# Patient Record
Sex: Male | Born: 1955 | Race: Black or African American | Hispanic: No | Marital: Single | Smoking: Never smoker
Health system: Southern US, Community
[De-identification: ages and names within clinical notes are randomized; demographics above are authoritative.]

## PROBLEM LIST (undated history)

## (undated) DIAGNOSIS — K5909 Other constipation: Secondary | ICD-10-CM

## (undated) DIAGNOSIS — F209 Schizophrenia, unspecified: Secondary | ICD-10-CM

## (undated) DIAGNOSIS — N189 Chronic kidney disease, unspecified: Secondary | ICD-10-CM

## (undated) DIAGNOSIS — I639 Cerebral infarction, unspecified: Secondary | ICD-10-CM

## (undated) DIAGNOSIS — D649 Anemia, unspecified: Secondary | ICD-10-CM

## (undated) DIAGNOSIS — E785 Hyperlipidemia, unspecified: Secondary | ICD-10-CM

## (undated) DIAGNOSIS — I10 Essential (primary) hypertension: Secondary | ICD-10-CM

## (undated) DIAGNOSIS — E119 Type 2 diabetes mellitus without complications: Secondary | ICD-10-CM

## (undated) DIAGNOSIS — Z992 Dependence on renal dialysis: Secondary | ICD-10-CM

## (undated) DIAGNOSIS — I619 Nontraumatic intracerebral hemorrhage, unspecified: Secondary | ICD-10-CM

## (undated) HISTORY — PX: VASCULAR SURGERY: SHX849

## (undated) SURGERY — Surgical Case
Anesthesia: *Unknown

---

## 2010-12-08 DIAGNOSIS — I619 Nontraumatic intracerebral hemorrhage, unspecified: Secondary | ICD-10-CM

## 2010-12-08 HISTORY — DX: Nontraumatic intracerebral hemorrhage, unspecified: I61.9

## 2013-04-19 DIAGNOSIS — E1169 Type 2 diabetes mellitus with other specified complication: Secondary | ICD-10-CM | POA: Insufficient documentation

## 2013-04-19 DIAGNOSIS — I10 Essential (primary) hypertension: Secondary | ICD-10-CM | POA: Insufficient documentation

## 2013-04-19 DIAGNOSIS — E785 Hyperlipidemia, unspecified: Secondary | ICD-10-CM | POA: Insufficient documentation

## 2013-04-19 DIAGNOSIS — E119 Type 2 diabetes mellitus without complications: Secondary | ICD-10-CM | POA: Insufficient documentation

## 2013-04-19 DIAGNOSIS — I639 Cerebral infarction, unspecified: Secondary | ICD-10-CM | POA: Insufficient documentation

## 2013-05-12 DIAGNOSIS — D649 Anemia, unspecified: Secondary | ICD-10-CM | POA: Insufficient documentation

## 2013-05-12 DIAGNOSIS — E559 Vitamin D deficiency, unspecified: Secondary | ICD-10-CM | POA: Insufficient documentation

## 2014-08-02 DIAGNOSIS — Z1211 Encounter for screening for malignant neoplasm of colon: Secondary | ICD-10-CM | POA: Insufficient documentation

## 2014-08-25 ENCOUNTER — Ambulatory Visit: Payer: Self-pay | Admitting: Gastroenterology

## 2014-12-13 ENCOUNTER — Ambulatory Visit: Payer: Self-pay | Admitting: Nephrology

## 2014-12-13 LAB — URINALYSIS, COMPLETE
Bacteria: NONE SEEN
Bilirubin,UR: NEGATIVE
Blood: NEGATIVE
Glucose,UR: 50 mg/dL (ref 0–75)
Ketone: NEGATIVE
Leukocyte Esterase: NEGATIVE
Nitrite: NEGATIVE
PH: 6 (ref 4.5–8.0)
Protein: 100
RBC,UR: 1 /HPF (ref 0–5)
Specific Gravity: 1.006 (ref 1.003–1.030)
WBC UR: 1 /HPF (ref 0–5)

## 2014-12-13 LAB — COMPREHENSIVE METABOLIC PANEL
ALT: 21 U/L
ANION GAP: 7 (ref 7–16)
AST: 17 U/L (ref 15–37)
Albumin: 2.5 g/dL — ABNORMAL LOW (ref 3.4–5.0)
Alkaline Phosphatase: 50 U/L
BILIRUBIN TOTAL: 0.2 mg/dL (ref 0.2–1.0)
BUN: 51 mg/dL — ABNORMAL HIGH (ref 7–18)
CHLORIDE: 105 mmol/L (ref 98–107)
CO2: 25 mmol/L (ref 21–32)
CREATININE: 4.61 mg/dL — AB (ref 0.60–1.30)
Calcium, Total: 8.4 mg/dL — ABNORMAL LOW (ref 8.5–10.1)
EGFR (Non-African Amer.): 14 — ABNORMAL LOW
GFR CALC AF AMER: 17 — AB
Glucose: 114 mg/dL — ABNORMAL HIGH (ref 65–99)
Osmolality: 288 (ref 275–301)
POTASSIUM: 4 mmol/L (ref 3.5–5.1)
Sodium: 137 mmol/L (ref 136–145)
Total Protein: 5.8 g/dL — ABNORMAL LOW (ref 6.4–8.2)

## 2014-12-13 LAB — CBC WITH DIFFERENTIAL/PLATELET
BASOS ABS: 0 10*3/uL (ref 0.0–0.1)
BASOS PCT: 0.9 %
Eosinophil #: 0.2 10*3/uL (ref 0.0–0.7)
Eosinophil %: 3.8 %
HCT: 29.1 % — ABNORMAL LOW (ref 40.0–52.0)
HGB: 9.4 g/dL — AB (ref 13.0–18.0)
LYMPHS PCT: 29.4 %
Lymphocyte #: 1.4 10*3/uL (ref 1.0–3.6)
MCH: 28.5 pg (ref 26.0–34.0)
MCHC: 32.2 g/dL (ref 32.0–36.0)
MCV: 88 fL (ref 80–100)
Monocyte #: 0.4 x10 3/mm (ref 0.2–1.0)
Monocyte %: 9.2 %
Neutrophil #: 2.8 10*3/uL (ref 1.4–6.5)
Neutrophil %: 56.7 %
Platelet: 105 10*3/uL — ABNORMAL LOW (ref 150–440)
RBC: 3.29 10*6/uL — ABNORMAL LOW (ref 4.40–5.90)
RDW: 14.8 % — ABNORMAL HIGH (ref 11.5–14.5)
WBC: 4.9 10*3/uL (ref 3.8–10.6)

## 2014-12-13 LAB — PROTIME-INR
INR: 1.1
Prothrombin Time: 14.5 secs (ref 11.5–14.7)

## 2014-12-13 LAB — PROTEIN / CREATININE RATIO, URINE
CREATININE, URINE: 61.2 mg/dL (ref 30.0–125.0)
PROTEIN/CREAT. RATIO: 3660 mg/g{creat} — AB (ref 0–200)
Protein, Random Urine: 224 mg/dL — ABNORMAL HIGH (ref 0–12)

## 2014-12-13 LAB — APTT: ACTIVATED PTT: 27.3 s (ref 23.6–35.9)

## 2014-12-15 ENCOUNTER — Observation Stay: Payer: Self-pay | Admitting: Nephrology

## 2014-12-15 ENCOUNTER — Ambulatory Visit: Payer: Self-pay | Admitting: Nephrology

## 2014-12-15 LAB — CBC WITH DIFFERENTIAL/PLATELET
Basophil #: 0 10*3/uL (ref 0.0–0.1)
Basophil %: 0.9 %
EOS PCT: 4.2 %
Eosinophil #: 0.2 10*3/uL (ref 0.0–0.7)
HCT: 27.3 % — ABNORMAL LOW (ref 40.0–52.0)
HGB: 8.9 g/dL — AB (ref 13.0–18.0)
Lymphocyte #: 1.6 10*3/uL (ref 1.0–3.6)
Lymphocyte %: 36.5 %
MCH: 28.3 pg (ref 26.0–34.0)
MCHC: 32.5 g/dL (ref 32.0–36.0)
MCV: 87 fL (ref 80–100)
MONOS PCT: 6.4 %
Monocyte #: 0.3 x10 3/mm (ref 0.2–1.0)
Neutrophil #: 2.3 10*3/uL (ref 1.4–6.5)
Neutrophil %: 52 %
Platelet: 93 10*3/uL — ABNORMAL LOW (ref 150–440)
RBC: 3.13 10*6/uL — AB (ref 4.40–5.90)
RDW: 14.5 % (ref 11.5–14.5)
WBC: 4.5 10*3/uL (ref 3.8–10.6)

## 2015-01-21 DIAGNOSIS — I10 Essential (primary) hypertension: Secondary | ICD-10-CM

## 2015-01-21 DIAGNOSIS — E119 Type 2 diabetes mellitus without complications: Secondary | ICD-10-CM | POA: Insufficient documentation

## 2015-01-21 DIAGNOSIS — I129 Hypertensive chronic kidney disease with stage 1 through stage 4 chronic kidney disease, or unspecified chronic kidney disease: Secondary | ICD-10-CM | POA: Insufficient documentation

## 2015-01-21 DIAGNOSIS — N184 Chronic kidney disease, stage 4 (severe): Secondary | ICD-10-CM | POA: Insufficient documentation

## 2015-02-09 ENCOUNTER — Ambulatory Visit
Admit: 2015-02-09 | Disposition: A | Discharge status: Home / Self Care | Payer: Self-pay | Attending: Internal Medicine | Admitting: Internal Medicine

## 2015-02-17 DIAGNOSIS — N051 Unspecified nephritic syndrome with focal and segmental glomerular lesions: Secondary | ICD-10-CM | POA: Insufficient documentation

## 2015-02-26 DIAGNOSIS — N051 Unspecified nephritic syndrome with focal and segmental glomerular lesions: Secondary | ICD-10-CM

## 2015-02-27 DIAGNOSIS — E010 Iodine-deficiency related diffuse (endemic) goiter: Secondary | ICD-10-CM

## 2015-02-27 DIAGNOSIS — E049 Nontoxic goiter, unspecified: Secondary | ICD-10-CM | POA: Insufficient documentation

## 2015-02-27 DIAGNOSIS — R682 Dry mouth, unspecified: Secondary | ICD-10-CM | POA: Insufficient documentation

## 2015-03-06 ENCOUNTER — Ambulatory Visit: Admit: 2015-03-06 | Disposition: A | Discharge status: Home / Self Care | Payer: Self-pay | Admitting: Family Medicine

## 2015-03-06 ENCOUNTER — Ambulatory Visit: Payer: Self-pay | Admitting: Neurology

## 2015-03-09 ENCOUNTER — Ambulatory Visit
Admit: 2015-03-09 | Disposition: A | Discharge status: Home / Self Care | Payer: Self-pay | Attending: Internal Medicine | Admitting: Internal Medicine

## 2015-03-21 ENCOUNTER — Ambulatory Visit
Admit: 2015-03-21 | Disposition: A | Discharge status: Home / Self Care | Payer: Self-pay | Attending: Vascular Surgery | Admitting: Vascular Surgery

## 2015-03-21 DIAGNOSIS — I10 Essential (primary) hypertension: Secondary | ICD-10-CM | POA: Diagnosis not present

## 2015-03-21 LAB — CBC
HCT: 24.8 % — ABNORMAL LOW (ref 40.0–52.0)
HGB: 8 g/dL — ABNORMAL LOW (ref 13.0–18.0)
MCH: 29.1 pg (ref 26.0–34.0)
MCHC: 32.4 g/dL (ref 32.0–36.0)
MCV: 90 fL (ref 80–100)
Platelet: 93 10*3/uL — ABNORMAL LOW (ref 150–440)
RBC: 2.76 10*6/uL — ABNORMAL LOW (ref 4.40–5.90)
RDW: 14.3 % (ref 11.5–14.5)
WBC: 5.3 10*3/uL (ref 3.8–10.6)

## 2015-03-21 LAB — BASIC METABOLIC PANEL
ANION GAP: 9 (ref 7–16)
BUN: 64 mg/dL — ABNORMAL HIGH
CO2: 36 mmol/L — AB
Calcium, Total: 8.5 mg/dL — ABNORMAL LOW
Chloride: 95 mmol/L — ABNORMAL LOW
Creatinine: 7.71 mg/dL — ABNORMAL HIGH
EGFR (African American): 8 — ABNORMAL LOW
EGFR (Non-African Amer.): 7 — ABNORMAL LOW
Glucose: 117 mg/dL — ABNORMAL HIGH
Potassium: 4 mmol/L
Sodium: 140 mmol/L

## 2015-03-29 ENCOUNTER — Ambulatory Visit
Admit: 2015-03-29 | Disposition: A | Discharge status: Home / Self Care | Payer: Self-pay | Attending: Vascular Surgery | Admitting: Vascular Surgery

## 2015-04-02 LAB — SURGICAL PATHOLOGY

## 2015-04-08 NOTE — Op Note (Signed)
PATIENT NAME:  Lawrence [name], GROSSE MR#:  [id number] DATE OF BIRTH:  [birth year]  DATE OF PROCEDURE:  03/29/2015  PREOPERATIVE DIAGNOSES:  1. 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  2. Diabetes.  3. Hypertension.  4. History of stroke.   POSTOPERATIVE DIAGNOSES: 1. 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  2. Diabetes.  3. Hypertension.  4. History of stroke.   PROCEDURE:  Left brachiocephalic arteriovenous fistula creation.   SURGEON:  Algernon Huxley, MD.   ANESTHESIA:  General.   ESTIMATED BLOOD LOSS:  25 mL.   INDICATION FOR PROCEDURE:  This is a gentleman with stage V chronic kidney disease nearing dialysis dependence.  He was referred to our office.  He was found to have adequate vein at the antecubital fossa on his nondominant arm to create an AV fistula.  Risks and benefits were discussed.  Informed consent was obtained.   DESCRIPTION OF PROCEDURE:  The patient was brought to the operative suite, and after an adequate level of general anesthesia was obtained, the left upper extremity was sterilely prepped and draped and a sterile surgical field was created.  A curvilinear incision was created at the antecubital fossa.  He had a known high brachial bifurcation from his preoperative noninvasive studies.  Both arteries were similar size.  The more superficial artery was slightly larger and would be easier to create a fistula with, so we used this and it was impossible to tell from our dissection if this was the radial or ulnar artery but this was at the antecubital location.  The cephalic vein was dissected out.  This was actually quite lateral and had to be dissected several centimeters proximal with several vein branches ligated and divided between silk ties to help facilitate its exposure.  It also had to be dissected several centimeters more distal.  This was done with the help of the Senn retractor both proximally and distally to dissect out several  more centimeters of vein.  It was marked for orientation.  The patient was systemically heparinized.  It was ligated distally and a bulldog clamp was used for control.  It was flushed with heparinized saline.  Control was then pulled up on the vessel loops.  An antral arteriotomy was created with an 11 blade and extended with Potts scissors.  The anastomosis was then created with a running 6-0 Prolene suture in the usual fashion after cutting and beveling the vein to an appropriate size to match the arteriotomy.  Anastomosis required two 6-0 Prolene patch sutures for hemostasis and hemostasis was achieved.  There was some vasospasms, and so we used topical papaverine to help break the spasm resulting in a good thrill within the AV fistula. The wound was irrigated.  Surgicel and Evicel topical hemostatic agents were placed.  Hemostasis was complete.  The wound was then closed with 3-0 Vicryl and 4-0 Monocryl and Dermabond was placed as a dressing.  The patient was awakened from anesthesia and taken to the recovery room in stable condition, having tolerated the procedure well.    ____________________________ Algernon Huxley, MD jsd:kc D: 03/29/2015 15:58:19 ET T: 03/29/2015 16:33:47 ET JOB#: [id number]  cc: Algernon Huxley, MD, <Dictator> Algernon Huxley MD ELECTRONICALLY SIGNED 04/04/2015 13:52

## 2015-04-16 ENCOUNTER — Encounter: Payer: Self-pay | Admitting: *Deleted

## 2015-04-16 ENCOUNTER — Inpatient Hospital Stay
Admission: AD | Admit: 2015-04-16 | Discharge: 2015-04-23 | DRG: 682 | Disposition: A | Discharge status: Home / Self Care | Payer: Medicare Other | Source: Ambulatory Visit | Attending: Internal Medicine | Admitting: Internal Medicine

## 2015-04-16 DIAGNOSIS — E1121 Type 2 diabetes mellitus with diabetic nephropathy: Secondary | ICD-10-CM | POA: Diagnosis present

## 2015-04-16 DIAGNOSIS — J449 Chronic obstructive pulmonary disease, unspecified: Secondary | ICD-10-CM | POA: Diagnosis present

## 2015-04-16 DIAGNOSIS — I10 Essential (primary) hypertension: Secondary | ICD-10-CM | POA: Diagnosis present

## 2015-04-16 DIAGNOSIS — D631 Anemia in chronic kidney disease: Secondary | ICD-10-CM | POA: Diagnosis present

## 2015-04-16 DIAGNOSIS — N2581 Secondary hyperparathyroidism of renal origin: Secondary | ICD-10-CM | POA: Diagnosis present

## 2015-04-16 DIAGNOSIS — E559 Vitamin D deficiency, unspecified: Secondary | ICD-10-CM | POA: Diagnosis present

## 2015-04-16 DIAGNOSIS — I120 Hypertensive chronic kidney disease with stage 5 chronic kidney disease or end stage renal disease: Principal | ICD-10-CM | POA: Diagnosis present

## 2015-04-16 DIAGNOSIS — Z992 Dependence on renal dialysis: Secondary | ICD-10-CM

## 2015-04-16 DIAGNOSIS — Y92230 Patient room in hospital as the place of occurrence of the external cause: Secondary | ICD-10-CM

## 2015-04-16 DIAGNOSIS — Z833 Family history of diabetes mellitus: Secondary | ICD-10-CM

## 2015-04-16 DIAGNOSIS — N186 End stage renal disease: Secondary | ICD-10-CM | POA: Diagnosis present

## 2015-04-16 DIAGNOSIS — Z823 Family history of stroke: Secondary | ICD-10-CM

## 2015-04-16 DIAGNOSIS — E785 Hyperlipidemia, unspecified: Secondary | ICD-10-CM | POA: Diagnosis present

## 2015-04-16 DIAGNOSIS — Y838 Other surgical procedures as the cause of abnormal reaction of the patient, or of later complication, without mention of misadventure at the time of the procedure: Secondary | ICD-10-CM | POA: Diagnosis not present

## 2015-04-16 DIAGNOSIS — E1122 Type 2 diabetes mellitus with diabetic chronic kidney disease: Secondary | ICD-10-CM | POA: Diagnosis present

## 2015-04-16 DIAGNOSIS — Z8673 Personal history of transient ischemic attack (TIA), and cerebral infarction without residual deficits: Secondary | ICD-10-CM

## 2015-04-16 DIAGNOSIS — F209 Schizophrenia, unspecified: Secondary | ICD-10-CM | POA: Diagnosis present

## 2015-04-16 DIAGNOSIS — Z8249 Family history of ischemic heart disease and other diseases of the circulatory system: Secondary | ICD-10-CM

## 2015-04-16 DIAGNOSIS — I97618 Postprocedural hemorrhage and hematoma of a circulatory system organ or structure following other circulatory system procedure: Secondary | ICD-10-CM | POA: Diagnosis not present

## 2015-04-16 HISTORY — DX: Nontraumatic intracerebral hemorrhage, unspecified: I61.9

## 2015-04-16 HISTORY — DX: Hyperlipidemia, unspecified: E78.5

## 2015-04-16 HISTORY — DX: Chronic kidney disease, unspecified: N18.9

## 2015-04-16 HISTORY — DX: Anemia, unspecified: D64.9

## 2015-04-16 HISTORY — DX: Cerebral infarction, unspecified: I63.9

## 2015-04-16 HISTORY — DX: Type 2 diabetes mellitus without complications: E11.9

## 2015-04-16 HISTORY — DX: Essential (primary) hypertension: I10

## 2015-04-16 HISTORY — DX: Schizophrenia, unspecified: F20.9

## 2015-04-16 LAB — BASIC METABOLIC PANEL
ANION GAP: 9 (ref 5–15)
BUN: 87 mg/dL — ABNORMAL HIGH (ref 6–20)
CALCIUM: 8.7 mg/dL — AB (ref 8.9–10.3)
CO2: 24 mmol/L (ref 22–32)
Chloride: 105 mmol/L (ref 101–111)
Creatinine, Ser: 9.66 mg/dL — ABNORMAL HIGH (ref 0.61–1.24)
GFR calc non Af Amer: 5 mL/min — ABNORMAL LOW (ref >60)
GFR, EST AFRICAN AMERICAN: 6 mL/min — AB (ref >60)
Glucose, Bld: 154 mg/dL — ABNORMAL HIGH (ref 65–99)
Potassium: 4.1 mmol/L (ref 3.5–5.1)
Sodium: 138 mmol/L (ref 135–145)

## 2015-04-16 LAB — CBC
HCT: 23.2 % — ABNORMAL LOW (ref 40.0–52.0)
Hemoglobin: 7.8 g/dL — ABNORMAL LOW (ref 13.0–18.0)
MCH: 30.5 pg (ref 26.0–34.0)
MCHC: 33.6 g/dL (ref 32.0–36.0)
MCV: 90.6 fL (ref 80.0–100.0)
PLATELETS: 110 10*3/uL — AB (ref 150–440)
RBC: 2.56 MIL/uL — ABNORMAL LOW (ref 4.40–5.90)
RDW: 15.2 % — AB (ref 11.5–14.5)
WBC: 4.1 10*3/uL (ref 3.8–10.6)

## 2015-04-16 MED ORDER — NIACIN ER (ANTIHYPERLIPIDEMIC) 1000 MG PO TBCR
1000.0000 mg | EXTENDED_RELEASE_TABLET | Freq: Every day | ORAL | Status: DC
Start: 2015-04-16 — End: 2015-04-16
  Filled 2015-04-16 (×3): qty 1

## 2015-04-16 MED ORDER — LORATADINE 10 MG PO TABS
10.0000 mg | ORAL_TABLET | Freq: Every day | ORAL | Status: DC
  Administered 2015-04-16 – 2015-04-22 (×7): 10 mg via ORAL
  Filled 2015-04-16 (×7): qty 1

## 2015-04-16 MED ORDER — CYCLOBENZAPRINE HCL 10 MG PO TABS: 10.0000 mg | ORAL_TABLET | Freq: Three times a day (TID) | ORAL | Status: DC | PRN

## 2015-04-16 MED ORDER — ALBUTEROL SULFATE (2.5 MG/3ML) 0.083% IN NEBU: 2.5000 mg | INHALATION_SOLUTION | RESPIRATORY_TRACT | Status: DC | PRN

## 2015-04-16 MED ORDER — ONDANSETRON HCL 4 MG/2ML IJ SOLN
4.0000 mg | Freq: Four times a day (QID) | INTRAMUSCULAR | Status: DC | PRN
  Administered 2015-04-17: 4 mg via INTRAVENOUS
  Filled 2015-04-16: qty 2

## 2015-04-16 MED ORDER — HEPARIN SODIUM (PORCINE) 5000 UNIT/ML IJ SOLN
5000.0000 [IU] | Freq: Three times a day (TID) | INTRAMUSCULAR | Status: DC
  Administered 2015-04-16 – 2015-04-17 (×2): 5000 [IU] via SUBCUTANEOUS
  Filled 2015-04-16 (×3): qty 1

## 2015-04-16 MED ORDER — ISOSORBIDE MONONITRATE ER 60 MG PO TB24
90.0000 mg | ORAL_TABLET | Freq: Every day | ORAL | Status: DC
  Administered 2015-04-16 – 2015-04-23 (×8): 90 mg via ORAL
  Filled 2015-04-16 (×8): qty 1

## 2015-04-16 MED ORDER — ONDANSETRON HCL 4 MG PO TABS
4.0000 mg | ORAL_TABLET | Freq: Four times a day (QID) | ORAL | Status: DC | PRN
  Filled 2015-04-16: qty 1

## 2015-04-16 MED ORDER — DOCUSATE SODIUM 100 MG PO CAPS
100.0000 mg | ORAL_CAPSULE | Freq: Two times a day (BID) | ORAL | Status: DC
  Administered 2015-04-16 – 2015-04-23 (×14): 100 mg via ORAL
  Filled 2015-04-16 (×14): qty 1

## 2015-04-16 MED ORDER — BENZTROPINE MESYLATE 1 MG PO TABS
0.5000 mg | ORAL_TABLET | Freq: Two times a day (BID) | ORAL | Status: DC
  Administered 2015-04-16 – 2015-04-23 (×14): 0.5 mg via ORAL
  Filled 2015-04-16 (×13): qty 1

## 2015-04-16 MED ORDER — NIACIN ER (ANTIHYPERLIPIDEMIC) 500 MG PO TBCR
1000.0000 mg | EXTENDED_RELEASE_TABLET | Freq: Every day | ORAL | Status: DC
  Administered 2015-04-16 – 2015-04-22 (×5): 1000 mg via ORAL
  Filled 2015-04-16 (×14): qty 2

## 2015-04-16 MED ORDER — CLONIDINE HCL 0.1 MG PO TABS
0.3000 mg | ORAL_TABLET | Freq: Two times a day (BID) | ORAL | Status: DC
  Administered 2015-04-16: 0.3 mg via ORAL
  Filled 2015-04-16: qty 3

## 2015-04-16 MED ORDER — ASPIRIN EC 81 MG PO TBEC
81.0000 mg | DELAYED_RELEASE_TABLET | Freq: Every day | ORAL | Status: DC
  Administered 2015-04-17 – 2015-04-23 (×7): 81 mg via ORAL
  Filled 2015-04-16 (×7): qty 1

## 2015-04-16 MED ORDER — ACETAMINOPHEN 325 MG PO TABS: 650.0000 mg | ORAL_TABLET | Freq: Four times a day (QID) | ORAL | Status: DC | PRN

## 2015-04-16 MED ORDER — CARVEDILOL 25 MG PO TABS
25.0000 mg | ORAL_TABLET | Freq: Two times a day (BID) | ORAL | Status: DC
  Administered 2015-04-17 – 2015-04-23 (×10): 25 mg via ORAL
  Filled 2015-04-16 (×12): qty 1

## 2015-04-16 MED ORDER — BENZONATATE 100 MG PO CAPS
100.0000 mg | ORAL_CAPSULE | Freq: Three times a day (TID) | ORAL | Status: DC | PRN
  Administered 2015-04-21: 100 mg via ORAL
  Filled 2015-04-16 (×2): qty 1

## 2015-04-16 MED ORDER — ACETAMINOPHEN 650 MG RE SUPP: 650.0000 mg | Freq: Four times a day (QID) | RECTAL | Status: DC | PRN

## 2015-04-16 MED ORDER — HYDROCODONE-ACETAMINOPHEN 5-325 MG PO TABS
1.0000 | ORAL_TABLET | ORAL | Status: DC | PRN
  Administered 2015-04-17: 1 via ORAL
  Filled 2015-04-16: qty 1

## 2015-04-16 MED ORDER — HYDRALAZINE HCL 100 MG PO TABS
150.0000 mg | ORAL_TABLET | Freq: Two times a day (BID) | ORAL | Status: DC
  Administered 2015-04-16: 150 mg via ORAL
  Filled 2015-04-16 (×3): qty 1.5

## 2015-04-16 NOTE — Progress Notes (Signed)
  [facility]  ROUNDING NOTE   Subjective:   Patient known to our practice from outpatient Admitted for permcath placement and to initiate dialysis Eating dinner when seen   Objective:  Vital signs in last 24 hours:  Temp:  [98.1 F (36.7 C)] 98.1 F (36.7 C) (05/09 1806) Pulse Rate:  [88] 88 (05/09 1806) Resp:  [18] 18 (05/09 1721) BP: (154)/(79) 154/79 mmHg (05/09 1721) SpO2:  [100 %] 100 % (05/09 1806) Weight:  [82.146 kg (181 lb 1.6 oz)] 82.146 kg (181 lb 1.6 oz) (05/09 1805)  Weight change:  Filed Weights   04/16/15 1805  Weight: 82.146 kg (181 lb 1.6 oz)    Intake/Output:     Intake/Output this shift:     Physical Exam: General: NAD,   Head: Normocephalic, atraumatic. Moist oral mucosal membranes  Eyes: Anicteric,  Neck: Supple, trachea midline  Lungs:  Clear to auscultation  Heart: Regular rate and rhythm  Abdomen:  Soft, nontender,   Extremities:  no peripheral edema.  Neurologic: Nonfocal, moving all four extremities  Skin: No lesions  Access: None, developing AVF    Basic Metabolic Panel:  Recent Labs Lab 04/16/15 1755  NA 138  K 4.1  CL 105  CO2 24  GLUCOSE 154*  BUN 87*  CREATININE 9.66*  CALCIUM 8.7*    Liver Function Tests: No results for input(s): AST, ALT, ALKPHOS, BILITOT, PROT, ALBUMIN in the last 168 hours. No results for input(s): LIPASE, AMYLASE in the last 168 hours. No results for input(s): AMMONIA in the last 168 hours.  CBC:  Recent Labs Lab 04/16/15 1755  WBC 4.1  HGB 7.8*  HCT 23.2*  MCV 90.6  PLT 110*    Cardiac Enzymes: No results for input(s): CKTOTAL, CKMB, CKMBINDEX, TROPONINI in the last 168 hours.  BNP: Invalid input(s): POCBNP  CBG: No results for input(s): GLUCAP in the last 168 hours.  Microbiology: No results found for this or any previous visit.  Coagulation Studies: No results for input(s): LABPROT, INR in the last 72 hours.  Urinalysis: No results for input(s):  COLORURINE, LABSPEC, PHURINE, GLUCOSEU, HGBUR, BILIRUBINUR, KETONESUR, PROTEINUR, UROBILINOGEN, NITRITE, LEUKOCYTESUR in the last 72 hours.  Invalid input(s): APPERANCEUR    Imaging: No results found.   Medications:     . [START ON 04/17/2015] aspirin EC  81 mg Oral Daily  . benztropine  0.5 mg Oral BID  . [START ON 04/17/2015] carvedilol  25 mg Oral BID WC  . cloNIDine  0.3 mg Oral BID  . docusate sodium  100 mg Oral BID  . heparin  5,000 Units Subcutaneous 3 times per day  . hydrALAZINE  150 mg Oral BID  . isosorbide mononitrate  90 mg Oral Daily  . loratadine  10 mg Oral QHS  . niacin  1,000 mg Oral QHS   acetaminophen **OR** acetaminophen, albuterol, benzonatate, cyclobenzaprine, HYDROcodone-acetaminophen, ondansetron **OR** ondansetron (ZOFRAN) IV  Assessment/ Plan:  59 year old African American male with past medical history of diabetes mellitus, hypertension, history of CVA, hyperlipidemia, chronic kidney disease stage IV, anemia chronic kidney disease, vitamin D deficiency, and chronic schizoaffective disorder   1. ESRD 2. Anemia of CKD.  3. Proteinuria. 4, Hypertension. 5. 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 Secondary hyperparathyroidism.  NPO in AM in case there is opening for Permcath placement This was discussed with patient and the caretaker in the room Initiate dialysis once access is available   LOS: 0 Lawrence [name] 5/9/201610:00 PM

## 2015-04-16 NOTE — Care Management Note (Signed)
Based on Dr. Doy Hutching recommendation, Dr. Bridgett Larsson to change order from Inpatient to observation.  Medicare observation letter Code 44 given and reviewed with patient

## 2015-04-16 NOTE — H&P (Signed)
[facility] [place] NAME: Lawrence [name]    MR#:  [id number]  DATE OF BIRTH:  17-Jan-1956  DATE OF ADMISSION:  04/16/2015  PRIMARY CARE PHYSICIAN: Sharyne Peach, MD   REQUESTING/REFERRING PHYSICIAN: Dr. Holley Raring.  CHIEF COMPLAINT:  No chief complaint on file.  ESRD need new hemodialysis  HISTORY OF PRESENT ILLNESS:  Lawrence [name]  is a 59 y.o. male with a known history of hypertension, diabetes, CK D and stroke. The patient was from assisted-living sent to the hospital for direct admission by Dr. Holley Raring due to COPD progressing to ESRD and the need to [facility] dialysis. Patient only complains of a decreased appetite and mild weakness. He denies any other symptoms.  PAST MEDICAL HISTORY:   Past Medical History  Diagnosis Date  . Schizophrenia   . Cerebral hemorrhage 2012  . Stroke   . Diabetes mellitus without complication   . Hypertension   . Chronic kidney disease   . Anemia   . Hyperlipidemia     PAST SURGICAL HISTORY:   Past Surgical History  Procedure Laterality Date  . Vascular surgery      Fistula placement    SOCIAL HISTORY:   History  Substance Use Topics  . Smoking status: Never Smoker   . Smokeless tobacco: Never Used  . Alcohol Use: No    FAMILY HISTORY:   Family History  Problem Relation Age of Onset  . Diabetes Mother   . Kidney cancer Mother   . Diabetes Sister   . Stroke Brother     DRUG ALLERGIES:  No Known Allergies  REVIEW OF SYSTEMS:  CONSTITUTIONAL: No fever, decreased appetite and a mild weakness.  EYES: No blurred or double vision.  EARS, NOSE, AND THROAT: No tinnitus or ear pain.  RESPIRATORY: No cough, shortness of breath, wheezing or hemoptysis.  CARDIOVASCULAR: No chest pain, orthopnea, edema.  GASTROINTESTINAL: No nausea, vomiting, diarrhea or abdominal pain.  GENITOURINARY: No dysuria, hematuria.  ENDOCRINE: No polyuria, nocturia,  HEMATOLOGY: No anemia, easy bruising or  bleeding SKIN: No rash or lesion. MUSCULOSKELETAL: No joint pain or arthritis.   NEUROLOGIC: No tingling, numbness, weakness.  PSYCHIATRY: No anxiety or depression.   MEDICATIONS AT HOME:   Prior to Admission medications   Not on File      VITAL SIGNS:  Blood pressure 154/79, pulse 88, temperature 98.1 F (36.7 C), temperature source Oral, resp. rate 18, height 5\' 9"  (1.753 m), weight 82.146 kg (181 lb 1.6 oz), SpO2 100 %.  PHYSICAL EXAMINATION:  GENERAL:  59 y.o.-year-old patient lying in the bed with no acute distress.  EYES: Pupils equal, round, reactive to light and accommodation. No scleral icterus. Extraocular muscles intact.  HEENT: Head atraumatic, normocephalic. Oropharynx and nasopharynx clear.  NECK:  Supple, no jugular venous distention. No thyroid enlargement, no tenderness.  LUNGS: Normal breath sounds bilaterally, no wheezing, rales,rhonchi or crepitation. No use of accessory muscles of respiration.  CARDIOVASCULAR: S1, S2 normal. No murmurs, rubs, or gallops.  ABDOMEN: Soft, nontender, nondistended. Bowel sounds present. No organomegaly or mass.  EXTREMITIES: No pedal edema, cyanosis, or clubbing.  NEUROLOGIC: Cranial nerves II through XII are intact. Muscle strength 5/5 in all extremities. Sensation intact. Gait not checked.  PSYCHIATRIC: The patient is alert and oriented x 3.  SKIN: No obvious rash, lesion, or ulcer.   LABORATORY PANEL:   CBC  Recent Labs Lab 04/16/15 1755  WBC 4.1  HGB 7.8*  HCT 23.2*  PLT 110*   ------------------------------------------------------------------------------------------------------------------  Chemistries  No results for input(s): NA, K, CL, CO2, GLUCOSE, BUN, CREATININE, CALCIUM, MG, AST, ALT, ALKPHOS, BILITOT in the last 168 hours.  Invalid input(s): GFRCGP ------------------------------------------------------------------------------------------------------------------  Cardiac Enzymes No results for input(s):  TROPONINI in the last 168 hours. ------------------------------------------------------------------------------------------------------------------  RADIOLOGY:  No results found.  EKG:  No orders found for this or any previous visit.  IMPRESSION AND PLAN:  I will place patient for observation. ESRD. Follow up with nephrologist and vascular surgeon for Perma-Caths and hemodialysis. Hypertension. Continue patient to home hypertension medications. Diabetes. I will start a sliding scale, but hold patient to by mouth diabetes medications. Anemia of chronic disease. Follow-up labs hemoglobin. May need to have routine during hemodialysis. Thrombocytopenia. Unclear ideology, follow-up CBC.     All the records are reviewed and case discussed with Dr. Holley Raring and Dr. Candiss Norse.  Management plans discussed with the patient, assist in relieving director and they are in agreement.  CODE STATUS: Full code.  TOTAL TIME TAKING CARE OF THIS PATIENT: 53 minutes.    Demetrios Loll M.D on 04/16/2015 at 6:11 PM  Between 7am to 6pm - Pager - [phone]  After 6pm go to [email or web address] - password [email or web address]  Office  [phone]  CC: Primary care physician; Sharyne Peach, MD

## 2015-04-17 ENCOUNTER — Encounter: Payer: Self-pay | Admitting: *Deleted

## 2015-04-17 ENCOUNTER — Encounter
Admission: AD | Disposition: A | Discharge status: Home / Self Care | Payer: Medicare Other | Source: Ambulatory Visit | Attending: Internal Medicine

## 2015-04-17 HISTORY — PX: PERIPHERAL VASCULAR CATHETERIZATION: SHX172C

## 2015-04-17 LAB — CBC
HCT: 22.2 % — ABNORMAL LOW (ref 40.0–52.0)
HCT: 21.4 % — ABNORMAL LOW (ref 40.0–52.0)
Hemoglobin: 7.3 g/dL — ABNORMAL LOW (ref 13.0–18.0)
Hemoglobin: 7.2 g/dL — ABNORMAL LOW (ref 13.0–18.0)
MCH: 29.9 pg (ref 26.0–34.0)
MCH: 30.5 pg (ref 26.0–34.0)
MCHC: 33 g/dL (ref 32.0–36.0)
MCHC: 33.5 g/dL (ref 32.0–36.0)
MCV: 90.7 fL (ref 80.0–100.0)
MCV: 91.1 fL (ref 80.0–100.0)
PLATELETS: 101 10*3/uL — AB (ref 150–440)
Platelets: 101 10*3/uL — ABNORMAL LOW (ref 150–440)
RBC: 2.45 MIL/uL — AB (ref 4.40–5.90)
RBC: 2.35 MIL/uL — AB (ref 4.40–5.90)
RDW: 15.5 % — AB (ref 11.5–14.5)
RDW: 15.3 % — ABNORMAL HIGH (ref 11.5–14.5)
WBC: 4.3 10*3/uL (ref 3.8–10.6)
WBC: 4 10*3/uL (ref 3.8–10.6)

## 2015-04-17 LAB — BASIC METABOLIC PANEL
ANION GAP: 11 (ref 5–15)
BUN: 86 mg/dL — ABNORMAL HIGH (ref 6–20)
CALCIUM: 8.7 mg/dL — AB (ref 8.9–10.3)
CO2: 23 mmol/L (ref 22–32)
Chloride: 106 mmol/L (ref 101–111)
Creatinine, Ser: 9.5 mg/dL — ABNORMAL HIGH (ref 0.61–1.24)
GFR calc non Af Amer: 5 mL/min — ABNORMAL LOW (ref >60)
GFR, EST AFRICAN AMERICAN: 6 mL/min — AB (ref >60)
Glucose, Bld: 92 mg/dL (ref 65–99)
Potassium: 4.7 mmol/L (ref 3.5–5.1)
Sodium: 140 mmol/L (ref 135–145)

## 2015-04-17 LAB — MRSA PCR SCREENING: MRSA by PCR: NEGATIVE

## 2015-04-17 LAB — GLUCOSE, CAPILLARY
GLUCOSE-CAPILLARY: 98 mg/dL (ref 70–99)
Glucose-Capillary: 160 mg/dL — ABNORMAL HIGH (ref 70–99)
Glucose-Capillary: 166 mg/dL — ABNORMAL HIGH (ref 70–99)
Glucose-Capillary: 99 mg/dL (ref 70–99)

## 2015-04-17 SURGERY — DIALYSIS/PERMA CATHETER INSERTION
Anesthesia: Moderate Sedation

## 2015-04-17 MED ORDER — HEPARIN (PORCINE) IN NACL 2-0.9 UNIT/ML-% IJ SOLN
INTRAMUSCULAR | Status: AC
  Filled 2015-04-17: qty 500

## 2015-04-17 MED ORDER — MORPHINE SULFATE 2 MG/ML IJ SOLN
2.0000 mg | Freq: Once | INTRAMUSCULAR | Status: AC
  Administered 2015-04-17: 2 mg via INTRAVENOUS
  Filled 2015-04-17: qty 1

## 2015-04-17 MED ORDER — CLONIDINE HCL 0.1 MG PO TABS
0.1000 mg | ORAL_TABLET | Freq: Two times a day (BID) | ORAL | Status: DC
  Administered 2015-04-17 – 2015-04-22 (×11): 0.1 mg via ORAL
  Filled 2015-04-17 (×11): qty 1

## 2015-04-17 MED ORDER — LIDOCAINE HCL (PF) 1 % IJ SOLN
INTRAMUSCULAR | Status: AC
  Filled 2015-04-17: qty 10

## 2015-04-17 MED ORDER — CEFAZOLIN SODIUM 1-5 GM-% IV SOLN
INTRAVENOUS | Status: DC | PRN
  Administered 2015-04-17: 1 g via INTRAVENOUS

## 2015-04-17 MED ORDER — LIDOCAINE-EPINEPHRINE (PF) 1 %-1:200000 IJ SOLN
INTRAMUSCULAR | Status: AC
  Filled 2015-04-17: qty 30

## 2015-04-17 MED ORDER — INSULIN ASPART 100 UNIT/ML ~~LOC~~ SOLN
0.0000 [IU] | Freq: Three times a day (TID) | SUBCUTANEOUS | Status: DC
  Administered 2015-04-18: 3 [IU] via SUBCUTANEOUS
  Administered 2015-04-18: 1 [IU] via SUBCUTANEOUS
  Administered 2015-04-19: 2 [IU] via SUBCUTANEOUS
  Administered 2015-04-21 (×2): 1 [IU] via SUBCUTANEOUS
  Administered 2015-04-22: 2 [IU] via SUBCUTANEOUS
  Administered 2015-04-23: 1 [IU] via SUBCUTANEOUS
  Filled 2015-04-17: qty 1
  Filled 2015-04-17: qty 2
  Filled 2015-04-17: qty 1
  Filled 2015-04-17: qty 2
  Filled 2015-04-17: qty 3
  Filled 2015-04-17: qty 2

## 2015-04-17 MED ORDER — MIDAZOLAM HCL 2 MG/2ML IJ SOLN
INTRAMUSCULAR | Status: AC
  Filled 2015-04-17: qty 2

## 2015-04-17 MED ORDER — MIDAZOLAM HCL 5 MG/5ML IJ SOLN
INTRAMUSCULAR | Status: AC
  Filled 2015-04-17: qty 5

## 2015-04-17 MED ORDER — HYDRALAZINE HCL 50 MG PO TABS
50.0000 mg | ORAL_TABLET | Freq: Two times a day (BID) | ORAL | Status: DC
  Administered 2015-04-17 – 2015-04-20 (×7): 50 mg via ORAL
  Filled 2015-04-17 (×6): qty 1

## 2015-04-17 MED ORDER — HEPARIN SODIUM (PORCINE) 10000 UNIT/ML IJ SOLN
INTRAMUSCULAR | Status: AC
  Filled 2015-04-17: qty 1

## 2015-04-17 MED ORDER — SODIUM CHLORIDE 0.9 % IV SOLN
INTRAVENOUS | Status: DC
  Administered 2015-04-17 – 2015-04-21 (×2): via INTRAVENOUS

## 2015-04-17 MED ORDER — SODIUM CHLORIDE 0.9 % IV SOLN
20.0000 ug | Freq: Once | INTRAVENOUS | Status: AC
  Administered 2015-04-17: 20 ug via INTRAVENOUS
  Filled 2015-04-17: qty 5

## 2015-04-17 MED ORDER — CEFAZOLIN SODIUM 1-5 GM-% IV SOLN
1.0000 g | Freq: Once | INTRAVENOUS | Status: AC
  Administered 2015-04-17: 1 g via INTRAVENOUS
  Filled 2015-04-17: qty 50

## 2015-04-17 MED ORDER — MIDAZOLAM HCL 2 MG/2ML IJ SOLN
INTRAMUSCULAR | Status: DC | PRN
  Administered 2015-04-17: 2 mg via INTRAVENOUS
  Administered 2015-04-17: 3 mg via INTRAVENOUS
  Administered 2015-04-17: 1 mg via INTRAVENOUS

## 2015-04-17 MED ORDER — CEFAZOLIN SODIUM 1-5 GM-% IV SOLN
INTRAVENOUS | Status: AC
  Filled 2015-04-17: qty 50

## 2015-04-17 MED ORDER — INSULIN ASPART 100 UNIT/ML ~~LOC~~ SOLN: 0.0000 [IU] | Freq: Every day | SUBCUTANEOUS | Status: DC

## 2015-04-17 SURGICAL SUPPLY — 7 items
CATH PALINDROME REV 23CM (CATHETERS) ×3 IMPLANT
DERMABOND ADVANCED (GAUZE/BANDAGES/DRESSINGS) ×2
DERMABOND ADVANCED .7 DNX12 (GAUZE/BANDAGES/DRESSINGS) ×1 IMPLANT
PACK ANGIOGRAPHY (CUSTOM PROCEDURE TRAY) ×3 IMPLANT
SET INTRO CAPELLA COAXIAL (SET/KITS/TRAYS/PACK) ×3 IMPLANT
TOWEL OR 17X26 4PK STRL BLUE (TOWEL DISPOSABLE) ×3 IMPLANT
WIRE AMPLATZ SSTIFF .035X260CM (WIRE) ×3 IMPLANT

## 2015-04-17 NOTE — Op Note (Signed)
OPERATIVE NOTE   PROCEDURE: 1. Insertion of tunneled dialysis catheter right internal jugular approach.  PRE-OPERATIVE DIAGNOSIS: Acute on chronic renal insufficiency  POST-OPERATIVE DIAGNOSIS: Same  SURGEON: Katha Cabal M.D.  ANESTHESIA: Conscious sedation with 1% lidocaine local infiltration  ESTIMATED BLOOD LOSS: Minimal cc  CONTRAST USED:  None  FLUOROSCOPY TIME:    INDICATIONS:   Lawrence [name] is a 59 y.o. male who presents with worsening renal function. Nephrology is following the patient and hemodialysis will now be initiated. He therefore requires appropriate access and given the need for dialysis urgently tunneled catheter will be placed. Patient acknowledges and understands the need for upper extremity access will be worked out at a later date..  DESCRIPTION: After obtaining full informed written consent, the patient was positioned supine. The right neck and chest wall was prepped and draped in a sterile fashion. Ultrasound was placed in a sterile sleeve. Ultrasound was utilized to identify the internal jugular vein which is noted to be echolucent and compressible indicating patency. Images recorded for the permanent record. Under real-time visualization a Seldinger needle is inserted into the vein and the guidewires advanced without difficulty. Small counterincision was made at the wire insertion site. Dilators are passed over the wire and the tunneled dialysis catheter is fed into the central venous system without difficulty.  Under fluoroscopy the catheter tip positioned at the atrial caval junction. The catheter is then approximated to the chest wall and an exit site selected. 1% lidocaine is infiltrated in soft tissues at this level small incision is made and the tunneling device is then passed from the exit site to the neck counterincision. Catheter is then connected to the tunneling device and the catheter was pulled subcutaneously. It is then transected and the hub  assembly connected without difficulty. Both lumens aspirate and flush easily. After verification of smooth contour with proper tip position under fluoroscopy the catheter is packed with 5000 units of heparin per lumen.  Catheter secured to the skin of the chest with 0 silk. A sterile dressing is applied with a Biopatch.  COMPLICATIONS: None  CONDITION: Good  Katha Cabal, M.D. [facility]. Office:  [phone]   04/17/2015, 7:03 PM

## 2015-04-17 NOTE — Progress Notes (Signed)
[facility] [place] NAME: Wadsworth Urbanek    MR#:  [id number]  DATE OF BIRTH:  06-03-1956  SUBJECTIVE:  CHIEF COMPLAINT:  No chief complaint on file.  no complaint. Comfortable  REVIEW OF SYSTEMS:  CONSTITUTIONAL: No fever, fatigue or weakness.  EYES: No blurred or double vision.  EARS, NOSE, AND THROAT: No tinnitus or ear pain.  RESPIRATORY: No cough, shortness of breath, wheezing or hemoptysis.  CARDIOVASCULAR: No chest pain, orthopnea, edema.  GASTROINTESTINAL: No nausea, vomiting, diarrhea or abdominal pain.  GENITOURINARY: No dysuria, hematuria.  ENDOCRINE: No polyuria, nocturia,  HEMATOLOGY: No anemia, easy bruising or bleeding SKIN: No rash or lesion. MUSCULOSKELETAL: No joint pain or arthritis.   NEUROLOGIC: No tingling, numbness, weakness.  PSYCHIATRY: No anxiety or depression.   DRUG ALLERGIES:  No Known Allergies  VITALS:  Blood pressure 141/85, pulse 86, temperature 98.4 F (36.9 C), temperature source Oral, resp. rate 11, height 5\' 9"  (1.753 m), weight 82.146 kg (181 lb 1.6 oz), SpO2 99 %.  PHYSICAL EXAMINATION:  GENERAL:  59 y.o.-year-old patient lying in the bed with no acute distress.  EYES: Pupils equal, round, reactive to light and accommodation. No scleral icterus. Extraocular muscles intact.  HEENT: Head atraumatic, normocephalic. Oropharynx and nasopharynx clear.  NECK:  Supple, no jugular venous distention. No thyroid enlargement, no tenderness.  LUNGS: Normal breath sounds bilaterally, no wheezing, rales,rhonchi or crepitation. No use of accessory muscles of respiration.  CARDIOVASCULAR: S1, S2 normal. No murmurs, rubs, or gallops.  ABDOMEN: Soft, nontender, nondistended. Bowel sounds present. No organomegaly or mass.  EXTREMITIES: No pedal edema, cyanosis, or clubbing.  NEUROLOGIC: Cranial nerves II through XII are intact. Muscle strength 5/5 in all extremities. Sensation intact. Gait not checked.   PSYCHIATRIC: The patient is alert and oriented x 3.  SKIN: No obvious rash, lesion, or ulcer.    LABORATORY PANEL:   CBC  Recent Labs Lab 04/17/15 0527  WBC 4.3  HGB 7.3*  HCT 22.2*  PLT 101*   ------------------------------------------------------------------------------------------------------------------  Chemistries   Recent Labs Lab 04/17/15 0527  NA 140  K 4.7  CL 106  CO2 23  GLUCOSE 92  BUN 86*  CREATININE 9.50*  CALCIUM 8.7*   ------------------------------------------------------------------------------------------------------------------  Cardiac Enzymes No results for input(s): TROPONINI in the last 168 hours. ------------------------------------------------------------------------------------------------------------------  RADIOLOGY:  No results found.  EKG:  No orders found for this or any previous visit.  ASSESSMENT AND PLAN:   ESRD. Follow up with nephrologist and vascular surgeon for Perma-Caths and hemodialysis. Patient is prepared for dialysis Hypertension. Controlled. Continue patient to home hypertension medications. Diabetes. Continue sliding scale, but hold patient to by mouth diabetes medications. Anemia of chronic disease. Follow-up labs hemoglobin. May need to have routine during hemodialysis. Thrombocytopenia. Unclear ideology, follow-up CBC.       All the records are reviewed and case discussed with Care Management/Social Workerr. Management plans discussed with the patient, family and they are in agreement.  CODE STATUS: Full  TOTAL TIME TAKING CARE OF THIS PATIENT: 35 minutes.     Myrtis Ser M.D on 04/17/2015 at 5:12 PM  Between 7am to 6pm - Pager - [phone]  After 6pm go to [email or web address] - password [email or web address] [facility]  Office  [phone]  CC: Primary care physician; Sharyne Peach, MD

## 2015-04-17 NOTE — Progress Notes (Signed)
Spoke to Dr.Hower and requested pain meds for pt with pain at a 6. One time order for morphine

## 2015-04-17 NOTE — Progress Notes (Signed)
Patient check post catheter placement.  Patient is in bed at a 40 angle he has a moderate-sized hematoma of the catheter track. He has had some persistent oozing from the exit site. Dressing has been reinforced once.  The entire dressing is taken down hemostatic agent was placed around the catheter with drain sponges and then Elastoplast tape. Lawrence [name] communicated with Dr. Candiss Norse and we will plan to give DDAVP as well

## 2015-04-17 NOTE — Progress Notes (Signed)
BRIEF CONSULT  Patient is a 59 year old gentleman with acute on chronic renal disease will now require hemodialysis. There are no infectious sources at this time. His chronic renal failure has been going on for many months and has been followed by Dr. Candiss Norse. The timing will be for initiation of dialysis tomorrow. He is demonstrating moderate uremic symptoms of nausea poor appetite and lethargy.  Plan is for placement of a permacath today to the dialysis can be a mid-initiated. The risks and benefits been reviewed and the patient wishes to proceed.

## 2015-04-17 NOTE — Progress Notes (Signed)
Subjective:   Patient known to our practice from outpatient Admitted for permcath placement and to initiate dialysis Feels fair today. No acute c/o N/V or SOB  Objective:  Vital signs in last 24 hours:  Temp:  [98 F (36.7 C)-98.4 F (36.9 C)] 98.4 F (36.9 C) (05/10 1537) Pulse Rate:  [82-88] 86 (05/10 1537) Resp:  [11-19] 11 (05/10 1537) BP: (121-154)/(61-85) 141/85 mmHg (05/10 1537) SpO2:  [99 %-100 %] 99 % (05/10 0818) Weight:  [82.146 kg (181 lb 1.6 oz)] 82.146 kg (181 lb 1.6 oz) (05/09 1805)  Weight change:  Filed Weights   04/16/15 1805  Weight: 82.146 kg (181 lb 1.6 oz)    Intake/Output: I/O last 3 completed shifts: In: -  Out: 200 [Urine:200]   Intake/Output this shift:  Total I/O In: 0  Out: 250 [Urine:250]  Physical Exam: General: NAD,   Head: Normocephalic, atraumatic. Moist oral mucosal membranes  Eyes: Anicteric,  Neck: Supple, trachea midline  Lungs:  Clear to auscultation  Heart: Regular rate and rhythm  Abdomen:  Soft, nontender,   Extremities:  no peripheral edema.  Neurologic: Nonfocal, moving all four extremities  Skin: No lesions  Access: None, developing AVF    Basic Metabolic Panel:  Recent Labs Lab 04/16/15 1755 04/17/15 0527  NA 138 140  K 4.1 4.7  CL 105 106  CO2 24 23  GLUCOSE 154* 92  BUN 87* 86*  CREATININE 9.66* 9.50*  CALCIUM 8.7* 8.7*    Liver Function Tests: No results for input(s): AST, ALT, ALKPHOS, BILITOT, PROT, ALBUMIN in the last 168 hours. No results for input(s): LIPASE, AMYLASE in the last 168 hours. No results for input(s): AMMONIA in the last 168 hours.  CBC:  Recent Labs Lab 04/16/15 1755 04/17/15 0527  WBC 4.1 4.3  HGB 7.8* 7.3*  HCT 23.2* 22.2*  MCV 90.6 90.7  PLT 110* 101*    Cardiac Enzymes: No results for input(s): CKTOTAL, CKMB, CKMBINDEX, TROPONINI in the last 168 hours.  BNP: Invalid input(s): POCBNP  CBG:  Recent Labs Lab 04/17/15 0846 04/17/15 1138 04/17/15 1543   GLUCAP 99 98 166*    Microbiology: Results for orders placed or performed during the hospital encounter of 04/16/15  MRSA PCR Screening     Status: None   Collection Time: 04/16/15  7:09 PM  Result Value Ref Range Status   MRSA by PCR NEGATIVE NEGATIVE Final    Comment:        The GeneXpert MRSA Assay (FDA approved for NASAL specimens only), is one component of a comprehensive MRSA colonization surveillance program. It is not intended to diagnose MRSA infection nor to guide or monitor treatment for MRSA infections.     Coagulation Studies: No results for input(s): LABPROT, INR in the last 72 hours.  Urinalysis: No results for input(s): COLORURINE, LABSPEC, PHURINE, GLUCOSEU, HGBUR, BILIRUBINUR, KETONESUR, PROTEINUR, UROBILINOGEN, NITRITE, LEUKOCYTESUR in the last 72 hours.  Invalid input(s): APPERANCEUR    Imaging: No results found.   Medications:   . sodium chloride 20 mL/hr at 04/17/15 1553   . [MAR Hold] aspirin EC  81 mg Oral Daily  . [MAR Hold] benztropine  0.5 mg Oral BID  . [MAR Hold] carvedilol  25 mg Oral BID WC  . [MAR Hold] cloNIDine  0.1 mg Oral BID  . [MAR Hold] docusate sodium  100 mg Oral BID  . [MAR Hold] heparin  5,000 Units Subcutaneous 3 times per day  . [MAR Hold] hydrALAZINE  50 mg Oral BID  . [  MAR Hold] insulin aspart  0-5 Units Subcutaneous QHS  . [MAR Hold] insulin aspart  0-9 Units Subcutaneous TID WC  . [MAR Hold] isosorbide mononitrate  90 mg Oral Daily  . [MAR Hold] loratadine  10 mg Oral QHS  . [MAR Hold] niacin  1,000 mg Oral QHS   [MAR Hold] acetaminophen **OR** [MAR Hold] acetaminophen, [MAR Hold] albuterol, [MAR Hold] benzonatate, [MAR Hold] cyclobenzaprine, [MAR Hold] HYDROcodone-acetaminophen, [MAR Hold] ondansetron **OR** [MAR Hold] ondansetron (ZOFRAN) IV  Assessment/ Plan:  59 year old African American male with past medical history of diabetes mellitus, hypertension, history of CVA, hyperlipidemia, chronic kidney disease  stage IV, anemia chronic kidney disease, vitamin D deficiency, and chronic schizoaffective disorder   1. ESRD 2. Anemia of CKD.  3. Proteinuria. 4, Hypertension. 5. 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 Secondary hyperparathyroidism.  NPO in AM for Permcath placement Initiate dialysis once access is available - starting Tuesday   LOS: 1 Michael Walrath 5/10/20164:08 PM

## 2015-04-17 NOTE — Progress Notes (Signed)
Initial Nutrition Assessment  INTERVENTION:  Education: Briefly educated pt on renal nutrition therapy for starting HD. As pt is from facility, education was catered to pt needs. Pt reports knowing not to use salt shaker and to avoid certain fruits such as oranges and orange juice. RD left written materials in pt room that can be provided to facility on discharge for further information. Will review on follow if needed.   NUTRITION DIAGNOSIS:  Inadequate oral intake related to inability to eat as evidenced by NPO status.  GOAL:  Patient will meet greater than or equal to 90% of their needs  MONITOR:  Energy Intake: goal for diet progression s/p cath placement Electrolyte and renal Profile Anthropometrics Digestive System  REASON FOR ASSESSMENT:  Other (Comment), Malnutrition Screening Tool (RD Screen, diagnosis, Renal Diet)    ASSESSMENT:  Pt admitted with ESRD, to start HD per MD note. Pt currently NPO for possible premcath placement today.  PMHx: HTN, DM, CKD, stroke, schizophrenia, HLD  PO Intake: pt currently NPO. Per MD note, pt with decreased appetite PTA. Medications: Colace, Novolog  Electrolyte and Renal Profile:    Recent Labs Lab 04/16/15 1755 04/17/15 0527  BUN 87* 86*  CREATININE 9.66* 9.50*  NA 138 140  K 4.1 4.7   Glucose Profile:  Recent Labs  04/17/15 0846 04/17/15 1138  GLUCAP 99 98  Protein Profile: No results for input(s): ALBUMIN in the last 168 hours. Height:  Ht Readings from Last 1 Encounters:  04/16/15 5\' 9"  (1.753 m)    Weight:  Wt Readings from Last 1 Encounters:  04/16/15 181 lb 1.6 oz (82.146 kg)    Wt Readings from Last 10 Encounters:  04/16/15 181 lb 1.6 oz (82.146 kg)    BMI:  Body mass index is 26.73 kg/(m^2).    Unable to complete Nutrition-Focused physical exam at this time.   Estimated Nutritional Needs:  Kcal:  2146-2536kcals, BEE: 1625kcals, (IF 1.1-1.3)(AF 1.2)   Protein:  98-123g protein  (1.2-1.5g/kg)  Fluid:  UOP+1047mL  Diet Order:  Diet NPO time specified Except for: Ice Chips Diet renal/carb modified with fluid restriction Diet-HS Snack?: Nothing; Room service appropriate?: Yes; Fluid consistency:: Thin  EDUCATION NEEDS:  Education needs addressed   Intake/Output Summary (Last 24 hours) at 04/17/15 1233 Last data filed at 04/17/15 1226  Gross per 24 hour  Intake      0 ml  Output    450 ml  Net   -450 ml    Last BM:  5/7  [place], RD, LDN Pager [phone]

## 2015-04-17 NOTE — Progress Notes (Addendum)
Called for pt active bleeding from permacath site eval at bedside Multiple dressings soaked.  Placed surgicell,  desmopressin - ordered by nephro  Ordered cbc  Pressure dressing - place sandbag  Follow cbc - will transfuse if plt<50,  Hold heparin subcutaneous for now until controlled

## 2015-04-17 NOTE — Progress Notes (Signed)
Dr Candiss Norse said make pt NPO until lunch time and to call him back at lunch for further instruction.

## 2015-04-18 ENCOUNTER — Encounter: Payer: Self-pay | Admitting: Vascular Surgery

## 2015-04-18 LAB — GLUCOSE, CAPILLARY
Glucose-Capillary: 126 mg/dL — ABNORMAL HIGH (ref 70–99)
Glucose-Capillary: 208 mg/dL — ABNORMAL HIGH (ref 70–99)
Glucose-Capillary: 191 mg/dL — ABNORMAL HIGH (ref 70–99)
Glucose-Capillary: 180 mg/dL — ABNORMAL HIGH (ref 70–99)

## 2015-04-18 MED ORDER — SODIUM CHLORIDE 0.9 % IV SOLN: 100.0000 mL | INTRAVENOUS | Status: DC | PRN

## 2015-04-18 MED ORDER — LIDOCAINE HCL (PF) 1 % IJ SOLN
5.0000 mL | INTRAMUSCULAR | Status: DC | PRN
  Filled 2015-04-18: qty 5

## 2015-04-18 MED ORDER — ALTEPLASE 2 MG IJ SOLR
2.0000 mg | Freq: Once | INTRAMUSCULAR | Status: AC | PRN
  Filled 2015-04-18: qty 2

## 2015-04-18 MED ORDER — HEPARIN SODIUM (PORCINE) 1000 UNIT/ML DIALYSIS
1000.0000 [IU] | INTRAMUSCULAR | Status: DC | PRN
  Filled 2015-04-18: qty 1

## 2015-04-18 MED ORDER — LIDOCAINE-PRILOCAINE 2.5-2.5 % EX CREA
1.0000 | TOPICAL_CREAM | CUTANEOUS | Status: DC | PRN
Start: 2015-04-18 — End: 2015-04-23
  Filled 2015-04-18: qty 5

## 2015-04-18 NOTE — Progress Notes (Signed)
Presents from [facility]  with acute on chronic renal failure now requiring dialysis.  CSW aware. Following for discharge needs.

## 2015-04-18 NOTE — Progress Notes (Signed)
[place] [facility] [place] NAME: Lawrence [name]    MR#:  [id number]  DATE OF BIRTH:  [birth year]  SUBJECTIVE:  CHIEF COMPLAINT:   no complaint. Comfortable. Had significant bleeding from the newly inserted hemodialysis catheter in the right chest overnight. Bleeding has now stopped.  REVIEW OF SYSTEMS:  CONSTITUTIONAL: No fever, fatigue or weakness.  EYES: No blurred or double vision.  EARS, NOSE, AND THROAT: No tinnitus or ear pain.  RESPIRATORY: No cough, shortness of breath, wheezing or hemoptysis.  CARDIOVASCULAR: No chest pain, orthopnea, edema.  GASTROINTESTINAL: No nausea, vomiting, diarrhea or abdominal pain.  GENITOURINARY: No dysuria, hematuria.  ENDOCRINE: No polyuria, nocturia,  HEMATOLOGY: Significant bleeding overnight SKIN: No rash or lesion. MUSCULOSKELETAL: No joint pain or arthritis.   NEUROLOGIC: No tingling, numbness, weakness.  PSYCHIATRY: No anxiety or depression.   DRUG ALLERGIES:  No Known Allergies  VITALS:  Blood pressure 169/88, pulse 102, temperature 97.9 F (36.6 C), temperature source Oral, resp. rate 17, height 5\' 9"  (1.753 m), weight 82.146 kg (181 lb 1.6 oz), SpO2 98 %.  PHYSICAL EXAMINATION:  GENERAL:  59 y.o.-year-old patient lying in the bed with no acute distress.  EYES: Pupils equal, round, reactive to light and accommodation. No scleral icterus. Extraocular muscles intact.  HEENT: Head atraumatic, normocephalic. Oropharynx and nasopharynx clear.  NECK:  Supple, no jugular venous distention. No thyroid enlargement, no tenderness.  LUNGS: Normal breath sounds bilaterally, no wheezing, rales,rhonchi or crepitation. No use of accessory muscles of respiration.  CARDIOVASCULAR: S1, S2 normal. No murmurs, rubs, or gallops.  ABDOMEN: Soft, nontender, nondistended. Bowel sounds present. No organomegaly or mass.  EXTREMITIES: No pedal edema, cyanosis, or clubbing.  NEUROLOGIC: Cranial nerves II through  XII are intact. Muscle strength 5/5 in all extremities. Sensation intact. Gait not checked.  PSYCHIATRIC: The patient is alert and oriented x 3.  SKIN: Large compression bandage over the right chest   LABORATORY PANEL:   CBC  Recent Labs Lab 04/17/15 2212  WBC 4.0  HGB 7.2*  HCT 21.4*  PLT 101*   ------------------------------------------------------------------------------------------------------------------  Chemistries   Recent Labs Lab 04/17/15 0527  NA 140  K 4.7  CL 106  CO2 23  GLUCOSE 92  BUN 86*  CREATININE 9.50*  CALCIUM 8.7*   ------------------------------------------------------------------------------------------------------------------  Cardiac Enzymes No results for input(s): TROPONINI in the last 168 hours. ------------------------------------------------------------------------------------------------------------------  RADIOLOGY:  No results found.  EKG:  No orders found for this or any previous visit.  ASSESSMENT AND PLAN:   ESRD.  PermCath inserted yesterday Will initiate hemodialysis per nephrology recommendations Will need to establish outpatient hemodialysis placement  Bleeding from PermCath site: Bleeding now stopped. Likely due to thrombocytopenia and platelet dysfunction due to uremia. We'll transfuse 1 unit of platelets if there is any further bleeding.  Hypertension. Slightly elevated. Continue patient to home hypertension medications.  Diabetes. Continue sliding scale, but hold patient to by mouth diabetes medications.  Anemia of chronic disease. Hemoglobin fairly stable even after bleeding. May need to have routine during hemodialysis.  Thrombocytopenia. Stable   All the records are reviewed and case discussed with Care Management/Social Workerr. Management plans discussed with the patient, family and they are in agreement.  CODE STATUS: Full  TOTAL TIME TAKING CARE OF THIS PATIENT: 35 minutes.     Myrtis Ser M.D on 04/18/2015 at 12:05 PM  Between 7am to 6pm - Pager - [phone]  After 6pm go to [email or web address] - [place]  [facility]  Office  [phone]  CC: Primary care physician; Sharyne Peach, MD

## 2015-04-18 NOTE — Clinical Social Work Note (Signed)
Patient is off the floor receiving his first HD treatment today. CSW will follow up with patient to complete full assessment. Patient is from [facility] in [place]. CSW contacted the [facility] owner: Dorna Bloom: [phone] and Ms. Graves is looking forward to having patient return to her facility when time. She is aware of patient's need for outpatient dialysis going forward and is able to work with patient regarding this. She did have a request and asked if his new dialysis could be arranged in the [place] dialysis center since she is located in [place]. CSW has relayed this request to Iran Sizer the [facility] Dialysis Coordinator.  Shela Leff MSW,LCSWA [phone]

## 2015-04-18 NOTE — Consult Note (Signed)
[facility] Vascular Consult Note  MRN : [id number]  Lawrence [name] is a 59 y.o. (03-May-1956) male who presents with chief complaint of No chief complaint on file. [name]  History of Present Illness: The patient is a 59 year old gentleman with a long-standing history several years which includes hypertension diabetes and chronic renal insufficiency. He currently resides in a group home secondary to his schizophrenia. He has been followed by Dr. Holley Raring. He was recently seen by Dr. Holley Raring and noted to have increasing fatigue as well as loss of appetite and generalized malaise consistent with increasing uremia. The symptoms have been ongoing over the past several weeks. The symptoms are continuous day-to-day. The location of his uremic symptoms is generalized.  The cause of this change further evaluation demonstrated his renal insufficiency it progressed and he will now be initiating hemodialysis. I am asked to evaluate for access.  Current Facility-Administered Medications  Medication Dose Route Frequency Provider Last Rate Last Dose  . 0.9 %  sodium chloride infusion   Intravenous Continuous Katha Cabal, MD 20 mL/hr at 04/17/15 1553    . 0.9 %  sodium chloride infusion  100 mL Intravenous PRN Harmeet Singh, MD      . 0.9 %  sodium chloride infusion  100 mL Intravenous PRN Murlean Iba, MD      . acetaminophen (TYLENOL) tablet 650 mg  650 mg Oral Q6H PRN Demetrios Loll, MD       Or  . acetaminophen (TYLENOL) suppository 650 mg  650 mg Rectal Q6H PRN Demetrios Loll, MD      . albuterol (PROVENTIL) (2.5 MG/3ML) 0.083% nebulizer solution 2.5 mg  2.5 mg Nebulization Q2H PRN Demetrios Loll, MD      . alteplase (CATHFLO ACTIVASE) injection 2 mg  2 mg Intracatheter Once PRN Murlean Iba, MD      . aspirin EC tablet 81 mg  81 mg Oral Daily Demetrios Loll, MD   81 mg at 04/18/15 0945  . benzonatate (TESSALON) capsule 100 mg  100 mg Oral TID PRN Demetrios Loll, MD      . benztropine (COGENTIN) tablet  0.5 mg  0.5 mg Oral BID Demetrios Loll, MD   0.5 mg at 04/18/15 0947  . carvedilol (COREG) tablet 25 mg  25 mg Oral BID WC Demetrios Loll, MD   Stopped at 04/18/15 0945  . cloNIDine (CATAPRES) tablet 0.1 mg  0.1 mg Oral BID Murlean Iba, MD   0.1 mg at 04/18/15 0945  . cyclobenzaprine (FLEXERIL) tablet 10 mg  10 mg Oral TID PRN Demetrios Loll, MD      . docusate sodium (COLACE) capsule 100 mg  100 mg Oral BID Demetrios Loll, MD   100 mg at 04/18/15 0945  . heparin injection 1,000 Units  1,000 Units Dialysis PRN Murlean Iba, MD      . hydrALAZINE (APRESOLINE) tablet 50 mg  50 mg Oral BID Murlean Iba, MD   50 mg at 04/18/15 0700  . HYDROcodone-acetaminophen (NORCO/VICODIN) 5-325 MG per tablet 1 tablet  1 tablet Oral Q4H PRN Demetrios Loll, MD   1 tablet at 04/17/15 2129  . insulin aspart (novoLOG) injection 0-5 Units  0-5 Units Subcutaneous QHS Aldean Jewett, MD      . insulin aspart (novoLOG) injection 0-9 Units  0-9 Units Subcutaneous TID WC Aldean Jewett, MD   Stopped at 04/18/15 1700  . isosorbide mononitrate (IMDUR) 24 hr tablet 90 mg  90 mg Oral Daily Demetrios Loll, MD  90 mg at 04/18/15 0945  . lidocaine (PF) (XYLOCAINE) 1 % injection 5 mL  5 mL Intradermal PRN Harmeet Singh, MD      . lidocaine-prilocaine (EMLA) cream 1 application  1 application Topical PRN Harmeet Singh, MD      . loratadine (CLARITIN) tablet 10 mg  10 mg Oral QHS Demetrios Loll, MD   10 mg at 04/17/15 2130  . niacin (NIASPAN) CR tablet 1,000 mg  1,000 mg Oral QHS Demetrios Loll, MD   1,000 mg at 04/17/15 2131  . ondansetron (ZOFRAN) tablet 4 mg  4 mg Oral Q6H PRN Demetrios Loll, MD       Or  . ondansetron [facility]) injection 4 mg  4 mg Intravenous Q6H PRN Demetrios Loll, MD   4 mg at 04/17/15 2258    Past Medical History  Diagnosis Date  . Schizophrenia   . Cerebral hemorrhage 2012  . Stroke   . Diabetes mellitus without complication   . Hypertension   . Chronic kidney disease   . Anemia   . Hyperlipidemia     Past Surgical History  Procedure  Laterality Date  . Vascular surgery      Fistula placement  . Peripheral vascular catheterization N/A 04/17/2015    Procedure: Dialysis/Perma Catheter Insertion;  Surgeon: Katha Cabal, MD;  Location: [place] [place] [facility];  Service: Cardiovascular;  Laterality: N/A;    Social History History  Substance Use Topics  . Smoking status: Never Smoker   . Smokeless tobacco: Never Used  . Alcohol Use: No    Family History Family History  Problem Relation Age of Onset  . Diabetes Mother   . Kidney cancer Mother   . Diabetes Sister   . Stroke Brother    no family history of autoimmune disease or porphyria  No Known Allergies   REVIEW OF SYSTEMS (Negative unless checked)  Constitutional: [] Weight loss  [] Fever  [] Chills Cardiac: [] Chest pain   [] Chest pressure   [] Palpitations   [] Shortness of breath when laying flat   [] Shortness of breath at rest   [x] Shortness of breath with exertion. Vascular:  [] Pain in legs with walking   [] Pain in legs at rest   [] Pain in legs when laying flat   [] Claudication   [] Pain in feet when walking  [] Pain in feet at rest  [] Pain in feet when laying flat   [] History of DVT   [] Phlebitis   [] Swelling in legs   [] Varicose veins   [] Non-healing ulcers Pulmonary:   [] Uses home oxygen   [] Productive cough   [] Hemoptysis   [] Wheeze  [] COPD   [] Asthma Neurologic:  [] Dizziness  [] Blackouts   [] Seizures   [] History of stroke   [] History of TIA  [] Aphasia   [] Temporary blindness   [] Dysphagia   [] Weakness or numbness in arms   [] Weakness or numbness in legs Musculoskeletal:  [] Arthritis   [] Joint swelling   [] Joint pain   [] Low back pain Hematologic:  [x] Easy bruising  [] Easy bleeding   [] Hypercoagulable state   [] Anemic  [] Hepatitis Gastrointestinal:  [] Blood in stool   [] Vomiting blood  [] Gastroesophageal reflux/heartburn   [] Difficulty swallowing. Genitourinary:  [x] Chronic kidney disease   [] Difficult urination  [] Frequent urination  [] Burning with urination    [] Blood in urine Skin:  [] Rashes   [] Ulcers   [] Wounds Psychological:  [] History of anxiety   []  History of major depression.    Physical Examination  Filed Vitals:   04/18/15 1530 04/18/15 1600 04/18/15 1630 04/18/15 1700  BP:    166/80  Pulse: 102 105 104 101  Temp:      TempSrc:      Resp: 16 16 18 18   Height:      Weight:      SpO2:       Body mass index is 26.72 kg/(m^2).  Head: [place]/AT, No temporalis wasting. Prominent temp pulse not noted. Ear/Nose/Throat: Hearing grossly intact, nares w/o erythema or drainage, oropharynx w/o Erythema/Exudate, Mallampati score: Class III.  Dentition poor.  Eyes: PERRLA, EOMI.  Neck: Supple, no nuchal rigidity.  No bruit or JVD.  Pulmonary:  Good air movement, clear to auscultation bilaterally.  Cardiac: RRR, normal S1, S2, no Murmurs, rubs or gallops. Vascular:  Vessel Right Left  Radial Palpable Palpable  Ulnar Palpable Palpable  Brachial Palpable Palpable  Carotid Palpable, without bruit Palpable, without bruit  Aorta Not palpable N/A  Femoral Palpable Palpable  Popliteal Palpable Palpable  PT Palpable Palpable  DP Palpable Palpable   Gastrointestinal: soft, non-tender/non-distended. No guarding/reflex. No masses, surgical incisions, or scars. Musculoskeletal: M/S 5/5 throughout.  Extremities without ischemic changes.  No deformity or atrophy. No edema. Neurologic: CN 2-12 intact. Pain and light touch intact in extremities.  Symmetrical.  Speech is fluent. Motor exam as listed above. Psychiatric: Judgment poor, Mood & affect appropriate for pt's clinical situation but somewhat blunted consistent with his schizophrenia. Dermatologic: No rashes or ulcers noted.  No cellulitis or open wounds. Lymph : No Cervical, Axillary, or Inguinal lymphadenopathy.   CBC Lab Results  Component Value Date   WBC 4.0 04/17/2015   HGB 7.2* 04/17/2015   HCT 21.4* 04/17/2015   MCV 91.1 04/17/2015   PLT 101* 04/17/2015    BMET    Component  Value Date/Time   NA 140 04/17/2015 0527   NA 140 03/21/2015 1512   K 4.7 04/17/2015 0527   K 4.0 03/21/2015 1512   CL 106 04/17/2015 0527   CL 95* 03/21/2015 1512   CO2 23 04/17/2015 0527   CO2 36* 03/21/2015 1512   GLUCOSE 92 04/17/2015 0527   GLUCOSE 117* 03/21/2015 1512   BUN 86* 04/17/2015 0527   BUN 64* 03/21/2015 1512   CREATININE 9.50* 04/17/2015 0527   CREATININE 7.71* 03/21/2015 1512   CALCIUM 8.7* 04/17/2015 0527   CALCIUM 8.5* 03/21/2015 1512   GFRNONAA 5* 04/17/2015 0527   GFRNONAA 7* 03/21/2015 1512   GFRAA 6* 04/17/2015 0527   GFRAA 8* 03/21/2015 1512   Estimated Creatinine Clearance: 8.5 mL/min (by C-G formula based on Cr of 9.5).  COAG Lab Results  Component Value Date   INR 1.1 12/13/2014     Assessment/Plan ESRD. Nephrology is on consult and the intention is to start dialysis as soon as possible. Therefore upper extremity access is not a realistic option and I will plan for permacath. Patient has never had a permacath in the past his right neck is clean dry and intact and therefore I will plan for a right IJ approach. The risks and benefits are reviewed with the patient he agrees to proceed Hypertension. Continue patient to home hypertension medications. Diabetes. I will start a sliding scale, but hold patient to by mouth diabetes medications. Anemia of chronic disease. Follow-up labs hemoglobin. This will be monitored he will be initiated on Neupogen per the nephrology service transfusions per medical service Thrombocytopenia. Unclear ideology, follow-up platelet count. However his platelets are greater than 100 and therefore I do not feel this represents an inordinate risk considering his need for  dialysis.   Lawrence [name], Dolores Lory, MD  04/18/2015 5:27 PM

## 2015-04-18 NOTE — Progress Notes (Signed)
[name] went down to have a Right chest double lumen perm cath. placed on 5/10. At shift change I was informed that there was some bleeding from the site, and that a compression dressing was placed. About an 30 mins later Dr, Delana Meyer  informed me that he had changed the dressing and that the Pt. Needed to be cleaned up. Soon after  Dr. Candiss Norse called and informed me that he had ordered Desmpressing for the Pt as he was told by Dr. Delana Meyer that the Pt had a hematoma. On assessment I noticed that the Pt. Has a large swollen area in the neck and chest that had developed. When I went in to hang the med I noticed  that there was still a considerable amount of  blood coming from the site. Two other nurses and myself changed the Pt. and tried to put pressure on the site, however the bleeding continued. Dr.Singh, Gerald Stabs the vascular on call person  and Dr. Lavetta Nielsen were called. I discussed with Dr. Lavetta Nielsen that I was going to call [facility] as they could access the sand bad and surgicel that can be used to help stop the bleeding. Dr. Lavetta Nielsen agreed and called the supervisor and asked her to bring those item with her. Along with the supervisor we removed  the dressing, applied the surgicel and placed weight on the site. The Pt's Hem was 7.3 on 5/10 and on check  at 22:12 after this episode it was 7.2. Pt was given Morphine for pain which made him nauseaous and vomit. The swelling in the neck and chest has gone down considerably.

## 2015-04-18 NOTE — Progress Notes (Signed)
Subjective:  1st HD today Patient seen during dialysis Tolerating well  permcath placed 5/10 Had some bleeding from exit site. DDAVP given  Objective:  Vital signs in last 24 hours:  Temp:  [97.4 F (36.3 C)-98 F (36.7 C)] 97.8 F (36.6 C) (05/11 1453) Pulse Rate:  [85-105] 104 (05/11 1630) Resp:  [16-18] 18 (05/11 1630) BP: (146-173)/(44-92) 169/44 mmHg (05/11 1443) SpO2:  [98 %-100 %] 98 % (05/11 0823) Weight:  [82.1 kg (181 lb)] 82.1 kg (181 lb) (05/11 1453)  Weight change:  Filed Weights   04/16/15 1805 04/18/15 1443 04/18/15 1453  Weight: 82.146 kg (181 lb 1.6 oz) 82.1 kg (181 lb) 82.1 kg (181 lb)    Intake/Output: I/O last 3 completed shifts: In: 0  Out: 450 [Urine:450]   Intake/Output this shift:  Total I/O In: 600 [P.O.:600] Out: 400 [Urine:400]  Physical Exam: General: NAD,   Head: Normocephalic, atraumatic. Moist oral mucosal membranes  Eyes: Anicteric,  Neck: Supple, trachea midline  Lungs:  Clear to auscultation  Heart: Regular rate and rhythm  Abdomen:  Soft, nontender,   Extremities:  no peripheral edema.  Neurologic: Nonfocal, moving all four extremities  Skin: No lesions  Access: Rt IJ  PC, developing AVF    Basic Metabolic Panel:  Recent Labs Lab 04/16/15 1755 04/17/15 0527  NA 138 140  K 4.1 4.7  CL 105 106  CO2 24 23  GLUCOSE 154* 92  BUN 87* 86*  CREATININE 9.66* 9.50*  CALCIUM 8.7* 8.7*    Liver Function Tests: No results for input(s): AST, ALT, ALKPHOS, BILITOT, PROT, ALBUMIN in the last 168 hours. No results for input(s): LIPASE, AMYLASE in the last 168 hours. No results for input(s): AMMONIA in the last 168 hours.  CBC:  Recent Labs Lab 04/16/15 1755 04/17/15 0527 04/17/15 2212  WBC 4.1 4.3 4.0  HGB 7.8* 7.3* 7.2*  HCT 23.2* 22.2* 21.4*  MCV 90.6 90.7 91.1  PLT 110* 101* 101*    Cardiac Enzymes: No results for input(s): CKTOTAL, CKMB, CKMBINDEX, TROPONINI in the last 168 hours.  BNP: Invalid input(s):  POCBNP  CBG:  Recent Labs Lab 04/17/15 1138 04/17/15 1543 04/17/15 2350 04/18/15 0743 04/18/15 1131  GLUCAP 98 166* 160* 126* 208*    Microbiology: Results for orders placed or performed during the hospital encounter of 04/16/15  MRSA PCR Screening     Status: None   Collection Time: 04/16/15  7:09 PM  Result Value Ref Range Status   MRSA by PCR NEGATIVE NEGATIVE Final    Comment:        The GeneXpert MRSA Assay (FDA approved for NASAL specimens only), is one component of a comprehensive MRSA colonization surveillance program. It is not intended to diagnose MRSA infection nor to guide or monitor treatment for MRSA infections.     Coagulation Studies: No results for input(s): LABPROT, INR in the last 72 hours.  Urinalysis: No results for input(s): COLORURINE, LABSPEC, PHURINE, GLUCOSEU, HGBUR, BILIRUBINUR, KETONESUR, PROTEINUR, UROBILINOGEN, NITRITE, LEUKOCYTESUR in the last 72 hours.  Invalid input(s): APPERANCEUR    Imaging: No results found.   Medications:   . sodium chloride 20 mL/hr at 04/17/15 1553   . aspirin EC  81 mg Oral Daily  . benztropine  0.5 mg Oral BID  . carvedilol  25 mg Oral BID WC  . cloNIDine  0.1 mg Oral BID  . docusate sodium  100 mg Oral BID  . hydrALAZINE  50 mg Oral BID  . insulin aspart  0-5 Units Subcutaneous QHS  . insulin aspart  0-9 Units Subcutaneous TID WC  . isosorbide mononitrate  90 mg Oral Daily  . loratadine  10 mg Oral QHS  . niacin  1,000 mg Oral QHS   sodium chloride, sodium chloride, acetaminophen **OR** acetaminophen, albuterol, alteplase, benzonatate, cyclobenzaprine, heparin, HYDROcodone-acetaminophen, lidocaine (PF), lidocaine-prilocaine, ondansetron **OR** ondansetron (ZOFRAN) IV  Assessment/ Plan:  59 year old African American male with past medical history of diabetes mellitus, hypertension, history of CVA, hyperlipidemia, chronic kidney disease stage IV, anemia chronic kidney disease, vitamin D  deficiency, and chronic schizoaffective disorder   1. ESRD 2. Anemia of CKD.  3. Proteinuria. 4, Hypertension. 5. 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 Secondary hyperparathyroidism.  D/c planning for [facility] as requested by family #2 HD tomorrow   LOS: 2 Lawrence [name] 5/11/20164:58 PM

## 2015-04-19 LAB — RENAL FUNCTION PANEL
Albumin: 2.6 g/dL — ABNORMAL LOW (ref 3.5–5.0)
Anion gap: 9 (ref 5–15)
BUN: 62 mg/dL — ABNORMAL HIGH (ref 6–20)
CO2: 27 mmol/L (ref 22–32)
CREATININE: 7.53 mg/dL — AB (ref 0.61–1.24)
Calcium: 8.4 mg/dL — ABNORMAL LOW (ref 8.9–10.3)
Chloride: 102 mmol/L (ref 101–111)
GFR calc Af Amer: 8 mL/min — ABNORMAL LOW (ref >60)
GFR calc non Af Amer: 7 mL/min — ABNORMAL LOW (ref >60)
Glucose, Bld: 188 mg/dL — ABNORMAL HIGH (ref 65–99)
Phosphorus: 4.1 mg/dL (ref 2.5–4.6)
Potassium: 4.1 mmol/L (ref 3.5–5.1)
Sodium: 138 mmol/L (ref 135–145)

## 2015-04-19 LAB — HEPATITIS B SURFACE ANTIBODY,QUALITATIVE: Hep B S Ab: NONREACTIVE

## 2015-04-19 LAB — GLUCOSE, CAPILLARY
GLUCOSE-CAPILLARY: 134 mg/dL — AB (ref 65–99)
GLUCOSE-CAPILLARY: 119 mg/dL — AB (ref 65–99)
Glucose-Capillary: 120 mg/dL — ABNORMAL HIGH (ref 65–99)
Glucose-Capillary: 186 mg/dL — ABNORMAL HIGH (ref 65–99)

## 2015-04-19 LAB — HEPATITIS C ANTIBODY: HCV AB: 0.1 {s_co_ratio} — AB (ref 0.0–0.9)

## 2015-04-19 LAB — HEPATITIS B CORE ANTIBODY, TOTAL: Hep B Core Total Ab: NEGATIVE

## 2015-04-19 MED ORDER — NEPRO/CARBSTEADY PO LIQD: 237.0000 mL | ORAL | Status: DC | PRN

## 2015-04-19 MED ORDER — NEPRO/CARBSTEADY PO LIQD
237.0000 mL | Freq: Every day | ORAL | Status: DC
  Administered 2015-04-21 – 2015-04-23 (×3): 237 mL via ORAL

## 2015-04-19 MED ORDER — TUBERCULIN PPD 5 UNIT/0.1ML ID SOLN
5.0000 [IU] | Freq: Once | INTRADERMAL | Status: AC
  Administered 2015-04-21: 5 [IU] via INTRADERMAL
  Filled 2015-04-19 (×3): qty 0.1

## 2015-04-19 NOTE — Progress Notes (Signed)
[place] [facility] [place] NAME: Lawrence [name]    MR#:  [id number]  DATE OF BIRTH:  Apr 21, 1956  SUBJECTIVE:  CHIEF COMPLAINT:   Finished with HD. Tolerated well. Feeling slightly better.  REVIEW OF SYSTEMS:  CONSTITUTIONAL: No fever, fatigue or weakness.  EYES: No blurred or double vision.  EARS, NOSE, AND THROAT: No tinnitus or ear pain.  RESPIRATORY: No cough, shortness of breath, wheezing or hemoptysis.  CARDIOVASCULAR: No chest pain, orthopnea, edema.  GASTROINTESTINAL: No nausea, vomiting, diarrhea or abdominal pain.  GENITOURINARY: No dysuria, hematuria.  ENDOCRINE: No polyuria, nocturia,  HEMATOLOGY: Significant bleeding overnight SKIN: No rash or lesion. MUSCULOSKELETAL: No joint pain or arthritis.   NEUROLOGIC: No tingling, numbness, weakness.  PSYCHIATRY: No anxiety or depression.   DRUG ALLERGIES:  No Known Allergies  VITALS:  Blood pressure 161/83, pulse 98, temperature 98.2 F (36.8 C), temperature source Oral, resp. rate 18, height 5\' 9"  (1.753 m), weight 82.3 kg (181 lb 7 oz), SpO2 98 %.  PHYSICAL EXAMINATION:  GENERAL:  59 y.o.-year-old patient lying in the bed with no acute distress.  EYES: Pupils equal, round, reactive to light and accommodation. No scleral icterus. Extraocular muscles intact.  HEENT: Head atraumatic, normocephalic. Oropharynx and nasopharynx clear.  NECK:  Supple, no jugular venous distention. No thyroid enlargement, no tenderness.  LUNGS: Normal breath sounds bilaterally, no wheezing, rales,rhonchi or crepitation. No use of accessory muscles of respiration.  CARDIOVASCULAR: S1, S2 normal. No murmurs, rubs, or gallops.  ABDOMEN: Soft, nontender, nondistended. Bowel sounds present. No organomegaly or mass.  EXTREMITIES: No pedal edema, cyanosis, or clubbing.  NEUROLOGIC: Cranial nerves II through XII are intact. Muscle strength 5/5 in all extremities. Sensation intact. Gait not checked.   PSYCHIATRIC: The patient is alert and oriented x 3.  SKIN: Large compression bandage over the right chest HD portacath in right upper chest   LABORATORY PANEL:   CBC  Recent Labs Lab 04/17/15 2212  WBC 4.0  HGB 7.2*  HCT 21.4*  PLT 101*   ------------------------------------------------------------------------------------------------------------------  Chemistries   Recent Labs Lab 04/19/15 1023  NA 138  K 4.1  CL 102  CO2 27  GLUCOSE 188*  BUN 62*  CREATININE 7.53*  CALCIUM 8.4*   ------------------------------------------------------------------------------------------------------------------  Cardiac Enzymes No results for input(s): TROPONINI in the last 168 hours. ------------------------------------------------------------------------------------------------------------------  RADIOLOGY:  No results found.  EKG:  No orders found for this or any previous visit.  ASSESSMENT AND PLAN:   ESRD.  Percath in place in right chest. Had first HD treatment today and tolerated well.  Bleeding from PermCath site: Resolved.  Hypertension. Slightly elevated. Continue patient to home hypertension medications. Continue HD.  Diabetes. CBG's controlled. Continue sliding scale, but hold patient to by mouth diabetes medications.  Anemia of chronic disease. Hemoglobin fairly stable even after bleeding. May need to have routine during hemodialysis.  Thrombocytopenia. Stable   All the records are reviewed and case discussed with Care Management/Social Workerr. Management plans discussed with the patient, family and they are in agreement.  CODE STATUS: Full  TOTAL TIME TAKING CARE OF THIS PATIENT: 35 minutes.     Myrtis Ser M.D on 04/19/2015 at 2:04 PM  Between 7am to 6pm - Pager - [phone]  After 6pm go to [email or web address] - password [email or web address] [facility]  Office  [phone]  CC: Primary care physician; Sharyne Peach, MD

## 2015-04-19 NOTE — Progress Notes (Signed)
Subjective:  2nd HD today Patient seen during dialysis Tolerating well  permcath placed 5/10 Next HD planned for tomorrow  Objective:  Vital signs in last 24 hours:  Temp:  [97.5 F (36.4 C)-98.5 F (36.9 C)] 98.2 F (36.8 C) (05/12 1500) Pulse Rate:  [93-113] 100 (05/12 1500) Resp:  [18-21] 18 (05/12 1500) BP: (140-195)/(78-94) 140/78 mmHg (05/12 1500) SpO2:  [98 %-100 %] 100 % (05/12 1500) Weight:  [82.3 kg (181 lb 7 oz)] 82.3 kg (181 lb 7 oz) (05/12 1256)  Weight change:  Filed Weights   04/18/15 1453 04/19/15 1015 04/19/15 1256  Weight: 82.1 kg (181 lb) 82.3 kg (181 lb 7 oz) 82.3 kg (181 lb 7 oz)    Intake/Output: I/O last 3 completed shifts: In: 1322.3 [P.O.:600; I.V.:722.3] Out: [id number] [Urine:1150; Other:500]   Intake/Output this shift:  Total I/O In: 0  Out: 350 [Urine:350]  Physical Exam: General: NAD,   Head: Normocephalic, atraumatic. Moist oral mucosal membranes  Eyes: Anicteric,  Neck: Supple, trachea midline  Lungs:  Clear to auscultation  Heart: Regular rate and rhythm  Abdomen:  Soft, nontender,   Extremities:  no peripheral edema.  Neurologic: Nonfocal, moving all four extremities  Skin: No lesions  Access: Rt IJ  PC, developing AVF    Basic Metabolic Panel:  Recent Labs Lab 04/16/15 1755 04/17/15 0527 04/19/15 1023  NA 138 140 138  K 4.1 4.7 4.1  CL 105 106 102  CO2 24 23 27   GLUCOSE 154* 92 188*  BUN 87* 86* 62*  CREATININE 9.66* 9.50* 7.53*  CALCIUM 8.7* 8.7* 8.4*  PHOS  --   --  4.1    Liver Function Tests:  Recent Labs Lab 04/19/15 1023  ALBUMIN 2.6*   No results for input(s): LIPASE, AMYLASE in the last 168 hours. No results for input(s): AMMONIA in the last 168 hours.  CBC:  Recent Labs Lab 04/16/15 1755 04/17/15 0527 04/17/15 2212  WBC 4.1 4.3 4.0  HGB 7.8* 7.3* 7.2*  HCT 23.2* 22.2* 21.4*  MCV 90.6 90.7 91.1  PLT 110* 101* 101*    Cardiac Enzymes: No results for input(s): CKTOTAL, CKMB, CKMBINDEX,  TROPONINI in the last 168 hours.  BNP: Invalid input(s): POCBNP  CBG:  Recent Labs Lab 04/18/15 1131 04/18/15 1749 04/18/15 2127 04/19/15 0736 04/19/15 1343  GLUCAP 208* 191* 180* 120* 134*    Microbiology: Results for orders placed or performed during the hospital encounter of 04/16/15  MRSA PCR Screening     Status: None   Collection Time: 04/16/15  7:09 PM  Result Value Ref Range Status   MRSA by PCR NEGATIVE NEGATIVE Final    Comment:        The GeneXpert MRSA Assay (FDA approved for NASAL specimens only), is one component of a comprehensive MRSA colonization surveillance program. It is not intended to diagnose MRSA infection nor to guide or monitor treatment for MRSA infections.     Coagulation Studies: No results for input(s): LABPROT, INR in the last 72 hours.  Urinalysis: No results for input(s): COLORURINE, LABSPEC, PHURINE, GLUCOSEU, HGBUR, BILIRUBINUR, KETONESUR, PROTEINUR, UROBILINOGEN, NITRITE, LEUKOCYTESUR in the last 72 hours.  Invalid input(s): APPERANCEUR    Imaging: No results found.   Medications:   . sodium chloride 20 mL/hr at 04/17/15 1553   . aspirin EC  81 mg Oral Daily  . benztropine  0.5 mg Oral BID  . carvedilol  25 mg Oral BID WC  . cloNIDine  0.1 mg Oral BID  .  docusate sodium  100 mg Oral BID  . feeding supplement (NEPRO CARB STEADY)  237 mL Oral Daily  . hydrALAZINE  50 mg Oral BID  . insulin aspart  0-5 Units Subcutaneous QHS  . insulin aspart  0-9 Units Subcutaneous TID WC  . isosorbide mononitrate  90 mg Oral Daily  . loratadine  10 mg Oral QHS  . niacin  1,000 mg Oral QHS  . tuberculin  5 Units Intradermal Once   sodium chloride, sodium chloride, acetaminophen **OR** acetaminophen, albuterol, benzonatate, cyclobenzaprine, heparin, HYDROcodone-acetaminophen, lidocaine (PF), lidocaine-prilocaine, ondansetron **OR** ondansetron (ZOFRAN) IV  Assessment/ Plan:  59 year old African American male with past medical  history of diabetes mellitus, hypertension, history of CVA, hyperlipidemia, chronic kidney disease stage IV, anemia chronic kidney disease, vitamin D deficiency, and chronic schizoaffective disorder   1. ESRD 2. Anemia of CKD.  3. Proteinuria. 4, Hypertension. 5. 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 Secondary hyperparathyroidism.  D/c planning for [facility] as requested by family #3HD tomorrow   LOS: 3 Alfard Cochrane 5/12/20164:11 PM

## 2015-04-19 NOTE — Progress Notes (Signed)
Pre-hd note

## 2015-04-19 NOTE — Progress Notes (Signed)
Patient A/O, no noted distress. Denies pain. No noted changes. LUE some noted edema. Tolerated meds well,. Staff will continue to monitor and meet needs.

## 2015-04-19 NOTE — Care Management Note (Signed)
I have sent referral to [facility] for out patient dialysis. They are in the process of reviewing patients medical records. Also, I have spoken with [name] about transportation to and from treatment.  Ms. Lawrence [name] will be taking the patient on his 1st visit and I will contact SW at the clinic to find out what [facility] transportation policy is and what companies services the dialysis centers and pass the information onto Ms. [name].

## 2015-04-19 NOTE — Progress Notes (Signed)
This note also relates to the following rows which could not be included: Pulse Rate - Cannot attach notes to unvalidated device data Resp - Cannot attach notes to unvalidated device data BP - Cannot attach notes to unvalidated device data SpO2 - Cannot attach notes to unvalidated device data   HD tx start

## 2015-04-19 NOTE — Clinical Social Work Note (Signed)
Clinical Social Work Assessment  Patient Details  Name: Lawrence [name] MRN: [id number] Date of Birth: [birth year]  Date of referral:  04/19/15               Reason for consult:  Facility Placement                Permission sought to share information with:  Facility Art therapist granted to share information::  Yes, Verbal Permission Granted  Name::        Agency::     Relationship::     Contact Information:     Housing/Transportation Living arrangements for the past 2 months:  Group Home Source of Information:  Patient Patient Interpreter Needed:  None Criminal Activity/Legal Involvement Pertinent to Current Situation/Hospitalization:  No - Comment as needed Significant Relationships:  None Lives with:  Facility Resident Do you feel safe going back to the place where you live?  Yes Need for family participation in patient care:  No (Coment)  Care giving concerns:  None    Facilities manager / plan:  CSW spoke with patient this afternoon after he returned from his second dialysis treatment. Patient confirmed that he lives at [facility] and he stated that he has lived there about a year and that he really enjoys it there. Patient states that he understands he needs to go to the [place] dialysis center because his [facility] is in [employer].   Employment status:  Disabled (Comment on whether or not currently receiving Disability) Insurance information:  Medicare PT Recommendations:  Not assessed at this time Information / Referral to community resources:     Patient/Family's Response to care:  positive Patient/Family's Understanding of and Emotional Response to Diagnosis, Current Treatment, and Prognosis:  Patient verbalized understanding.  Emotional Assessment Appearance:  Appears stated age Attitude/Demeanor/Rapport:   (pleasant and cooperative) Affect (typically observed):  Accepting, Appropriate Orientation:  Oriented to Self, Oriented to  Place, Oriented to  Time, Oriented to Situation Alcohol / Substance use:  Not Applicable Psych involvement (Current and /or in the community):  Outpatient Provider  Discharge Needs  Concerns to be addressed:  No discharge needs identified Readmission within the last 30 days:  No Current discharge risk:  None Barriers to Discharge:  Waiting for outpatient dialysis   Shela Leff, LCSW 04/19/2015, 3:11 PM

## 2015-04-20 LAB — CBC
HEMATOCRIT: 17.8 % — AB (ref 40.0–52.0)
Hemoglobin: 6.1 g/dL — ABNORMAL LOW (ref 13.0–18.0)
MCH: 31.4 pg (ref 26.0–34.0)
MCHC: 34.5 g/dL (ref 32.0–36.0)
MCV: 90.8 fL (ref 80.0–100.0)
PLATELETS: 77 10*3/uL — AB (ref 150–440)
RBC: 1.96 MIL/uL — ABNORMAL LOW (ref 4.40–5.90)
RDW: 14.9 % — ABNORMAL HIGH (ref 11.5–14.5)
WBC: 6.7 10*3/uL (ref 3.8–10.6)

## 2015-04-20 LAB — GLUCOSE, CAPILLARY
GLUCOSE-CAPILLARY: 112 mg/dL — AB (ref 65–99)
GLUCOSE-CAPILLARY: 141 mg/dL — AB (ref 65–99)
GLUCOSE-CAPILLARY: 134 mg/dL — AB (ref 65–99)

## 2015-04-20 LAB — BASIC METABOLIC PANEL
ANION GAP: 9 (ref 5–15)
BUN: 41 mg/dL — ABNORMAL HIGH (ref 6–20)
CALCIUM: 8.2 mg/dL — AB (ref 8.9–10.3)
CHLORIDE: 99 mmol/L — AB (ref 101–111)
CO2: 29 mmol/L (ref 22–32)
CREATININE: 5.85 mg/dL — AB (ref 0.61–1.24)
GFR calc Af Amer: 11 mL/min — ABNORMAL LOW (ref >60)
GFR calc non Af Amer: 10 mL/min — ABNORMAL LOW (ref >60)
GLUCOSE: 103 mg/dL — AB (ref 65–99)
POTASSIUM: 3.6 mmol/L (ref 3.5–5.1)
Sodium: 137 mmol/L (ref 135–145)

## 2015-04-20 LAB — ABO/RH: ABO/RH(D): A POS

## 2015-04-20 LAB — PREPARE RBC (CROSSMATCH)

## 2015-04-20 MED ORDER — SODIUM CHLORIDE 0.9 % IV SOLN
Freq: Once | INTRAVENOUS | Status: DC
Start: 2015-04-20 — End: 2015-04-20

## 2015-04-20 MED ORDER — EPOETIN ALFA 20000 UNIT/ML IJ SOLN
20000.0000 [IU] | INTRAMUSCULAR | Status: DC
  Filled 2015-04-20: qty 1

## 2015-04-20 MED ORDER — HYDRALAZINE HCL 50 MG PO TABS
100.0000 mg | ORAL_TABLET | Freq: Three times a day (TID) | ORAL | Status: DC
  Administered 2015-04-20 – 2015-04-23 (×7): 100 mg via ORAL
  Filled 2015-04-20 (×7): qty 2

## 2015-04-20 NOTE — Clinical Social Work Note (Signed)
Patient initially was thought to be  ready for discharge today but physician saw patient's hemoglobin and patient was to be transfused today. CSW was unclear at where patient's outpatient dialysis stood and contacted Iran Sizer the Dialysis Coordinator. Ms. Dawna Part stated that she has been waiting for the medical director of the center in [place] to get back into town so that a decision can be made regarding patient's dialysis. CSW informed her that patient more than likely would be ready for discharge this weekend but CSW was informed by Ms. Riddle that patient would not be able to discharge over the weekend because she had not received an outpatient dialysis schedule as of yet. CSW informed physician.  Shela Leff MSW,LCSWA [phone]

## 2015-04-20 NOTE — Progress Notes (Signed)
Post hd note

## 2015-04-20 NOTE — Progress Notes (Signed)
Nutrition Follow-up  DOCUMENTATION CODES:     INTERVENTION: Meals and Snacks: Cater to patient preferences Medical Food Supplement Therapy: agree with Nepro daily  For added nutrition  NUTRITION DIAGNOSIS:  Inadequate oral intake related to inability to eat as evidenced by NPO status; improved as pt on diet eating >75% of recorded po intake  GOAL:  Patient will meet greater than or equal to 90% of their needs  MONITOR:  Energy Intake Electrolyte and renal Profile Anthropometrics Digestive System  REASON FOR ASSESSMENT:  Other (Comment), Malnutrition Screening Tool (RD Screen, diagnosis, Renal Diet)    ASSESSMENT:  Pt out of room on visit at HD; also had HD yesterday. Permcath placed 5/10.  PO Intake: recorded po intake 100% of all meals since admission. Per hostess this am on pick up pt ate 100% of breakfast before HD.  Medications: NS at 57mL/hr added  Labs: Electrolyte and Renal Profile:    Recent Labs Lab 04/17/15 0527 04/19/15 1023 04/20/15 0632  BUN 86* 62* 41*  CREATININE 9.50* 7.53* 5.85*  NA 140 138 137  K 4.7 4.1 3.6  PHOS  --  4.1  --    Glucose Profile:  Recent Labs  04/19/15 1645 04/19/15 2110 04/20/15 0805  GLUCAP 186* 119* 112*   Protein Profile:  Recent Labs Lab 04/19/15 1023  ALBUMIN 2.6*   Anemia Profile: Hgb 6.1,l platelets 77   Height:  Ht Readings from Last 1 Encounters:  04/16/15 5\' 9"  (1.753 m)    Weight:  Wt Readings from Last 1 Encounters:  04/20/15 181 lb 7 oz (82.3 kg)    Post HD weight yesterday 181lbs   Wt Readings from Last 10 Encounters:  04/20/15 181 lb 7 oz (82.3 kg)    BMI:  Body mass index is 26.78 kg/(m^2).  Estimated Nutritional Needs:  Kcal:  2146-2536kcals, BEE: 1625kcals, (IF 1.1-1.3)(AF 1.2)   Protein:  98-123g protein (1.2-1.5g/kg)  Fluid:  UOP+1014mL  Skin:  reviewed  Diet Order:  Diet renal/carb modified with fluid restriction Diet-HS Snack?: Nothing; Room service  appropriate?: Yes; Fluid consistency:: Thin  EDUCATION NEEDS:  Education needs addressed   Intake/Output Summary (Last 24 hours) at 04/20/15 1113 Last data filed at 04/20/15 0841  Gross per 24 hour  Intake    720 ml  Output    850 ml  Net   -130 ml    Last BM:  5/12  LOW Care Level  Dwyane Luo, RD, LDN Pager [phone]

## 2015-04-20 NOTE — Progress Notes (Signed)
Patient A/O, no noted distress. At the beginning of shift, complain of right side hurting. After patient return to bed, he no longer felt any discomfort. Patient continues to verbal needs. No change with permcath site. IV patent and infusing. Patient tolerated meds well. Staff will continue to monitor and meet needs.

## 2015-04-20 NOTE — Progress Notes (Signed)
Subjective:  3rd HD today Patient seen prior to starting dialysisl  permcath placed 5/10 hgb noted to have decreased to 6.1 Blood transfusion planned Denies any acute c/o  Objective:  Vital signs in last 24 hours:  Temp:  [98.2 F (36.8 C)-98.5 F (36.9 C)] 98.4 F (36.9 C) (05/13 1002) Pulse Rate:  [93-103] 94 (05/13 1002) Resp:  [18-21] 18 (05/13 1002) BP: (140-173)/(75-92) 162/81 mmHg (05/12 2333) SpO2:  [98 %-100 %] 100 % (05/13 1002) Weight:  [82.3 kg (181 lb 7 oz)] 82.3 kg (181 lb 7 oz) (05/12 1256)  Weight change: 0.2 kg (7.1 oz) Filed Weights   04/18/15 1453 04/19/15 1015 04/19/15 1256  Weight: 82.1 kg (181 lb) 82.3 kg (181 lb 7 oz) 82.3 kg (181 lb 7 oz)    Intake/Output: I/O last 3 completed shifts: In: 1442.3 [P.O.:240; I.V.:1202.3] Out: 1675 [Urine:1675]   Intake/Output this shift:  Total I/O In: 0  Out: 275 [Urine:275]  Physical Exam: General: NAD,   Head: Normocephalic, atraumatic. Moist oral mucosal membranes  Eyes: Anicteric,  Neck: Supple, trachea midline  Lungs:  Clear to auscultation  Heart: Regular rate and rhythm  Abdomen:  Soft, nontender,   Extremities:  no peripheral edema.  Neurologic: Nonfocal, moving all four extremities  Skin: No lesions  Access: Rt IJ  PC, developing AVF    Basic Metabolic Panel:  Recent Labs Lab 04/16/15 1755 04/17/15 0527 04/19/15 1023 04/20/15 0632  NA 138 140 138 137  K 4.1 4.7 4.1 3.6  CL 105 106 102 99*  CO2 24 23 27 29   GLUCOSE 154* 92 188* 103*  BUN 87* 86* 62* 41*  CREATININE 9.66* 9.50* 7.53* 5.85*  CALCIUM 8.7* 8.7* 8.4* 8.2*  PHOS  --   --  4.1  --     Liver Function Tests:  Recent Labs Lab 04/19/15 1023  ALBUMIN 2.6*   No results for input(s): LIPASE, AMYLASE in the last 168 hours. No results for input(s): AMMONIA in the last 168 hours.  CBC:  Recent Labs Lab 04/16/15 1755 04/17/15 0527 04/17/15 2212 04/20/15 0632  WBC 4.1 4.3 4.0 6.7  HGB 7.8* 7.3* 7.2* 6.1*  HCT 23.2*  22.2* 21.4* 17.8*  MCV 90.6 90.7 91.1 90.8  PLT 110* 101* 101* 77*    Cardiac Enzymes: No results for input(s): CKTOTAL, CKMB, CKMBINDEX, TROPONINI in the last 168 hours.  BNP: Invalid input(s): POCBNP  CBG:  Recent Labs Lab 04/19/15 0736 04/19/15 1343 04/19/15 1645 04/19/15 2110 04/20/15 0805  GLUCAP 120* 134* 186* 119* 112*    Microbiology: Results for orders placed or performed during the hospital encounter of 04/16/15  MRSA PCR Screening     Status: None   Collection Time: 04/16/15  7:09 PM  Result Value Ref Range Status   MRSA by PCR NEGATIVE NEGATIVE Final    Comment:        The GeneXpert MRSA Assay (FDA approved for NASAL specimens only), is one component of a comprehensive MRSA colonization surveillance program. It is not intended to diagnose MRSA infection nor to guide or monitor treatment for MRSA infections.     Coagulation Studies: No results for input(s): LABPROT, INR in the last 72 hours.  Urinalysis: No results for input(s): COLORURINE, LABSPEC, PHURINE, GLUCOSEU, HGBUR, BILIRUBINUR, KETONESUR, PROTEINUR, UROBILINOGEN, NITRITE, LEUKOCYTESUR in the last 72 hours.  Invalid input(s): APPERANCEUR    Imaging: No results found.   Medications:   . sodium chloride 20 mL/hr at 04/17/15 1553   . sodium chloride  Intravenous Once  . aspirin EC  81 mg Oral Daily  . benztropine  0.5 mg Oral BID  . carvedilol  25 mg Oral BID WC  . cloNIDine  0.1 mg Oral BID  . docusate sodium  100 mg Oral BID  . epoetin (EPOGEN/PROCRIT) injection  20,000 Units Subcutaneous Weekly  . feeding supplement (NEPRO CARB STEADY)  237 mL Oral Daily  . hydrALAZINE  50 mg Oral BID  . insulin aspart  0-5 Units Subcutaneous QHS  . insulin aspart  0-9 Units Subcutaneous TID WC  . isosorbide mononitrate  90 mg Oral Daily  . loratadine  10 mg Oral QHS  . niacin  1,000 mg Oral QHS  . tuberculin  5 Units Intradermal Once   sodium chloride, sodium chloride, acetaminophen  **OR** acetaminophen, albuterol, benzonatate, cyclobenzaprine, heparin, HYDROcodone-acetaminophen, lidocaine (PF), lidocaine-prilocaine, ondansetron **OR** ondansetron (ZOFRAN) IV  Assessment/ Plan:  59 year old African American male with past medical history of diabetes mellitus, hypertension, history of CVA, hyperlipidemia, chronic kidney disease stage IV, anemia chronic kidney disease, vitamin D deficiency, and chronic schizoaffective disorder   1. ESRD 2. Anemia of CKD.  3. Proteinuria. 4, Hypertension. 5. 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 Secondary hyperparathyroidism.  D/c planning for [facility] as requested by family #3HD today blood transfusion with HD Procrit started   LOS: 4 Magdelene Ruark 5/13/201610:18 AM

## 2015-04-20 NOTE — Care Management Note (Signed)
I have spoken with SW Debbie at [facility] this morning in regards to patients acceptance.  Clinic had not received records sent to [facility] Intake on 04/18/15 as per requested by Director of his group home.  It appears that they [facility] Intake) sent records to the wrong clinic because of the distance from the patients home to the chosen clinic.  I have left a message for [facility] Intake Coordinator to call me back to confirm that they are sending the records as requested to the clinic I requested in my [employer] refferal. Also, of note during my conversation with SW at the clinic she told me that the [facility] [place] is off today and they will not give me a chair time until she returns and MD reviews patients records.   I spoke with the Director of his group home this afternoon and she is going to call [facility] about Medicare/Medicaid transportation to and from dialysis.  She informed me that her request for out patient dialysis is to keep him in the county he resides in currently so that he can get transportation assistance.  Patients Medicaid actually is from [facility] where his family lives and not [place] were he has lived for the past 2 years. We are also PENDING RESULTS OF PPD.  There is no RN on site at the group home where patient resides.

## 2015-04-20 NOTE — Progress Notes (Signed)
HD tx completed.

## 2015-04-20 NOTE — Progress Notes (Signed)
[facility] NAME: Lawrence [name]    MR#:  [id number]  DATE OF BIRTH:  [birth year]  SUBJECTIVE:  CHIEF COMPLAINT:   No complaints. Would like to get up and walk around. Has been tolerating hemodialysis very well. No further bleeding  REVIEW OF SYSTEMS:  CONSTITUTIONAL: No fever, fatigue or weakness.  EYES: No blurred or double vision.  EARS, NOSE, AND THROAT: No tinnitus or ear pain.  RESPIRATORY: No cough, shortness of breath, wheezing or hemoptysis.  CARDIOVASCULAR: No chest pain, orthopnea, edema.  GASTROINTESTINAL: No nausea, vomiting, diarrhea or abdominal pain.  GENITOURINARY: No dysuria, hematuria.  ENDOCRINE: No polyuria, nocturia,  HEMATOLOGY: Significant bleeding overnight SKIN: No rash or lesion. MUSCULOSKELETAL: No joint pain or arthritis.   NEUROLOGIC: No tingling, numbness, weakness.  PSYCHIATRY: No anxiety or depression.   DRUG ALLERGIES:  No Known Allergies  VITALS:  Blood pressure 170/95, pulse 96, temperature 98.4 F (36.9 C), temperature source Oral, resp. rate 17, height 5\' 9"  (1.753 m), weight 82.3 kg (181 lb 7 oz), SpO2 100 %.  PHYSICAL EXAMINATION:  GENERAL:  59 y.o.-year-old patient lying in the bed with no acute distress.  EYES: Pupils equal, round, reactive to light and accommodation. No scleral icterus. Extraocular muscles intact.  HEENT: Head atraumatic, normocephalic. Oropharynx and nasopharynx clear.  NECK:  Supple, no jugular venous distention. No thyroid enlargement, no tenderness.  LUNGS: Normal breath sounds bilaterally, no wheezing, rales,rhonchi or crepitation. No use of accessory muscles of respiration.  CARDIOVASCULAR: S1, S2 normal. No murmurs, rubs, or gallops.  ABDOMEN: Soft, nontender, nondistended. Bowel sounds present. No organomegaly or mass.  EXTREMITIES: No pedal edema, cyanosis, or clubbing.  NEUROLOGIC: Cranial nerves II through XII are intact. Muscle strength 5/5 in all  extremities. Sensation intact. Gait not checked.  PSYCHIATRIC: The patient is alert and oriented x 3.  SKIN: No hematoma tenderness or further bleeding  Access: HD portacath in right upper chest   LABORATORY PANEL:   CBC  Recent Labs Lab 04/20/15 0632  WBC 6.7  HGB 6.1*  HCT 17.8*  PLT 77*   ------------------------------------------------------------------------------------------------------------------  Chemistries   Recent Labs Lab 04/20/15 0632  NA 137  K 3.6  CL 99*  CO2 29  GLUCOSE 103*  BUN 41*  CREATININE 5.85*  CALCIUM 8.2*   ------------------------------------------------------------------------------------------------------------------  Cardiac Enzymes No results for input(s): TROPONINI in the last 168 hours. ------------------------------------------------------------------------------------------------------------------  RADIOLOGY:  No results found.  EKG:  No orders found for this or any previous visit.  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 Vision does have a fistula in the left arm which is not ready to be used. Presented with urgent need for dialysis and has had a PermCath placed in the right chest. He has now had 3 sessions of hemodialysis and is tolerating very well. He did have some bleeding from the PermCath insertion site with hematoma which is now resolved. He is ready for discharge, unfortunately there has been a complication with finding him in outpatient hemodialysis site. This is being followed by care management.  2: Hypertension. Uncontrolled today - Continue carvedilol, clonidine, isosorbide - Increase hydralazine, consider amlodipine   3 Diabetes. CBG's controlled.  - Continue sliding scale, hold oral diabetes medications.  4 Anemia of chronic disease.  - Hemoglobin dropped after  PermCath insertion  bleeding. Bleeding is now stopped. Hemoglobin is 6.1. Transfuse 1 unit with hemodialysis for goal of hemoglobin greater than 7 continue Procrit  5 Thrombocytopenia. Platelets are up today. Also with some degree of platelet dysfunction in the setting of uremia. No further bleeding, no need for platelet transfusion at this time  6: dispo: Patient is ready for discharge back to group home. Unfortunately there has been complication with hemodialysis placement. Care management is following. Also needs PPD to be read tomorrow and there is no medical personnel at his group home.   All the records are reviewed and case discussed with Care Management/Social Workerr. Management plans discussed with the patient, family and they are in agreement.  CODE STATUS: Full  TOTAL TIME TAKING CARE OF THIS PATIENT: 35 minutes.     Myrtis Ser M.D on 04/20/2015 at 3:01 PM  Between 7am to 6pm - Pager - [phone]  After 6pm go to [email or web address] - password [email or web address] [facility]  Office  [phone]  CC: Primary care physician; Sharyne Peach, MD

## 2015-04-21 DIAGNOSIS — F209 Schizophrenia, unspecified: Secondary | ICD-10-CM | POA: Diagnosis present

## 2015-04-21 DIAGNOSIS — Y92230 Patient room in hospital as the place of occurrence of the external cause: Secondary | ICD-10-CM | POA: Diagnosis not present

## 2015-04-21 DIAGNOSIS — I97618 Postprocedural hemorrhage and hematoma of a circulatory system organ or structure following other circulatory system procedure: Secondary | ICD-10-CM | POA: Diagnosis not present

## 2015-04-21 DIAGNOSIS — N186 End stage renal disease: Secondary | ICD-10-CM | POA: Diagnosis present

## 2015-04-21 DIAGNOSIS — Z992 Dependence on renal dialysis: Secondary | ICD-10-CM | POA: Diagnosis not present

## 2015-04-21 DIAGNOSIS — E785 Hyperlipidemia, unspecified: Secondary | ICD-10-CM | POA: Diagnosis present

## 2015-04-21 DIAGNOSIS — E559 Vitamin D deficiency, unspecified: Secondary | ICD-10-CM | POA: Diagnosis present

## 2015-04-21 DIAGNOSIS — D631 Anemia in chronic kidney disease: Secondary | ICD-10-CM | POA: Diagnosis present

## 2015-04-21 DIAGNOSIS — E1121 Type 2 diabetes mellitus with diabetic nephropathy: Secondary | ICD-10-CM | POA: Diagnosis present

## 2015-04-21 DIAGNOSIS — N2581 Secondary hyperparathyroidism of renal origin: Secondary | ICD-10-CM | POA: Diagnosis present

## 2015-04-21 DIAGNOSIS — Z8249 Family history of ischemic heart disease and other diseases of the circulatory system: Secondary | ICD-10-CM | POA: Diagnosis not present

## 2015-04-21 DIAGNOSIS — Y838 Other surgical procedures as the cause of abnormal reaction of the patient, or of later complication, without mention of misadventure at the time of the procedure: Secondary | ICD-10-CM | POA: Diagnosis not present

## 2015-04-21 DIAGNOSIS — Z8673 Personal history of transient ischemic attack (TIA), and cerebral infarction without residual deficits: Secondary | ICD-10-CM | POA: Diagnosis not present

## 2015-04-21 DIAGNOSIS — Z823 Family history of stroke: Secondary | ICD-10-CM | POA: Diagnosis not present

## 2015-04-21 DIAGNOSIS — J449 Chronic obstructive pulmonary disease, unspecified: Secondary | ICD-10-CM | POA: Diagnosis present

## 2015-04-21 DIAGNOSIS — E1122 Type 2 diabetes mellitus with diabetic chronic kidney disease: Secondary | ICD-10-CM | POA: Diagnosis present

## 2015-04-21 DIAGNOSIS — I120 Hypertensive chronic kidney disease with stage 5 chronic kidney disease or end stage renal disease: Secondary | ICD-10-CM | POA: Diagnosis present

## 2015-04-21 DIAGNOSIS — Z833 Family history of diabetes mellitus: Secondary | ICD-10-CM | POA: Diagnosis not present

## 2015-04-21 LAB — GLUCOSE, CAPILLARY
GLUCOSE-CAPILLARY: 105 mg/dL — AB (ref 65–99)
Glucose-Capillary: 126 mg/dL — ABNORMAL HIGH (ref 65–99)
Glucose-Capillary: 128 mg/dL — ABNORMAL HIGH (ref 65–99)
Glucose-Capillary: 145 mg/dL — ABNORMAL HIGH (ref 65–99)

## 2015-04-21 LAB — BASIC METABOLIC PANEL
ANION GAP: 6 (ref 5–15)
BUN: 28 mg/dL — ABNORMAL HIGH (ref 6–20)
CO2: 32 mmol/L (ref 22–32)
Calcium: 8.4 mg/dL — ABNORMAL LOW (ref 8.9–10.3)
Chloride: 101 mmol/L (ref 101–111)
Creatinine, Ser: 4.61 mg/dL — ABNORMAL HIGH (ref 0.61–1.24)
GFR calc Af Amer: 15 mL/min — ABNORMAL LOW (ref >60)
GFR calc non Af Amer: 13 mL/min — ABNORMAL LOW (ref >60)
Glucose, Bld: 107 mg/dL — ABNORMAL HIGH (ref 65–99)
POTASSIUM: 4 mmol/L (ref 3.5–5.1)
SODIUM: 139 mmol/L (ref 135–145)

## 2015-04-21 LAB — CBC
HCT: 20.6 % — ABNORMAL LOW (ref 40.0–52.0)
Hemoglobin: 6.9 g/dL — ABNORMAL LOW (ref 13.0–18.0)
MCH: 30.3 pg (ref 26.0–34.0)
MCHC: 33.5 g/dL (ref 32.0–36.0)
MCV: 90.3 fL (ref 80.0–100.0)
PLATELETS: 70 10*3/uL — AB (ref 150–440)
RBC: 2.28 MIL/uL — ABNORMAL LOW (ref 4.40–5.90)
RDW: 14.8 % — ABNORMAL HIGH (ref 11.5–14.5)
WBC: 6.7 10*3/uL (ref 3.8–10.6)

## 2015-04-21 LAB — PREPARE RBC (CROSSMATCH)

## 2015-04-21 MED ORDER — SODIUM CHLORIDE 0.9 % IV SOLN
Freq: Once | INTRAVENOUS | Status: AC
  Administered 2015-04-21: 15:00:00 via INTRAVENOUS

## 2015-04-21 NOTE — Progress Notes (Signed)
Subjective:  Given blood transfusion yesterday Hbg up to 6.9 Denies acute c/o   Objective:  Vital signs in last 24 hours:  Temp:  [98.2 F (36.8 C)-98.5 F (36.9 C)] 98.3 F (36.8 C) (05/14 0813) Pulse Rate:  [91-107] 91 (05/14 0813) Resp:  [17-20] 20 (05/13 1646) BP: (141-192)/(80-96) 157/83 mmHg (05/14 0813) SpO2:  [100 %] 100 % (05/14 0813) Weight:  [83.371 kg (183 lb 12.8 oz)] 83.371 kg (183 lb 12.8 oz) (05/14 0641)  Weight change: 0 kg (0 lb) Filed Weights   04/19/15 1256 04/20/15 0945 04/21/15 0641  Weight: 82.3 kg (181 lb 7 oz) 82.3 kg (181 lb 7 oz) 83.371 kg (183 lb 12.8 oz)    Intake/Output: I/O last 3 completed shifts: In: 1252 [P.O.:360; I.V.:540; Blood:352] Out: 1400 [Urine:1400]   Intake/Output this shift:  Total I/O In: 60 [P.O.:60] Out: 250 [Urine:250]  Physical Exam: General: NAD,   Head: Normocephalic, atraumatic. Moist oral mucosal membranes  Eyes: Anicteric,  Neck: Supple, trachea midline  Lungs:  Clear to auscultation  Heart: Regular rate and rhythm  Abdomen:  Soft, nontender,   Extremities:  no peripheral edema.  Neurologic: Nonfocal, moving all four extremities  Skin: No lesions  Access: Rt IJ  PC, developing AVF    Basic Metabolic Panel:  Recent Labs Lab 04/16/15 1755 04/17/15 0527 04/19/15 1023 04/20/15 0632 04/21/15 0651  NA 138 140 138 137 139  K 4.1 4.7 4.1 3.6 4.0  CL 105 106 102 99* 101  CO2 24 23 27 29  32  GLUCOSE 154* 92 188* 103* 107*  BUN 87* 86* 62* 41* 28*  CREATININE 9.66* 9.50* 7.53* 5.85* 4.61*  CALCIUM 8.7* 8.7* 8.4* 8.2* 8.4*  PHOS  --   --  4.1  --   --     Liver Function Tests:  Recent Labs Lab 04/19/15 1023  ALBUMIN 2.6*   No results for input(s): LIPASE, AMYLASE in the last 168 hours. No results for input(s): AMMONIA in the last 168 hours.  CBC:  Recent Labs Lab 04/16/15 1755 04/17/15 0527 04/17/15 2212 04/20/15 0632 04/21/15 0651  WBC 4.1 4.3 4.0 6.7 6.7  HGB 7.8* 7.3* 7.2* 6.1* 6.9*   HCT 23.2* 22.2* 21.4* 17.8* 20.6*  MCV 90.6 90.7 91.1 90.8 90.3  PLT 110* 101* 101* 77* 70*    Cardiac Enzymes: No results for input(s): CKTOTAL, CKMB, CKMBINDEX, TROPONINI in the last 168 hours.  BNP: Invalid input(s): POCBNP  CBG:  Recent Labs Lab 04/19/15 1645 04/19/15 2110 04/20/15 0805 04/20/15 1623 04/20/15 2119  GLUCAP 186* 119* 112* 141* 134*    Microbiology: Results for orders placed or performed during the hospital encounter of 04/16/15  MRSA PCR Screening     Status: None   Collection Time: 04/16/15  7:09 PM  Result Value Ref Range Status   MRSA by PCR NEGATIVE NEGATIVE Final    Comment:        The GeneXpert MRSA Assay (FDA approved for NASAL specimens only), is one component of a comprehensive MRSA colonization surveillance program. It is not intended to diagnose MRSA infection nor to guide or monitor treatment for MRSA infections.     Coagulation Studies: No results for input(s): LABPROT, INR in the last 72 hours.  Urinalysis: No results for input(s): COLORURINE, LABSPEC, PHURINE, GLUCOSEU, HGBUR, BILIRUBINUR, KETONESUR, PROTEINUR, UROBILINOGEN, NITRITE, LEUKOCYTESUR in the last 72 hours.  Invalid input(s): APPERANCEUR    Imaging: No results found.   Medications:   . sodium chloride 20 mL/hr at 04/21/15  [id number]   . sodium chloride   Intravenous Once  . aspirin EC  81 mg Oral Daily  . benztropine  0.5 mg Oral BID  . carvedilol  25 mg Oral BID WC  . cloNIDine  0.1 mg Oral BID  . docusate sodium  100 mg Oral BID  . epoetin (EPOGEN/PROCRIT) injection  20,000 Units Subcutaneous Weekly  . feeding supplement (NEPRO CARB STEADY)  237 mL Oral Daily  . hydrALAZINE  100 mg Oral TID  . insulin aspart  0-5 Units Subcutaneous QHS  . insulin aspart  0-9 Units Subcutaneous TID WC  . isosorbide mononitrate  90 mg Oral Daily  . loratadine  10 mg Oral QHS  . niacin  1,000 mg Oral QHS  . tuberculin  5 Units Intradermal Once   acetaminophen **OR**  acetaminophen, albuterol, benzonatate, cyclobenzaprine, heparin, HYDROcodone-acetaminophen, lidocaine (PF), lidocaine-prilocaine, ondansetron **OR** ondansetron (ZOFRAN) IV  Assessment/ Plan:  59 year old African American male with past medical history of diabetes mellitus, hypertension, history of CVA, hyperlipidemia, chronic kidney disease stage IV, anemia chronic kidney disease, vitamin D deficiency, and chronic schizoaffective disorder   1. ESRD 2. Anemia of CKD & blood loss.  3. Proteinuria. 4, Hypertension. 5. 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 Secondary hyperparathyroidism.  D/c planning for [facility] as requested by family Blood transfusion with HD given 5/13 Procrit started Monitor Hgb closely   LOS: 5 Lawrence [name] 5/14/201610:48 AM

## 2015-04-21 NOTE — Progress Notes (Signed)
Patient ID: Lawrence [name], male   DOB: 08/21/1956, 59 y.o.   MRN: [id number] [facility] PROGRESS NOTE  Lawrence [name] [id number] DOB: 1956/04/18 DOA: 04/16/2015 PCP: Lawrence Peach, MD  HPI/Subjective: Patient feels well offers no complaints. Tolerated dialysis without a problem. Patient is still anemic after transfusion yesterday.  Objective: Filed Vitals:   04/21/15 0813  BP: 157/83  Pulse: 91  Temp: 98.3 F (36.8 C)  Resp:     Intake/Output Summary (Last 24 hours) at 04/21/15 1048 Last data filed at 04/21/15 0853  Gross per 24 hour  Intake    832 ml  Output    800 ml  Net     32 ml   Filed Weights   04/19/15 1256 04/20/15 0945 04/21/15 0641  Weight: 82.3 kg (181 lb 7 oz) 82.3 kg (181 lb 7 oz) 83.371 kg (183 lb 12.8 oz)    ROS: Review of Systems  Constitutional: Negative for fever and chills.  Eyes: Negative for blurred vision.  Respiratory: Negative for cough and shortness of breath.   Cardiovascular: Negative for chest pain.  Gastrointestinal: Negative for nausea, vomiting, diarrhea and constipation.  Genitourinary: Negative for dysuria.  Musculoskeletal: Negative for joint pain.  Neurological: Negative for dizziness and headaches.   Exam: Physical Exam  HENT:  Nose: No mucosal edema.  Mouth/Throat: No oropharyngeal exudate or posterior oropharyngeal edema.  Eyes: Conjunctivae, EOM and lids are normal. Pupils are equal, round, and reactive to light.  Neck: No JVD present. Carotid bruit is not present. No edema present. No thyroid mass and no thyromegaly present.  Cardiovascular: S1 normal and S2 normal.  Exam reveals no gallop.   No murmur heard. Pulses:      Dorsalis pedis pulses are 2+ on the right side, and 2+ on the left side.  Respiratory: No respiratory distress. He has no wheezes. He has no rhonchi. He has no rales.  GI: Soft. Bowel sounds are normal. There is no tenderness.  Lymphadenopathy:    He has no cervical adenopathy.   Neurological: He is alert. No cranial nerve deficit.  Skin: Skin is warm. No rash noted. Nails show no clubbing.  Psychiatric: He has a normal mood and affect.   Data Reviewed: Basic Metabolic Panel:  Recent Labs Lab 04/16/15 1755 04/17/15 0527 04/19/15 1023 04/20/15 0632 04/21/15 0651  NA 138 140 138 137 139  K 4.1 4.7 4.1 3.6 4.0  CL 105 106 102 99* 101  CO2 24 23 27 29  32  GLUCOSE 154* 92 188* 103* 107*  BUN 87* 86* 62* 41* 28*  CREATININE 9.66* 9.50* 7.53* 5.85* 4.61*  CALCIUM 8.7* 8.7* 8.4* 8.2* 8.4*  PHOS  --   --  4.1  --   --    Liver Function Tests:  Recent Labs Lab 04/19/15 1023  ALBUMIN 2.6*   CBC:  Recent Labs Lab 04/16/15 1755 04/17/15 0527 04/17/15 2212 04/20/15 0632 04/21/15 0651  WBC 4.1 4.3 4.0 6.7 6.7  HGB 7.8* 7.3* 7.2* 6.1* 6.9*  HCT 23.2* 22.2* 21.4* 17.8* 20.6*  MCV 90.6 90.7 91.1 90.8 90.3  PLT 110* 101* 101* 77* 70*   CBG:  Recent Labs Lab 04/19/15 1645 04/19/15 2110 04/20/15 0805 04/20/15 1623 04/20/15 2119  GLUCAP 186* 119* 112* 141* 134*    Recent Results (from the past 240 hour(s))  MRSA PCR Screening     Status: None   Collection Time: 04/16/15  7:09 PM  Result Value Ref Range Status   MRSA by PCR  NEGATIVE NEGATIVE Final    Comment:        The GeneXpert MRSA Assay (FDA approved for NASAL specimens only), is one component of a comprehensive MRSA colonization surveillance program. It is not intended to diagnose MRSA infection nor to guide or monitor treatment for MRSA infections.      Studies: No results found.  Scheduled Meds: . sodium chloride   Intravenous Once  . aspirin EC  81 mg Oral Daily  . benztropine  0.5 mg Oral BID  . carvedilol  25 mg Oral BID WC  . cloNIDine  0.1 mg Oral BID  . docusate sodium  100 mg Oral BID  . epoetin (EPOGEN/PROCRIT) injection  20,000 Units Subcutaneous Weekly  . feeding supplement (NEPRO CARB STEADY)  237 mL Oral Daily  . hydrALAZINE  100 mg Oral TID  . insulin  aspart  0-5 Units Subcutaneous QHS  . insulin aspart  0-9 Units Subcutaneous TID WC  . isosorbide mononitrate  90 mg Oral Daily  . loratadine  10 mg Oral QHS  . niacin  1,000 mg Oral QHS  . tuberculin  5 Units Intradermal Once   Continuous Infusions: . sodium chloride 20 mL/hr at 04/21/15 0608    Assessment/Plan: Principal Problem:   ESRD on dialysis Active Problems:   ESRD (end stage renal disease)   HTN (hypertension)   1. Anemia of chronic disease. Hemoglobin 6.9. We'll transfuse another unit of packed red blood cells today. Procrit with dialysis. 2. End-stage renal disease on dialysis. FSGS on biopsy. Awaiting dialysis slot at outpatient in [place]. 3. Hypertension essential on Coreg and clonidine and hydralazine and Imdur.   Code Status:     Code Status Orders        Start     Ordered   04/16/15 [id number]  Full code   Continuous     04/16/15 1751      Disposition Plan: Home  Time spent: 25 minutes  Lawrence [name]  [facility]

## 2015-04-21 NOTE — Progress Notes (Signed)
4 Days Post-Op  Subjective: Doing OK. Denies pain. HD catheter working well  Objective: Vital signs in last 24 hours: Temp:  [98.2 F (36.8 C)-98.5 F (36.9 C)] 98.3 F (36.8 C) (05/14 0813) Pulse Rate:  [91-107] 91 (05/14 0813) Resp:  [17-20] 20 (05/13 1646) BP: (141-192)/(80-96) 157/83 mmHg (05/14 0813) SpO2:  [100 %] 100 % (05/14 0813) Weight:  [83.371 kg (183 lb 12.8 oz)] 83.371 kg (183 lb 12.8 oz) (05/14 0641) Last BM Date: 04/20/15  Intake/Output from previous day: 05/13 0701 - 05/14 0700 In: 772 [P.O.:360; I.V.:60; Blood:352] Out: 825 [Urine:825] Intake/Output this shift: Total I/O In: 60 [P.O.:60] Out: 250 [Urine:250]  Gen: Alert, NAD Right IJ tunneled catheter- site C/D/I  Lab Results:   Recent Labs  04/20/15 0632 04/21/15 0651  WBC 6.7 6.7  HGB 6.1* 6.9*  HCT 17.8* 20.6*  PLT 77* 70*   BMET  Recent Labs  04/20/15 0632 04/21/15 0651  NA 137 139  K 3.6 4.0  CL 99* 101  CO2 29 32  GLUCOSE 103* 107*  BUN 41* 28*  CREATININE 5.85* 4.61*  CALCIUM 8.2* 8.4*   PT/INR No results for input(s): LABPROT, INR in the last 72 hours. ABG No results for input(s): PHART, HCO3 in the last 72 hours.  Invalid input(s): PCO2, PO2  Studies/Results: No results found.  Anti-infectives: Anti-infectives    Start     Dose/Rate Route Frequency Ordered Stop   04/17/15 2130  ceFAZolin (ANCEF) IVPB 1 g/50 mL premix     1 g 100 mL/hr over 30 Minutes Intravenous  Once 04/17/15 2122 04/17/15 2328   04/17/15 1725  ceFAZolin (ANCEF) IVPB 1 g/50 mL premix  Status:  Discontinued     over 30 Minutes  As needed 04/17/15 1725 04/17/15 1813      Assessment/Plan: s/p Procedure(s): Dialysis/Perma Catheter Insertion (N/A)  Continue use of tunneled HD catheter.  LOS: 5 days    Evaristo Bury 04/21/2015

## 2015-04-21 NOTE — Progress Notes (Deleted)
Pt alert and oriented x2. Disoriented to situation. Sitter at bedside. VSS.  No complaints voiced.  No hallucinations noted. Answers questions appropriately. Occasional incontinence.  Sitter order changed to PRN per Dr. Jannifer Franklin d/t patient resting quietly throughout night and no agitation being noted.Not scoring on CIWA this shift.  Will cont. To monitor.

## 2015-04-22 LAB — HEMOGLOBIN: Hemoglobin: 8.3 g/dL — ABNORMAL LOW (ref 13.0–18.0)

## 2015-04-22 LAB — TYPE AND SCREEN
ABO/RH(D): A POS
ANTIBODY SCREEN: NEGATIVE
UNIT DIVISION: 0
UNIT DIVISION: 0

## 2015-04-22 LAB — GLUCOSE, CAPILLARY
GLUCOSE-CAPILLARY: 103 mg/dL — AB (ref 65–99)
GLUCOSE-CAPILLARY: 109 mg/dL — AB (ref 65–99)
Glucose-Capillary: 156 mg/dL — ABNORMAL HIGH (ref 65–99)
Glucose-Capillary: 107 mg/dL — ABNORMAL HIGH (ref 65–99)

## 2015-04-22 MED ORDER — CLONIDINE HCL 0.1 MG PO TABS
0.2000 mg | ORAL_TABLET | Freq: Two times a day (BID) | ORAL | Status: DC
  Administered 2015-04-22 – 2015-04-23 (×2): 0.2 mg via ORAL
  Filled 2015-04-22 (×2): qty 2

## 2015-04-22 NOTE — Progress Notes (Signed)
Patient ID: Lawrence [name], male   DOB: [birth year], 59 y.o.   MRN: [id number] [facility] PROGRESS NOTE  Lawrence [name] [id number] DOB: 1956/11/16 DOA: 04/16/2015 PCP: Sharyne Peach, MD  HPI/Subjective: Patient feels well and offers no complaints. Does have a slight cough. He has been walking around to the bathroom and back.  Objective: Filed Vitals:   04/22/15 0952  BP: 175/89  Pulse: 90  Temp:   Resp:     Intake/Output Summary (Last 24 hours) at 04/22/15 1028 Last data filed at 04/22/15 0900  Gross per 24 hour  Intake 1704.33 ml  Output   1725 ml  Net -20.67 ml   Filed Weights   04/20/15 0945 04/21/15 0641 04/22/15 0259  Weight: 82.3 kg (181 lb 7 oz) 83.371 kg (183 lb 12.8 oz) 82.963 kg (182 lb 14.4 oz)    ROS: Review of Systems  Constitutional: Negative for fever and chills.  Eyes: Negative for blurred vision.  Respiratory: Positive for cough. Negative for shortness of breath.   Cardiovascular: Negative for chest pain.  Gastrointestinal: Negative for nausea, vomiting, diarrhea and constipation.  Genitourinary: Negative for dysuria.  Musculoskeletal: Negative for joint pain.  Neurological: Negative for dizziness and headaches.   Exam: Physical Exam  HENT:  Nose: No mucosal edema.  Mouth/Throat: No oropharyngeal exudate or posterior oropharyngeal edema.  Eyes: Conjunctivae, EOM and lids are normal. Pupils are equal, round, and reactive to light.  Neck: No JVD present. Carotid bruit is not present. No edema present. No thyroid mass and no thyromegaly present.  Cardiovascular: S1 normal and S2 normal.  Exam reveals no gallop.   No murmur heard. Pulses:      Dorsalis pedis pulses are 2+ on the right side, and 2+ on the left side.  Respiratory: No respiratory distress. He has no wheezes. He has no rhonchi. He has no rales.  GI: Soft. Bowel sounds are normal. There is no tenderness.  Lymphadenopathy:    He has no cervical adenopathy.  Neurological:  He is alert. No cranial nerve deficit.  Skin: Skin is warm. No rash noted. Nails show no clubbing.  Psychiatric: He has a normal mood and affect.   Data Reviewed: Basic Metabolic Panel:  Recent Labs Lab 04/16/15 1755 04/17/15 0527 04/19/15 1023 04/20/15 0632 04/21/15 0651  NA 138 140 138 137 139  K 4.1 4.7 4.1 3.6 4.0  CL 105 106 102 99* 101  CO2 24 23 27 29  32  GLUCOSE 154* 92 188* 103* 107*  BUN 87* 86* 62* 41* 28*  CREATININE 9.66* 9.50* 7.53* 5.85* 4.61*  CALCIUM 8.7* 8.7* 8.4* 8.2* 8.4*  PHOS  --   --  4.1  --   --    CBC:  Recent Labs Lab 04/16/15 1755 04/17/15 0527 04/17/15 2212 04/20/15 0632 04/21/15 0651 04/22/15 0510  WBC 4.1 4.3 4.0 6.7 6.7  --   HGB 7.8* 7.3* 7.2* 6.1* 6.9* 8.3*  HCT 23.2* 22.2* 21.4* 17.8* 20.6*  --   MCV 90.6 90.7 91.1 90.8 90.3  --   PLT 110* 101* 101* 77* 70*  --    CBG:  Recent Labs Lab 04/21/15 0811 04/21/15 1155 04/21/15 1705 04/21/15 2106 04/22/15 0757  GLUCAP 105* 126* 128* 145* 103*   Scheduled Meds: . aspirin EC  81 mg Oral Daily  . benztropine  0.5 mg Oral BID  . carvedilol  25 mg Oral BID WC  . cloNIDine  0.1 mg Oral BID  . docusate sodium  100 mg  Oral BID  . epoetin (EPOGEN/PROCRIT) injection  20,000 Units Subcutaneous Weekly  . feeding supplement (NEPRO CARB STEADY)  237 mL Oral Daily  . hydrALAZINE  100 mg Oral TID  . insulin aspart  0-5 Units Subcutaneous QHS  . insulin aspart  0-9 Units Subcutaneous TID WC  . isosorbide mononitrate  90 mg Oral Daily  . loratadine  10 mg Oral QHS  . niacin  1,000 mg Oral QHS  . tuberculin  5 Units Intradermal Once   Continuous Infusions: . sodium chloride 20 mL/hr at 04/21/15 0608    Assessment/Plan: Principal Problem:   ESRD on dialysis Active Problems:   ESRD (end stage renal disease)   HTN (hypertension)   1. Anemia of chronic disease. Hemoglobin 8.3 after transfusion yesterday. Procrit with dialysis. 2. End-stage renal disease on dialysis. FSGS on biopsy.  Awaiting dialysis slot at outpatient in [place]. Hopefully we will know more information on Monday about this progress. Potential discharge once we know a dialysis slot as outpatient. 3. Hypertension essential on Coreg and clonidine and hydralazine and Imdur. Blood pressure slightly high this morning.   Code Status:     Code Status Orders        Start     Ordered   04/16/15 [id number]  Full code   Continuous     04/16/15 1751      Disposition Plan: Home  Time spent: 20 minutes  Loletha Grayer  [facility]

## 2015-04-22 NOTE — Progress Notes (Signed)
Subjective:  Patient feels well today No acute c/o Hgb improved   Objective:  Vital signs in last 24 hours:  Temp:  [97.9 F (36.6 C)-98.7 F (37.1 C)] 97.9 F (36.6 C) (05/15 0800) Pulse Rate:  [86-96] 90 (05/15 0952) Resp:  [16-20] 20 (05/15 0800) BP: (117-183)/(54-95) 175/89 mmHg (05/15 0952) SpO2:  [99 %-100 %] 99 % (05/15 0952) Weight:  [82.963 kg (182 lb 14.4 oz)] 82.963 kg (182 lb 14.4 oz) (05/15 0259)  Weight change: 0.663 kg (1 lb 7.4 oz) Filed Weights   04/20/15 0945 04/21/15 0641 04/22/15 0259  Weight: 82.3 kg (181 lb 7 oz) 83.371 kg (183 lb 12.8 oz) 82.963 kg (182 lb 14.4 oz)    Intake/Output: I/O last 3 completed shifts: In: 1824.3 [P.O.:780; I.V.:284.3; Blood:760] Out: 1425 [Urine:1425]   Intake/Output this shift:  Total I/O In: 240 [P.O.:240] Out: 550 [Urine:550]  Physical Exam: General: NAD,   Head: Normocephalic, atraumatic. Moist oral mucosal membranes  Eyes: Anicteric,  Neck: Supple, trachea midline  Lungs:  Clear to auscultation  Heart: Regular rate and rhythm  Abdomen:  Soft, nontender,   Extremities:  no peripheral edema.  Neurologic: Nonfocal, moving all four extremities  Skin: No lesions  Access: Rt IJ  PC, developing AVF    Basic Metabolic Panel:  Recent Labs Lab 04/16/15 1755 04/17/15 0527 04/19/15 1023 04/20/15 0632 04/21/15 0651  NA 138 140 138 137 139  K 4.1 4.7 4.1 3.6 4.0  CL 105 106 102 99* 101  CO2 24 23 27 29  32  GLUCOSE 154* 92 188* 103* 107*  BUN 87* 86* 62* 41* 28*  CREATININE 9.66* 9.50* 7.53* 5.85* 4.61*  CALCIUM 8.7* 8.7* 8.4* 8.2* 8.4*  PHOS  --   --  4.1  --   --     Liver Function Tests:  Recent Labs Lab 04/19/15 1023  ALBUMIN 2.6*   No results for input(s): LIPASE, AMYLASE in the last 168 hours. No results for input(s): AMMONIA in the last 168 hours.  CBC:  Recent Labs Lab 04/16/15 1755 04/17/15 0527 04/17/15 2212 04/20/15 [id number] 04/21/15 0651 04/22/15 0510  WBC 4.1 4.3 4.0 6.7 6.7  --    HGB 7.8* 7.3* 7.2* 6.1* 6.9* 8.3*  HCT 23.2* 22.2* 21.4* 17.8* 20.6*  --   MCV 90.6 90.7 91.1 90.8 90.3  --   PLT 110* 101* 101* 77* 70*  --     Cardiac Enzymes: No results for input(s): CKTOTAL, CKMB, CKMBINDEX, TROPONINI in the last 168 hours.  BNP: Invalid input(s): POCBNP  CBG:  Recent Labs Lab 04/21/15 0811 04/21/15 1155 04/21/15 1705 04/21/15 2106 04/22/15 0757  GLUCAP 105* 126* 128* 145* 103*    Microbiology: Results for orders placed or performed during the hospital encounter of 04/16/15  MRSA PCR Screening     Status: None   Collection Time: 04/16/15  7:09 PM  Result Value Ref Range Status   MRSA by PCR NEGATIVE NEGATIVE Final    Comment:        The GeneXpert MRSA Assay (FDA approved for NASAL specimens only), is one component of a comprehensive MRSA colonization surveillance program. It is not intended to diagnose MRSA infection nor to guide or monitor treatment for MRSA infections.     Coagulation Studies: No results for input(s): LABPROT, INR in the last 72 hours.  Urinalysis: No results for input(s): COLORURINE, LABSPEC, PHURINE, GLUCOSEU, HGBUR, BILIRUBINUR, KETONESUR, PROTEINUR, UROBILINOGEN, NITRITE, LEUKOCYTESUR in the last 72 hours.  Invalid input(s): APPERANCEUR  Imaging: No results found.   Medications:     . aspirin EC  81 mg Oral Daily  . benztropine  0.5 mg Oral BID  . carvedilol  25 mg Oral BID WC  . cloNIDine  0.2 mg Oral BID  . docusate sodium  100 mg Oral BID  . epoetin (EPOGEN/PROCRIT) injection  20,000 Units Subcutaneous Weekly  . feeding supplement (NEPRO CARB STEADY)  237 mL Oral Daily  . hydrALAZINE  100 mg Oral TID  . insulin aspart  0-5 Units Subcutaneous QHS  . insulin aspart  0-9 Units Subcutaneous TID WC  . isosorbide mononitrate  90 mg Oral Daily  . loratadine  10 mg Oral QHS  . niacin  1,000 mg Oral QHS  . tuberculin  5 Units Intradermal Once   acetaminophen **OR** acetaminophen, albuterol,  benzonatate, cyclobenzaprine, heparin, HYDROcodone-acetaminophen, lidocaine (PF), lidocaine-prilocaine, ondansetron **OR** ondansetron (ZOFRAN) IV  Assessment/ Plan:  59 year old African American male with past medical history of diabetes mellitus, hypertension, history of CVA, hyperlipidemia, chronic kidney disease stage IV, anemia chronic kidney disease, vitamin D deficiency, and chronic schizoaffective disorder   1. ESRD 2. Anemia of CKD & blood loss.  3. Proteinuria. 4, Hypertension. 5. 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 Secondary hyperparathyroidism.  D/c planning for [facility] as requested by family Blood transfusion with HD given 5/13 Procrit started Monitor Hgb closely   LOS: 6 Lawrence [name] 5/15/201611:51 AM

## 2015-04-23 LAB — GLUCOSE, CAPILLARY
Glucose-Capillary: 135 mg/dL — ABNORMAL HIGH (ref 65–99)
Glucose-Capillary: 114 mg/dL — ABNORMAL HIGH (ref 65–99)
Glucose-Capillary: 124 mg/dL — ABNORMAL HIGH (ref 65–99)

## 2015-04-23 MED ORDER — NEPRO/CARBSTEADY PO LIQD: 237.0000 mL | Freq: Every day | ORAL | Status: DC

## 2015-04-23 MED ORDER — LOSARTAN POTASSIUM 100 MG PO TABS: 100.0000 mg | ORAL_TABLET | Freq: Every day | ORAL | Status: DC

## 2015-04-23 MED ORDER — LOSARTAN POTASSIUM 50 MG PO TABS
100.0000 mg | ORAL_TABLET | Freq: Every day | ORAL | Status: DC
  Administered 2015-04-23: 100 mg via ORAL
  Filled 2015-04-23: qty 2

## 2015-04-23 MED ORDER — HYDRALAZINE HCL 100 MG PO TABS: 100.0000 mg | ORAL_TABLET | Freq: Three times a day (TID) | ORAL | Status: DC

## 2015-04-23 NOTE — Progress Notes (Signed)
   04/23/15 0546  PPD Results  Does patient have an induration at the injection site? No

## 2015-04-23 NOTE — Care Management Note (Signed)
Case Management Note  Patient Details  Name: Casin Scholtes MRN: [id number] Date of Birth: March 19, 1956  Subjective/Objective:   Medically stable for discharge per hospitalist. Awaiting on new dialysis to be arranged by [facility] Sizer at [facility]                 Action/Plan:   Discharge back to group home once dialysis is arranged.    Expected Discharge Date:                  Expected Discharge Plan:  Group Home  In-House Referral:     Discharge planning Services     Post Acute Care Choice:    Choice offered to:     DME Arranged:    DME Agency:     HH Arranged:    HH Agency:     Status of Service:  In process, will continue to follow  Medicare Important Message Given:  Yes Date Medicare IM Given:  04/23/15 Medicare IM give by:  Orvan July, RN BSN Date Additional Medicare IM Given:    Additional Medicare Important Message give by:     If discussed at [place] of Stay Meetings, dates discussed:    Additional Comments:  Jolly Mango, RN 04/23/2015, 10:29 AM

## 2015-04-23 NOTE — Discharge Instructions (Signed)
End-Stage Kidney Disease °The kidneys are two organs that lie on either side of the spine between the middle of the back and the front of the abdomen. The kidneys:  °· Remove wastes and extra water from the blood.   °· Produce important hormones. These help keep bones strong, regulate blood pressure, and help create red blood cells.   °· Balance the fluids and chemicals in the blood and tissues. °End-stage kidney disease occurs when the kidneys are so damaged that they cannot do their job. When the kidneys cannot do their job, life-threatening problems occur. The body cannot stay clean and strong without the help of the kidneys. In end-stage kidney disease, the kidneys cannot get better. You need a new kidney or treatments to do some of the work healthy kidneys do in order to stay alive. °CAUSES  °End-stage kidney disease usually occurs when a long-lasting (chronic) kidney disease gets worse. It may also occur after the kidneys are suddenly damaged (acute kidney injury).  °SYMPTOMS  °· Swelling (edema) of the legs, ankles, or feet.   °· Tiredness (lethargy).   °· Nausea or vomiting.   °· Confusion.   °· Problems with urination, such as:   °¨ Decreased urine production.   °¨ Frequent urination, especially at night.   °¨ Frequent accidents in children who are potty trained.   °· Muscle twitches and cramps.   °· Persistent itchiness.   °· Loss of appetite.   °· Headaches.   °· Abnormally dark or light skin.   °· Numbness in the hands or feet.   °· Easy bruising.   °· Frequent hiccups.   °· Menstruation stops. °DIAGNOSIS  °Your health care provider will measure your blood pressure and take some tests. These may include:  °· Urine tests.   °· Blood tests.   °· Imaging tests, such as:   °¨ An ultrasound exam.   °¨ Computed tomography (CT). °· A kidney biopsy. °TREATMENT  °There are two treatments for end-stage kidney disease:  °· A procedure that removes toxic wastes from the body (dialysis).   °· Receiving a new kidney  (kidney transplant). °Both of these treatments have serious risks and consequences. Your health care provider will help you determine which treatment is best for you based on your health, age, and other factors. In addition to having dialysis or a kidney transplant, you may need to take medicines to control high blood pressure (hypertension) and cholesterol and to decrease phosphorus levels in your blood.  °HOME CARE INSTRUCTIONS °· Follow your prescribed diet.   °· Take medicines only as directed by your health care provider.   °· Do not take any new medicines (prescription, over-the-counter, or nutritional supplements) unless approved by your health care provider. Many medicines can worsen your kidney damage or need to have the dose adjusted.   °· Keep all follow-up visits as directed by your health care provider. °MAKE SURE YOU: °· Understand these instructions. °· Will watch your condition. °· Will get help right away if you are not doing well or get worse. °Document Released: 02/14/2004 Document Revised: 04/10/2014 Document Reviewed: 07/23/2012 °ExitCare® Patient Information ©2015 [employer]. This information is not intended to replace advice given to you by your health care provider. Make sure you discuss any questions you have with your health care provider. ° °

## 2015-04-23 NOTE — Discharge Summary (Signed)
[name] [place] NAME: Lawrence [name]    MR#:  [id number]  DATE OF BIRTH:  12-Aug-1956  DATE OF ADMISSION:  04/16/2015 ADMITTING PHYSICIAN: Demetrios Loll, MD  DATE OF DISCHARGE:  PRIMARY CARE PHYSICIAN: Sharyne Peach, MD    ADMISSION DIAGNOSIS:  End Stage Renal Disease New Dialysis   DISCHARGE DIAGNOSIS:  Principal Problem:   ESRD on dialysis Active Problems:   ESRD (end stage renal disease)   HTN (hypertension)   SECONDARY DIAGNOSIS:   Past Medical History  Diagnosis Date  . Schizophrenia   . Cerebral hemorrhage 2012  . Stroke   . Diabetes mellitus without complication   . Hypertension   . Chronic kidney disease   . Anemia   . Hyperlipidemia     HOSPITAL COURSE:   Patient was admitted to start dialysis. 3 dialysis were done during the hospitalization. The hold up on discharge was a outpatient slot in [place]. Now the patient is accepted on Tuesday Thursday and Saturday. Patient will be discharged back to his facility today. Anemia of chronic disease: the patient was given 2 units of packed red blood cells while here in the hospital. Procrit with dialysis. Accelerated hypertension: Patient's blood pressure is been variable during the hospital course. Some adjustments in medications were done.  DISCHARGE CONDITIONS:   Satisfactory  CONSULTS OBTAINED:  Treatment Team:  Katha Cabal, MD Murlean Iba, MD  DRUG ALLERGIES:  No Known Allergies  DISCHARGE MEDICATIONS:   Current Discharge Medication List    START taking these medications   Details  losartan (COZAAR) 100 MG tablet Take 1 tablet (100 mg total) by mouth daily. Qty: 30 tablet, Refills: 0    Nutritional Supplements (FEEDING SUPPLEMENT, NEPRO CARB STEADY,) LIQD Take 237 mLs by mouth daily. Qty: 30 Can, Refills: 5      CONTINUE these medications which have CHANGED   Details  hydrALAZINE (APRESOLINE) 100 MG tablet Take 1 tablet (100 mg  total) by mouth 3 (three) times daily. Qty: 90 tablet, Refills: 0      CONTINUE these medications which have NOT CHANGED   Details  aspirin EC 81 MG tablet Take 81 mg by mouth daily.    benzonatate (TESSALON) 100 MG capsule Take 100 mg by mouth 3 (three) times daily as needed for cough.    benztropine (COGENTIN) 0.5 MG tablet Take 0.5 mg by mouth 2 (two) times daily.    carvedilol (COREG) 25 MG tablet Take 25 mg by mouth 2 (two) times daily with a meal.    cetirizine (ZYRTEC) 10 MG tablet Take 10 mg by mouth daily. At Bedtime    cloNIDine (CATAPRES) 0.3 MG tablet Take 0.3 mg by mouth 2 (two) times daily.    cyclobenzaprine (FLEXERIL) 10 MG tablet Take 10 mg by mouth 3 (three) times daily as needed for muscle spasms. PRN once daily    docusate sodium (COLACE) 100 MG capsule Take 100 mg by mouth 2 (two) times daily.    glipiZIDE (GLUCOTROL) 5 MG tablet Take 5 mg by mouth daily before breakfast.    haloperidol (HALDOL) 5 MG tablet Take 7.5 mg by mouth. At bedtime    HYDROcodone-acetaminophen (NORCO/VICODIN) 5-325 MG per tablet Take 1 tablet by mouth every 4 (four) hours as needed for moderate pain.    isosorbide mononitrate (IMDUR) 60 MG 24 hr tablet Take 90 mg by mouth daily.    Multiple Vitamins-Minerals (MULTIVITAMIN WITH MINERALS) tablet Take 1 tablet by mouth  daily.    niacin (NIASPAN) 1000 MG CR tablet Take 1,000 mg by mouth at bedtime.    Vitamin D, Ergocalciferol, (DRISDOL) 50000 UNITS CAPS capsule Take 50,000 Units by mouth every 7 (seven) days.      STOP taking these medications     amLODipine-benazepril (LOTREL) 10-20 MG per capsule      antiseptic oral rinse (BIOTENE) LIQD          DISCHARGE INSTRUCTIONS:   Follow-up at [facility] dialysis Tuesday Thursday and Saturday.  If you experience worsening of your admission symptoms, develop shortness of breath, life threatening emergency, suicidal or homicidal thoughts you must seek medical attention immediately  by calling 911 or calling your MD immediately  if symptoms less severe.  You Must read complete instructions/literature along with all the possible adverse reactions/side effects for all the Medicines you take and that have been prescribed to you. Take any new Medicines after you have completely understood and accept all the possible adverse reactions/side effects.   Please note  You were cared for by a hospitalist during your hospital stay. If you have any questions about your discharge medications or the care you received while you were in the hospital after you are discharged, you can call the unit and asked to speak with the hospitalist on call if the hospitalist that took care of you is not available. Once you are discharged, your primary care physician will handle any further medical issues. Please note that NO REFILLS for any discharge medications will be authorized once you are discharged, as it is imperative that you return to your primary care physician (or establish a relationship with a primary care physician if you do not have one) for your aftercare needs so that they can reassess your need for medications and monitor your lab values.    Today   CHIEF COMPLAINT:  Brought in for starting dialysis.  HISTORY OF PRESENT ILLNESS:  Lawrence [name]  is a 59 y.o. male with a known history of chronic kidney disease brought in for starting dialysis.   VITAL SIGNS:  Blood pressure 170/95, pulse 86, temperature 98.5 F (36.9 C), temperature source Oral, resp. rate 17, height 5\' 9"  (1.753 m), weight 84.505 kg (186 lb 4.8 oz), SpO2 100 %.  I/O:   Intake/Output Summary (Last 24 hours) at 04/23/15 1420 Last data filed at 04/23/15 0847  Gross per 24 hour  Intake 662.33 ml  Output    550 ml  Net 112.33 ml    PHYSICAL EXAMINATION:  GENERAL:  59 y.o.-year-old patient lying in the bed with no acute distress.  EYES: Pupils equal, round, reactive to light and accommodation. No scleral  icterus. Extraocular muscles intact.  HEENT: Head atraumatic, normocephalic. Oropharynx and nasopharynx clear.  NECK:  Supple, no jugular venous distention. No thyroid enlargement, no tenderness.  LUNGS: Normal breath sounds bilaterally, no wheezing, rales,rhonchi or crepitation. No use of accessory muscles of respiration.  CARDIOVASCULAR: S1, S2 normal. No murmurs, rubs, or gallops.  ABDOMEN: Soft, non-tender, non-distended. Bowel sounds present. No organomegaly or mass.  EXTREMITIES: No pedal edema, cyanosis, or clubbing.  NEUROLOGIC: Cranial nerves II through XII are intact. Muscle strength 5/5 in all extremities. Sensation intact. Gait not checked.  PSYCHIATRIC: The patient is alert and oriented x 3.  SKIN: No obvious rash, lesion, or ulcer.   DATA REVIEW:   CBC  Recent Labs Lab 04/21/15 0651 04/22/15 0510  WBC 6.7  --   HGB 6.9* 8.3*  HCT 20.6*  --  PLT 70*  --     Chemistries   Recent Labs Lab 04/21/15 0651  NA 139  K 4.0  CL 101  CO2 32  GLUCOSE 107*  BUN 28*  CREATININE 4.61*  CALCIUM 8.4*   Management plans discussed with the patient, and he is in agreement.  CODE STATUS:     Code Status Orders        Start     Ordered   04/16/15 1749  Full code   Continuous     04/16/15 1751      TOTAL TIME TAKING CARE OF THIS PATIENT: 40 minutes.    Loletha Grayer M.D on 04/23/2015 at 2:20 PM  Between 7am to 6pm - Pager - [phone]  After 6pm go to [email or web address] - password [email or web address] [facility]  Office  [phone]  CC: Primary care physician; Sharyne Peach, MD

## 2015-04-23 NOTE — Progress Notes (Signed)
Order received to discharge patient back to [facility] care  Facility. Discharge instructions given to Miss Berenice Primas, the owner. Informed her about the dialysis issue. Instructed her to call dialysis center tomorrow before noon to see if the patient will definitely have dialysis on schedule and to call Dr. Assunta Gambles office for any possible issue.

## 2015-04-23 NOTE — Progress Notes (Signed)
Subjective: Feeling well this AM. No acute complaints.   Objective: Vital signs in last 24 hours: Temp:  [97.8 F (36.6 C)-98.5 F (36.9 C)] 98.5 F (36.9 C) (05/16 0751) Pulse Rate:  [86-94] 86 (05/16 0751) Resp:  [17-19] 17 (05/16 0751) BP: (130-170)/(81-96) 170/95 mmHg (05/16 0752) SpO2:  [98 %-100 %] 100 % (05/16 0751) Weight:  [84.505 kg (186 lb 4.8 oz)] 84.505 kg (186 lb 4.8 oz) (05/16 0527) Weight change: 1.542 kg (3 lb 6.4 oz)  Intake/Output from previous day:  Intake/Output Summary (Last 24 hours) at 04/23/15 1059 Last data filed at 04/23/15 0847  Gross per 24 hour  Intake 902.33 ml  Output    800 ml  Net 102.33 ml    Intake/Output this shift: Total I/O In: 240 [P.O.:240] Out: 150 [Urine:150]  General appearance: NAD HEENT: Atraumatic; EOMI, hearing intact, OM moist Neck: Trachea midline; supple Lungs: CTAB, with normal respiratory effort  CV: S1S2, no rub Abdomen: Soft, NTND, BS present Extremities: trace b/l LE edema Skin: Warm and dry, no rashes noted. Neuro/Psych: Awake, alert, following commands, normal mood Access: LUE AVF  Lab Results: Results for orders placed or performed during the hospital encounter of 04/16/15 (from the past 24 hour(s))  Glucose, capillary     Status: Abnormal   Collection Time: 04/22/15 12:24 PM  Result Value Ref Range   Glucose-Capillary 156 (H) 65 - 99 mg/dL  Glucose, capillary     Status: Abnormal   Collection Time: 04/22/15  4:18 PM  Result Value Ref Range   Glucose-Capillary 107 (H) 65 - 99 mg/dL  Glucose, capillary     Status: Abnormal   Collection Time: 04/22/15  9:57 PM  Result Value Ref Range   Glucose-Capillary 109 (H) 65 - 99 mg/dL   Comment 1 Notify RN   Glucose, capillary     Status: Abnormal   Collection Time: 04/23/15  9:18 AM  Result Value Ref Range   Glucose-Capillary 135 (H) 65 - 99 mg/dL   Comment 1 Notify RN     Studies/Results: No results found.  [name] aspirin EC  81 mg Oral Daily  .  benztropine  0.5 mg Oral BID  . carvedilol  25 mg Oral BID WC  . cloNIDine  0.2 mg Oral BID  . docusate sodium  100 mg Oral BID  . epoetin (EPOGEN/PROCRIT) injection  20,000 Units Subcutaneous Weekly  . feeding supplement (NEPRO CARB STEADY)  237 mL Oral Daily  . hydrALAZINE  100 mg Oral TID  . insulin aspart  0-5 Units Subcutaneous QHS  . insulin aspart  0-9 Units Subcutaneous TID WC  . isosorbide mononitrate  90 mg Oral Daily  . loratadine  10 mg Oral QHS  . niacin  1,000 mg Oral QHS    Assessment/Plan: 59 year old African American male with past medical history of diabetes mellitus, hypertension, history of CVA, hyperlipidemia, chronic kidney disease stage V, anemia chronic kidney disease, vitamin D deficiency, chronic schizoaffective disorder, collapsing FSGS on reanl biopsy.   1. ESRD due to collapsing FSGS: due for HD again tomorrow, no acute indication today. 2. Anemia of CKD & blood loss: continue epogen 20000 units [place] weekly. 3. Proteinuria: due to FSGS, should decline overtime. 4, Hypertension.  BP fluctuating, currently on coreg, clonidine, hydralazine.  Add losartan now that he's on dialysis. 5. 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 6. Secondary hyperparathyroidism: last phos 4.1 and accpetable.   LOS: 7 days  Lawrence [name] 04/23/2015,10:59 AM

## 2015-04-23 NOTE — Clinical Social Work Note (Signed)
Dr. Juleen China called CSW to say that patient did not have a Hep B Antigen faxed and that they would require the Hep B Antigen prior to being able to accept patient in the dialysis center. Nursing checked and patient's lab was drawn for the Hep B Antigen but [facility] did not process it. The nurse was informed by [facility] that they still had patient's blood and would run it this evening or in the morning with a result for the Hep B Antigen.   CSW spoke with Dr. Juleen China and he stated that patient could go ahead and be discharged this evening and worse case scenario if the hep b antigen was not available tomorrow then he would bring patient to his office to draw the antigen and patient could begin outpatient dialysis on Thursday at 12. The schedule assigned for him is T,Th,Sat at 12.  Shela Leff MSW,LCSWA [phone]

## 2015-04-24 ENCOUNTER — Other Ambulatory Visit: Payer: Self-pay

## 2015-04-24 ENCOUNTER — Ambulatory Visit: Payer: Self-pay | Admitting: Internal Medicine

## 2015-04-24 ENCOUNTER — Ambulatory Visit: Payer: Self-pay

## 2015-04-26 DIAGNOSIS — R079 Chest pain, unspecified: Secondary | ICD-10-CM | POA: Insufficient documentation

## 2015-04-26 DIAGNOSIS — I10 Essential (primary) hypertension: Secondary | ICD-10-CM | POA: Insufficient documentation

## 2015-04-26 DIAGNOSIS — R0602 Shortness of breath: Secondary | ICD-10-CM | POA: Insufficient documentation

## 2015-04-26 DIAGNOSIS — F209 Schizophrenia, unspecified: Secondary | ICD-10-CM | POA: Insufficient documentation

## 2015-04-26 DIAGNOSIS — D631 Anemia in chronic kidney disease: Secondary | ICD-10-CM | POA: Insufficient documentation

## 2015-04-26 DIAGNOSIS — D509 Iron deficiency anemia, unspecified: Secondary | ICD-10-CM | POA: Insufficient documentation

## 2015-04-26 DIAGNOSIS — I618 Other nontraumatic intracerebral hemorrhage: Secondary | ICD-10-CM | POA: Insufficient documentation

## 2015-04-26 DIAGNOSIS — D689 Coagulation defect, unspecified: Secondary | ICD-10-CM | POA: Insufficient documentation

## 2015-04-26 DIAGNOSIS — R52 Pain, unspecified: Secondary | ICD-10-CM | POA: Insufficient documentation

## 2015-04-26 DIAGNOSIS — D696 Thrombocytopenia, unspecified: Secondary | ICD-10-CM | POA: Insufficient documentation

## 2015-04-26 DIAGNOSIS — E119 Type 2 diabetes mellitus without complications: Secondary | ICD-10-CM | POA: Insufficient documentation

## 2015-04-26 DIAGNOSIS — L299 Pruritus, unspecified: Secondary | ICD-10-CM | POA: Insufficient documentation

## 2015-04-26 DIAGNOSIS — N186 End stage renal disease: Secondary | ICD-10-CM | POA: Insufficient documentation

## 2015-04-26 DIAGNOSIS — E162 Hypoglycemia, unspecified: Secondary | ICD-10-CM | POA: Insufficient documentation

## 2015-04-26 DIAGNOSIS — E785 Hyperlipidemia, unspecified: Secondary | ICD-10-CM | POA: Insufficient documentation

## 2015-04-30 DIAGNOSIS — T829XXA Unspecified complication of cardiac and vascular prosthetic device, implant and graft, initial encounter: Secondary | ICD-10-CM | POA: Insufficient documentation

## 2015-05-02 DIAGNOSIS — Z992 Dependence on renal dialysis: Secondary | ICD-10-CM

## 2015-05-02 DIAGNOSIS — N186 End stage renal disease: Secondary | ICD-10-CM | POA: Insufficient documentation

## 2015-05-04 LAB — HEPATITIS B SURFACE ANTIGEN: HEP B S AG: NEGATIVE — AB

## 2015-05-17 DIAGNOSIS — E440 Moderate protein-calorie malnutrition: Secondary | ICD-10-CM | POA: Insufficient documentation

## 2015-05-18 LAB — MISC LABCORP TEST (SEND OUT): Labcorp test code: 6510

## 2015-06-22 ENCOUNTER — Other Ambulatory Visit: Payer: Self-pay | Admitting: Radiology

## 2015-06-22 ENCOUNTER — Other Ambulatory Visit: Payer: Self-pay | Admitting: Dermatology

## 2015-06-22 DIAGNOSIS — E041 Nontoxic single thyroid nodule: Secondary | ICD-10-CM

## 2015-06-25 ENCOUNTER — Ambulatory Visit: Admission: RE | Admit: 2015-06-25 | Payer: Medicare Other | Source: Ambulatory Visit

## 2015-06-26 ENCOUNTER — Other Ambulatory Visit: Payer: Self-pay | Admitting: Radiology

## 2015-06-27 ENCOUNTER — Ambulatory Visit
Admission: RE | Admit: 2015-06-27 | Discharge: 2015-06-27 | Disposition: A | Discharge status: Home / Self Care | Payer: Medicare Other | Source: Ambulatory Visit | Attending: Dermatology | Admitting: Dermatology

## 2015-06-27 DIAGNOSIS — E041 Nontoxic single thyroid nodule: Secondary | ICD-10-CM | POA: Diagnosis present

## 2015-06-27 NOTE — Discharge Instructions (Signed)
Thyroid Biopsy °The thyroid gland is a butterfly-shaped gland situated in the front of the neck. It produces hormones which affect metabolism, growth and development, and body temperature. A thyroid biopsy is a procedure in which small samples of tissue or fluid are removed from the thyroid gland or mass and examined under a microscope. This test is done to determine the cause of thyroid problems, such as infection, cancer, or other thyroid problems. °There are 2 ways to obtain samples: °1. Fine needle biopsy. Samples are removed using a thin needle inserted through the skin and into the thyroid gland or mass. °2. Open biopsy. Samples are removed after a cut (incision) is made through the skin. °LET YOUR CAREGIVER KNOW ABOUT:  °· Allergies. °· Medications taken including herbs, eye drops, over-the-counter medications, and creams. °· Use of steroids (by mouth or creams). °· Previous problems with anesthetics or numbing medicine. °· Possibility of pregnancy, if this applies. °· History of blood clots (thrombophlebitis). °· History of bleeding or blood problems. °· Previous surgery. °· Other health problems. °RISKS AND COMPLICATIONS °· Bleeding from the site. The risk of bleeding is higher if you have a bleeding disorder or are taking any blood thinning medications (anticoagulants). °· Infection. °· Injury to structures near the thyroid gland. °BEFORE THE PROCEDURE  °This is a procedure that can be done as an outpatient. Confirm the time that you need to arrive for your procedure. Confirm whether there is a need to fast or withhold any medications. A blood sample may be done to determine your blood clotting time. Medicine may be given to help you relax (sedative). °PROCEDURE °Fine needle biopsy. °You will be awake during the procedure. You may be asked to lie on your back with your head tipped backward to extend your neck. Let your caregiver know if you cannot tolerate the positioning. An area on your neck will be  cleansed. A needle is inserted through the skin of your neck. You may feel a mild discomfort during this procedure. You may be asked to avoid coughing, talking, swallowing, or making sounds during some portions of the procedure. The needle is withdrawn once tissue or fluid samples have been removed. Pressure may be applied to the neck to reduce swelling and ensure that bleeding has stopped. The samples will be sent for examination.  °Open biopsy. °You will be given general anesthesia. You will be asleep during the procedure. An incision is made in your neck. A sample of thyroid tissue or the mass is removed. The tissue sample or mass will be sent for examination. The sample or mass may be examined during the biopsy. If the sample or mass contains cancer cells, some or all of the thyroid gland may be removed. The incision is closed with stitches. °AFTER THE PROCEDURE  °Your recovery will be assessed and monitored. If there are no problems, as an outpatient, you should be able to go home shortly after the procedure. °If you had a fine needle biopsy: °· You may have soreness at the biopsy site for 1 to 2 days. °If you had an open biopsy:  °· You may have soreness at the biopsy site for 3 to 4 days. °· You may have a hoarse voice or sore throat for 1 to 2 days. °Obtaining the Test Results °It is your responsibility to obtain your test results. Do not assume everything is normal if you have not heard from your caregiver or the medical facility. It is important for you to follow up   on all of your test results. °HOME CARE INSTRUCTIONS  °· Keeping your head raised on a pillow when you are lying down may ease biopsy site discomfort. °· Supporting the back of your head and neck with both hands as you sit up from a lying position may ease biopsy site discomfort. °· Only take over-the-counter or prescription medicines for pain, discomfort, or fever as directed by your caregiver. °· Throat lozenges or gargling with warm salt  water may help to soothe a sore throat. °SEEK IMMEDIATE MEDICAL CARE IF:  °· You have severe bleeding from the biopsy site. °· You have difficulty swallowing. °· You have a fever. °· You have increased pain, swelling, redness, or warmth at the biopsy site. °· You notice pus coming from the biopsy site. °· You have swollen glands (lymph nodes) in your neck. °Document Released: 09/21/2007 Document Revised: 03/21/2013 Document Reviewed: 02/16/2014 °[employer]® Patient Information ©2015 [employer]. This information is not intended to replace advice given to you by your health care provider. Make sure you discuss any questions you have with your health care provider. ° °

## 2015-06-27 NOTE — Procedures (Signed)
Informed consent obtained. Under US guidance, fine needle aspiration of right thyroid nodule performed. No immediate complications.

## 2015-06-29 LAB — CYTOLOGY - NON PAP

## 2015-07-03 DIAGNOSIS — Z23 Encounter for immunization: Secondary | ICD-10-CM | POA: Insufficient documentation

## 2015-07-09 ENCOUNTER — Ambulatory Visit
Admission: RE | Admit: 2015-07-09 | Discharge: 2015-07-09 | Disposition: A | Discharge status: Home / Self Care | Payer: Medicare Other | Source: Ambulatory Visit | Attending: Vascular Surgery | Admitting: Vascular Surgery

## 2015-07-09 ENCOUNTER — Encounter
Admission: RE | Disposition: A | Discharge status: Home / Self Care | Payer: Self-pay | Source: Ambulatory Visit | Attending: Vascular Surgery

## 2015-07-09 SURGERY — DIALYSIS/PERMA CATHETER REMOVAL
Anesthesia: Moderate Sedation

## 2015-07-09 NOTE — Progress Notes (Signed)
Spoke to diane, Mudlogger at hemodialysis center who explains issues with sticking/cannulating pt graft left arm, this rn informed dr dew who states will reschedule pt for perm cath removal in 2weeks. Pt/caregiver informed of plan to cont using perm cath for as hemodialysis staff cont to attempt cannulation over next 2 weeks

## 2015-07-18 ENCOUNTER — Other Ambulatory Visit: Payer: Self-pay | Admitting: Otolaryngology

## 2015-07-18 DIAGNOSIS — E041 Nontoxic single thyroid nodule: Secondary | ICD-10-CM

## 2015-07-30 ENCOUNTER — Encounter
Admission: RE | Disposition: A | Discharge status: Nursing Home (Includes ICF) | Payer: Self-pay | Source: Ambulatory Visit | Attending: Vascular Surgery

## 2015-07-30 ENCOUNTER — Encounter: Payer: Self-pay | Admitting: *Deleted

## 2015-07-30 ENCOUNTER — Ambulatory Visit
Admission: RE | Admit: 2015-07-30 | Discharge: 2015-07-30 | Disposition: A | Discharge status: Nursing Home (Includes ICF) | Payer: Medicare Other | Source: Ambulatory Visit | Attending: Vascular Surgery | Admitting: Vascular Surgery

## 2015-07-30 DIAGNOSIS — I120 Hypertensive chronic kidney disease with stage 5 chronic kidney disease or end stage renal disease: Secondary | ICD-10-CM | POA: Diagnosis not present

## 2015-07-30 DIAGNOSIS — E1122 Type 2 diabetes mellitus with diabetic chronic kidney disease: Secondary | ICD-10-CM | POA: Diagnosis not present

## 2015-07-30 DIAGNOSIS — Z992 Dependence on renal dialysis: Secondary | ICD-10-CM | POA: Insufficient documentation

## 2015-07-30 DIAGNOSIS — Y832 Surgical operation with anastomosis, bypass or graft as the cause of abnormal reaction of the patient, or of later complication, without mention of misadventure at the time of the procedure: Secondary | ICD-10-CM | POA: Diagnosis not present

## 2015-07-30 DIAGNOSIS — Z79899 Other long term (current) drug therapy: Secondary | ICD-10-CM | POA: Insufficient documentation

## 2015-07-30 DIAGNOSIS — Z7982 Long term (current) use of aspirin: Secondary | ICD-10-CM | POA: Diagnosis not present

## 2015-07-30 DIAGNOSIS — E780 Pure hypercholesterolemia: Secondary | ICD-10-CM | POA: Diagnosis not present

## 2015-07-30 DIAGNOSIS — N186 End stage renal disease: Secondary | ICD-10-CM | POA: Insufficient documentation

## 2015-07-30 DIAGNOSIS — T82858A Stenosis of vascular prosthetic devices, implants and grafts, initial encounter: Secondary | ICD-10-CM | POA: Insufficient documentation

## 2015-07-30 DIAGNOSIS — Z8673 Personal history of transient ischemic attack (TIA), and cerebral infarction without residual deficits: Secondary | ICD-10-CM | POA: Insufficient documentation

## 2015-07-30 HISTORY — PX: PERIPHERAL VASCULAR CATHETERIZATION: SHX172C

## 2015-07-30 LAB — POTASSIUM (ARMC VASCULAR LAB ONLY): Potassium (ARMC vascular lab): 3.8

## 2015-07-30 SURGERY — A/V SHUNTOGRAM/FISTULAGRAM
Anesthesia: Moderate Sedation | Laterality: Left

## 2015-07-30 MED ORDER — FENTANYL CITRATE (PF) 100 MCG/2ML IJ SOLN
INTRAMUSCULAR | Status: AC
  Filled 2015-07-30: qty 2

## 2015-07-30 MED ORDER — LIDOCAINE-EPINEPHRINE (PF) 1 %-1:200000 IJ SOLN
INTRAMUSCULAR | Status: DC | PRN
  Administered 2015-07-30: 10 mL via INTRADERMAL

## 2015-07-30 MED ORDER — HEPARIN SODIUM (PORCINE) 1000 UNIT/ML IJ SOLN
INTRAMUSCULAR | Status: AC
  Filled 2015-07-30: qty 1

## 2015-07-30 MED ORDER — HEPARIN (PORCINE) IN NACL 2-0.9 UNIT/ML-% IJ SOLN
INTRAMUSCULAR | Status: AC
  Filled 2015-07-30: qty 1000

## 2015-07-30 MED ORDER — HEPARIN SODIUM (PORCINE) 1000 UNIT/ML IJ SOLN
INTRAMUSCULAR | Status: DC | PRN
  Administered 2015-07-30: 3000 [IU] via INTRAVENOUS

## 2015-07-30 MED ORDER — MIDAZOLAM HCL 5 MG/5ML IJ SOLN
INTRAMUSCULAR | Status: AC
  Filled 2015-07-30: qty 5

## 2015-07-30 MED ORDER — CEFAZOLIN SODIUM 1-5 GM-% IV SOLN
1.0000 g | Freq: Once | INTRAVENOUS | Status: AC
  Administered 2015-07-30: 1 g via INTRAVENOUS

## 2015-07-30 MED ORDER — LIDOCAINE-EPINEPHRINE (PF) 1 %-1:200000 IJ SOLN
INTRAMUSCULAR | Status: AC
  Filled 2015-07-30: qty 30

## 2015-07-30 MED ORDER — SODIUM CHLORIDE 0.9 % IV SOLN
INTRAVENOUS | Status: DC
Start: 2015-07-30 — End: 2015-07-30

## 2015-07-30 MED ORDER — FENTANYL CITRATE (PF) 100 MCG/2ML IJ SOLN
INTRAMUSCULAR | Status: DC | PRN
  Administered 2015-07-30 (×2): 50 ug via INTRAVENOUS

## 2015-07-30 MED ORDER — CEFAZOLIN SODIUM 1-5 GM-% IV SOLN
INTRAVENOUS | Status: AC
  Filled 2015-07-30: qty 50

## 2015-07-30 MED ORDER — MIDAZOLAM HCL 2 MG/2ML IJ SOLN
INTRAMUSCULAR | Status: DC | PRN
  Administered 2015-07-30: 1 mg via INTRAVENOUS
  Administered 2015-07-30: 2 mg via INTRAVENOUS

## 2015-07-30 SURGICAL SUPPLY — 14 items
BALLN DORADO 8X60X80 (BALLOONS) ×3
BALLN DORADO 9X80X80 (BALLOONS) ×3
BALLN LUTONIX DCB 7X60X130 (BALLOONS) ×3
BALLOON DORADO 8X60X80 (BALLOONS) ×1 IMPLANT
BALLOON DORADO 9X80X80 (BALLOONS) ×1 IMPLANT
BALLOON LUTONIX DCB 7X60X130 (BALLOONS) ×1 IMPLANT
DEVICE PRESTO INFLATION (MISCELLANEOUS) ×3 IMPLANT
DRAPE BRACHIAL (DRAPES) ×3 IMPLANT
KIT 5FR STIFF NT/TG (MISCELLANEOUS) ×3 IMPLANT
PACK ANGIOGRAPHY (CUSTOM PROCEDURE TRAY) ×3 IMPLANT
SHEATH BRITE TIP 6FRX5.5 (SHEATH) ×3 IMPLANT
SUT MNCRL AB 4-0 PS2 18 (SUTURE) ×3 IMPLANT
TOWEL OR 17X26 4PK STRL BLUE (TOWEL DISPOSABLE) ×3 IMPLANT
WIRE MAGIC TORQUE 260C (WIRE) ×3 IMPLANT

## 2015-07-30 NOTE — Op Note (Signed)
[facility]    OPERATIVE NOTE   PROCEDURE: 1.   Left brachiocephalic arteriovenous fistula cannulation under ultrasound guidance 2.   Left arm fistulagram including central venogram 3.   Percutaneous transluminal angioplasty of cephalic vein subclavian vein confluence with 8 mm diameter high pressure angioplasty balloon 4.   Percutaneous transluminal angioplasty of mid upper arm cephalic vein with 7 mm diameter drug-coated as well as 8 and 9 mm diameter high pressure angioplasty balloons  PRE-OPERATIVE DIAGNOSIS: 1. ESRD 2. Poorly functional left brachiocephalic AVF  POST-OPERATIVE DIAGNOSIS: same as above   SURGEON: [name], MD  ANESTHESIA: local with MCS  ESTIMATED BLOOD LOSS: Minimal  FINDING(S): 1. 2 areas of separate and distinct stenosis, both improved with angioplasty  SPECIMEN(S):  None  CONTRAST: 35 cc  INDICATIONS: Lawrence [name] is a 59 y.o. male who presents with malfunctioning  left brachiocephalic arteriovenous fistula. He has had prolonged bleeding and diminished flow. Noninvasive study showed stenosis somewhat centrally as well as in the mid upper arm cephalic vein. The patient is scheduled for  left arm fistulagram.  The patient is aware the risks include but are not limited to: bleeding, infection, thrombosis of the cannulated access, and possible anaphylactic reaction to the contrast.  The patient is aware of the risks of the procedure and elects to proceed forward.  DESCRIPTION: After full informed written consent was obtained, the patient was brought back to the angiography suite and placed supine upon the angiography table.  The patient was connected to monitoring equipment.  The  left arm was prepped and draped in the standard fashion for a percutaneous access intervention.  Under ultrasound guidance, the  left brachiocephalic arteriovenous fistula was cannulated with a micropuncture needle under direct ultrasound guidance and a  permanent image was performed.  The microwire was advanced into the fistula and the needle was exchanged for the a microsheath.  I then upsized to a 6 Fr Sheath and imaging was performed.  Hand injections were completed to image the access including the central venous system. This demonstrated 2 areas of stenosis that were separate and distinct. The stenosis at the cephalic vein subclavian vein confluence appeared to be moderate in the 60% range. The stenosis in the mid upper arm cephalic vein was the more severe stenosis and associated large collateral branch present. This appeared to be a frozen valve in the stenosis was in the 80% range.  Based on the images, this patient will need intervention to these areas. I then gave the patient 3000 units of intravenous heparin.  I then crossed the stenoses with a Magic Tourqe wire.  Based on the imaging, a 8 mm x 6 cm  high pressure angioplasty balloon was selected for the cephalic vein subclavian vein confluence stenosis.  The balloon was centered around the cephalic vein subclavian vein confluence stenosis and inflated to 12 ATM for 1 minute(s).  On completion imaging, a 30 % residual stenosis was present that was not flow limiting.   I then turned my attention to the separate and distinct mid upper arm cephalic vein stenosis. I initially used a 7 mm diameter by 6 cm length Lutonix drug-coated angioplasty balloon inflated to 12 atm for 1 minute. Greater than 50% residual stenosis was identified so I upsized to the 75m diameter by 6 cm length high pressure angioplasty balloon. Again, suboptimal angiographic result was seen and so I upsized to a 9 mm diameter by 8 cm length high pressure angioplasty balloon inflated to  12 atm for 1 minute. The balloon was deflated and about a 20% residual stenosis was seen on completion angiogram which was not flow limiting. While he balloon was inflated imaging was performed that opacified the arterial anastomosis which was found to be  widely patent.  Based on the completion imaging, no further intervention is necessary.  The wire and balloon were removed from the sheath.  A 4-0 Monocryl purse-string suture was sewn around the sheath.  The sheath was removed while tying down the suture.  A sterile bandage was applied to the puncture site.  COMPLICATIONS: None  CONDITION: Stable   Lawrence [name]  07/30/2015 1:26 PM

## 2015-07-30 NOTE — H&P (Signed)
  [facility] History & Physical Update  The patient was interviewed and re-examined.  The patient's previous History and Physical has been reviewed and is unchanged.  There is no change in the plan of care. We plan to proceed with the scheduled procedure.  Colonel Krauser, MD  07/30/2015, 12:42 PM

## 2015-07-30 NOTE — Discharge Instructions (Signed)

## 2015-07-31 ENCOUNTER — Encounter: Payer: Self-pay | Admitting: Vascular Surgery

## 2015-08-15 ENCOUNTER — Other Ambulatory Visit: Payer: Self-pay | Admitting: Vascular Surgery

## 2015-08-20 ENCOUNTER — Encounter
Admission: RE | Disposition: A | Discharge status: Home / Self Care | Payer: Self-pay | Source: Ambulatory Visit | Attending: Vascular Surgery

## 2015-08-20 ENCOUNTER — Ambulatory Visit
Admission: RE | Admit: 2015-08-20 | Discharge: 2015-08-20 | Disposition: A | Discharge status: Home / Self Care | Payer: Medicare Other | Source: Ambulatory Visit | Attending: Vascular Surgery | Admitting: Vascular Surgery

## 2015-08-20 DIAGNOSIS — Z4901 Encounter for fitting and adjustment of extracorporeal dialysis catheter: Secondary | ICD-10-CM | POA: Insufficient documentation

## 2015-08-20 DIAGNOSIS — E785 Hyperlipidemia, unspecified: Secondary | ICD-10-CM | POA: Diagnosis not present

## 2015-08-20 DIAGNOSIS — Z8673 Personal history of transient ischemic attack (TIA), and cerebral infarction without residual deficits: Secondary | ICD-10-CM | POA: Insufficient documentation

## 2015-08-20 DIAGNOSIS — F209 Schizophrenia, unspecified: Secondary | ICD-10-CM | POA: Insufficient documentation

## 2015-08-20 DIAGNOSIS — I120 Hypertensive chronic kidney disease with stage 5 chronic kidney disease or end stage renal disease: Secondary | ICD-10-CM | POA: Diagnosis not present

## 2015-08-20 DIAGNOSIS — N186 End stage renal disease: Secondary | ICD-10-CM | POA: Insufficient documentation

## 2015-08-20 DIAGNOSIS — E1122 Type 2 diabetes mellitus with diabetic chronic kidney disease: Secondary | ICD-10-CM | POA: Diagnosis not present

## 2015-08-20 DIAGNOSIS — Z992 Dependence on renal dialysis: Secondary | ICD-10-CM | POA: Insufficient documentation

## 2015-08-20 DIAGNOSIS — D649 Anemia, unspecified: Secondary | ICD-10-CM | POA: Diagnosis not present

## 2015-08-20 HISTORY — PX: PERIPHERAL VASCULAR CATHETERIZATION: SHX172C

## 2015-08-20 SURGERY — DIALYSIS/PERMA CATHETER REMOVAL
Anesthesia: Moderate Sedation

## 2015-08-20 MED ORDER — DEXTROSE 5 % IV SOLN
1.5000 g | INTRAVENOUS | Status: DC
  Filled 2015-08-20: qty 1.5

## 2015-08-20 MED ORDER — SODIUM CHLORIDE 0.9 % IV SOLN
INTRAVENOUS | Status: DC
Start: 2015-08-20 — End: 2015-08-20

## 2015-08-20 SURGICAL SUPPLY — 5 items
FCP FG STRG 5.5XNS LF DISP (INSTRUMENTS) ×1
FORCEPS FG STRG 5.5XNS LF DISP (INSTRUMENTS) ×1 IMPLANT
FORCEPS KELLY 5.5 STR (INSTRUMENTS) ×2
TOWEL OR 17X26 4PK STRL BLUE (TOWEL DISPOSABLE) ×3 IMPLANT
TRAY LACERAT/PLASTIC (MISCELLANEOUS) ×3 IMPLANT

## 2015-08-20 NOTE — H&P (Signed)
  [facility] History & Physical Update  The patient was interviewed and re-examined.  The patient's previous History and Physical has been reviewed and is unchanged.  There is no change in the plan of care. We plan to proceed with the scheduled procedure.  DEW,JASON, MD  08/20/2015, 9:03 AM

## 2015-08-20 NOTE — Op Note (Signed)
Operative Note     Preoperative diagnosis:   1. ESRD with functional permanent access  Postoperative diagnosis:  1. ESRD with functional permanent access  Procedure:  Removal of right jugular Permcath  Surgeon:  [name], MD  Anesthesia:  Local  EBL:  Minimal  Indication for the Procedure:  The patient has a functional permanent dialysis access and no longer needs their permcath.  This can be removed.  Risks and benefits are discussed and informed consent is obtained.  Description of the Procedure:  The patient's right neck, chest and existing catheter were sterilely prepped and draped. The area around the catheter was anesthetized copiously with 1% lidocaine. The catheter was dissected out with curved hemostats until the cuff was freed from the surrounding fibrous sheath. The fiber sheath was transected, and the catheter was then removed in its entirety using gentle traction. Pressure was held and sterile dressings were placed. The patient tolerated the procedure well and was taken to the recovery room in stable condition.     Tenya Araque  08/20/2015, 9:20 AM

## 2015-08-20 NOTE — H&P (Signed)
[facility] Admission History & Physical  MRN : [id number]  Lawrence [name] is a 59 y.o. (1956-11-27) male who presents with chief complaint of No chief complaint on file. [name]  History of Present Illness: Patient presents for removal of his PermCath. His left arm AV access is now working well, and he no longer needs his catheter. Removal is planned for today. He has no other specific complaints.  Current Facility-Administered Medications  Medication Dose Route Frequency Provider Last Rate Last Dose  . 0.9 %  sodium chloride infusion   Intravenous Continuous Kimberly A Stegmayer, PA-C      . cefUROXime (ZINACEF) 1.5 g in dextrose 5 % 50 mL IVPB  1.5 g Intravenous 30 min Pre-Op Sela Hua, PA-C        Past Medical History  Diagnosis Date  . Schizophrenia   . Cerebral hemorrhage 2012  . Stroke   . Diabetes mellitus without complication   . Hypertension   . Chronic kidney disease   . Anemia   . Hyperlipidemia     Past Surgical History  Procedure Laterality Date  . Vascular surgery      Fistula placement  . Peripheral vascular catheterization N/A 04/17/2015    Procedure: Dialysis/Perma Catheter Insertion;  Surgeon: Katha Cabal, MD;  Location: [facility];  Service: Cardiovascular;  Laterality: N/A;  . Peripheral vascular catheterization Left 07/30/2015    Procedure: A/V Shuntogram/Fistulagram;  Surgeon: Algernon Huxley, MD;  Location: [facility];  Service: Cardiovascular;  Laterality: Left;  . Peripheral vascular catheterization Left 07/30/2015    Procedure: A/V Shunt Intervention;  Surgeon: Algernon Huxley, MD;  Location: [facility];  Service: Cardiovascular;  Laterality: Left;    Social History Social History  Substance Use Topics  . Smoking status: Never Smoker   . Smokeless tobacco: Never Used  . Alcohol Use: No   no IV drug use  Family History Family History  Problem Relation Age of Onset  . Diabetes Mother    . Kidney cancer Mother   . Diabetes Sister   . Stroke Brother    no bleeding disorders, clotting disorders, or autoimmune diseases  No Known Allergies   REVIEW OF SYSTEMS (Negative unless checked)  Constitutional: [] Weight loss  [] Fever  [] Chills Cardiac: [] Chest pain   [] Chest pressure   [] Palpitations   [] Shortness of breath when laying flat   [] Shortness of breath at rest   [] Shortness of breath with exertion. Vascular:  [] Pain in legs with walking   [] Pain in legs at rest   [] Pain in legs when laying flat   [] Claudication   [] Pain in feet when walking  [] Pain in feet at rest  [] Pain in feet when laying flat   [] History of DVT   [] Phlebitis   [] Swelling in legs   [] Varicose veins   [] Non-healing ulcers Pulmonary:   [] Uses home oxygen   [] Productive cough   [] Hemoptysis   [] Wheeze  [] COPD   [] Asthma Neurologic:  [] Dizziness  [] Blackouts   [] Seizures   [] History of stroke   [] History of TIA  [] Aphasia   [] Temporary blindness   [] Dysphagia   [] Weakness or numbness in arms   [] Weakness or numbness in legs Musculoskeletal:  [] Arthritis   [] Joint swelling   [] Joint pain   [] Low back pain Hematologic:  [] Easy bruising  [] Easy bleeding   [] Hypercoagulable state   [] Anemic  [] Hepatitis Gastrointestinal:  [] Blood in stool   [] Vomiting blood  [] Gastroesophageal  reflux/heartburn   [] Difficulty swallowing. Genitourinary:  [x] Chronic kidney disease   [] Difficult urination  [] Frequent urination  [] Burning with urination   [] Blood in urine Skin:  [] Rashes   [] Ulcers   [] Wounds Psychological:  [] History of anxiety   []  History of major depression.  Physical Examination  Filed Vitals:   08/20/15 0817  BP: 106/70  Pulse: 69  Temp: 97.5 F (36.4 C)  TempSrc: Oral  Height: 5\' 7"  (1.702 m)  Weight: 78.926 kg (174 lb)  SpO2: 96%   Body mass index is 27.25 kg/(m^2). Gen: WD/WN, NAD Head: [place]/AT, No temporalis wasting. Prominent temp pulse not noted. Ear/Nose/Throat: Hearing grossly intact, nares  w/o erythema or drainage, oropharynx w/o Erythema/Exudate,  Eyes: PERRLA, EOMI.  Neck: Supple, no nuchal rigidity.  No bruit or JVD.  Pulmonary:  Good air movement, clear to auscultation bilaterally, no use of accessory muscles.  Cardiac: RRR, normal S1, S2, no Murmurs, rubs or gallops. Vascular: PermCath without erythema or drainage. Thrill and bruit present in A/V access Vessel Right Left  Radial Palpable Palpable                                   Gastrointestinal: soft, non-tender/non-distended. No guarding/reflex.  Musculoskeletal: M/S 5/5 throughout.  Extremities without ischemic changes.  No deformity or atrophy.  Neurologic: CN 2-12 intact. Pain and light touch intact in extremities.  Symmetrical.  Speech is fluent. Motor exam as listed above. Psychiatric: Judgment intact, Mood & affect appropriate for pt's clinical situation. Dermatologic: No rashes or ulcers noted.  No cellulitis or open wounds. Lymph : No Cervical, Axillary, or Inguinal lymphadenopathy.     CBC Lab Results  Component Value Date   WBC 6.7 04/21/2015   HGB 8.3* 04/22/2015   HCT 20.6* 04/21/2015   MCV 90.3 04/21/2015   PLT 70* 04/21/2015    BMET    Component Value Date/Time   NA 139 04/21/2015 0651   NA 140 03/21/2015 1512   K 4.0 04/21/2015 0651   K 4.0 03/21/2015 1512   CL 101 04/21/2015 0651   CL 95* 03/21/2015 1512   CO2 32 04/21/2015 0651   CO2 36* 03/21/2015 1512   GLUCOSE 107* 04/21/2015 0651   GLUCOSE 117* 03/21/2015 1512   BUN 28* 04/21/2015 0651   BUN 64* 03/21/2015 1512   CREATININE 4.61* 04/21/2015 0651   CREATININE 7.71* 03/21/2015 1512   CALCIUM 8.4* 04/21/2015 0651   CALCIUM 8.5* 03/21/2015 1512   GFRNONAA 13* 04/21/2015 0651   GFRNONAA 7* 03/21/2015 1512   GFRNONAA 14* 12/13/2014 1354   GFRAA 15* 04/21/2015 0651   GFRAA 8* 03/21/2015 1512   GFRAA 17* 12/13/2014 1354   CrCl cannot be calculated (Patient has no serum creatinine result on file.).  COAG Lab Results   Component Value Date   INR 1.1 12/13/2014    Radiology No results found.   Assessment/Plan 1. End-stage renal disease with functional dialysis access. Can remove his PermCath at this time. Risks and benefits of removal were discussed and he is agreeable to proceed. 2. Hypertension. Stable. Continue outpatient minutes 3. Diabetes. Stable. Continue outpatient meds   DEW,JASON, MD  08/20/2015 9:04 AM

## 2015-08-20 NOTE — Discharge Instructions (Signed)
Remove dressing in two days, if bleeding noted on dressing apply pressure, if unable to stop bleeding in 10 minutes call ems and bring pt to ER. Bring pt to ER if develops life threatening symptoms. Like symptoms of infection fever, drainage from procedure site.

## 2015-08-23 ENCOUNTER — Encounter: Payer: Self-pay | Admitting: Vascular Surgery

## 2015-09-25 DIAGNOSIS — Z4931 Encounter for adequacy testing for hemodialysis: Secondary | ICD-10-CM | POA: Insufficient documentation

## 2015-12-17 ENCOUNTER — Other Ambulatory Visit: Payer: Medicare Other

## 2015-12-18 ENCOUNTER — Ambulatory Visit: Payer: Medicare Other

## 2016-01-16 ENCOUNTER — Ambulatory Visit
Admission: RE | Admit: 2016-01-16 | Discharge: 2016-01-16 | Disposition: A | Discharge status: Home / Self Care | Payer: Medicare Other | Source: Ambulatory Visit | Attending: Otolaryngology | Admitting: Otolaryngology

## 2016-01-16 DIAGNOSIS — E042 Nontoxic multinodular goiter: Secondary | ICD-10-CM | POA: Diagnosis present

## 2016-01-16 DIAGNOSIS — E041 Nontoxic single thyroid nodule: Secondary | ICD-10-CM

## 2016-02-06 ENCOUNTER — Other Ambulatory Visit: Payer: Self-pay | Admitting: Otolaryngology

## 2016-02-06 DIAGNOSIS — E041 Nontoxic single thyroid nodule: Secondary | ICD-10-CM

## 2016-02-18 ENCOUNTER — Ambulatory Visit: Payer: Medicare Other

## 2016-03-09 DIAGNOSIS — N185 Chronic kidney disease, stage 5: Secondary | ICD-10-CM | POA: Insufficient documentation

## 2016-03-09 DIAGNOSIS — E041 Nontoxic single thyroid nodule: Secondary | ICD-10-CM | POA: Insufficient documentation

## 2016-03-09 DIAGNOSIS — E118 Type 2 diabetes mellitus with unspecified complications: Secondary | ICD-10-CM | POA: Insufficient documentation

## 2016-04-09 ENCOUNTER — Encounter: Payer: Self-pay | Admitting: Urology

## 2016-04-09 ENCOUNTER — Ambulatory Visit (INDEPENDENT_AMBULATORY_CARE_PROVIDER_SITE_OTHER): Payer: Medicare Other | Admitting: Urology

## 2016-04-09 VITALS — BP 112/69 | HR 79 | Ht 67.0 in | Wt 174.0 lb

## 2016-04-09 DIAGNOSIS — R972 Elevated prostate specific antigen [PSA]: Secondary | ICD-10-CM

## 2016-04-09 DIAGNOSIS — F258 Other schizoaffective disorders: Secondary | ICD-10-CM | POA: Insufficient documentation

## 2016-04-09 DIAGNOSIS — F259 Schizoaffective disorder, unspecified: Secondary | ICD-10-CM | POA: Insufficient documentation

## 2016-04-09 NOTE — Progress Notes (Signed)
04/09/2016 9:51 AM   Lawrence [name] [birth year] [id number]  Referring provider: Sharyne Peach, MD [address] [place], [place] [address]  Chief Complaint  Patient presents with  . Elevated PSA    New Patient    HPI: 60 year old male referred by his primary care, Dr. Iona Beard, for further evaluation of elevated PSA. Review of her records indicate that his most recent PSA drawn on 03/11/2016 was 3.2 ng/dL. This is up from 2 years ago at which time his PSA measured 2.3.  He has multiple medical issues including history of schizophrenia, diabetes, hypertension, and end-stage renal disease on dialysis x 1 year ([facility]).  He denies a personal family history of prostate cancer.  No urinary complaints today, voids very little on HD.    He is accompanied today by caregiver from his group home.   PMH: Past Medical History  Diagnosis Date  . Schizophrenia ([place])   . Cerebral hemorrhage ([place]) 2012  . Stroke ([place])   . Diabetes mellitus without complication ([place])   . Hypertension   . Chronic kidney disease   . Anemia   . Hyperlipidemia     Surgical History: Past Surgical History  Procedure Laterality Date  . Vascular surgery      Fistula placement  . Peripheral vascular catheterization N/A 04/17/2015    Procedure: Dialysis/Perma Catheter Insertion;  Surgeon: Katha Cabal, MD;  Location: [facility];  Service: Cardiovascular;  Laterality: N/A;  . Peripheral vascular catheterization Left 07/30/2015    Procedure: A/V Shuntogram/Fistulagram;  Surgeon: Algernon Huxley, MD;  Location: [facility];  Service: Cardiovascular;  Laterality: Left;  . Peripheral vascular catheterization Left 07/30/2015    Procedure: A/V Shunt Intervention;  Surgeon: Algernon Huxley, MD;  Location: [facility];  Service: Cardiovascular;  Laterality: Left;  . Peripheral vascular catheterization N/A 08/20/2015    Procedure: DIALYSIS/PERMA CATHETER REMOVAL;  Surgeon: Algernon Huxley, MD;   Location: [facility];  Service: Cardiovascular;  Laterality: N/A;    Home Medications:    Medication List       This list is accurate as of: 04/09/16  9:51 AM.  Always use your most recent med list.               aspirin EC 81 MG tablet  Take 81 mg by mouth daily.     benzonatate 100 MG capsule  Commonly known as:  TESSALON  Take 100 mg by mouth 3 (three) times daily as needed for cough.     benztropine 0.5 MG tablet  Commonly known as:  COGENTIN  Take 0.5 mg by mouth 2 (two) times daily.     carvedilol 25 MG tablet  Commonly known as:  COREG  Take 25 mg by mouth 2 (two) times daily with a meal.     cetirizine 10 MG tablet  Commonly known as:  ZYRTEC  Take 10 mg by mouth daily. At Bedtime     cloNIDine 0.3 MG tablet  Commonly known as:  CATAPRES  Take 0.3 mg by mouth 2 (two) times daily.     co-enzyme Q-10 50 MG capsule  Take 100 mg by mouth daily.     cyclobenzaprine 10 MG tablet  Commonly known as:  FLEXERIL  Take 10 mg by mouth 3 (three) times daily as needed for muscle spasms. PRN once daily     docusate sodium 100 MG capsule  Commonly known as:  COLACE  Take 100 mg by  mouth 2 (two) times daily.     feeding supplement (NEPRO CARB STEADY) Liqd  Take 237 mLs by mouth daily.     glipiZIDE 5 MG tablet  Commonly known as:  GLUCOTROL  Take 5 mg by mouth daily before breakfast.     haloperidol 5 MG tablet  Commonly known as:  HALDOL  Take 7.5 mg by mouth. At bedtime     hydrALAZINE 100 MG tablet  Commonly known as:  APRESOLINE  Take 1 tablet (100 mg total) by mouth 3 (three) times daily.     HYDROcodone-acetaminophen 5-325 MG tablet  Commonly known as:  NORCO/VICODIN  Take 1 tablet by mouth every 4 (four) hours as needed for moderate pain.     isosorbide mononitrate 60 MG 24 hr tablet  Commonly known as:  IMDUR  Take 90 mg by mouth daily.     lactulose 10 GM/15ML solution  Commonly known as:  CHRONULAC  Take 10 g by mouth daily.      losartan 100 MG tablet  Commonly known as:  COZAAR  Take 1 tablet (100 mg total) by mouth daily.     multivitamin with minerals tablet  Take 1 tablet by mouth daily.     niacin 1000 MG CR tablet  Commonly known as:  NIASPAN  Take 1,000 mg by mouth at bedtime.     pravastatin 80 MG tablet  Commonly known as:  PRAVACHOL  Take 80 mg by mouth daily. At bedtime     Vitamin D (Ergocalciferol) 50000 units Caps capsule  Commonly known as:  DRISDOL  Take 50,000 Units by mouth every 7 (seven) days.        Allergies:  Allergies  Allergen Reactions  . Nsaids Other (See Comments)    Cannot take due to renal disease    Family History: Family History  Problem Relation Age of Onset  . Diabetes Mother   . Kidney cancer Mother   . Diabetes Sister   . Stroke Brother   . Prostate cancer Neg Hx   . Bladder Cancer Neg Hx     Social History:  reports that he has never smoked. He has never used smokeless tobacco. He reports that he does not drink alcohol or use illicit drugs.  ROS: UROLOGY Frequent Urination?: No Hard to postpone urination?: No Burning/pain with urination?: No Get up at night to urinate?: No Leakage of urine?: No Urine stream starts and stops?: No Trouble starting stream?: No Do you have to strain to urinate?: No Blood in urine?: No Urinary tract infection?: No Sexually transmitted disease?: No Injury to kidneys or bladder?: No Painful intercourse?: No Weak stream?: No Erection problems?: No Penile pain?: No  Gastrointestinal Nausea?: No Vomiting?: No Indigestion/heartburn?: No Diarrhea?: No Constipation?: No  Constitutional Fever: No Night sweats?: No Weight loss?: No Fatigue?: No  Skin Skin rash/lesions?: No Itching?: No  Eyes Blurred vision?: No Double vision?: No  Ears/Nose/Throat Sore throat?: No Sinus problems?: No  Hematologic/Lymphatic Swollen glands?: No Easy bruising?: No  Cardiovascular Leg swelling?: No Chest pain?:  No  Respiratory Cough?: No Shortness of breath?: No  Endocrine Excessive thirst?: No  Musculoskeletal Back pain?: No Joint pain?: No  Neurological Headaches?: No Dizziness?: No  Psychologic Depression?: No Anxiety?: No  Physical Exam: BP 112/69 mmHg  Pulse 79  Ht 5\' 7"  (1.702 m)  Wt 174 lb (78.926 kg)  BMI 27.25 kg/m2  Constitutional:  Alert and oriented, No acute distress. HEENT: [place] AT, moist mucus membranes.  Trachea  midline, no masses. Cardiovascular: No clubbing, cyanosis, or edema. Respiratory: Normal respiratory effort, no increased work of breathing. GI: Abdomen is soft, nontender, nondistended, no abdominal masses GU: No CVA tenderness.  Rectal: Normal sphincter tone. 30 cc rubbery prostate, nontender, no nodules. Skin: No rashes, bruises or suspicious lesions. Neurologic: Grossly intact, no focal deficits, moving all 4 extremities. Psychiatric: Normal mood and affect.  Laboratory Data: Lab Results  Component Value Date   WBC 6.7 04/21/2015   HGB 8.3* 04/22/2015   HCT 20.6* 04/21/2015   MCV 90.3 04/21/2015   PLT 70* 04/21/2015    Lab Results  Component Value Date   CREATININE 4.61* 04/21/2015    Assessment & Plan:    1. Elevated PSA 61 year old male with a PSA of 3.2. This is not up significantly from 2 years ago. Given his overall medical comorbidities including end-stage renal disease on dialysis, history of stroke, etc,  I will not likely recommend any further workup or treatment for this at this time. Rectal exam today was benign. PSA repeated to ensure its validity.   We reviewed the implications of an elevated PSA and the uncertainty surrounding it. In general, a man's PSA increases with age and is produced by both normal and cancerous prostate tissue. Differential for elevated PSA is BPH, prostate cancer, infection, recent intercourse/ejaculation, prostate infarction, recent urethroscopic manipulation (foley placement/cystoscopy) and  prostatitis. Management of an elevated PSA can include observation or prostate biopsy and were discussed this in detail. We discussed that indications for prostate biopsy are defined by age and race specific PSA cutoffs as well as a PSA velocity of 0.75/year.  Plan to call patient/group home with repeat PSA value. Unless significantly higher, no need for biopsy or further intervention. Given patient's medical comorbidities including history of end-stage renal disease on dialysis, his life expectancy is somewhat limited.  The goal in pursuing biopsy would be to prevent clinically significant/metastatic disease.   As such, I have a very high threshold for proceeding biopsy.  Patient understands and aggreeable with this plan.    - PSA   Follow up as needed.  Will call with PSA results.   Hollice Espy, MD  [facility] [address], [place], [place] [address] [phone]

## 2016-04-10 ENCOUNTER — Telehealth: Payer: Self-pay

## 2016-04-10 LAB — PSA: Prostate Specific Ag, Serum: 3.9 ng/mL (ref 0.0–4.0)

## 2016-04-10 NOTE — Telephone Encounter (Signed)
Spoke with director of [name] and made aware of PSA results and need of f/u in 1 year. Director requested to make appt now. Director was transferred to the front to make f/u appt.

## 2016-04-10 NOTE — Telephone Encounter (Signed)
-----   Message from Hollice Espy, MD sent at 04/10/2016 12:32 PM EDT ----- Please call this patient's group home and let them know his PSA is slightly more elevated.  I would like to see him back in 1 year for PSA/ DRE.  No need for biopsy at this time given his medial history.    Hollice Espy, MD

## 2016-04-28 IMAGING — XA NM PERIPHERAL VASCULAR CATHETERIZATION - [facility] NRPT
1 series · 1 of 1 positions shown · non-contrast
Comparison: none

[Series 1: single · 1 of 1 slices shown]
[im 1/1]
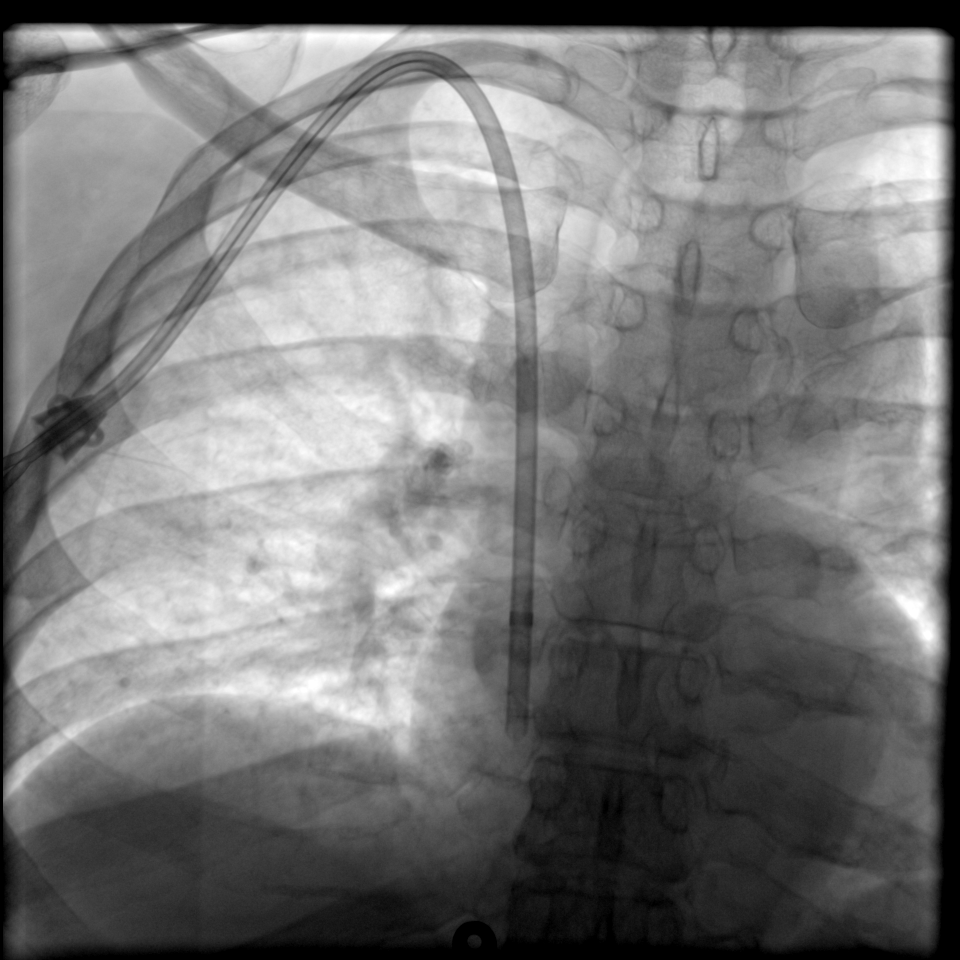

[1 of 1 positions shown; findings below may reference images not displayed]

Canned report from images found in remote index.

Refer to host system for actual result text.

## 2017-02-04 ENCOUNTER — Ambulatory Visit (INDEPENDENT_AMBULATORY_CARE_PROVIDER_SITE_OTHER): Payer: Self-pay | Admitting: Vascular Surgery

## 2017-02-04 ENCOUNTER — Other Ambulatory Visit (INDEPENDENT_AMBULATORY_CARE_PROVIDER_SITE_OTHER): Payer: Self-pay | Admitting: Vascular Surgery

## 2017-02-04 ENCOUNTER — Ambulatory Visit (INDEPENDENT_AMBULATORY_CARE_PROVIDER_SITE_OTHER): Payer: Medicare Other

## 2017-02-04 DIAGNOSIS — N185 Chronic kidney disease, stage 5: Secondary | ICD-10-CM

## 2017-02-04 DIAGNOSIS — T82590A Other mechanical complication of surgically created arteriovenous fistula, initial encounter: Secondary | ICD-10-CM

## 2017-02-12 ENCOUNTER — Telehealth (INDEPENDENT_AMBULATORY_CARE_PROVIDER_SITE_OTHER): Payer: Self-pay | Admitting: Vascular Surgery

## 2017-02-12 NOTE — Telephone Encounter (Signed)
Spoke with patients family member also group home representative. Discussed results. Explained increasing size of aneurysms and possible area of narrowing in fistula are concerning. Explained need for fistulogram to assess fistula to correct area of narrowing and possible need for revisions. Family member is in agreement. Group home aware.

## 2017-02-13 ENCOUNTER — Encounter (INDEPENDENT_AMBULATORY_CARE_PROVIDER_SITE_OTHER): Payer: Self-pay

## 2017-02-19 ENCOUNTER — Other Ambulatory Visit (INDEPENDENT_AMBULATORY_CARE_PROVIDER_SITE_OTHER): Payer: Self-pay | Admitting: Vascular Surgery

## 2017-02-23 MED ORDER — CEFAZOLIN IN D5W 1 GM/50ML IV SOLN
1.0000 g | Freq: Once | INTRAVENOUS | Status: AC
  Administered 2017-02-24: 1 g via INTRAVENOUS

## 2017-02-24 ENCOUNTER — Ambulatory Visit
Admission: RE | Admit: 2017-02-24 | Discharge: 2017-02-24 | Disposition: A | Discharge status: Home / Self Care | Payer: Medicare Other | Source: Ambulatory Visit | Attending: Vascular Surgery | Admitting: Vascular Surgery

## 2017-02-24 ENCOUNTER — Encounter
Admission: RE | Disposition: A | Discharge status: Home / Self Care | Payer: Self-pay | Source: Ambulatory Visit | Attending: Vascular Surgery

## 2017-02-24 DIAGNOSIS — Z833 Family history of diabetes mellitus: Secondary | ICD-10-CM | POA: Diagnosis not present

## 2017-02-24 DIAGNOSIS — Z823 Family history of stroke: Secondary | ICD-10-CM | POA: Insufficient documentation

## 2017-02-24 DIAGNOSIS — N186 End stage renal disease: Secondary | ICD-10-CM | POA: Diagnosis not present

## 2017-02-24 DIAGNOSIS — I120 Hypertensive chronic kidney disease with stage 5 chronic kidney disease or end stage renal disease: Secondary | ICD-10-CM | POA: Insufficient documentation

## 2017-02-24 DIAGNOSIS — E1122 Type 2 diabetes mellitus with diabetic chronic kidney disease: Secondary | ICD-10-CM | POA: Diagnosis not present

## 2017-02-24 DIAGNOSIS — I2510 Atherosclerotic heart disease of native coronary artery without angina pectoris: Secondary | ICD-10-CM | POA: Diagnosis not present

## 2017-02-24 DIAGNOSIS — Z8673 Personal history of transient ischemic attack (TIA), and cerebral infarction without residual deficits: Secondary | ICD-10-CM | POA: Diagnosis not present

## 2017-02-24 DIAGNOSIS — Z992 Dependence on renal dialysis: Secondary | ICD-10-CM | POA: Diagnosis not present

## 2017-02-24 DIAGNOSIS — F209 Schizophrenia, unspecified: Secondary | ICD-10-CM | POA: Diagnosis not present

## 2017-02-24 DIAGNOSIS — Z8679 Personal history of other diseases of the circulatory system: Secondary | ICD-10-CM | POA: Diagnosis not present

## 2017-02-24 DIAGNOSIS — Y832 Surgical operation with anastomosis, bypass or graft as the cause of abnormal reaction of the patient, or of later complication, without mention of misadventure at the time of the procedure: Secondary | ICD-10-CM | POA: Diagnosis not present

## 2017-02-24 DIAGNOSIS — T82858A Stenosis of vascular prosthetic devices, implants and grafts, initial encounter: Secondary | ICD-10-CM | POA: Insufficient documentation

## 2017-02-24 DIAGNOSIS — E785 Hyperlipidemia, unspecified: Secondary | ICD-10-CM | POA: Insufficient documentation

## 2017-02-24 DIAGNOSIS — D631 Anemia in chronic kidney disease: Secondary | ICD-10-CM | POA: Insufficient documentation

## 2017-02-24 DIAGNOSIS — Z9889 Other specified postprocedural states: Secondary | ICD-10-CM | POA: Insufficient documentation

## 2017-02-24 DIAGNOSIS — Z886 Allergy status to analgesic agent status: Secondary | ICD-10-CM | POA: Insufficient documentation

## 2017-02-24 DIAGNOSIS — Z8051 Family history of malignant neoplasm of kidney: Secondary | ICD-10-CM | POA: Insufficient documentation

## 2017-02-24 HISTORY — PX: A/V FISTULAGRAM: CATH118298

## 2017-02-24 LAB — POTASSIUM (ARMC VASCULAR LAB ONLY): Potassium (ARMC vascular lab): 3.5 (ref 3.5–5.1)

## 2017-02-24 SURGERY — A/V FISTULAGRAM
Anesthesia: Moderate Sedation | Laterality: Left

## 2017-02-24 MED ORDER — FAMOTIDINE 20 MG PO TABS: 40.0000 mg | ORAL_TABLET | ORAL | Status: DC | PRN

## 2017-02-24 MED ORDER — LABETALOL HCL 5 MG/ML IV SOLN: 10.0000 mg | Freq: Once | INTRAVENOUS | Status: DC

## 2017-02-24 MED ORDER — MIDAZOLAM HCL 2 MG/2ML IJ SOLN
INTRAMUSCULAR | Status: DC | PRN
  Administered 2017-02-24 (×2): 1 mg via INTRAVENOUS

## 2017-02-24 MED ORDER — METHYLPREDNISOLONE SODIUM SUCC 125 MG IJ SOLR: 125.0000 mg | INTRAMUSCULAR | Status: DC | PRN

## 2017-02-24 MED ORDER — SODIUM CHLORIDE 0.9 % IV SOLN
INTRAVENOUS | Status: DC
  Administered 2017-02-24: 15:00:00 via INTRAVENOUS

## 2017-02-24 MED ORDER — ONDANSETRON HCL 4 MG/2ML IJ SOLN: 4.0000 mg | Freq: Four times a day (QID) | INTRAMUSCULAR | Status: DC | PRN

## 2017-02-24 MED ORDER — MIDAZOLAM HCL 2 MG/2ML IJ SOLN
INTRAMUSCULAR | Status: AC
  Filled 2017-02-24: qty 2

## 2017-02-24 MED ORDER — FENTANYL CITRATE (PF) 100 MCG/2ML IJ SOLN
INTRAMUSCULAR | Status: AC
  Filled 2017-02-24: qty 2

## 2017-02-24 MED ORDER — HEPARIN SODIUM (PORCINE) 1000 UNIT/ML IJ SOLN
INTRAMUSCULAR | Status: AC
  Filled 2017-02-24: qty 1

## 2017-02-24 MED ORDER — FENTANYL CITRATE (PF) 100 MCG/2ML IJ SOLN
INTRAMUSCULAR | Status: DC | PRN
  Administered 2017-02-24 (×2): 50 ug via INTRAVENOUS

## 2017-02-24 MED ORDER — HYDRALAZINE HCL 20 MG/ML IJ SOLN
INTRAMUSCULAR | Status: AC
  Filled 2017-02-24: qty 1

## 2017-02-24 MED ORDER — LIDOCAINE HCL (PF) 1 % IJ SOLN
INTRAMUSCULAR | Status: AC
  Filled 2017-02-24: qty 30

## 2017-02-24 MED ORDER — HYDROMORPHONE HCL 1 MG/ML IJ SOLN
1.0000 mg | Freq: Once | INTRAMUSCULAR | Status: DC
Start: 2017-02-24 — End: 2017-02-24

## 2017-02-24 MED ORDER — HYDRALAZINE HCL 20 MG/ML IJ SOLN
INTRAMUSCULAR | Status: DC | PRN
  Administered 2017-02-24: 20 mg via INTRAVENOUS

## 2017-02-24 MED ORDER — LABETALOL HCL 5 MG/ML IV SOLN
INTRAVENOUS | Status: AC
  Filled 2017-02-24: qty 4

## 2017-02-24 MED ORDER — HEPARIN SODIUM (PORCINE) 1000 UNIT/ML IJ SOLN
INTRAMUSCULAR | Status: DC | PRN
Start: 2017-02-24 — End: 2017-02-24
  Administered 2017-02-24: 3000 [IU] via INTRAVENOUS

## 2017-02-24 MED ORDER — IOPAMIDOL (ISOVUE-300) INJECTION 61%
INTRAVENOUS | Status: DC | PRN
  Administered 2017-02-24: 30 mL via INTRA_ARTERIAL

## 2017-02-24 SURGICAL SUPPLY — 12 items
BALLN DORADO 7X40X80 (BALLOONS) ×3
BALLN LUTONIX AV 10X60X75 (BALLOONS) ×3
BALLOON DORADO 7X40X80 (BALLOONS) ×1 IMPLANT
BALLOON LUTONIX AV 10X60X75 (BALLOONS) ×1 IMPLANT
DEVICE PRESTO INFLATION (MISCELLANEOUS) ×3 IMPLANT
NEEDLE ENTRY 21GA 7CM ECHOTIP (NEEDLE) ×3 IMPLANT
PACK ANGIOGRAPHY (CUSTOM PROCEDURE TRAY) ×3 IMPLANT
SET INTRO CAPELLA COAXIAL (SET/KITS/TRAYS/PACK) ×3 IMPLANT
SHEATH BRITE TIP 6FRX5.5 (SHEATH) ×3 IMPLANT
SHEATH BRITE TIP 7FRX5.5 (SHEATH) ×3 IMPLANT
SUT MNCRL AB 4-0 PS2 18 (SUTURE) ×3 IMPLANT
WIRE MAGIC TORQUE 260C (WIRE) ×3 IMPLANT

## 2017-02-24 NOTE — Op Note (Signed)
OPERATIVE NOTE   PROCEDURE: 1. Contrast injection left brachiocephalic  AV access 2. Percutaneous transluminal angioplasty cephalic vein to 10 mm with a Lutonix drug-eluting balloon  PRE-OPERATIVE DIAGNOSIS: Complication of dialysis access                                                       End Stage Renal Disease  POST-OPERATIVE DIAGNOSIS: same as above   SURGEON: Katha Cabal, M.D.  ANESTHESIA: Conscious sedation was administered under my direct supervision. IV Versed plus fentanyl were utilized. Continuous ECG, pulse oximetry and blood pressure was monitored throughout the entire procedure.  Conscious sedation was for a total of 25.  ESTIMATED BLOOD LOSS: minimal  FINDING(S): Stricture of the AV graft  SPECIMEN(S):  None  CONTRAST: 30 cc  FLUOROSCOPY TIME: 1.4 minutes  INDICATIONS: Lawrence [name] is a 61 y.o. male who  presents with malfunctioning left brachiocephalic AV access.  The patient is scheduled for angiography with possible intervention of the AV access.  The patient is aware the risks include but are not limited to: bleeding, infection, thrombosis of the cannulated access, and possible anaphylactic reaction to the contrast.  The patient acknowledges if the access can not be salvaged a tunneled catheter will be needed and will be placed during this procedure.  The patient is aware of the risks of the procedure and elects to proceed with the angiogram and intervention.  DESCRIPTION: After full informed written consent was obtained, the patient was brought back to the Special Procedure suite and placed supine position.  Appropriate cardiopulmonary monitors were placed.  The left arm was prepped and draped in the standard fashion.  Appropriate timeout is called. The left brachycephalic fistula  was cannulated with a micropuncture needle.  Cannulation was performed with ultrasound guidance. Ultrasound was placed in a sterile sleeve, the AV access was interrogated and  noted to be echolucent and compressible indicating patency. Image was recorded for the permanent record. The puncture is performed under continuous ultrasound visualization.   The microwire was advanced and the needle was exchanged for  a microsheath.  The J-wire was then advanced and a 6 Fr sheath inserted.  Hand injections were completed to image the access from the arterial anastomosis through the entire access.  The central venous structures were also imaged by hand injections.  Based on the images,  3000 units of heparin was given and a wire was negotiated through the strictures within the venous portion of the graft.  An 8 x 60 [place] balloon was used.  Inflation was to 20 atm for 1 minute.  Follow-up angiogram demonstrated persistent residual stenosis and therefore a 10 mm x 60 mm Lutonix drug-eluting balloon was advanced across the lesion and inflated to 14 atm for 2 minutes. Follow-up imaging demonstrated an excellent result with less than 10% residual stenosis.  Follow-up imaging demonstrates complete resolution of the stricture with rapid flow of contrast through the graft, the central venous anatomy is preserved.  A 4-0 Monocryl purse-string suture was sewn around the sheath.  The sheath was removed and light pressure was applied.  A sterile bandage was applied to the puncture site.    COMPLICATIONS: None  CONDITION: [name], M.D [facility] Office: [phone]  02/24/2017 4:50 PM

## 2017-02-24 NOTE — H&P (Signed)
[facility] Admission History & Physical  MRN : [id number]  Lawrence [name] is a 61 y.o. (1956-11-29) male who presents with chief complaint of No chief complaint on file. [name]  History of Present Illness: I am asked to evaluate the patient by the dialysis center. The patient was sent here because they were unable to achieve adequate dialysis this morning. Furthermore the Center states there is very poor thrill and bruit. The patient states there there have been increasing problems with the access, such as "pulling clots" during dialysis and prolonged bleeding after decannulation. The patient estimates these problems have been going on for several weeks. The patient is unaware of any other change.  Patient denies pain or tenderness overlying the access.  There is no pain with dialysis.  The patient denies hand pain or finger pain consistent with steal syndrome.   There have been past interventions of this access.  The patient is not chronically hypotensive on dialysis.  Current Facility-Administered Medications  Medication Dose Route Frequency Provider Last Rate Last Dose  . 0.9 %  sodium chloride infusion   Intravenous Continuous Kimberly A Stegmayer, PA-C 10 mL/hr at 02/24/17 1458    . ceFAZolin (ANCEF) IVPB 1 g/50 mL premix  1 g Intravenous Once [employer], PA-C   1 g at 02/24/17 1612  . famotidine (PEPCID) tablet 40 mg  40 mg Oral PRN Janalyn Harder Stegmayer, PA-C      . HYDROmorphone (DILAUDID) injection 1 mg  1 mg Intravenous Once [employer], PA-C      . methylPREDNISolone sodium succinate (SOLU-MEDROL) 125 mg/2 mL injection 125 mg  125 mg Intravenous PRN Kimberly A Stegmayer, PA-C      . ondansetron (ZOFRAN) injection 4 mg  4 mg Intravenous Q6H PRN Sela Hua, PA-C        Past Medical History:  Diagnosis Date  . Anemia   . Cerebral hemorrhage ([place]) 2012  . Chronic kidney disease   . Diabetes mellitus without complication ([place])   .  Hyperlipidemia   . Hypertension   . Schizophrenia ([place])   . Stroke [facility])     Past Surgical History:  Procedure Laterality Date  . PERIPHERAL VASCULAR CATHETERIZATION N/A 04/17/2015   Procedure: Dialysis/Perma Catheter Insertion;  Surgeon: Katha Cabal, MD;  Location: [facility];  Service: Cardiovascular;  Laterality: N/A;  . PERIPHERAL VASCULAR CATHETERIZATION Left 07/30/2015   Procedure: A/V Shuntogram/Fistulagram;  Surgeon: Algernon Huxley, MD;  Location: [facility];  Service: Cardiovascular;  Laterality: Left;  . PERIPHERAL VASCULAR CATHETERIZATION Left 07/30/2015   Procedure: A/V Shunt Intervention;  Surgeon: Algernon Huxley, MD;  Location: [facility];  Service: Cardiovascular;  Laterality: Left;  . PERIPHERAL VASCULAR CATHETERIZATION N/A 08/20/2015   Procedure: DIALYSIS/PERMA CATHETER REMOVAL;  Surgeon: Algernon Huxley, MD;  Location: [facility];  Service: Cardiovascular;  Laterality: N/A;  . VASCULAR SURGERY     Fistula placement    Social History Social History  Substance Use Topics  . Smoking status: Never Smoker  . Smokeless tobacco: Never Used  . Alcohol use No    Family History Family History  Problem Relation Age of Onset  . Diabetes Mother   . Kidney cancer Mother   . Diabetes Sister   . Stroke Brother   . Prostate cancer Neg Hx   . Bladder Cancer Neg Hx     No family history of bleeding or clotting disorders, autoimmune disease or  porphyria  Allergies  Allergen Reactions  . Nsaids Other (See Comments)    Cannot take due to renal disease     REVIEW OF SYSTEMS (Negative unless checked)  Constitutional: [] Weight loss  [] Fever  [] Chills Cardiac: [] Chest pain   [] Chest pressure   [] Palpitations   [] Shortness of breath when laying flat   [] Shortness of breath at rest   [x] Shortness of breath with exertion. Vascular:  [] Pain in legs with walking   [] Pain in legs at rest   [] Pain in legs when laying flat   [] Claudication   [] Pain in  feet when walking  [] Pain in feet at rest  [] Pain in feet when laying flat   [] History of DVT   [] Phlebitis   [] Swelling in legs   [] Varicose veins   [] Non-healing ulcers Pulmonary:   [] Uses home oxygen   [] Productive cough   [] Hemoptysis   [] Wheeze  [] COPD   [] Asthma Neurologic:  [] Dizziness  [] Blackouts   [] Seizures   [] History of stroke   [] History of TIA  [] Aphasia   [] Temporary blindness   [] Dysphagia   [] Weakness or numbness in arms   [] Weakness or numbness in legs Musculoskeletal:  [] Arthritis   [] Joint swelling   [] Joint pain   [] Low back pain Hematologic:  [] Easy bruising  [] Easy bleeding   [] Hypercoagulable state   [] Anemic  [] Hepatitis Gastrointestinal:  [] Blood in stool   [] Vomiting blood  [] Gastroesophageal reflux/heartburn   [] Difficulty swallowing. Genitourinary:  [x] Chronic kidney disease   [] Difficult urination  [] Frequent urination  [] Burning with urination   [] Blood in urine Skin:  [] Rashes   [] Ulcers   [] Wounds Psychological:  [] History of anxiety   []  History of major depression.  Physical Examination  Vitals:   02/24/17 1433  BP: (!) 180/100  Pulse: 63  Resp: 16  Temp: 98 F (36.7 C)  TempSrc: Oral  SpO2: 97%  Weight: 78.9 kg (174 lb)  Height: 5\' 8"  (1.727 m)   Body mass index is 26.46 kg/m. Gen: WD/WN, NAD Head: [place]/AT, No temporalis wasting. Prominent temp pulse not noted. Ear/Nose/Throat: Hearing grossly intact, nares w/o erythema or drainage, oropharynx w/o Erythema/Exudate,  Eyes: Conjunctiva clear, sclera non-icteric Neck: Trachea midline.  No JVD.  Pulmonary:  Good air movement, respirations not labored, no use of accessory muscles.  Cardiac: RRR, normal S1, S2. Vascular: Left arm brachial cephalic fistula with a weak thrill and poor bruit Vessel Right Left  Radial Palpable Palpable  Ulnar Not Palpable Not Palpable  Brachial Palpable Palpable  Carotid Palpable, without bruit Palpable, without bruit  Gastrointestinal: soft, non-tender/non-distended.  No guarding/reflex.  Musculoskeletal: M/S 5/5 throughout.  Extremities without ischemic changes.  No deformity or atrophy.  Neurologic: Sensation grossly intact in extremities.  Symmetrical.  Speech is fluent. Motor exam as listed above. Psychiatric: Judgment intact, Mood & affect appropriate for pt's clinical situation. Dermatologic: No rashes or ulcers noted.  No cellulitis or open wounds. Lymph : No Cervical, Axillary, or Inguinal lymphadenopathy.   CBC Lab Results  Component Value Date   WBC 6.7 04/21/2015   HGB 8.3 (L) 04/22/2015   HCT 20.6 (L) 04/21/2015   MCV 90.3 04/21/2015   PLT 70 (L) 04/21/2015    BMET    Component Value Date/Time   NA 139 04/21/2015 0651   NA 140 03/21/2015 1512   K 4.0 04/21/2015 0651   K 4.0 03/21/2015 1512   CL 101 04/21/2015 0651   CL 95 (L) 03/21/2015 1512   CO2 32 04/21/2015 0651   CO2  36 (H) 03/21/2015 1512   GLUCOSE 107 (H) 04/21/2015 0651   GLUCOSE 117 (H) 03/21/2015 1512   BUN 28 (H) 04/21/2015 0651   BUN 64 (H) 03/21/2015 1512   CREATININE 4.61 (H) 04/21/2015 0651   CREATININE 7.71 (H) 03/21/2015 1512   CALCIUM 8.4 (L) 04/21/2015 0651   CALCIUM 8.5 (L) 03/21/2015 1512   GFRNONAA 13 (L) 04/21/2015 0651   GFRNONAA 7 (L) 03/21/2015 1512   GFRAA 15 (L) 04/21/2015 0651   GFRAA 8 (L) 03/21/2015 1512   CrCl cannot be calculated (Patient's most recent lab result is older than the maximum 21 days allowed.).  COAG Lab Results  Component Value Date   INR 1.1 12/13/2014    Radiology No results found.  Assessment/Plan 1.  Complication dialysis device with thrombosis AV access:  Patient's Left arm dialysis access is malfunctioning. The patient will undergo angiography and correction of any problems using interventional techniques with the hope of restoring function to the access.  The risks and benefits were described to the patient.  All questions were answered.  The patient agrees to proceed with angiography and intervention. Potassium  will be drawn to ensure that it is an appropriate level prior to performing intervention. 2.  End-stage renal disease requiring hemodialysis:  Patient will continue dialysis therapy without further interruption if a successful intervention is not achieved then a tunneled catheter will be placed. Dialysis has already been arranged. 3.  Hypertension:  Patient will continue medical management; nephrology is following no changes in oral medications. 4. Diabetes mellitus:  Glucose will be monitored and oral medications been held this morning once the patient has undergone the patient's procedure po intake will be reinitiated and again Accu-Cheks will be used to assess the blood glucose level and treat as needed. The patient will be restarted on the patient's usual hypoglycemic regime 5.  Coronary artery disease:  EKG will be monitored. Nitrates will be used if needed. The patient's oral cardiac medications will be continued.    Hortencia Pilar, MD  02/24/2017 4:12 PM

## 2017-02-25 ENCOUNTER — Encounter: Payer: Self-pay | Admitting: Vascular Surgery

## 2017-04-10 ENCOUNTER — Ambulatory Visit: Payer: Medicare Other | Admitting: Urology

## 2017-05-01 ENCOUNTER — Ambulatory Visit (INDEPENDENT_AMBULATORY_CARE_PROVIDER_SITE_OTHER): Payer: Medicare Other | Admitting: Urology

## 2017-05-01 ENCOUNTER — Encounter: Payer: Self-pay | Admitting: Urology

## 2017-05-01 VITALS — BP 134/78 | HR 80 | Ht 69.0 in | Wt 160.0 lb

## 2017-05-01 DIAGNOSIS — R972 Elevated prostate specific antigen [PSA]: Secondary | ICD-10-CM | POA: Diagnosis not present

## 2017-05-02 LAB — PSA: Prostate Specific Ag, Serum: 4.1 ng/mL — ABNORMAL HIGH (ref 0.0–4.0)

## 2017-05-05 ENCOUNTER — Telehealth: Payer: Self-pay | Admitting: Urology

## 2017-05-05 NOTE — Telephone Encounter (Signed)
PSA was repeated and is stable from last year, currently now 4.1 from 3.9. Suspect previously elevated PSA related to inflammation. As such, would not recommend pursuing prostate biopsy at this time given his multiple medical comorbidities and PSA stability.  Lab results were discussed with Mrs. Berenice Primas who runs his group home. She will let the patient know of our plan. She is requested his PSA results to be faxed her office at [phone].  Plan for repeat PSA in 1 year.  Hollice Espy, MD

## 2017-05-05 NOTE — Progress Notes (Signed)
05/01/2017 2:48 PM   Lawrence [name] 02-Apr-1956 [id number]  Referring provider: Sharyne Peach, MD [place], [place] [address]  Chief Complaint  Patient presents with  . Elevated PSA    1 year    HPI: 61 year old male followed for elevated PSA who returns today with rising PSA.  His most recent PSA is up to 7.91 and 03/27/2017. PSA trend as below.  Repeat PSA was obtained today.  Prostate exam previously unremarkable.  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  He denies a personal family history of prostate cancer.  No urinary complaints today, voids very little on HD.  He has had recent unintentional weight loss.  He is accompanied today by caregiver from his group home.  PSA trend: 2.83 on 04/19/2013 3.2 on 03/05/2016 3.9 on 04/09/16 7.91 on 03/27/2017   PMH: Past Medical History:  Diagnosis Date  . Anemia   . Cerebral hemorrhage ([place]) 2012  . Chronic kidney disease   . Diabetes mellitus without complication ([place])   . Hyperlipidemia   . Hypertension   . Schizophrenia ([place])   . Stroke [facility])     Surgical History: Past Surgical History:  Procedure Laterality Date  . A/V SHUNTOGRAM Left 02/24/2017   Procedure: A/V Fistulagram;  Surgeon: Katha Cabal, MD;  Location: [place] [facility];  Service: Cardiovascular;  Laterality: Left;  . PERIPHERAL VASCULAR CATHETERIZATION N/A 04/17/2015   Procedure: Dialysis/Perma Catheter Insertion;  Surgeon: Katha Cabal, MD;  Location: [place] [facility];  Service: Cardiovascular;  Laterality: N/A;  . PERIPHERAL VASCULAR CATHETERIZATION Left 07/30/2015   Procedure: A/V Shuntogram/Fistulagram;  Surgeon: Algernon Huxley, MD;  Location: [place] [facility];  Service: Cardiovascular;  Laterality: Left;  . PERIPHERAL VASCULAR CATHETERIZATION Left 07/30/2015   Procedure: A/V Shunt Intervention;  Surgeon: Algernon Huxley,  MD;  Location: [place] [facility];  Service: Cardiovascular;  Laterality: Left;  . PERIPHERAL VASCULAR CATHETERIZATION N/A 08/20/2015   Procedure: DIALYSIS/PERMA CATHETER REMOVAL;  Surgeon: Algernon Huxley, MD;  Location: [place] [facility];  Service: Cardiovascular;  Laterality: N/A;  . VASCULAR SURGERY     Fistula placement    Home Medications:  Allergies as of 05/01/2017      Reactions   Nsaids Other (See Comments)   Cannot take due to renal disease      Medication List       Accurate as of 05/01/17 11:59 PM. Always use your most recent med list.          acetaminophen-codeine 300-30 MG tablet Commonly known as:  TYLENOL #3 Take 1-2 tablets by mouth every 4 (four) hours as needed for moderate pain.   amLODipine 10 MG tablet Commonly known as:  NORVASC Take by mouth.   aspirin EC 81 MG tablet Take 81 mg by mouth daily.   benztropine 0.5 MG tablet Commonly known as:  COGENTIN Take 0.5 mg by mouth 2 (two) times daily.   carvedilol 25 MG tablet Commonly known as:  COREG Take 25 mg by mouth at bedtime.   cetirizine 10 MG tablet Commonly known as:  ZYRTEC Take 10 mg by mouth at bedtime.   cloNIDine 0.3 MG tablet Commonly known as:  CATAPRES Take 0.3 mg by mouth 2 (two) times daily.   Coenzyme Q10 100 MG Tabs Take 100 mg by mouth at bedtime.   docusate sodium 100 MG capsule Commonly known as:  COLACE Take  100 mg by mouth 2 (two) times daily.   glipiZIDE 5 MG 24 hr tablet Commonly known as:  GLUCOTROL XL Take 5 mg by mouth daily with breakfast.   haloperidol 5 MG tablet Commonly known as:  HALDOL Take 7.5 mg by mouth at bedtime. At bedtime   isosorbide mononitrate 60 MG 24 hr tablet Commonly known as:  IMDUR Take 90 mg by mouth daily.   lactulose 10 GM/15ML solution Commonly known as:  CHRONULAC Take 10 g by mouth daily.   lidocaine-prilocaine cream Commonly known as:  EMLA Apply 1 application topically 3 (three) times a week. Apply over dialysis  shunt 30 minutes prior to dialysis.   losartan 100 MG tablet Commonly known as:  COZAAR Take 1 tablet (100 mg total) by mouth daily.   multivitamin with minerals tablet Take 1 tablet by mouth daily.   niacin 1000 MG CR tablet Commonly known as:  NIASPAN Take 1,000 mg by mouth at bedtime.   pravastatin 40 MG tablet Commonly known as:  PRAVACHOL Take 40 mg by mouth at bedtime.   Vitamin D (Ergocalciferol) 50000 units Caps capsule Commonly known as:  DRISDOL Take 50,000 Units by mouth every 7 (seven) days.       Allergies:  Allergies  Allergen Reactions  . Nsaids Other (See Comments)    Cannot take due to renal disease    Family History: Family History  Problem Relation Age of Onset  . Diabetes Mother   . Kidney cancer Mother   . Diabetes Sister   . Stroke Brother   . Prostate cancer Neg Hx   . Bladder Cancer Neg Hx     Social History:  reports that he has never smoked. He has never used smokeless tobacco. He reports that he does not drink alcohol or use drugs.  ROS: UROLOGY Frequent Urination?: No Hard to postpone urination?: No Burning/pain with urination?: No Get up at night to urinate?: Yes Leakage of urine?: No Urine stream starts and stops?: No Trouble starting stream?: No Do you have to strain to urinate?: No Blood in urine?: No Urinary tract infection?: No Sexually transmitted disease?: No Injury to kidneys or bladder?: Yes Painful intercourse?: No Weak stream?: No Erection problems?: No Penile pain?: No  Gastrointestinal Nausea?: No Vomiting?: No Indigestion/heartburn?: No Diarrhea?: No Constipation?: No  Constitutional Fever: No Night sweats?: No Weight loss?: No Fatigue?: No  Skin Skin rash/lesions?: No Itching?: No  Eyes Blurred vision?: No Double vision?: No  Ears/Nose/Throat Sore throat?: No Sinus problems?: No  Hematologic/Lymphatic Swollen glands?: No Easy bruising?: No  Cardiovascular Leg swelling?: No Chest  pain?: No  Respiratory Cough?: No Shortness of breath?: No  Endocrine Excessive thirst?: No  Musculoskeletal Back pain?: No Joint pain?: No  Neurological Headaches?: No Dizziness?: No  Psychologic Depression?: No Anxiety?: No  Physical Exam: BP 134/78   Pulse 80   Ht 5\' 9"  (1.753 m)   Wt 160 lb (72.6 kg)   BMI 23.63 kg/m   Constitutional:  Alert and oriented, No acute distress.   HEENT: [place] AT, moist mucus membranes.  Trachea midline, no masses. Cardiovascular: No clubbing, cyanosis, or edema. Respiratory: Normal respiratory effort, no increased work of breathing. GI: Abdomen is soft, nontender, nondistended, no abdominal masses GU: No CVA tenderness.  Rectal: Normal sphincter tone. 30 cc rubbery prostate, nontender, no nodules. Skin: No rashes, bruises or suspicious lesions. Neurologic: Grossly intact, no focal deficits, moving all 4 extremities. Psychiatric: Normal mood and affect.  Laboratory Data: Lab Results  Component Value Date   WBC 6.7 04/21/2015   HGB 8.3 (L) 04/22/2015   HCT 20.6 (L) 04/21/2015   MCV 90.3 04/21/2015   PLT 70 (L) 04/21/2015    Lab Results  Component Value Date   CREATININE 4.61 (H) 04/21/2015    Assessment & Plan:    1. Elevated PSA Possible rising PSA complex patient with multiple medical comorbidities including history of stroke, end-stage renal disease on dialysis, etc.   Rectal exam today was benign. PSA repeated to ensure its validity.  Plan to call patient/group home with repeat PSA value. Given patient's medical comorbidities including history of end-stage renal disease on dialysis, his life expectancy is somewhat limited.  The goal in pursuing biopsy would be to prevent clinically significant/metastatic disease.   As such, I have a very high threshold for proceeding biopsy.  Patient understands and aggreeable with this plan.    If PSA is back down, will continue to trend his PSA. If it remains elevated or higher, we'll  proceed with prostate biopsy.  If he does need a biopsy, we will need to obtain clearance to hold his antiplatelet therapy prior to the procedure.  We discussed prostate biopsy in detail including the procedure itself, the risks of blood in the urine, stool, and ejaculate, serious infection, and discomfort. He is willing to proceed with this as discussed.   - PSA   Will call with PSA results.   Hollice Espy, MD  [facility] [address] [place],  [id number] [phone]

## 2017-05-22 ENCOUNTER — Other Ambulatory Visit: Payer: Medicare Other | Admitting: Urology

## 2017-06-02 ENCOUNTER — Other Ambulatory Visit: Payer: Medicare Other | Admitting: Urology

## 2017-06-03 ENCOUNTER — Ambulatory Visit: Payer: Medicare Other | Admitting: Urology

## 2017-10-13 ENCOUNTER — Inpatient Hospital Stay (HOSPITAL_COMMUNITY)
Admission: EM | Admit: 2017-10-13 | Discharge: 2017-10-16 | DRG: 689 | Disposition: A | Discharge status: Home / Self Care | Payer: Medicare Other | Attending: Family Medicine | Admitting: Family Medicine

## 2017-10-13 ENCOUNTER — Encounter (HOSPITAL_COMMUNITY): Payer: Self-pay | Admitting: *Deleted

## 2017-10-13 ENCOUNTER — Emergency Department (HOSPITAL_COMMUNITY): Payer: Medicare Other

## 2017-10-13 DIAGNOSIS — N390 Urinary tract infection, site not specified: Secondary | ICD-10-CM | POA: Diagnosis present

## 2017-10-13 DIAGNOSIS — Z79899 Other long term (current) drug therapy: Secondary | ICD-10-CM | POA: Diagnosis not present

## 2017-10-13 DIAGNOSIS — E876 Hypokalemia: Secondary | ICD-10-CM | POA: Diagnosis present

## 2017-10-13 DIAGNOSIS — E785 Hyperlipidemia, unspecified: Secondary | ICD-10-CM | POA: Diagnosis present

## 2017-10-13 DIAGNOSIS — I10 Essential (primary) hypertension: Secondary | ICD-10-CM | POA: Diagnosis not present

## 2017-10-13 DIAGNOSIS — Z8673 Personal history of transient ischemic attack (TIA), and cerebral infarction without residual deficits: Secondary | ICD-10-CM | POA: Diagnosis not present

## 2017-10-13 DIAGNOSIS — D696 Thrombocytopenia, unspecified: Secondary | ICD-10-CM | POA: Diagnosis present

## 2017-10-13 DIAGNOSIS — F259 Schizoaffective disorder, unspecified: Secondary | ICD-10-CM | POA: Diagnosis present

## 2017-10-13 DIAGNOSIS — K5909 Other constipation: Secondary | ICD-10-CM | POA: Diagnosis present

## 2017-10-13 DIAGNOSIS — Z992 Dependence on renal dialysis: Secondary | ICD-10-CM | POA: Diagnosis not present

## 2017-10-13 DIAGNOSIS — N400 Enlarged prostate without lower urinary tract symptoms: Secondary | ICD-10-CM | POA: Diagnosis present

## 2017-10-13 DIAGNOSIS — E1122 Type 2 diabetes mellitus with diabetic chronic kidney disease: Secondary | ICD-10-CM | POA: Diagnosis present

## 2017-10-13 DIAGNOSIS — I120 Hypertensive chronic kidney disease with stage 5 chronic kidney disease or end stage renal disease: Secondary | ICD-10-CM | POA: Diagnosis present

## 2017-10-13 DIAGNOSIS — R509 Fever, unspecified: Secondary | ICD-10-CM

## 2017-10-13 DIAGNOSIS — D631 Anemia in chronic kidney disease: Secondary | ICD-10-CM | POA: Diagnosis present

## 2017-10-13 DIAGNOSIS — M899 Disorder of bone, unspecified: Secondary | ICD-10-CM | POA: Diagnosis present

## 2017-10-13 DIAGNOSIS — R319 Hematuria, unspecified: Secondary | ICD-10-CM

## 2017-10-13 DIAGNOSIS — E871 Hypo-osmolality and hyponatremia: Secondary | ICD-10-CM | POA: Diagnosis not present

## 2017-10-13 DIAGNOSIS — N12 Tubulo-interstitial nephritis, not specified as acute or chronic: Secondary | ICD-10-CM

## 2017-10-13 DIAGNOSIS — F258 Other schizoaffective disorders: Secondary | ICD-10-CM | POA: Diagnosis not present

## 2017-10-13 DIAGNOSIS — Z7982 Long term (current) use of aspirin: Secondary | ICD-10-CM | POA: Diagnosis not present

## 2017-10-13 DIAGNOSIS — E1169 Type 2 diabetes mellitus with other specified complication: Secondary | ICD-10-CM

## 2017-10-13 DIAGNOSIS — E119 Type 2 diabetes mellitus without complications: Secondary | ICD-10-CM

## 2017-10-13 DIAGNOSIS — R109 Unspecified abdominal pain: Secondary | ICD-10-CM

## 2017-10-13 DIAGNOSIS — N186 End stage renal disease: Secondary | ICD-10-CM | POA: Diagnosis present

## 2017-10-13 DIAGNOSIS — Z886 Allergy status to analgesic agent status: Secondary | ICD-10-CM | POA: Diagnosis not present

## 2017-10-13 DIAGNOSIS — R809 Proteinuria, unspecified: Secondary | ICD-10-CM | POA: Diagnosis present

## 2017-10-13 DIAGNOSIS — Z7984 Long term (current) use of oral hypoglycemic drugs: Secondary | ICD-10-CM | POA: Diagnosis not present

## 2017-10-13 HISTORY — DX: Other constipation: K59.09

## 2017-10-13 HISTORY — DX: Dependence on renal dialysis: Z99.2

## 2017-10-13 LAB — BASIC METABOLIC PANEL
Anion gap: 12 (ref 5–15)
BUN: 22 mg/dL — ABNORMAL HIGH (ref 6–20)
CO2: 33 mmol/L — ABNORMAL HIGH (ref 22–32)
Calcium: 9.1 mg/dL (ref 8.9–10.3)
Chloride: 89 mmol/L — ABNORMAL LOW (ref 101–111)
Creatinine, Ser: 5.39 mg/dL — ABNORMAL HIGH (ref 0.61–1.24)
GFR, EST AFRICAN AMERICAN: 12 mL/min — AB (ref >60)
GFR, EST NON AFRICAN AMERICAN: 10 mL/min — AB (ref >60)
GLUCOSE: 97 mg/dL (ref 65–99)
POTASSIUM: 2.9 mmol/L — AB (ref 3.5–5.1)
Sodium: 134 mmol/L — ABNORMAL LOW (ref 135–145)

## 2017-10-13 LAB — URINALYSIS, ROUTINE W REFLEX MICROSCOPIC
GLUCOSE, UA: 250 mg/dL — AB
NITRITE: POSITIVE — AB
PH: 8.5 — AB (ref 5.0–8.0)
Protein, ur: >300 mg/dL — AB
SPECIFIC GRAVITY, URINE: 1.02 (ref 1.005–1.030)

## 2017-10-13 LAB — URINALYSIS, MICROSCOPIC (REFLEX)

## 2017-10-13 LAB — CBC WITH DIFFERENTIAL/PLATELET
BASOS ABS: 0 10*3/uL (ref 0.0–0.1)
BASOS PCT: 0 %
Eosinophils Absolute: 0.1 10*3/uL (ref 0.0–0.7)
Eosinophils Relative: 1 %
HEMATOCRIT: 33.5 % — AB (ref 39.0–52.0)
HEMOGLOBIN: 11.2 g/dL — AB (ref 13.0–17.0)
LYMPHS PCT: 16 %
Lymphs Abs: 1.5 10*3/uL (ref 0.7–4.0)
MCH: 31.3 pg (ref 26.0–34.0)
MCHC: 33.4 g/dL (ref 30.0–36.0)
MCV: 93.6 fL (ref 78.0–100.0)
MONO ABS: 0.8 10*3/uL (ref 0.1–1.0)
Monocytes Relative: 8 %
NEUTROS ABS: 7.4 10*3/uL (ref 1.7–7.7)
NEUTROS PCT: 76 %
Platelets: 106 10*3/uL — ABNORMAL LOW (ref 150–400)
RBC: 3.58 MIL/uL — AB (ref 4.22–5.81)
RDW: 15.4 % (ref 11.5–15.5)
WBC: 9.8 10*3/uL (ref 4.0–10.5)

## 2017-10-13 MED ORDER — OXYCODONE-ACETAMINOPHEN 5-325 MG PO TABS
2.0000 | ORAL_TABLET | Freq: Once | ORAL | Status: DC
  Filled 2017-10-13: qty 2

## 2017-10-13 MED ORDER — LORATADINE 10 MG PO TABS
10.0000 mg | ORAL_TABLET | Freq: Every day | ORAL | Status: DC
  Administered 2017-10-14 – 2017-10-16 (×3): 10 mg via ORAL
  Filled 2017-10-13 (×3): qty 1

## 2017-10-13 MED ORDER — CARVEDILOL 12.5 MG PO TABS: 25.0000 mg | ORAL_TABLET | Freq: Every day | ORAL | Status: DC

## 2017-10-13 MED ORDER — ACETAMINOPHEN 325 MG PO TABS
650.0000 mg | ORAL_TABLET | Freq: Once | ORAL | Status: AC
  Administered 2017-10-13: 650 mg via ORAL
  Filled 2017-10-13: qty 2

## 2017-10-13 MED ORDER — ADULT MULTIVITAMIN W/MINERALS CH
1.0000 | ORAL_TABLET | Freq: Every day | ORAL | Status: DC
  Filled 2017-10-13 (×3): qty 1

## 2017-10-13 MED ORDER — HALOPERIDOL 0.5 MG PO TABS
7.5000 mg | ORAL_TABLET | Freq: Every day | ORAL | Status: DC
  Administered 2017-10-13 – 2017-10-15 (×3): 7.5 mg via ORAL
  Filled 2017-10-13 (×3): qty 1

## 2017-10-13 MED ORDER — CLONIDINE HCL 0.2 MG PO TABS
0.3000 mg | ORAL_TABLET | Freq: Once | ORAL | Status: AC
  Administered 2017-10-13: 22:00:00 0.3 mg via ORAL
  Filled 2017-10-13: qty 1

## 2017-10-13 MED ORDER — LOSARTAN POTASSIUM 50 MG PO TABS
100.0000 mg | ORAL_TABLET | Freq: Every day | ORAL | Status: DC
  Administered 2017-10-14 – 2017-10-16 (×3): 100 mg via ORAL
  Filled 2017-10-13 (×3): qty 2

## 2017-10-13 MED ORDER — NIACIN ER (ANTIHYPERLIPIDEMIC) 500 MG PO TBCR
1000.0000 mg | EXTENDED_RELEASE_TABLET | Freq: Every day | ORAL | Status: DC
  Administered 2017-10-14 – 2017-10-15 (×3): 1000 mg via ORAL
  Filled 2017-10-13 (×2): qty 2
  Filled 2017-10-13: qty 4
  Filled 2017-10-13 (×4): qty 2

## 2017-10-13 MED ORDER — VITAMIN D (ERGOCALCIFEROL) 1.25 MG (50000 UNIT) PO CAPS: 50000.0000 [IU] | ORAL_CAPSULE | ORAL | Status: DC

## 2017-10-13 MED ORDER — DEXTROSE 5 % IV SOLN
1.0000 g | Freq: Once | INTRAVENOUS | Status: AC
  Administered 2017-10-13: 1 g via INTRAVENOUS
  Filled 2017-10-13: qty 10

## 2017-10-13 MED ORDER — LACTULOSE 10 GM/15ML PO SOLN
10.0000 g | Freq: Every day | ORAL | Status: DC
  Administered 2017-10-14 – 2017-10-16 (×3): 10 g via ORAL
  Filled 2017-10-13 (×3): qty 30

## 2017-10-13 MED ORDER — LIDOCAINE-PRILOCAINE 2.5-2.5 % EX CREA: 1.0000 "application " | TOPICAL_CREAM | CUTANEOUS | Status: DC

## 2017-10-13 MED ORDER — BENZTROPINE MESYLATE 1 MG PO TABS
0.5000 mg | ORAL_TABLET | Freq: Two times a day (BID) | ORAL | Status: DC
  Administered 2017-10-13 – 2017-10-16 (×6): 0.5 mg via ORAL
  Filled 2017-10-13 (×6): qty 1

## 2017-10-13 MED ORDER — GLIPIZIDE ER 5 MG PO TB24: 5.0000 mg | ORAL_TABLET | Freq: Every day | ORAL | Status: DC

## 2017-10-13 MED ORDER — COENZYME Q10 100 MG PO TABS: 100.0000 mg | ORAL_TABLET | Freq: Every day | ORAL | Status: DC

## 2017-10-13 MED ORDER — ISOSORBIDE MONONITRATE ER 60 MG PO TB24
90.0000 mg | ORAL_TABLET | Freq: Every day | ORAL | Status: DC
  Administered 2017-10-14 – 2017-10-16 (×3): 90 mg via ORAL
  Filled 2017-10-13 (×3): qty 2

## 2017-10-13 MED ORDER — ENOXAPARIN SODIUM 30 MG/0.3ML ~~LOC~~ SOLN
30.0000 mg | SUBCUTANEOUS | Status: DC
  Administered 2017-10-13 – 2017-10-15 (×3): 30 mg via SUBCUTANEOUS
  Filled 2017-10-13 (×3): qty 0.3

## 2017-10-13 MED ORDER — CLONIDINE HCL 0.2 MG PO TABS
0.3000 mg | ORAL_TABLET | Freq: Two times a day (BID) | ORAL | Status: DC
  Administered 2017-10-14 – 2017-10-16 (×5): 0.3 mg via ORAL
  Filled 2017-10-13 (×5): qty 1

## 2017-10-13 MED ORDER — ASPIRIN EC 81 MG PO TBEC
81.0000 mg | DELAYED_RELEASE_TABLET | Freq: Every day | ORAL | Status: DC
  Administered 2017-10-14 – 2017-10-16 (×3): 81 mg via ORAL
  Filled 2017-10-13 (×3): qty 1

## 2017-10-13 MED ORDER — PRAVASTATIN SODIUM 40 MG PO TABS
40.0000 mg | ORAL_TABLET | Freq: Every day | ORAL | Status: DC
  Administered 2017-10-13 – 2017-10-15 (×3): 40 mg via ORAL
  Filled 2017-10-13 (×3): qty 1

## 2017-10-13 MED ORDER — POTASSIUM CHLORIDE CRYS ER 20 MEQ PO TBCR
10.0000 meq | EXTENDED_RELEASE_TABLET | Freq: Once | ORAL | Status: AC
  Administered 2017-10-13: 10 meq via ORAL
  Filled 2017-10-13: qty 1

## 2017-10-13 MED ORDER — VITAMIN D (ERGOCALCIFEROL) 1.25 MG (50000 UNIT) PO CAPS
50000.0000 [IU] | ORAL_CAPSULE | ORAL | Status: DC
  Administered 2017-10-16: 50000 [IU] via ORAL
  Filled 2017-10-13: qty 1

## 2017-10-13 MED ORDER — DOCUSATE SODIUM 100 MG PO CAPS
100.0000 mg | ORAL_CAPSULE | Freq: Two times a day (BID) | ORAL | Status: DC
  Administered 2017-10-13 – 2017-10-16 (×6): 100 mg via ORAL
  Filled 2017-10-13 (×6): qty 1

## 2017-10-13 MED ORDER — CARVEDILOL 12.5 MG PO TABS
25.0000 mg | ORAL_TABLET | Freq: Once | ORAL | Status: AC
  Administered 2017-10-13: 25 mg via ORAL
  Filled 2017-10-13: qty 2

## 2017-10-13 NOTE — ED Notes (Signed)
ED Provider at bedside. 

## 2017-10-13 NOTE — H&P (Signed)
History and Physical    Lawrence [name] IWP:[id number] DOB: 1956-11-28 DOA: 10/13/2017  PCP: Lawrence Peach, MD   Patient coming from: Home.  I have personally briefly reviewed patient's old medical records in [place]  Chief Complaint: Right flank pain.  HPI: Lawrence [name] is a 61 y.o. male with medical history significant of anemia, cerebral hemorrhage, chronic constipation, chronic kidney disease, type 2 diabetes, ESRD on hemodialysis, hyperlipidemia, hypertension, schizoaffective schizophrenia who is coming to the emergency department due to progressively worse right flank pain associated with dysuria and hematuria since this morning while having hemodialysis. He denies testicular tenderness or edema, fever, but complains of chills and fatigue.  No chest pain, dyspnea, palpitations, dizziness, abdominal pain, nausea, emesis, diarrhea, melena, hematochezia.  He states that his blood glucose has been under control, denies blurred vision.  ED Course: Initial vital signs show a temperature 98.58F, pulse of 86, respirations 18, blood pressure 196/100 mmHg and O2 sat 99%.  He received KCl 10 mEq p.o. x1 and ceftriaxone 1 g IVPB  Workup shows urine analysis with pyuria, bacteriuria, positive nitrites, mild proteinuria and trace ketonuria. An urine sample was sent to the lab for culture and sensitivity.  His CBC shows WBC of 9.9 with 76% neutrophils, hemoglobin 11.2 g/dL and platelets 106.  Sodium is 134, potassium 2.9, chloride 89 and bicarbonate 33 mmol/L.   Imaging: CT renal protocol shows right periureteral inflammatory changes without evidence of obstructive uropathy. This may represent inflammatory changes post passage of ureteral stone, ascending infectious changes or transitional cell malignancy. Multicystic appearance of the kidneys with suboptimal evaluation of the bilateral renal hypoattenuated lesions due to lack of IV contrast. Prostate gland enlargement. Please correlate to  patient's serum PSA values.  Please see images and full radiology report for further detail.  Review of Systems: As per HPI otherwise 10 point review of systems negative.    Past Medical History:  Diagnosis Date  . Anemia   . Cerebral hemorrhage ([place]) 2012  . Chronic constipation   . Chronic kidney disease   . Diabetes mellitus without complication ([place])   . Hemodialysis patient ([place])   . Hyperlipidemia   . Hypertension   . Schizophrenia ([place])   . Stroke [facility])     Past Surgical History:  Procedure Laterality Date  . VASCULAR SURGERY     Fistula placement     reports that  has never smoked. he has never used smokeless tobacco. He reports that he does not drink alcohol or use drugs.  Allergies  Allergen Reactions  . Nsaids Other (See Comments)    Cannot take due to renal disease    Family History  Problem Relation Age of Onset  . Diabetes Mother   . Kidney cancer Mother   . Diabetes Sister   . Stroke Brother   . Prostate cancer Neg Hx   . Bladder Cancer Neg Hx     Prior to Admission medications   Medication Sig Start Date End Date Taking? Authorizing Provider  acetaminophen-codeine (TYLENOL #3) 300-30 MG tablet Take 1-2 tablets by mouth every 4 (four) hours as needed for moderate pain.   Yes [provider]  aspirin EC 81 MG tablet Take 81 mg by mouth daily.   Yes [provider]  benztropine (COGENTIN) 0.5 MG tablet Take 0.5 mg by mouth 2 (two) times daily.   Yes [provider]  carvedilol (COREG) 25 MG tablet Take 25 mg by mouth at bedtime.    Yes [provider]  cetirizine (ZYRTEC) 10 MG tablet Take 10 mg by mouth at bedtime.    Yes [provider]  cloNIDine (CATAPRES) 0.3 MG tablet Take 0.3 mg by mouth 2 (two) times daily.   Yes [provider]  Coenzyme Q10 100 MG TABS Take 100 mg by mouth at bedtime.   Yes [provider]  docusate sodium (COLACE) 100 MG capsule Take 100 mg by mouth 2 (two) times  daily.   Yes [provider]  glipiZIDE (GLUCOTROL XL) 5 MG 24 hr tablet Take 5 mg by mouth daily with breakfast.   Yes [provider]  haloperidol (HALDOL) 5 MG tablet Take 7.5 mg by mouth at bedtime. At bedtime    Yes [provider]  isosorbide mononitrate (IMDUR) 60 MG 24 hr tablet Take 90 mg by mouth daily.   Yes [provider]  lactulose (CHRONULAC) 10 GM/15ML solution Take 10 g by mouth daily.   Yes [provider]  lidocaine-prilocaine (EMLA) cream Apply 1 application topically 3 (three) times a week. Apply over dialysis shunt 30 minutes prior to dialysis.   Yes [provider]  losartan (COZAAR) 100 MG tablet Take 1 tablet (100 mg total) by mouth daily. 04/23/15  Yes Wieting, Richard, MD  Multiple Vitamins-Minerals (MULTIVITAMIN WITH MINERALS) tablet Take 1 tablet by mouth daily.   Yes [provider]  niacin (NIASPAN) 1000 MG CR tablet Take 1,000 mg by mouth at bedtime.   Yes [provider]  pravastatin (PRAVACHOL) 40 MG tablet Take 40 mg by mouth at bedtime.   Yes [provider]  Vitamin D, Ergocalciferol, (DRISDOL) 50000 UNITS CAPS capsule Take 50,000 Units by mouth every 7 (seven) days.   Yes [provider]    Physical Exam: Vitals:   10/13/17 2100 10/13/17 2115 10/13/17 2230 10/13/17 2256  BP: (!) 197/102  (!) 195/98 (!) 191/95  Pulse: 88 88  94  Resp:    16  Temp:    98.7 F (37.1 C)  TempSrc:    Oral  SpO2: 97% 100%  100%  Weight:    70.9 kg (156 lb 4.9 oz)  Height:        Constitutional: NAD, calm, comfortable Eyes: PERRL, lids and conjunctivae normal ENMT: Mucous membranes are moist. Posterior pharynx clear of any exudate or lesions. Neck: normal, supple, no masses, no thyromegaly Respiratory: clear to auscultation bilaterally, no wheezing, no crackles. Normal respiratory effort. No accessory muscle use.  Cardiovascular: Regular rate and rhythm, no murmurs / rubs / gallops.  No extremity edema.  Left brachiocephalic AV fistula.  2+ pedal pulses. No carotid bruits.  Abdomen: Soft, mild suprapubic tenderness, no masses palpated. No hepatosplenomegaly. Bowel sounds positive.  Positive right CVA tenderness on palpation. Musculoskeletal: no clubbing / cyanosis.Good ROM, no contractures. Normal muscle tone.  Skin: no significant rashes, lesions, ulcers on limited skin exam. Neurologic: CN 2-12 grossly intact. Sensation intact, DTR normal. Strength 5/5 in all 4.  Psychiatric:  Alert and oriented x 3. Normal mood.    Labs on Admission: I have personally reviewed following labs and imaging studies  CBC: Recent Labs  Lab 10/13/17 1738  WBC 9.8  NEUTROABS 7.4  HGB 11.2*  HCT 33.5*  MCV 93.6  PLT 619*   Basic Metabolic Panel: Recent Labs  Lab 10/13/17 1738  NA 134*  K 2.9*  CL 89*  CO2 33*  GLUCOSE 97  BUN 22*  CREATININE 5.39*  CALCIUM 9.1   GFR: Estimated Creatinine  Clearance: 14.4 mL/min (A) (by C-G formula based on SCr of 5.39 mg/dL (H)). Liver Function Tests: No results for input(s): AST, ALT, ALKPHOS, BILITOT, PROT, ALBUMIN in the last 168 hours. No results for input(s): LIPASE, AMYLASE in the last 168 hours. No results for input(s): AMMONIA in the last 168 hours. Coagulation Profile: No results for input(s): INR, PROTIME in the last 168 hours. Cardiac Enzymes: No results for input(s): CKTOTAL, CKMB, CKMBINDEX, TROPONINI in the last 168 hours. BNP (last 3 results) No results for input(s): PROBNP in the last 8760 hours. HbA1C: No results for input(s): HGBA1C in the last 72 hours. CBG: No results for input(s): GLUCAP in the last 168 hours. Lipid Profile: No results for input(s): CHOL, HDL, LDLCALC, TRIG, CHOLHDL, LDLDIRECT in the last 72 hours. Thyroid Function Tests: No results for input(s): TSH, T4TOTAL, FREET4, T3FREE, THYROIDAB in the last 72 hours. Anemia Panel: No results for input(s): VITAMINB12, FOLATE, FERRITIN, TIBC, IRON,  RETICCTPCT in the last 72 hours. Urine analysis:    Component Value Date/Time   COLORURINE BROWN (A) 10/13/2017 1703   APPEARANCEUR HAZY (A) 10/13/2017 1703   APPEARANCEUR Clear 12/13/2014 1354   LABSPEC 1.020 10/13/2017 1703   LABSPEC 1.006 12/13/2014 1354   PHURINE 8.5 (H) 10/13/2017 1703   GLUCOSEU 250 (A) 10/13/2017 1703   GLUCOSEU 50 mg/dL 12/13/2014 1354   HGBUR LARGE (A) 10/13/2017 1703   BILIRUBINUR MODERATE (A) 10/13/2017 1703   BILIRUBINUR Negative 12/13/2014 1354   KETONESUR TRACE (A) 10/13/2017 1703   PROTEINUR >300 (A) 10/13/2017 1703   NITRITE POSITIVE (A) 10/13/2017 1703   LEUKOCYTESUR LARGE (A) 10/13/2017 1703   LEUKOCYTESUR Negative 12/13/2014 1354    Radiological Exams on Admission: Ct Renal Stone Study  Result Date: 10/13/2017 CLINICAL DATA:  Right flank pain which is worsening. EXAM: CT ABDOMEN AND PELVIS WITHOUT CONTRAST TECHNIQUE: Multidetector CT imaging of the abdomen and pelvis was performed following the standard protocol without IV contrast. COMPARISON:  None. FINDINGS: Lower chest: No acute abnormality. Hepatobiliary: 6.3 cm benign-appearing cyst in the dome of the liver. Normal appearance of the gallbladder. Pancreas: Unremarkable. No pancreatic ductal dilatation or surrounding inflammatory changes. Spleen: Normal in size without focal abnormality. Adrenals/Urinary Tract: Bilateral cortical thinning with multicystic appearance of the kidneys. The hypoattenuated bilateral renal lesions are incompletely evaluated due to lack of IV contrast. Right periureteral inflammatory changes at the level of the mid right ureter without significant dilation, image [id number], sequence 2, axial images. No radiopaque obstructive calculus is seen. The urinary bladder is minimally distended but normal for its state of distension. Stomach/Bowel: Stomach is within normal limits. No evidence of appendicitis. No evidence of bowel wall thickening, distention, or inflammatory changes.  Vascular/Lymphatic: No significant vascular findings are present. No enlarged abdominal or pelvic lymph nodes. Reproductive: Enlarged prostate gland measuring 5.3 cm. Other: No abdominal wall hernia or abnormality. No abdominopelvic ascites. 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 IMPRESSION: Right periureteral inflammatory changes without evidence of obstructive uropathy. This may represent inflammatory changes post passage of ureteral stone, ascending infectious changes or transitional cell malignancy. Multicystic appearance of the kidneys with suboptimal evaluation of the bilateral renal hypoattenuated lesions due to lack of IV contrast. Prostate gland enlargement. Please correlate to patient's serum PSA values. Electronically Signed   By: Fidela Salisbury M.D.   On: 10/13/2017 18:11    EKG: Independently reviewed.   Assessment/Plan Principal Problem:   Acute urinary tract infection Admit to telemetry/inpatient. Continue ceftriaxone 1 g every 24  hours. Continue analgesics as needed. Follow-up urinary culture and sensitivity.  Active Problems:   ESRD on dialysis [facility]) On Tuesday, Thursday, Saturday schedule. Consult nephrology for dialysis orders.    HTN (hypertension) Continue carvedilol 25 mg p.o. at bedtime. Continue Catapres 0.3 mg p.o. twice daily. Continue losartan 100 mg p.o. daily. Monitor blood pressure, renal function and electrolytes.    Chronic schizoaffective schizophrenia (HCC) Continue Cogentin 0.5 mg p.o. twice daily. Continue Haldol 7.5 mg p.o. at bedtime.    Type 2 diabetes mellitus (HCC) Carbohydrate modified diet. Continue glipizide XL 5 mg p.o. daily.    HLD (hyperlipidemia) Continue pravastatin 40 mg at bedtime. Continue niacin CR 1000 mg p.o. at bedtime.    History of CVA (cerebrovascular accident) Continue daily aspirin.     Hypokalemia He was given 10 mEq of potassium orally in the  ED. Follow-up potassium level.    Anemia in ESRD (end-stage renal disease) (HCC) Monitor hematocrit and hemoglobin. Erythropoietin administration per nephrology.      Thrombocytopenia (HCC) Monitor platelet level.   DVT prophylaxis: Heparin SQ. Code Status: Full code. Family Communication:  Disposition Plan: Admit for IV antibiotic therapy for 2-3 days. Consults called:  Admission status: Inpatient/telemetry.   Reubin Milan MD [facility] Pager [phone].  If 7PM-7AM, please contact night-coverage [email or web address] Password [facility]  10/13/2017, 11:18 PM   This document was prepared using Dragon voice recognition software and may contain some unintended errors.

## 2017-10-13 NOTE — Progress Notes (Signed)
PHARMACIST - PHYSICIAN ORDER COMMUNICATION  CONCERNING: P&T Medication Policy on Herbal Medications  DESCRIPTION:  This patient's order for:  Co-enzyme Q10 has been noted.  This product(s) is classified as an "herbal" or natural product. Due to a lack of definitive safety studies or FDA approval, nonstandard manufacturing practices, plus the potential risk of unknown drug-drug interactions while on inpatient medications, the Pharmacy and Therapeutics Committee does not permit the use of "herbal" or natural products of this type within .   ACTION TAKEN: The pharmacy department is unable to verify this order at this time and your patient has been informed of this safety policy. Please reevaluate patient's clinical condition at discharge and address if the herbal or natural product(s) should be resumed at that time.   

## 2017-10-13 NOTE — ED Notes (Signed)
Checked with pt for urine sample,pt can't go right now,will try back in 30 minutes. 

## 2017-10-13 NOTE — ED Notes (Signed)
Pt comes from group home, [facility].   Contact is [phone]  [place] [phone]

## 2017-10-13 NOTE — ED Triage Notes (Signed)
Since waking up pt has has right flank pain worsening throughout the day. Pt is a dialysis patient and completed his full session today. He makes small amounts of urine, today this has been dark and bloody in color. Pt was given tylenol a dialysis. BP elevated as well. 200/100. Pt is alert and oriented.

## 2017-10-13 NOTE — ED Provider Notes (Signed)
[facility] EMERGENCY DEPARTMENT Provider Note   CSN: [id number] Arrival date & time: 10/13/17  [place]     History   Chief Complaint Chief Complaint  Patient presents with  . Flank Pain    HPI Lawrence [name] is a 61 y.o. male.  HPI Pt was seen at 1655. Per pt, c/o sudden onset and persistence of constant right sided flank and low back "pain" that began this morning. Has been associated with hematuria and dysuria. Denies testicular pain/swelling, no abd pain, no N/V, no diarrhea, no CP/SOB, no fevers, no rash.     Past Medical History:  Diagnosis Date  . Anemia   . Cerebral hemorrhage ([place]) 2012  . Chronic constipation   . Chronic kidney disease   . Diabetes mellitus without complication ([place])   . Hemodialysis patient ([place])   . Hyperlipidemia   . Hypertension   . Schizophrenia ([place])   . Stroke [facility])     Patient Active Problem List   Diagnosis Date Noted  . Chronic schizoaffective schizophrenia ([place]) 04/09/2016  . Complication of diabetes mellitus ([place]) 03/09/2016  . Chronic kidney disease, stage V ([place]) 03/09/2016  . Thyroid nodule 03/09/2016  . End stage renal failure on dialysis ([place]) 05/02/2015  . ESRD on dialysis ([place]) 04/16/2015  . ESRD (end stage renal disease) ([place]) 04/16/2015  . HTN (hypertension) 04/16/2015  . Big thyroid 02/27/2015  . Dry mouth 02/27/2015  . Focal and segmental hyalinosis 02/26/2015  . Chronic kidney disease, stage IV (severe) ([place]) 01/21/2015  . Controlled type 2 diabetes mellitus without complication ([place]) 72/53/6644  . Essential (primary) hypertension 01/21/2015  . Encounter for screening for malignant neoplasm of colon 08/02/2014  . Absolute anemia 05/12/2013  . Avitaminosis D 05/12/2013  . Cerebrovascular accident (CVA) ([place]) 04/19/2013  . Type 2 diabetes mellitus ([place]) 04/19/2013  . HLD (hyperlipidemia) 04/19/2013  . BP (high blood pressure) 04/19/2013    Past Surgical History:  Procedure Laterality Date  . VASCULAR  SURGERY     Fistula placement       Home Medications    Prior to Admission medications   Medication Sig Start Date End Date Taking? Authorizing Provider  acetaminophen-codeine (TYLENOL #3) 300-30 MG tablet Take 1-2 tablets by mouth every 4 (four) hours as needed for moderate pain.    [provider]  amLODipine (NORVASC) 10 MG tablet Take by mouth. 03/27/17 02/19/18  [provider]  aspirin EC 81 MG tablet Take 81 mg by mouth daily.    [provider]  benztropine (COGENTIN) 0.5 MG tablet Take 0.5 mg by mouth 2 (two) times daily.    [provider]  carvedilol (COREG) 25 MG tablet Take 25 mg by mouth at bedtime.     [provider]  cetirizine (ZYRTEC) 10 MG tablet Take 10 mg by mouth at bedtime.     [provider]  cloNIDine (CATAPRES) 0.3 MG tablet Take 0.3 mg by mouth 2 (two) times daily.    [provider]  Coenzyme Q10 100 MG TABS Take 100 mg by mouth at bedtime.    [provider]  docusate sodium (COLACE) 100 MG capsule Take 100 mg by mouth 2 (two) times daily.    [provider]  glipiZIDE (GLUCOTROL XL) 5 MG 24 hr tablet Take 5 mg by mouth daily with breakfast.    [provider]  haloperidol (HALDOL) 5 MG tablet Take 7.5 mg by mouth at bedtime. At bedtime     [provider]  isosorbide mononitrate (IMDUR) 60 MG 24 hr tablet Take 90 mg by mouth daily.    [provider]  lactulose (CHRONULAC) 10 GM/15ML solution Take 10 g by mouth daily.    [provider]  lidocaine-prilocaine (EMLA) cream Apply 1 application topically 3 (three) times a week. Apply over dialysis shunt 30 minutes prior to dialysis.    [provider]  losartan (COZAAR) 100 MG tablet Take 1 tablet (100 mg total) by mouth daily. 04/23/15   Loletha Grayer, MD  Multiple Vitamins-Minerals (MULTIVITAMIN WITH MINERALS) tablet Take 1 tablet by mouth daily.    [provider]  niacin  (NIASPAN) 1000 MG CR tablet Take 1,000 mg by mouth at bedtime.    [provider]  pravastatin (PRAVACHOL) 40 MG tablet Take 40 mg by mouth at bedtime.    [provider]  Vitamin D, Ergocalciferol, (DRISDOL) 50000 UNITS CAPS capsule Take 50,000 Units by mouth every 7 (seven) days.    [provider]    Family History Family History  Problem Relation Age of Onset  . Diabetes Mother   . Kidney cancer Mother   . Diabetes Sister   . Stroke Brother   . Prostate cancer Neg Hx   . Bladder Cancer Neg Hx     Social History Social History   Tobacco Use  . Smoking status: Never Smoker  . Smokeless tobacco: Never Used  Substance Use Topics  . Alcohol use: No  . Drug use: No     Allergies   Nsaids   Review of Systems Review of Systems ROS: Statement: All systems negative except as marked or noted in the HPI; Constitutional: Negative for fever and chills. ; ; Eyes: Negative for eye pain, redness and discharge. ; ; ENMT: Negative for ear pain, hoarseness, nasal congestion, sinus pressure and sore throat. ; ; Cardiovascular: Negative for chest pain, palpitations, diaphoresis, dyspnea and peripheral edema. ; ; Respiratory: Negative for cough, wheezing and stridor. ; ; Gastrointestinal: Negative for nausea, vomiting, diarrhea, abdominal pain, blood in stool, hematemesis, jaundice and rectal bleeding. . ; ; Genitourinary: +flank pain, dysuria, and hematuria. ; ; Genital:  No penile drainage or rash, no testicular pain or swelling, no scrotal rash or swelling. ;; Musculoskeletal: +LBP. Negative for neck pain. Negative for swelling and trauma.; ; Skin: Negative for pruritus, rash, abrasions, blisters, bruising and skin lesion.; ; Neuro: Negative for headache, lightheadedness and neck stiffness. Negative for weakness, altered level of consciousness, altered mental status, extremity weakness, paresthesias, involuntary movement, seizure and syncope.       Physical  Exam Updated Vital Signs BP (!) 196/100   Pulse 86   Temp 98.2 F (36.8 C) (Oral)   Resp 18   Ht 5\' 9"  (1.753 m)   Wt 72.6 kg (160 lb)   SpO2 99%   BMI 23.63 kg/m   Physical Exam 1700: Physical examination:  Nursing notes reviewed; Vital signs and O2 SAT reviewed;  Constitutional: Well developed, Well nourished, Well hydrated, In no acute distress; Head:  Normocephalic, atraumatic; Eyes: EOMI, PERRL, No scleral icterus; ENMT: Mouth and pharynx normal, Mucous membranes moist; Neck: Supple, Full range of motion, No lymphadenopathy; Cardiovascular: Regular rate and rhythm, No gallop; Respiratory: Breath sounds clear & equal bilaterally, No wheezes.  Speaking full sentences with ease, Normal respiratory effort/excursion; Chest: Nontender, Movement normal; Abdomen: Soft, Nontender, Nondistended, Normal bowel sounds; Genitourinary: +right CVA tenderness; Spine:  No midline CS, TS, LS tenderness. +TTP right lumbar paraspinal muscles.;;   Extremities: Pulses  normal, No tenderness, No edema, No calf edema or asymmetry.; Neuro: AA&Ox3, Major CN grossly intact.  Speech clear. No gross focal motor or sensory deficits in extremities.; Skin: Color normal, Warm, Dry.   ED Treatments / Results  Labs (all labs ordered are listed, but only abnormal results are displayed)   EKG  EKG Interpretation None       Radiology   Procedures Procedures (including critical care time)  Medications Ordered in ED Medications  acetaminophen (TYLENOL) tablet 650 mg (650 mg Oral Given 10/13/17 1710)     Initial Impression / Assessment and Plan / ED Course  I have reviewed the triage vital signs and the nursing notes.  Pertinent labs & imaging results that were available during my care of the patient were reviewed by me and considered in my medical decision making (see chart for details).  MDM Reviewed: previous chart, nursing note and vitals Reviewed previous: labs Interpretation: labs and CT  scan   Results for orders placed or performed during the hospital encounter of 67/89/38  Basic metabolic panel  Result Value Ref Range   Sodium 134 (L) 135 - 145 mmol/L   Potassium 2.9 (L) 3.5 - 5.1 mmol/L   Chloride 89 (L) 101 - 111 mmol/L   CO2 33 (H) 22 - 32 mmol/L   Glucose, Bld 97 65 - 99 mg/dL   BUN 22 (H) 6 - 20 mg/dL   Creatinine, Ser 5.39 (H) 0.61 - 1.24 mg/dL   Calcium 9.1 8.9 - 10.3 mg/dL   GFR calc non Af Amer 10 (L) >60 mL/min   GFR calc Af Amer 12 (L) >60 mL/min   Anion gap 12 5 - 15  CBC with Differential  Result Value Ref Range   WBC 9.8 4.0 - 10.5 K/uL   RBC 3.58 (L) 4.22 - 5.81 MIL/uL   Hemoglobin 11.2 (L) 13.0 - 17.0 g/dL   HCT 33.5 (L) 39.0 - 52.0 %   MCV 93.6 78.0 - 100.0 fL   MCH 31.3 26.0 - 34.0 pg   MCHC 33.4 30.0 - 36.0 g/dL   RDW 15.4 11.5 - 15.5 %   Platelets 106 (L) 150 - 400 K/uL   Neutrophils Relative % 76 %   Neutro Abs 7.4 1.7 - 7.7 K/uL   Lymphocytes Relative 16 %   Lymphs Abs 1.5 0.7 - 4.0 K/uL   Monocytes Relative 8 %   Monocytes Absolute 0.8 0.1 - 1.0 K/uL   Eosinophils Relative 1 %   Eosinophils Absolute 0.1 0.0 - 0.7 K/uL   Basophils Relative 0 %   Basophils Absolute 0.0 0.0 - 0.1 K/uL  Urinalysis, Routine w reflex microscopic  Result Value Ref Range   Color, Urine BROWN (A) YELLOW   APPearance HAZY (A) CLEAR   Specific Gravity, Urine 1.020 1.005 - 1.030   pH 8.5 (H) 5.0 - 8.0   Glucose, UA 250 (A) NEGATIVE mg/dL   Hgb urine dipstick LARGE (A) NEGATIVE   Bilirubin Urine MODERATE (A) NEGATIVE   Ketones, ur TRACE (A) NEGATIVE mg/dL   Protein, ur >300 (A) NEGATIVE mg/dL   Nitrite POSITIVE (A) NEGATIVE   Leukocytes, UA LARGE (A) NEGATIVE  Urinalysis, Microscopic (reflex)  Result Value Ref Range   RBC / HPF TOO NUMEROUS TO COUNT 0 - 5 RBC/hpf   WBC, UA 6-30 0 - 5 WBC/hpf   Bacteria, UA MANY (A) NONE SEEN   Squamous Epithelial / LPF 0-5 (A) NONE SEEN   Ct Renal Stone Study Result Date:  10/13/2017 CLINICAL DATA:  Right flank  pain which is worsening. EXAM: CT ABDOMEN AND PELVIS WITHOUT CONTRAST TECHNIQUE: Multidetector CT imaging of the abdomen and pelvis was performed following the standard protocol without IV contrast. COMPARISON:  None. FINDINGS: Lower chest: No acute abnormality. Hepatobiliary: 6.3 cm benign-appearing cyst in the dome of the liver. Normal appearance of the gallbladder. Pancreas: Unremarkable. No pancreatic ductal dilatation or surrounding inflammatory changes. Spleen: Normal in size without focal abnormality. Adrenals/Urinary Tract: Bilateral cortical thinning with multicystic appearance of the kidneys. The hypoattenuated bilateral renal lesions are incompletely evaluated due to lack of IV contrast. Right periureteral inflammatory changes at the level of the mid right ureter without significant dilation, image 31-46/88, sequence 2, axial images. No radiopaque obstructive calculus is seen. The urinary bladder is minimally distended but normal for its state of distension. Stomach/Bowel: Stomach is within normal limits. No evidence of appendicitis. No evidence of bowel wall thickening, distention, or inflammatory changes. Vascular/Lymphatic: No significant vascular findings are present. No enlarged abdominal or pelvic lymph nodes. Reproductive: Enlarged prostate gland measuring 5.3 cm. Other: No abdominal wall hernia or abnormality. No abdominopelvic ascites. 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 IMPRESSION: Right periureteral inflammatory changes without evidence of obstructive uropathy. This may represent inflammatory changes post passage of ureteral stone, ascending infectious changes or transitional cell malignancy. Multicystic appearance of the kidneys with suboptimal evaluation of the bilateral renal hypoattenuated lesions due to lack of IV contrast. Prostate gland enlargement. Please correlate to patient's serum PSA values. Electronically Signed    By: Fidela Salisbury M.D.   On: 10/13/2017 18:11     2120:  Will dose pt's usual HTN meds.  ESRD on HD. Small dose of potassium given for hypokalemia.  +UTI, UC pending. IV rocephin given. T/C to [facility] Dr. Olevia Bowens, case discussed, including:  HPI, pertinent PM/SHx, VS/PE, dx testing, ED course and treatment:  Agreeable to admit.     Final Clinical Impressions(s) / ED Diagnoses   Final diagnoses:  None    ED Discharge Orders    None       Francine Graven, DO 10/16/17 2323

## 2017-10-13 NOTE — ED Notes (Signed)
Checked on patient for

## 2017-10-13 NOTE — ED Notes (Signed)
Spoke with pt's sister Oland Arquette and informed her of pt's impending admission as well as room assigned.

## 2017-10-14 ENCOUNTER — Other Ambulatory Visit: Payer: Self-pay

## 2017-10-14 ENCOUNTER — Inpatient Hospital Stay (HOSPITAL_COMMUNITY): Payer: Medicare Other

## 2017-10-14 DIAGNOSIS — N390 Urinary tract infection, site not specified: Principal | ICD-10-CM

## 2017-10-14 DIAGNOSIS — Z8673 Personal history of transient ischemic attack (TIA), and cerebral infarction without residual deficits: Secondary | ICD-10-CM

## 2017-10-14 DIAGNOSIS — Z992 Dependence on renal dialysis: Secondary | ICD-10-CM | POA: Diagnosis present

## 2017-10-14 DIAGNOSIS — N186 End stage renal disease: Secondary | ICD-10-CM

## 2017-10-14 DIAGNOSIS — E785 Hyperlipidemia, unspecified: Secondary | ICD-10-CM

## 2017-10-14 DIAGNOSIS — F258 Other schizoaffective disorders: Secondary | ICD-10-CM

## 2017-10-14 DIAGNOSIS — E876 Hypokalemia: Secondary | ICD-10-CM | POA: Diagnosis present

## 2017-10-14 DIAGNOSIS — D696 Thrombocytopenia, unspecified: Secondary | ICD-10-CM

## 2017-10-14 DIAGNOSIS — D631 Anemia in chronic kidney disease: Secondary | ICD-10-CM

## 2017-10-14 LAB — CBC WITH DIFFERENTIAL/PLATELET
Basophils Absolute: 0 10*3/uL (ref 0.0–0.1)
Basophils Relative: 0 %
EOS ABS: 0 10*3/uL (ref 0.0–0.7)
EOS PCT: 0 %
HCT: 34.9 % — ABNORMAL LOW (ref 39.0–52.0)
Hemoglobin: 11.4 g/dL — ABNORMAL LOW (ref 13.0–17.0)
LYMPHS ABS: 1 10*3/uL (ref 0.7–4.0)
Lymphocytes Relative: 8 %
MCH: 30.9 pg (ref 26.0–34.0)
MCHC: 32.7 g/dL (ref 30.0–36.0)
MCV: 94.6 fL (ref 78.0–100.0)
Monocytes Absolute: 0.7 10*3/uL (ref 0.1–1.0)
Monocytes Relative: 6 %
Neutro Abs: 10.9 10*3/uL — ABNORMAL HIGH (ref 1.7–7.7)
Neutrophils Relative %: 86 %
PLATELETS: 106 10*3/uL — AB (ref 150–400)
RBC: 3.69 MIL/uL — AB (ref 4.22–5.81)
RDW: 15.9 % — ABNORMAL HIGH (ref 11.5–15.5)
WBC: 12.6 10*3/uL — AB (ref 4.0–10.5)

## 2017-10-14 LAB — COMPREHENSIVE METABOLIC PANEL
ALT: 18 U/L (ref 17–63)
ANION GAP: 11 (ref 5–15)
AST: 16 U/L (ref 15–41)
Albumin: 3.6 g/dL (ref 3.5–5.0)
Alkaline Phosphatase: 76 U/L (ref 38–126)
BUN: 34 mg/dL — ABNORMAL HIGH (ref 6–20)
CHLORIDE: 87 mmol/L — AB (ref 101–111)
CO2: 33 mmol/L — AB (ref 22–32)
CREATININE: 7 mg/dL — AB (ref 0.61–1.24)
Calcium: 9.2 mg/dL (ref 8.9–10.3)
GFR calc non Af Amer: 8 mL/min — ABNORMAL LOW (ref >60)
GFR, EST AFRICAN AMERICAN: 9 mL/min — AB (ref >60)
Glucose, Bld: 93 mg/dL (ref 65–99)
Potassium: 3.7 mmol/L (ref 3.5–5.1)
SODIUM: 131 mmol/L — AB (ref 135–145)
Total Bilirubin: 0.7 mg/dL (ref 0.3–1.2)
Total Protein: 7.1 g/dL (ref 6.5–8.1)

## 2017-10-14 LAB — GLUCOSE, CAPILLARY
GLUCOSE-CAPILLARY: 74 mg/dL (ref 65–99)
GLUCOSE-CAPILLARY: 107 mg/dL — AB (ref 65–99)
GLUCOSE-CAPILLARY: 172 mg/dL — AB (ref 65–99)
Glucose-Capillary: 166 mg/dL — ABNORMAL HIGH (ref 65–99)

## 2017-10-14 LAB — MRSA PCR SCREENING: MRSA by PCR: NEGATIVE

## 2017-10-14 MED ORDER — DEXTROSE 5 % IV SOLN
1.0000 g | INTRAVENOUS | Status: DC
  Administered 2017-10-14 – 2017-10-15 (×2): 1 g via INTRAVENOUS
  Filled 2017-10-14 (×5): qty 10

## 2017-10-14 MED ORDER — ACETAMINOPHEN 325 MG PO TABS
650.0000 mg | ORAL_TABLET | Freq: Four times a day (QID) | ORAL | Status: DC | PRN
  Administered 2017-10-14 – 2017-10-15 (×2): 650 mg via ORAL
  Filled 2017-10-14 (×2): qty 2

## 2017-10-14 MED ORDER — ADULT MULTIVITAMIN W/MINERALS CH
1.0000 | ORAL_TABLET | Freq: Every day | ORAL | Status: DC
  Administered 2017-10-14 – 2017-10-16 (×3): 1 via ORAL
  Filled 2017-10-14 (×3): qty 1

## 2017-10-14 MED ORDER — INSULIN ASPART 100 UNIT/ML ~~LOC~~ SOLN
0.0000 [IU] | Freq: Three times a day (TID) | SUBCUTANEOUS | Status: DC
  Administered 2017-10-15: 1 [IU] via SUBCUTANEOUS
  Administered 2017-10-16: 2 [IU] via SUBCUTANEOUS

## 2017-10-14 MED ORDER — METOPROLOL TARTRATE 5 MG/5ML IV SOLN
5.0000 mg | INTRAVENOUS | Status: DC | PRN
  Administered 2017-10-14: 5 mg via INTRAVENOUS
  Filled 2017-10-14: qty 5

## 2017-10-14 MED ORDER — EPOETIN ALFA 2000 UNIT/ML IJ SOLN
2000.0000 [IU] | INTRAMUSCULAR | Status: DC
  Administered 2017-10-15: 2000 [IU] via SUBCUTANEOUS
  Filled 2017-10-14 (×2): qty 1

## 2017-10-14 MED ORDER — MORPHINE SULFATE (PF) 2 MG/ML IV SOLN
2.0000 mg | INTRAVENOUS | Status: DC | PRN
  Administered 2017-10-14: 2 mg via INTRAVENOUS
  Filled 2017-10-14: qty 1

## 2017-10-14 MED ORDER — LIDOCAINE-PRILOCAINE 2.5-2.5 % EX CREA
1.0000 "application " | TOPICAL_CREAM | CUTANEOUS | Status: DC
  Administered 2017-10-15: 1 via TOPICAL

## 2017-10-14 MED ORDER — CARVEDILOL 12.5 MG PO TABS
25.0000 mg | ORAL_TABLET | Freq: Two times a day (BID) | ORAL | Status: DC
  Administered 2017-10-14 – 2017-10-16 (×5): 25 mg via ORAL
  Filled 2017-10-14 (×5): qty 2

## 2017-10-14 NOTE — Progress Notes (Signed)
PROGRESS NOTE  Lawrence [name]  DUK:[id number]  DOB: 10-20-1956  DOA: 10/13/2017 PCP: Sharyne Peach, MD   Brief Admission Hx: Lawrence [name] is a 61 y.o. male with medical history significant of anemia, cerebral hemorrhage, chronic constipation, chronic kidney disease, type 2 diabetes, ESRD on hemodialysis, hyperlipidemia, hypertension, schizoaffective schizophrenia who is coming to the emergency department due to progressively worse right flank pain associated with dysuria and hematuria since this morning while having hemodialysis.  He was admitted with UTI.     MDM/Assessment & Plan:   1. UTI - admitted for IV antibiotics treatment and to follow urine culture and blood culture.  Clinically patient is reporting some mild improvement, continue current therapy.  2. ESRD on HD - will consult nephrology team. He has HD on Tue, Thu, Sat.  He does report that he had HD yesterday.  3. Essential Hypertension - resume home BP meds and follow.  4. Schizophrenia - resume home psychiatric medications.  5. Type 2 DM - DC glipizide, sensitive sliding scale ordered.  6. Hyperlipidemia - Resume home meds.  7. Cerebrovascular Disease with history of CVA - Continue aspirin for secondary prevention. 8. Hypokalemia - follow and replete as needed. Check magnesium.  9. Anemia of CKD - Following.   10. Thrombocytopenia - stable, following.   DVT prophylaxis: heparin Code Status: Full  Family Communication: none present Disposition Plan: home in 1-2 days   Consultants:  nephrology   Subjective: Pt without compaints.  Pt says that he may be noticing some improvement.   Objective: Vitals:   10/13/17 2115 10/13/17 2230 10/13/17 2256 10/14/17 0717  BP:  (!) 195/98 (!) 191/95 (!) 167/82  Pulse: 88  94 (!) 103  Resp:   16 20  Temp:   98.7 F (37.1 C) 100.1 F (37.8 C)  TempSrc:   Oral Oral  SpO2: 100%  100% 100%  Weight:   70.9 kg (156 lb 4.9 oz)   Height:        Intake/Output Summary (Last  24 hours) at 10/14/2017 3762 Last data filed at 10/13/2017 2235 Gross per 24 hour  Intake 50 ml  Output -  Net 50 ml   Filed Weights   10/13/17 1643 10/13/17 2256  Weight: 72.6 kg (160 lb) 70.9 kg (156 lb 4.9 oz)     REVIEW OF SYSTEMS  As per history otherwise all reviewed and reported negative  Exam:  General exam: awake, alert, NAD, chronically ill appearing.  Respiratory system: Clear. No increased work of breathing. Cardiovascular system: normal S1 & S2 heard.  Gastrointestinal system: Abdomen is nondistended, soft and nontender. Normal bowel sounds heard. Central nervous system: Alert and oriented. No focal neurological deficits. Extremities: no cyanosis or clubbing.  Data Reviewed: Basic Metabolic Panel: Recent Labs  Lab 10/13/17 1738 10/14/17 0600  NA 134* 131*  K 2.9* 3.7  CL 89* 87*  CO2 33* 33*  GLUCOSE 97 93  BUN 22* 34*  CREATININE 5.39* 7.00*  CALCIUM 9.1 9.2   Liver Function Tests: Recent Labs  Lab 10/14/17 0600  AST 16  ALT 18  ALKPHOS 76  BILITOT 0.7  PROT 7.1  ALBUMIN 3.6   No results for input(s): LIPASE, AMYLASE in the last 168 hours. No results for input(s): AMMONIA in the last 168 hours. CBC: Recent Labs  Lab 10/13/17 1738 10/14/17 0600  WBC 9.8 12.6*  NEUTROABS 7.4 10.9*  HGB 11.2* 11.4*  HCT 33.5* 34.9*  MCV 93.6 94.6  PLT 106* 106*   Cardiac  Enzymes: No results for input(s): CKTOTAL, CKMB, CKMBINDEX, TROPONINI in the last 168 hours. CBG (last 3)  Recent Labs    10/14/17 0751  GLUCAP 74   Recent Results (from the past 240 hour(s))  MRSA PCR Screening     Status: None   Collection Time: 10/13/17 11:19 PM  Result Value Ref Range Status   MRSA by PCR NEGATIVE NEGATIVE Final    Comment:        The GeneXpert MRSA Assay (FDA approved for NASAL specimens only), is one component of a comprehensive MRSA colonization surveillance program. It is not intended to diagnose MRSA infection nor to guide or monitor treatment  for MRSA infections.      Studies: Ct Renal Stone Study  Result Date: 10/13/2017 CLINICAL DATA:  Right flank pain which is worsening. EXAM: CT ABDOMEN AND PELVIS WITHOUT CONTRAST TECHNIQUE: Multidetector CT imaging of the abdomen and pelvis was performed following the standard protocol without IV contrast. COMPARISON:  None. FINDINGS: Lower chest: No acute abnormality. Hepatobiliary: 6.3 cm benign-appearing cyst in the dome of the liver. Normal appearance of the gallbladder. Pancreas: Unremarkable. No pancreatic ductal dilatation or surrounding inflammatory changes. Spleen: Normal in size without focal abnormality. Adrenals/Urinary Tract: Bilateral cortical thinning with multicystic appearance of the kidneys. The hypoattenuated bilateral renal lesions are incompletely evaluated due to lack of IV contrast. Right periureteral inflammatory changes at the level of the mid right ureter without significant dilation, image 31-46/88, sequence 2, axial images. No radiopaque obstructive calculus is seen. The urinary bladder is minimally distended but normal for its state of distension. Stomach/Bowel: Stomach is within normal limits. No evidence of appendicitis. No evidence of bowel wall thickening, distention, or inflammatory changes. Vascular/Lymphatic: No significant vascular findings are present. No enlarged abdominal or pelvic lymph nodes. Reproductive: Enlarged prostate gland measuring 5.3 cm. Other: No abdominal wall hernia or abnormality. No abdominopelvic ascites. 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 IMPRESSION: Right periureteral inflammatory changes without evidence of obstructive uropathy. This may represent inflammatory changes post passage of ureteral stone, ascending infectious changes or transitional cell malignancy. Multicystic appearance of the kidneys with suboptimal evaluation of the bilateral renal hypoattenuated lesions due to lack  of IV contrast. Prostate gland enlargement. Please correlate to patient's serum PSA values. Electronically Signed   By: Fidela Salisbury M.D.   On: 10/13/2017 18:11   Scheduled Meds: . aspirin EC  81 mg Oral Daily  . benztropine  0.5 mg Oral BID  . carvedilol  25 mg Oral QHS  . cloNIDine  0.3 mg Oral BID  . docusate sodium  100 mg Oral BID  . enoxaparin (LOVENOX) injection  30 mg Subcutaneous Q24H  . haloperidol  7.5 mg Oral QHS  . insulin aspart  0-9 Units Subcutaneous TID WC  . isosorbide mononitrate  90 mg Oral Daily  . lactulose  10 g Oral Daily  . lidocaine-prilocaine  1 application Topical Once per day on Mon Wed Fri  . loratadine  10 mg Oral Daily  . losartan  100 mg Oral Daily  . multivitamin with minerals  1 tablet Oral Daily  . niacin  1,000 mg Oral QHS  . pravastatin  40 mg Oral QHS  . [START ON 10/16/2017] Vitamin D (Ergocalciferol)  50,000 Units Oral Q7 days   Continuous Infusions: . cefTRIAXone (ROCEPHIN)  IV      Principal Problem:   Acute urinary tract infection Active Problems:   ESRD on dialysis ([place])   HTN (hypertension)  Chronic schizoaffective schizophrenia ([facility])   Type 2 diabetes mellitus ([facility])   HLD (hyperlipidemia)   History of CVA (cerebrovascular accident)   Hypokalemia   Anemia in ESRD (end-stage renal disease) ([place])   Thrombocytopenia ([place])  Time spent:   Irwin Brakeman, MD, FAAFP [facility] Pager [phone]  If 7PM-7AM, please contact night-coverage [email or web address] Password [email or web address] 10/14/2017, 8:29 AM    LOS: 1 day

## 2017-10-14 NOTE — Consult Note (Signed)
Lawrence [name] MRN: [id number] DOB/AGE: June 02, 1956 61 y.o. Primary Care Physician:George, Rubbie Battiest, MD Admit date: 10/13/2017 Chief Complaint:  Chief Complaint  Patient presents with  . Flank Pain   HPI: Pt is a 61 year old  male with past  medical history of  ESRD on hemodialysis who came to the emergency department due with c/o flank pain  HPI dates back to yesterday when pt started having flank pain, it was on right side and it was getting  progressively worse . Pt also c/o dysuria and hematuria since this morning while having hemodialysis.  Pt offer no c/o  testicular tenderness or edema.  NO c/o cough or fever but did complain of chills and fatigue.   No chest pain/dyspnea/ palpitations/ dizziness.  NO c/o  abdominal pain/ nausea/ emesis/ NO c/o  diarrhea, melena, hematochezia.[name] Upon evaluation in ER  urine analysis showed  pyuria, bacteriuria,pt later also had CT scan which showed  right periureteral inflammatory changes without evidence of obstructive uropathy.   Pt was admitted with UTI and nephrology was consulted for co-management of dialysis.   Past Medical History:  Diagnosis Date  . Anemia   . Cerebral hemorrhage ([place]) 2012  . Chronic constipation   . Chronic kidney disease   . Diabetes mellitus without complication ([place])   . Hemodialysis patient ([place])   . Hyperlipidemia   . Hypertension   . Schizophrenia ([place])   . Stroke [facility])         Family History  Problem Relation Age of Onset  . Diabetes Mother   . Kidney cancer Mother   . Diabetes Sister   . Stroke Brother   . Prostate cancer Neg Hx   . Bladder Cancer Neg Hx     Social History:  reports that  has never smoked. he has never used smokeless tobacco. He reports that he does not drink alcohol or use drugs.   Allergies:  Allergies  Allergen Reactions  . Nsaids Other (See Comments)    Cannot take due to renal disease    Medications Prior to Admission  Medication Sig Dispense Refill  .  acetaminophen-codeine (TYLENOL #3) 300-30 MG tablet Take 1-2 tablets by mouth every 4 (four) hours as needed for moderate pain.    [name] aspirin EC 81 MG tablet Take 81 mg by mouth daily.    . benztropine (COGENTIN) 0.5 MG tablet Take 0.5 mg by mouth 2 (two) times daily.    . carvedilol (COREG) 25 MG tablet Take 25 mg by mouth at bedtime.     . cetirizine (ZYRTEC) 10 MG tablet Take 10 mg by mouth at bedtime.     . cloNIDine (CATAPRES) 0.3 MG tablet Take 0.3 mg by mouth 2 (two) times daily.    . Coenzyme Q10 100 MG TABS Take 100 mg by mouth at bedtime.    . docusate sodium (COLACE) 100 MG capsule Take 100 mg by mouth 2 (two) times daily.    [name] glipiZIDE (GLUCOTROL XL) 5 MG 24 hr tablet Take 5 mg by mouth daily with breakfast.    . haloperidol (HALDOL) 5 MG tablet Take 7.5 mg by mouth at bedtime. At bedtime     . isosorbide mononitrate (IMDUR) 60 MG 24 hr tablet Take 90 mg by mouth daily.    [name] lactulose (CHRONULAC) 10 GM/15ML solution Take 10 g by mouth daily.    [name] lidocaine-prilocaine (EMLA) cream Apply 1 application topically 3 (three) times a week. Apply over dialysis shunt 30 minutes prior to  dialysis.    [name] losartan (COZAAR) 100 MG tablet Take 1 tablet (100 mg total) by mouth daily. 30 tablet 0  . Multiple Vitamins-Minerals (MULTIVITAMIN WITH MINERALS) tablet Take 1 tablet by mouth daily.    . niacin (NIASPAN) 1000 MG CR tablet Take 1,000 mg by mouth at bedtime.    . pravastatin (PRAVACHOL) 40 MG tablet Take 40 mg by mouth at bedtime.    . Vitamin D, Ergocalciferol, (DRISDOL) 50000 UNITS CAPS capsule Take 50,000 Units by mouth every 7 (seven) days.         BMW:UXLKG from the symptoms mentioned above,there are no other symptoms referable to all systems reviewed.  [name] aspirin EC  81 mg Oral Daily  . benztropine  0.5 mg Oral BID  . carvedilol  25 mg Oral QHS  . cloNIDine  0.3 mg Oral BID  . docusate sodium  100 mg Oral BID  . enoxaparin (LOVENOX) injection  30 mg Subcutaneous Q24H  .  haloperidol  7.5 mg Oral QHS  . insulin aspart  0-9 Units Subcutaneous TID WC  . isosorbide mononitrate  90 mg Oral Daily  . lactulose  10 g Oral Daily  . lidocaine-prilocaine  1 application Topical Once per day on Mon Wed Fri  . loratadine  10 mg Oral Daily  . losartan  100 mg Oral Daily  . multivitamin with minerals  1 tablet Oral Daily  . niacin  1,000 mg Oral QHS  . pravastatin  40 mg Oral QHS  . [START ON 10/16/2017] Vitamin D (Ergocalciferol)  50,000 Units Oral Q7 days      Physical Exam: Vital signs in last 24 hours: Temp:  [98.2 F (36.8 C)-100.1 F (37.8 C)] 100.1 F (37.8 C) (11/07 0717) Pulse Rate:  [74-103] 103 (11/07 0717) Resp:  [16-20] 20 (11/07 0717) BP: (167-197)/(82-113) 167/82 (11/07 0717) SpO2:  [94 %-100 %] 100 % (11/07 0717) Weight:  [156 lb 4.9 oz (70.9 kg)-160 lb (72.6 kg)] 156 lb 4.9 oz (70.9 kg) (11/06 2256) Weight change:  Last BM Date: 10/11/17  Intake/Output from previous day: 11/06 0701 - 11/07 0700 In: 58 [IV Piggyback:50] Out: -  Total I/O In: 240 [P.O.:240] Out: 150 [Urine:150]   Physical Exam: General- pt is awake,alert, oriented to time place and person Resp- No acute REsp distress, CTA B/L NO Rhonchi CVS- S1S2 regular in rate and rhythm GIT- BS+, soft, NT, ND EXT- NO LE Edema, Cyanosis CNS- CN 2-12 grossly intact. Moving all 4 extremities Psych- normal mod and affect Access- AVF+   Lab Results: CBC Recent Labs    10/13/17 1738 10/14/17 0600  WBC 9.8 12.6*  HGB 11.2* 11.4*  HCT 33.5* 34.9*  PLT 106* 106*    BMET Recent Labs    10/13/17 1738 10/14/17 0600  NA 134* 131*  K 2.9* 3.7  CL 89* 87*  CO2 33* 33*  GLUCOSE 97 93  BUN 22* 34*  CREATININE 5.39* 7.00*  CALCIUM 9.1 9.2    MICRO Recent Results (from the past 240 hour(s))  MRSA PCR Screening     Status: None   Collection Time: 10/13/17 11:19 PM  Result Value Ref Range Status   MRSA by PCR NEGATIVE NEGATIVE Final    Comment:        The GeneXpert MRSA  Assay (FDA approved for NASAL specimens only), is one component of a comprehensive MRSA colonization surveillance program. It is not intended to diagnose MRSA infection nor to guide or monitor treatment for MRSA infections.  Lab Results  Component Value Date   CALCIUM 9.2 10/14/2017   PHOS 4.1 04/19/2015      Impression: 1)Renal  ESRD on HD               Pt is on Tuesday /Thursday/Saturday schedule               Pt was last dialyzed yesterday               No need of HD today  2)HTN  Medication- On RAS blockers- On Alpha and beta Blockers. On Vasodilators. On [employer].  3)AnemiaIn ESRD the goal for  HGb is 9--11. hgb at goal Will keep on low dose epo  4)CKD Mineral-Bone Disorder  Phosphorus at goal. Calcium is  at goal.  5)Id-admitted with UTI Primary MD following  6)Electrolytes  Normokalemic  hyponatremic Sec to ESRD  7)Acid base Co2 at goal     Plan:  Will dialyze in am Will keep low dose epo Will use 3k bath      Mark Benecke S 10/14/2017, 10:17 AM

## 2017-10-14 NOTE — Clinical Social Work Note (Signed)
Clinical Social Work Assessment  Patient Details  Name: Lawrence [name] MRN: [id number] Date of Birth: 02-19-1956  Date of referral:  10/14/17               Reason for consult:  Discharge Planning                Permission sought to share information with:    Permission granted to share information::     Name::        Agency::     Relationship::     Contact Information:     Housing/Transportation Living arrangements for the past 2 months:  [place] of Information:  Patient, Facility Patient Interpreter Needed:  None Criminal Activity/Legal Involvement Pertinent to Current Situation/Hospitalization:    Significant Relationships:  Warehouse manager Lives with:  Facility Resident Do you feel safe going back to the place where you live?  Yes Need for family participation in patient care:  No (Coment)  Care giving concerns: No care giving concerns identified.   Social Worker assessment / plan: Pt is a 61 year old male admitted with UTI. Pt resides in [facility]. Met with pt and contacted Ms Berenice Primas from the group home with pt consent to update. Pt has lived at the group home for many years. He is mostly independent in ADLs. Staff assist as needed. Pt receives outpt dialysis at [facility] in [place] on a TRS schedule. He transports either with group home staff or with [facility]. Group home staff administer pt's medications. Alfreda Graves is the group home manager and she will transport at dc. CSW will follow for support and dc planning as needed.  Employment status:  Disabled (Comment on whether or not currently receiving Disability) Insurance information:  Medicare PT Recommendations:    Information / Referral to community resources:     Patient/Family's Response to care: Pt accepting of care.  Patient/Family's Understanding of and Emotional Response to Diagnosis, Current Treatment, and Prognosis: Pt appears to understand condition and treatment plan. Pt accepting of  all,  Emotional Assessment Appearance:  Appears stated age Attitude/Demeanor/Rapport:    Affect (typically observed):  Accepting, Calm Orientation:  Oriented to Self, Oriented to Place, Oriented to  Time, Oriented to Situation Alcohol / Substance use:    Psych involvement (Current and /or in the community):     Discharge Needs  Concerns to be addressed:    Readmission within the last 30 days:    Current discharge risk:  None Barriers to Discharge:  No Barriers Identified   Shade Flood, LCSW 10/14/2017, 11:54 AM

## 2017-10-14 NOTE — Care Management (Signed)
CM consulted due to patient being from a family care home. This is a CSW function. CSW consulted.

## 2017-10-15 DIAGNOSIS — E876 Hypokalemia: Secondary | ICD-10-CM

## 2017-10-15 LAB — GLUCOSE, CAPILLARY
GLUCOSE-CAPILLARY: 94 mg/dL (ref 65–99)
GLUCOSE-CAPILLARY: 127 mg/dL — AB (ref 65–99)
GLUCOSE-CAPILLARY: 188 mg/dL — AB (ref 65–99)
Glucose-Capillary: 225 mg/dL — ABNORMAL HIGH (ref 65–99)
Glucose-Capillary: 165 mg/dL — ABNORMAL HIGH (ref 65–99)

## 2017-10-15 LAB — RENAL FUNCTION PANEL
ALBUMIN: 3.2 g/dL — AB (ref 3.5–5.0)
ANION GAP: 14 (ref 5–15)
BUN: 59 mg/dL — ABNORMAL HIGH (ref 6–20)
CALCIUM: 9 mg/dL (ref 8.9–10.3)
CO2: 28 mmol/L (ref 22–32)
Chloride: 88 mmol/L — ABNORMAL LOW (ref 101–111)
Creatinine, Ser: 9.65 mg/dL — ABNORMAL HIGH (ref 0.61–1.24)
GFR calc non Af Amer: 5 mL/min — ABNORMAL LOW (ref >60)
GFR, EST AFRICAN AMERICAN: 6 mL/min — AB (ref >60)
GLUCOSE: 86 mg/dL (ref 65–99)
PHOSPHORUS: 2.5 mg/dL (ref 2.5–4.6)
POTASSIUM: 4.1 mmol/L (ref 3.5–5.1)
SODIUM: 130 mmol/L — AB (ref 135–145)

## 2017-10-15 LAB — CBC
HEMATOCRIT: 30.6 % — AB (ref 39.0–52.0)
HEMOGLOBIN: 10.2 g/dL — AB (ref 13.0–17.0)
MCH: 31.4 pg (ref 26.0–34.0)
MCHC: 33.3 g/dL (ref 30.0–36.0)
MCV: 94.2 fL (ref 78.0–100.0)
Platelets: 95 10*3/uL — ABNORMAL LOW (ref 150–400)
RBC: 3.25 MIL/uL — AB (ref 4.22–5.81)
RDW: 16 % — ABNORMAL HIGH (ref 11.5–15.5)
WBC: 14.8 10*3/uL — AB (ref 4.0–10.5)

## 2017-10-15 LAB — HIV ANTIBODY (ROUTINE TESTING W REFLEX): HIV Screen 4th Generation wRfx: NONREACTIVE

## 2017-10-15 LAB — HEPATITIS B SURFACE ANTIGEN: Hepatitis B Surface Ag: NEGATIVE

## 2017-10-15 MED ORDER — LIDOCAINE-PRILOCAINE 2.5-2.5 % EX CREA: 1.0000 "application " | TOPICAL_CREAM | CUTANEOUS | Status: DC | PRN

## 2017-10-15 MED ORDER — LIDOCAINE HCL (PF) 1 % IJ SOLN: 5.0000 mL | INTRAMUSCULAR | Status: DC | PRN

## 2017-10-15 MED ORDER — GUAIFENESIN ER 600 MG PO TB12: 600.0000 mg | ORAL_TABLET | Freq: Two times a day (BID) | ORAL | Status: DC | PRN

## 2017-10-15 MED ORDER — ALTEPLASE 2 MG IJ SOLR
2.0000 mg | Freq: Once | INTRAMUSCULAR | Status: DC | PRN
  Filled 2017-10-15: qty 2

## 2017-10-15 MED ORDER — EPOETIN ALFA 2000 UNIT/ML IJ SOLN
INTRAMUSCULAR | Status: AC
  Administered 2017-10-15: 2000 [IU] via SUBCUTANEOUS
  Filled 2017-10-15: qty 1

## 2017-10-15 MED ORDER — AMLODIPINE BESYLATE 5 MG PO TABS
2.5000 mg | ORAL_TABLET | Freq: Two times a day (BID) | ORAL | Status: DC
  Administered 2017-10-15 – 2017-10-16 (×3): 2.5 mg via ORAL
  Filled 2017-10-15 (×3): qty 1

## 2017-10-15 MED ORDER — PENTAFLUOROPROP-TETRAFLUOROETH EX AERO: 1.0000 "application " | INHALATION_SPRAY | CUTANEOUS | Status: DC | PRN

## 2017-10-15 MED ORDER — HEPARIN SODIUM (PORCINE) 1000 UNIT/ML DIALYSIS
20.0000 [IU]/kg | INTRAMUSCULAR | Status: DC | PRN
  Filled 2017-10-15: qty 2

## 2017-10-15 MED ORDER — FLUTICASONE PROPIONATE 50 MCG/ACT NA SUSP: 2.0000 | Freq: Every day | NASAL | Status: DC | PRN

## 2017-10-15 MED ORDER — SODIUM CHLORIDE 0.9 % IV SOLN: 100.0000 mL | INTRAVENOUS | Status: DC | PRN

## 2017-10-15 MED ORDER — HEPARIN SODIUM (PORCINE) 1000 UNIT/ML DIALYSIS
1000.0000 [IU] | INTRAMUSCULAR | Status: DC | PRN
  Filled 2017-10-15: qty 1

## 2017-10-15 NOTE — Progress Notes (Signed)
Subjective: Interval History: has no complaint of fever, chills or sweating.  Flank pain is much better.  Presently he offers no complaints..  Objective: Vital signs in last 24 hours: Temp:  [98.4 F (36.9 C)-102 F (38.9 C)] 99.7 F (37.6 C) (11/08 0500) Pulse Rate:  [88-127] 96 (11/08 0500) Resp:  [19-22] 19 (11/08 0500) BP: (124-174)/(70-88) 166/81 (11/08 0500) SpO2:  [98 %-100 %] 98 % (11/08 0500) Weight change:   Intake/Output from previous day: 11/07 0701 - 11/08 0700 In: 530 [P.O.:480; IV Piggyback:50] Out: 350 [Urine:350] Intake/Output this shift: No intake/output data recorded.  General appearance: alert, cooperative and no distress Resp: clear to auscultation bilaterally Cardio: regular rate and rhythm Extremities: No edema  Lab Results: Recent Labs    10/14/17 0600 10/15/17 0414  WBC 12.6* 14.8*  HGB 11.4* 10.2*  HCT 34.9* 30.6*  PLT 106* 95*   BMET:  Recent Labs    10/14/17 0600 10/15/17 0414  NA 131* 130*  K 3.7 4.1  CL 87* 88*  CO2 33* 28  GLUCOSE 93 86  BUN 34* 59*  CREATININE 7.00* 9.65*  CALCIUM 9.2 9.0   No results for input(s): PTH in the last 72 hours. Iron Studies: No results for input(s): IRON, TIBC, TRANSFERRIN, FERRITIN in the last 72 hours.  Studies/Results: Dg Chest Port 1 View  Result Date: 10/14/2017 CLINICAL DATA:  Fever, end-stage dialysis dependent renal failure. , diabetes, urinary tract infection. EXAM: PORTABLE CHEST 1 VIEW COMPARISON:  None in PACs FINDINGS: The lungs are adequately inflated. The interstitial markings are mildly prominent diffusely. There is no alveolar infiltrate or pleural effusion. The heart is normal in size. The pulmonary vascularity is not engorged. The bony thorax exhibits no acute abnormality. IMPRESSION: Mild interstitial prominence bilaterally may be acute or chronic. No alveolar pneumonia nor definite pulmonary edema. Electronically Signed   By: David  Martinique M.D.   On: 10/14/2017 12:32   Ct  Renal Stone Study  Result Date: 10/13/2017 CLINICAL DATA:  Right flank pain which is worsening. EXAM: CT ABDOMEN AND PELVIS WITHOUT CONTRAST TECHNIQUE: Multidetector CT imaging of the abdomen and pelvis was performed following the standard protocol without IV contrast. COMPARISON:  None. FINDINGS: Lower chest: No acute abnormality. Hepatobiliary: 6.3 cm benign-appearing cyst in the dome of the liver. Normal appearance of the gallbladder. Pancreas: Unremarkable. No pancreatic ductal dilatation or surrounding inflammatory changes. Spleen: Normal in size without focal abnormality. Adrenals/Urinary Tract: Bilateral cortical thinning with multicystic appearance of the kidneys. The hypoattenuated bilateral renal lesions are incompletely evaluated due to lack of IV contrast. Right periureteral inflammatory changes at the level of the mid right ureter without significant dilation, image [id number], sequence 2, axial images. No radiopaque obstructive calculus is seen. The urinary bladder is minimally distended but normal for its state of distension. Stomach/Bowel: Stomach is within normal limits. No evidence of appendicitis. No evidence of bowel wall thickening, distention, or inflammatory changes. Vascular/Lymphatic: No significant vascular findings are present. No enlarged abdominal or pelvic lymph nodes. Reproductive: Enlarged prostate gland measuring 5.3 cm. Other: No abdominal wall hernia or abnormality. No abdominopelvic ascites. 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 IMPRESSION: Right periureteral inflammatory changes without evidence of obstructive uropathy. This may represent inflammatory changes post passage of ureteral stone, ascending infectious changes or transitional cell malignancy. Multicystic appearance of the kidneys with suboptimal evaluation of the bilateral renal hypoattenuated lesions due to lack of IV contrast. Prostate gland enlargement.  Please correlate to patient's serum PSA values.  Electronically Signed   By: Fidela Salisbury M.D.   On: 10/13/2017 18:11    I have reviewed the patient's current medications.  Assessment/Plan: Problem #1 UTI: Presently patient is on antibiotics.  He is feeling much better.  He denies any fever or chills and sweating.  Patient presently afebrile but his white blood cell count is high. Problem #2 end-stage renal disease: His potassium is normal and patient does not have any uremic signs and symptoms.  Patient's due for dialysis today Problem #3 bone and mineral disorder: His calcium and phosphorus is range Problem #4 hypertension: His blood pressure is somewhat high but stable Problem #5 anemia: His hemoglobin is within our target range Problem #6 fluid management: No sign of fluid overload. Problem #7 history of diabetes: His blood sugar is reasonably controlled Problem #8 history of schizophrenia Problem #9 history of CVA Plan: 1]We will make arrangements for patient to get dialysis today 2]Will remove about 2 L if systolic blood pressure remains above 90.  3] we will check his renal panel and CBC in the morning. 4] when patient is discharged to go back to his regular unit at [facility].   LOS: 2 days   Junko Ohagan S 10/15/2017,8:52 AM

## 2017-10-15 NOTE — Clinical Social Work Note (Signed)
LCSW following. Reviewed pt's record today and discussed pt's status with RN CM. MD notes indicate dc likely in 1-2 days. Updated group home manager by phone. She is asking LCSW about pt's blood pressure stating that she feels it has been high at the home prior to this admission and she is concerned because pt has had a stroke in the past. She informs LCSW that she discussed this with pt's nephrologist about 3 weeks ago and pt's amlodipine was increased but she has not seen a decline in pt's blood pressures at all even with the increase in the medication dosage. LCSW relayed this information to RN CM for follow up. Requested new MAR from the group home to update pt's record. Will continue to follow for dc planning.

## 2017-10-15 NOTE — Progress Notes (Signed)
PROGRESS NOTE  Lawrence [name]  KGM:[id number]  DOB: [birth year]  DOA: 10/13/2017 PCP: Sharyne Peach, MD   Brief Admission Hx: Lawrence [name] is a 61 y.o. male with medical history significant of anemia, cerebral hemorrhage, chronic constipation, chronic kidney disease, type 2 diabetes, ESRD on hemodialysis, hyperlipidemia, hypertension, schizoaffective schizophrenia who is coming to the emergency department due to progressively worse right flank pain associated with dysuria and hematuria since this morning while having hemodialysis.  He was admitted with UTI.     MDM/Assessment & Plan:   1. UTI - admitted for IV antibiotics treatment and to follow urine culture and blood culture.  Clinically patient is reporting some mild improvement, continue current therapy. Urine culture pending.  BC no growth to date.  2. Leukocytosis - suspect secondary to UTI, following.  3. ESRD on HD - consulted nephrology team. He has HD on Tue, Thu, Sat.  He will have HD today.  4. Essential Hypertension - resume home BP meds and follow.  5. Schizophrenia - resume home psychiatric medications.  6. Type 2 DM - DC glipizide, sensitive sliding scale ordered.  7. Hyperlipidemia - Resume home meds.  8. Cerebrovascular Disease with history of CVA - Continue aspirin for secondary prevention. 9. Hypokalemia - follow and replete as needed. Check magnesium.  10. Anemia of CKD - Following.   11. Thrombocytopenia - stable, following.   DVT prophylaxis: heparin Code Status: Full  Family Communication: none present Disposition Plan: home in 1-2 days   Consultants:  nephrology   Subjective: Pt says that he is feeling better today.   Objective: Vitals:   10/14/17 1320 10/14/17 1529 10/14/17 2103 10/15/17 0500  BP: 124/70  (!) 153/83 (!) 166/81  Pulse: (!) 106  88 96  Resp: 20  20 19   Temp: (!) 102 F (38.9 C) 98.9 F (37.2 C) 98.4 F (36.9 C) 99.7 F (37.6 C)  TempSrc: Oral Oral Oral Oral  SpO2: 100%   100% 98%  Weight:      Height:        Intake/Output Summary (Last 24 hours) at 10/15/2017 0930 Last data filed at 10/15/2017 0505 Gross per 24 hour  Intake 290 ml  Output 200 ml  Net 90 ml   Filed Weights   10/13/17 1643 10/13/17 2256  Weight: 72.6 kg (160 lb) 70.9 kg (156 lb 4.9 oz)     REVIEW OF SYSTEMS  As per history otherwise all reviewed and reported negative  Exam:  General exam: awake, alert, NAD, chronically ill appearing.  Respiratory system: Clear. No increased work of breathing. Cardiovascular system: normal S1 & S2 heard.  Gastrointestinal system: Abdomen is nondistended, soft and nontender. Normal bowel sounds heard. Central nervous system: Alert and oriented. No focal neurological deficits. Extremities: no cyanosis or clubbing.  Data Reviewed: Basic Metabolic Panel: Recent Labs  Lab 10/13/17 1738 10/14/17 0600 10/15/17 0414  NA 134* 131* 130*  K 2.9* 3.7 4.1  CL 89* 87* 88*  CO2 33* 33* 28  GLUCOSE 97 93 86  BUN 22* 34* 59*  CREATININE 5.39* 7.00* 9.65*  CALCIUM 9.1 9.2 9.0  PHOS  --   --  2.5   Liver Function Tests: Recent Labs  Lab 10/14/17 0600 10/15/17 0414  AST 16  --   ALT 18  --   ALKPHOS 76  --   BILITOT 0.7  --   PROT 7.1  --   ALBUMIN 3.6 3.2*   No results for input(s): LIPASE, AMYLASE in the last  168 hours. No results for input(s): AMMONIA in the last 168 hours. CBC: Recent Labs  Lab 10/13/17 1738 10/14/17 0600 10/15/17 0414  WBC 9.8 12.6* 14.8*  NEUTROABS 7.4 10.9*  --   HGB 11.2* 11.4* 10.2*  HCT 33.5* 34.9* 30.6*  MCV 93.6 94.6 94.2  PLT 106* 106* 95*   Cardiac Enzymes: No results for input(s): CKTOTAL, CKMB, CKMBINDEX, TROPONINI in the last 168 hours. CBG (last 3)  Recent Labs    10/14/17 1556 10/14/17 2132 10/15/17 0734  GLUCAP 172* 166* 94   Recent Results (from the past 240 hour(s))  MRSA PCR Screening     Status: None   Collection Time: 10/13/17 11:19 PM  Result Value Ref Range Status   MRSA by  PCR NEGATIVE NEGATIVE Final    Comment:        The GeneXpert MRSA Assay (FDA approved for NASAL specimens only), is one component of a comprehensive MRSA colonization surveillance program. It is not intended to diagnose MRSA infection nor to guide or monitor treatment for MRSA infections.   Culture, blood (Routine X 2) w Reflex to ID Panel     Status: None (Preliminary result)   Collection Time: 10/14/17 12:37 PM  Result Value Ref Range Status   Specimen Description RIGHT ANTECUBITAL  Final   Special Requests   Final    BOTTLES DRAWN AEROBIC AND ANAEROBIC Blood Culture adequate volume   Culture NO GROWTH < 24 HOURS  Final   Report Status PENDING  Incomplete  Culture, blood (Routine X 2) w Reflex to ID Panel     Status: None (Preliminary result)   Collection Time: 10/14/17 12:50 PM  Result Value Ref Range Status   Specimen Description BLOOD RIGHT ARM  Final   Special Requests   Final    BOTTLES DRAWN AEROBIC AND ANAEROBIC Blood Culture adequate volume   Culture NO GROWTH < 24 HOURS  Final   Report Status PENDING  Incomplete     Studies: Dg Chest Port 1 View  Result Date: 10/14/2017 CLINICAL DATA:  Fever, end-stage dialysis dependent renal failure. , diabetes, urinary tract infection. EXAM: PORTABLE CHEST 1 VIEW COMPARISON:  None in PACs FINDINGS: The lungs are adequately inflated. The interstitial markings are mildly prominent diffusely. There is no alveolar infiltrate or pleural effusion. The heart is normal in size. The pulmonary vascularity is not engorged. The bony thorax exhibits no acute abnormality. IMPRESSION: Mild interstitial prominence bilaterally may be acute or chronic. No alveolar pneumonia nor definite pulmonary edema. Electronically Signed   By: David  Martinique M.D.   On: 10/14/2017 12:32   Ct Renal Stone Study  Result Date: 10/13/2017 CLINICAL DATA:  Right flank pain which is worsening. EXAM: CT ABDOMEN AND PELVIS WITHOUT CONTRAST TECHNIQUE: Multidetector CT imaging  of the abdomen and pelvis was performed following the standard protocol without IV contrast. COMPARISON:  None. FINDINGS: Lower chest: No acute abnormality. Hepatobiliary: 6.3 cm benign-appearing cyst in the dome of the liver. Normal appearance of the gallbladder. Pancreas: Unremarkable. No pancreatic ductal dilatation or surrounding inflammatory changes. Spleen: Normal in size without focal abnormality. Adrenals/Urinary Tract: Bilateral cortical thinning with multicystic appearance of the kidneys. The hypoattenuated bilateral renal lesions are incompletely evaluated due to lack of IV contrast. Right periureteral inflammatory changes at the level of the mid right ureter without significant dilation, image [id number], sequence 2, axial images. No radiopaque obstructive calculus is seen. The urinary bladder is minimally distended but normal for its state of distension. Stomach/Bowel: Stomach is within  normal limits. No evidence of appendicitis. No evidence of bowel wall thickening, distention, or inflammatory changes. Vascular/Lymphatic: No significant vascular findings are present. No enlarged abdominal or pelvic lymph nodes. Reproductive: Enlarged prostate gland measuring 5.3 cm. Other: No abdominal wall hernia or abnormality. No abdominopelvic ascites. 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 IMPRESSION: Right periureteral inflammatory changes without evidence of obstructive uropathy. This may represent inflammatory changes post passage of ureteral stone, ascending infectious changes or transitional cell malignancy. Multicystic appearance of the kidneys with suboptimal evaluation of the bilateral renal hypoattenuated lesions due to lack of IV contrast. Prostate gland enlargement. Please correlate to patient's serum PSA values. Electronically Signed   By: Fidela Salisbury M.D.   On: 10/13/2017 18:11   Scheduled Meds: . aspirin EC  81 mg Oral Daily  .  benztropine  0.5 mg Oral BID  . carvedilol  25 mg Oral BID  . cloNIDine  0.3 mg Oral BID  . docusate sodium  100 mg Oral BID  . enoxaparin (LOVENOX) injection  30 mg Subcutaneous Q24H  . epoetin (EPOGEN/PROCRIT) injection  2,000 Units Subcutaneous Q T,Th,Sa-HD  . haloperidol  7.5 mg Oral QHS  . insulin aspart  0-9 Units Subcutaneous TID WC  . isosorbide mononitrate  90 mg Oral Daily  . lactulose  10 g Oral Daily  . lidocaine-prilocaine  1 application Topical Q T,Th,Sa-HD  . loratadine  10 mg Oral Daily  . losartan  100 mg Oral Daily  . multivitamin with minerals  1 tablet Oral Daily  . niacin  1,000 mg Oral QHS  . pravastatin  40 mg Oral QHS  . [START ON 10/16/2017] Vitamin D (Ergocalciferol)  50,000 Units Oral Q7 days   Continuous Infusions: . sodium chloride    . sodium chloride    . cefTRIAXone (ROCEPHIN)  IV Stopped (10/14/17 2230)    Principal Problem:   Acute urinary tract infection Active Problems:   ESRD on dialysis ([place])   HTN (hypertension)   Chronic schizoaffective schizophrenia ([place])   Type 2 diabetes mellitus (HCC)   HLD (hyperlipidemia)   History of CVA (cerebrovascular accident)   Hypokalemia   Anemia in ESRD (end-stage renal disease) ([place])   Thrombocytopenia ([place])  Time spent:   Irwin Brakeman, MD, FAAFP [facility] Pager [phone]  If 7PM-7AM, please contact night-coverage [email or web address] Password [email or web address] 10/15/2017, 9:30 AM    LOS: 2 days

## 2017-10-16 ENCOUNTER — Encounter (HOSPITAL_COMMUNITY): Payer: Self-pay | Admitting: Family Medicine

## 2017-10-16 DIAGNOSIS — I10 Essential (primary) hypertension: Secondary | ICD-10-CM

## 2017-10-16 LAB — CBC
HEMATOCRIT: 30.2 % — AB (ref 39.0–52.0)
Hemoglobin: 10.1 g/dL — ABNORMAL LOW (ref 13.0–17.0)
MCH: 31.7 pg (ref 26.0–34.0)
MCHC: 33.4 g/dL (ref 30.0–36.0)
MCV: 94.7 fL (ref 78.0–100.0)
Platelets: 79 10*3/uL — ABNORMAL LOW (ref 150–400)
RBC: 3.19 MIL/uL — ABNORMAL LOW (ref 4.22–5.81)
RDW: 16 % — ABNORMAL HIGH (ref 11.5–15.5)
WBC: 8.2 10*3/uL (ref 4.0–10.5)

## 2017-10-16 LAB — RENAL FUNCTION PANEL
ANION GAP: 12 (ref 5–15)
Albumin: 3.1 g/dL — ABNORMAL LOW (ref 3.5–5.0)
BUN: 38 mg/dL — ABNORMAL HIGH (ref 6–20)
CO2: 28 mmol/L (ref 22–32)
Calcium: 9 mg/dL (ref 8.9–10.3)
Chloride: 89 mmol/L — ABNORMAL LOW (ref 101–111)
Creatinine, Ser: 7.54 mg/dL — ABNORMAL HIGH (ref 0.61–1.24)
GFR calc Af Amer: 8 mL/min — ABNORMAL LOW (ref >60)
GFR calc non Af Amer: 7 mL/min — ABNORMAL LOW (ref >60)
GLUCOSE: 116 mg/dL — AB (ref 65–99)
PHOSPHORUS: 3.4 mg/dL (ref 2.5–4.6)
POTASSIUM: 3.9 mmol/L (ref 3.5–5.1)
Sodium: 129 mmol/L — ABNORMAL LOW (ref 135–145)

## 2017-10-16 LAB — GLUCOSE, CAPILLARY
GLUCOSE-CAPILLARY: 163 mg/dL — AB (ref 65–99)
Glucose-Capillary: 120 mg/dL — ABNORMAL HIGH (ref 65–99)

## 2017-10-16 LAB — URINE CULTURE

## 2017-10-16 MED ORDER — CIPROFLOXACIN HCL 500 MG PO TABS: 500.0000 mg | ORAL_TABLET | ORAL | 0 refills | Status: AC

## 2017-10-16 NOTE — Discharge Instructions (Signed)
ANTIBIOTICS: TAKE DOSE ONCE DAILY AFTER HEMODIALYSIS     Follow with Primary MD  Sharyne Peach, MD  and other consultant's as instructed your Hospitalist MD  Please get a complete blood count and chemistry panel checked by your Primary MD at your next visit, and again as instructed by your Primary MD.  Get Medicines reviewed and adjusted: Please take all your medications with you for your next visit with your Primary MD  Laboratory/radiological data: Please request your Primary MD to go over all hospital tests and procedure/radiological results at the follow up, please ask your Primary MD to get all Hospital records sent to his/her office.  In some cases, they will be blood work, cultures and biopsy results pending at the time of your discharge. Please request that your primary care M.D. follows up on these results.  Also Note the following: If you experience worsening of your admission symptoms, develop shortness of breath, life threatening emergency, suicidal or homicidal thoughts you must seek medical attention immediately by calling 911 or calling your MD immediately  if symptoms less severe.  You must read complete instructions/literature along with all the possible adverse reactions/side effects for all the Medicines you take and that have been prescribed to you. Take any new Medicines after you have completely understood and accpet all the possible adverse reactions/side effects.   Do not drive when taking Pain medications or sleeping medications (Benzodaizepines)  Do not take more than prescribed Pain, Sleep and Anxiety Medications. It is not advisable to combine anxiety,sleep and pain medications without talking with your primary care practitioner  Special Instructions: If you have smoked or chewed Tobacco  in the last 2 yrs please stop smoking, stop any regular Alcohol  and or any Recreational drug use.  Wear Seat belts while driving.  Please note: You were cared for by a  hospitalist during your hospital stay. Once you are discharged, your primary care physician will handle any further medical issues. Please note that NO REFILLS for any discharge medications will be authorized once you are discharged, as it is imperative that you return to your primary care physician (or establish a relationship with a primary care physician if you do not have one) for your post hospital discharge needs so that they can reassess your need for medications and monitor your lab values.

## 2017-10-16 NOTE — NC FL2 (Signed)
[place] LEVEL OF CARE SCREENING TOOL     IDENTIFICATION  Patient Name: Lawrence [name] Birthdate: 10/12/1956 Sex: male Admission Date (Current Location): 10/13/2017  [facility] and [place] Number:  [employer] and Address:  [facility] [address] [place]      Provider Number: [id number]  Attending Physician Name and Address:  Murlean Iba, MD  Relative Name and Phone Number:       Current Level of Care: Hospital Recommended Level of Care: [facility] Prior Approval Number:    Date Approved/Denied:   PASRR Number:    Discharge Plan: Home    Current Diagnoses: Patient Active Problem List   Diagnosis Date Noted  . Hypokalemia 10/14/2017  . Anemia in ESRD (end-stage renal disease) ([place]) 10/14/2017  . Thrombocytopenia ([place]) 10/14/2017  . Acute urinary tract infection 10/13/2017  . History of CVA (cerebrovascular accident) 10/13/2017  . Chronic schizoaffective schizophrenia ([place]) 04/09/2016  . Complication of diabetes mellitus ([place]) 03/09/2016  . Chronic kidney disease, stage V ([place]) 03/09/2016  . Thyroid nodule 03/09/2016  . End stage renal failure on dialysis ([place]) 05/02/2015  . ESRD on dialysis ([place]) 04/16/2015  . ESRD (end stage renal disease) ([place]) 04/16/2015  . HTN (hypertension) 04/16/2015  . Big thyroid 02/27/2015  . Dry mouth 02/27/2015  . Focal and segmental hyalinosis 02/26/2015  . Chronic kidney disease, stage IV (severe) ([place]) 01/21/2015  . Controlled type 2 diabetes mellitus without complication ([place]) 94/70/9628  . Essential (primary) hypertension 01/21/2015  . Encounter for screening for malignant neoplasm of colon 08/02/2014  . Absolute anemia 05/12/2013  . Avitaminosis D 05/12/2013  . Cerebrovascular accident (CVA) ([place]) 04/19/2013  . Type 2 diabetes mellitus ([place]) 04/19/2013  . HLD (hyperlipidemia) 04/19/2013  . BP (high blood pressure) 04/19/2013    Orientation RESPIRATION BLADDER  Height & Weight     Self, Time, Situation, Place  Normal Continent Weight: 156 lb 4.9 oz (70.9 kg) Height:  5\' 9"  (175.3 cm)  BEHAVIORAL SYMPTOMS/MOOD NEUROLOGICAL BOWEL NUTRITION STATUS      Continent Diet(Renal/Carb modified with fluid restriction)  AMBULATORY STATUS COMMUNICATION OF NEEDS Skin   Independent Verbally Normal                       Personal Care Assistance Level of Assistance              Functional Limitations Info             SPECIAL CARE FACTORS FREQUENCY                       Contractures Contractures Info: Not present    Additional Factors Info  Code Status Code Status Info: full code             Current Medications (10/16/2017):  This is the current hospital active medication list Current Facility-Administered Medications  Medication Dose Route Frequency Provider Last Rate Last Dose  . 0.9 %  sodium chloride infusion  100 mL Intravenous PRN Bhutani, Manpreet S, MD      . 0.9 %  sodium chloride infusion  100 mL Intravenous PRN Bhutani, Manpreet S, MD      . acetaminophen (TYLENOL) tablet 650 mg  650 mg Oral Q6H PRN Wynetta Emery, Clanford L, MD   650 mg at 10/15/17 2155  . alteplase (CATHFLO ACTIVASE) injection 2 mg  2 mg Intracatheter Once PRN Liana Gerold, MD      .  amLODipine (NORVASC) tablet 2.5 mg  2.5 mg Oral BID Johnson, Clanford L, MD   2.5 mg at 10/16/17 1013  . aspirin EC tablet 81 mg  81 mg Oral Daily Reubin Milan, MD   81 mg at 10/16/17 1013  . benztropine (COGENTIN) tablet 0.5 mg  0.5 mg Oral BID Reubin Milan, MD   0.5 mg at 10/16/17 1014  . carvedilol (COREG) tablet 25 mg  25 mg Oral BID Johnson, Clanford L, MD   25 mg at 10/16/17 1012  . cefTRIAXone (ROCEPHIN) 1 g in dextrose 5 % 50 mL IVPB  1 g Intravenous Q24H Reubin Milan, MD   Stopped at 10/15/17 2353  . cloNIDine (CATAPRES) tablet 0.3 mg  0.3 mg Oral BID Reubin Milan, MD   0.3 mg at 10/16/17 1013  . docusate sodium (COLACE) capsule 100  mg  100 mg Oral BID Reubin Milan, MD   100 mg at 10/16/17 1012  . enoxaparin (LOVENOX) injection 30 mg  30 mg Subcutaneous Q24H Reubin Milan, MD   30 mg at 10/15/17 2353  . epoetin alfa (EPOGEN,PROCRIT) injection 2,000 Units  2,000 Units Subcutaneous Q T,Th,Sa-HD Liana Gerold, MD   2,000 Units at 10/15/17 1150  . fluticasone (FLONASE) 50 MCG/ACT nasal spray 2 spray  2 spray Each Nare Daily PRN Johnson, Clanford L, MD      . guaiFENesin (MUCINEX) 12 hr tablet 600 mg  600 mg Oral BID PRN Johnson, Clanford L, MD      . haloperidol (HALDOL) tablet 7.5 mg  7.5 mg Oral QHS Reubin Milan, MD   7.5 mg at 10/15/17 2154  . heparin injection 1,000 Units  1,000 Units Dialysis PRN Bhutani, Manpreet S, MD      . heparin injection 1,400 Units  20 Units/kg Dialysis PRN Bhutani, Manpreet S, MD      . insulin aspart (novoLOG) injection 0-9 Units  0-9 Units Subcutaneous TID WC Johnson, Clanford L, MD   1 Units at 10/15/17 1345  . isosorbide mononitrate (IMDUR) 24 hr tablet 90 mg  90 mg Oral Daily Reubin Milan, MD   90 mg at 10/16/17 1013  . lactulose (CHRONULAC) 10 GM/15ML solution 10 g  10 g Oral Daily Reubin Milan, MD   10 g at 10/16/17 1012  . lidocaine (PF) (XYLOCAINE) 1 % injection 5 mL  5 mL Intradermal PRN Bhutani, Manpreet S, MD      . lidocaine-prilocaine (EMLA) cream 1 application  1 application Topical Q T,Th,Sa-HD Johnson, Clanford L, MD   1 application at 74/08/14 1344  . lidocaine-prilocaine (EMLA) cream 1 application  1 application Topical PRN Bhutani, Manpreet S, MD      . loratadine (CLARITIN) tablet 10 mg  10 mg Oral Daily Reubin Milan, MD   10 mg at 10/16/17 1012  . losartan (COZAAR) tablet 100 mg  100 mg Oral Daily Reubin Milan, MD   100 mg at 10/16/17 1012  . metoprolol tartrate (LOPRESSOR) injection 5 mg  5 mg Intravenous Q5 min PRN Johnson, Clanford L, MD   5 mg at 10/14/17 1220  . morphine 2 MG/ML injection 2 mg  2 mg Intravenous Q3H PRN  Reubin Milan, MD   2 mg at 10/14/17 0340  . multivitamin with minerals tablet 1 tablet  1 tablet Oral Daily Johnson, Clanford L, MD   1 tablet at 10/16/17 1012  . niacin (NIASPAN) CR tablet 1,000 mg  1,000 mg Oral  QHS Reubin Milan, MD   1,000 mg at 10/15/17 2154  . pentafluoroprop-tetrafluoroeth (GEBAUERS) aerosol 1 application  1 application Topical PRN Bhutani, Manpreet S, MD      . pravastatin (PRAVACHOL) tablet 40 mg  40 mg Oral QHS Reubin Milan, MD   40 mg at 10/15/17 2155  . Vitamin D (Ergocalciferol) (DRISDOL) capsule 50,000 Units  50,000 Units Oral Q7 days Reubin Milan, MD   50,000 Units at 10/16/17 1012     Discharge Medications:  Current Discharge Medication List       START taking these medications   Details  ciprofloxacin (CIPRO) 500 MG tablet Take 1 tablet (500 mg total) Every Tuesday,Thursday,and Saturday with dialysis for 10 days by mouth. TAKE AFTER DIALYSIS Qty: 5 tablet, Refills: 0         CONTINUE these medications which have NOT CHANGED   Details  amLODipine (NORVASC) 2.5 MG tablet Take 2.5 mg 2 (two) times daily by mouth.    aspirin EC 81 MG tablet Take 81 mg by mouth daily.    benztropine (COGENTIN) 0.5 MG tablet Take 0.5 mg by mouth 2 (two) times daily.    carvedilol (COREG) 25 MG tablet Take 25 mg by mouth at bedtime.     cetirizine (ZYRTEC) 10 MG tablet Take 10 mg by mouth at bedtime.     cloNIDine (CATAPRES) 0.3 MG tablet Take 0.3 mg by mouth 2 (two) times daily.    Coenzyme Q10 100 MG TABS Take 100 mg by mouth at bedtime.    docusate sodium (COLACE) 100 MG capsule Take 100 mg by mouth 2 (two) times daily.    fluticasone (FLONASE) 50 MCG/ACT nasal spray Place 2 sprays daily as needed into both nostrils for allergies or rhinitis.    glipiZIDE (GLUCOTROL XL) 5 MG 24 hr tablet Take 5 mg by mouth daily with breakfast.    guaiFENesin (MUCINEX) 600 MG 12 hr tablet Take 600 mg 2 (two) times daily as needed by  mouth (congestion).    haloperidol (HALDOL) 5 MG tablet Take 7.5 mg by mouth at bedtime. At bedtime     isosorbide mononitrate (IMDUR) 60 MG 24 hr tablet Take 90 mg by mouth daily.    lactulose (CHRONULAC) 10 GM/15ML solution Take 10 g by mouth daily.    lidocaine-prilocaine (EMLA) cream Apply 1 application topically 3 (three) times a week. Apply over dialysis shunt 30 minutes prior to dialysis.    losartan (COZAAR) 100 MG tablet Take 1 tablet (100 mg total) by mouth daily. Qty: 30 tablet, Refills: 0    Multiple Vitamins-Minerals (MULTIVITAMIN WITH MINERALS) tablet Take 1 tablet by mouth daily.    niacin (NIASPAN) 1000 MG CR tablet Take 1,000 mg by mouth at bedtime.    pravastatin (PRAVACHOL) 40 MG tablet Take 40 mg by mouth at bedtime.    Vitamin D, Ergocalciferol, (DRISDOL) 50000 UNITS CAPS capsule Take 50,000 Units every 30 (thirty) days by mouth.      Relevant Imaging Results:  Relevant Lab Results:   Additional Information    Shade Flood, LCSW

## 2017-10-16 NOTE — Progress Notes (Signed)
Subjective: Interval History: Patient is feeling much better.  He denies any abdominal pain.  No fever chills or sweating.  Objective: Vital signs in last 24 hours: Temp:  [98 F (36.7 C)-100.9 F (38.3 C)] 98.3 F (36.8 C) (11/09 0600) Pulse Rate:  [86-101] 86 (11/09 0600) Resp:  [18-20] 18 (11/09 0600) BP: (129-188)/(76-105) 148/86 (11/09 0600) SpO2:  [96 %-100 %] 100 % (11/09 0600) Weight:  [70.9 kg (156 lb 4.9 oz)] 70.9 kg (156 lb 4.9 oz) (11/08 0900) Weight change:   Intake/Output from previous day: 11/08 0701 - 11/09 0700 In: 600 [P.O.:600] Out: 2000  Intake/Output this shift: No intake/output data recorded.  General appearance: alert, cooperative and no distress Resp: clear to auscultation bilaterally Cardio: regular rate and rhythm Extremities: No edema  Lab Results: Recent Labs    10/15/17 0414 10/16/17 0509  WBC 14.8* 8.2  HGB 10.2* 10.1*  HCT 30.6* 30.2*  PLT 95* 79*   BMET:  Recent Labs    10/15/17 0414 10/16/17 0509  NA 130* 129*  K 4.1 3.9  CL 88* 89*  CO2 28 28  GLUCOSE 86 116*  BUN 59* 38*  CREATININE 9.65* 7.54*  CALCIUM 9.0 9.0   No results for input([name]): PTH in the last 72 hours. Iron Studies: No results for input([name]): IRON, TIBC, TRANSFERRIN, FERRITIN in the last 72 hours.  Studies/Results: Dg Chest Port 1 View  Result Date: 10/14/2017 CLINICAL DATA:  Fever, end-stage dialysis dependent renal failure. , diabetes, urinary tract infection. EXAM: PORTABLE CHEST 1 VIEW COMPARISON:  None in PACs FINDINGS: The lungs are adequately inflated. The interstitial markings are mildly prominent diffusely. There is no alveolar infiltrate or pleural effusion. The heart is normal in size. The pulmonary vascularity is not engorged. The bony thorax exhibits no acute abnormality. IMPRESSION: Mild interstitial prominence bilaterally may be acute or chronic. No alveolar pneumonia nor definite pulmonary edema. Electronically Signed   By: David  Martinique M.D.   On:  10/14/2017 12:32    I have reviewed the patient'[name] current medications.  Assessment/Plan: Problem #1 UTI: Presently patient is on antibiotics.  He is feeling much better.  He remains afebrile and his potassium is normal. Problem #2 end-stage renal disease: His status post hemodialysis yesterday.  He does not have any uremic signs and symptoms. Problem #3 bone and mineral disorder: His calcium and phosphorus is range Problem #4 hypertension: His blood pressure is somewhat high but stable Problem #5 anemia: His hemoglobin is within our target range Problem #6 fluid management: No sign of fluid overload. Problem #7 history of diabetes: His blood sugar is reasonably controlled Problem #8 history of schizophrenia: Well controlled. Problem #9 history of CVA Plan: 1] patient does not require dialysis today.  His next dialysis will be tomorrow.  If patient is going to be discharged he will go to his regular outpatient dialysis unit.  If patient is still in the hospital will make arrangement for patient to get dialysis tomorrow.    LOS: 3 days   Lawrence [name] 10/16/2017,8:47 AM

## 2017-10-16 NOTE — Care Management Important Message (Signed)
Important Message  Patient Details  Name: Lawrence [name] MRN: [id number] Date of Birth: [birth year]   Medicare Important Message Given:  Yes    Sherald Barge, RN 10/16/2017, 10:29 AM

## 2017-10-16 NOTE — Care Management Note (Signed)
Case Management Note  Patient Details  Name: Lawrence [name] MRN: [id number] Date of Birth: 1956/01/27  Subjective/Objective:         Admitted with UTI from group home, ind with ADL;s. Has PCP, transportation and insurance with drug coverage. On HD at [facility].            Action/Plan: DC back to group home today. CSW to make arrangements. No HH or DME needs noted at DC.  Expected Discharge Date:  10/16/17               Expected Discharge Plan:  Group Home  In-House Referral:  Clinical Social Work  Discharge planning Services  NA  Post Acute Care Choice:  NA Choice offered to:  NA  Status of Service:  Completed, signed off  Sherald Barge, RN 10/16/2017, 10:31 AM

## 2017-10-16 NOTE — Clinical Social Work Note (Signed)
Pt stable for dc today. Spoke with pt who expresses that he is pleased that he gets to go home today. Spoke with group home manager by phone and she will be providing transportation to pt upon dc. DC information faxed to the group home through [facility]. Updated RN. No other LCSW needs for dc.

## 2017-10-16 NOTE — Discharge Planning (Signed)
Patient IV and tele removed.  RN assessment and VS revealed stability for returning to group home.  Discharge papers given, explained and educated. Informed of suggested FU appts and appts made.  Script e-scribed to [employer]. Once ready, will be wheeld to front and Group home manager will be transporting back to [facility] via car. -Awaiting arrival (between 12-1PM).

## 2017-10-16 NOTE — Discharge Summary (Signed)
Physician Discharge Summary  Lawrence [name] JYN:[id number] DOB: 13-Feb-1956 DOA: 10/13/2017  PCP: Sharyne Peach, MD  Admit date: 10/13/2017 Discharge date: 10/16/2017  Admitted From: Group Home  Disposition: Group Home   Recommendations for Outpatient Follow-up:  1. Follow up with PCP in 1-2 weeks 2. Please give antibiotic AFTER dialysis on dialysis days only until all 5 tabs completed  Discharge Condition: STABLE   CODE STATUS: FULL    Brief Hospitalization Summary: Please see all hospital notes, images, labs for full details of the hospitalization.  HPI: Lawrence [name] is a 61 y.o. male with medical history significant of anemia, cerebral hemorrhage, chronic constipation, chronic kidney disease, type 2 diabetes, ESRD on hemodialysis, hyperlipidemia, hypertension, schizoaffective schizophrenia who is coming to the emergency department due to progressively worse right flank pain associated with dysuria and hematuria since this morning while having hemodialysis. He denies testicular tenderness or edema, fever, but complains of chills and fatigue.  No chest pain, dyspnea, palpitations, dizziness, abdominal pain, nausea, emesis, diarrhea, melena, hematochezia.  He states that his blood glucose has been under control, denies blurred vision.  ED Course: Initial vital signs show a temperature 98.88F, pulse of 86, respirations 18, blood pressure 196/100 mmHg and O2 sat 99%.  He received KCl 10 mEq p.o. x1 and ceftriaxone 1 g IVPB  Workup shows urine analysis with pyuria, bacteriuria, positive nitrites, mild proteinuria and trace ketonuria. An urine sample was sent to the lab for culture and sensitivity.  His CBC shows WBC of 9.9 with 76% neutrophils, hemoglobin 11.2 g/dL and platelets 106.  Sodium is 134, potassium 2.9, chloride 89 and bicarbonate 33 mmol/L.   Imaging: CT renal protocol shows right periureteral inflammatory changes without evidence of obstructive uropathy. This may represent  inflammatory changes post passage of ureteral stone, ascending infectious changes or transitional cell malignancy. Multicystic appearance of the kidneys with suboptimal evaluation of the bilateral renal hypoattenuated lesions due to lack of IV contrast. Prostate gland enlargement. Please correlate to patient's serum PSA values.  Please see images and full radiology report for further detail.  Brief Admission Hx: Lawrence [name] a 61 y.o.malewith medical history significant ofanemia, cerebral hemorrhage, chronic constipation, chronic kidney disease, type 2 diabetes, ESRD on hemodialysis, hyperlipidemia, hypertension, schizoaffective schizophrenia who is coming to the emergency department due to progressively worse right flank pain associated with dysuria and hematuriasince this morning while having hemodialysis.  He was admitted with UTI.     MDM/Assessment & Plan:   1. UTI - admitted for IV antibiotics treatment and to follow urine culture and blood culture.  Clinically patient is reporting good improvement, DC home today. Urine culture positive for E coli sensitive to ciprofloxacin. Will have patient take cipro 500 mg once daily on dialysis days only after HD treatment completed for 10 day course.  BC no growth to date.  2. Leukocytosis - trended down to normal now.  suspect secondary to UTI, following.  3. ESRD on HD - consulted nephrology team. He has HD on Tue, Thu, Sat.  He had HD 11/8, resume regular HD schedule tomorrow.   4. Essential Hypertension - resume home BP meds and follow.  5. Schizophrenia - resume home psychiatric medications.  6. Type 2 DM - DC glipizide in hospital, resume regular home meds at discharge.  7. Hyperlipidemia - Resume home meds.  8. Cerebrovascular Disease with history of CVA - Continue aspirin for secondary prevention. 9. Hypokalemia - follow and replete as needed. Check magnesium.  10. Anemia of CKD -  Following.   11. Thrombocytopenia - stable, following.    DVT prophylaxis: heparin Code Status: Full  Family Communication: none present Disposition Plan: return to group home    Consultants:  nephrology  Discharge Diagnoses:  Principal Problem:   Acute urinary tract infection Active Problems:   ESRD on dialysis ([place])   HTN (hypertension)   Chronic schizoaffective schizophrenia ([place])   Type 2 diabetes mellitus ([place])   HLD (hyperlipidemia)   History of CVA (cerebrovascular accident)   Hypokalemia   Anemia in ESRD (end-stage renal disease) ([place])   Thrombocytopenia ([facility])  Discharge Instructions: Discharge Instructions    Call MD for:  difficulty breathing, headache or visual disturbances   Complete by:  As directed    Call MD for:  extreme fatigue   Complete by:  As directed    Call MD for:  hives   Complete by:  As directed    Call MD for:  persistant dizziness or light-headedness   Complete by:  As directed    Call MD for:  persistant nausea and vomiting   Complete by:  As directed    Call MD for:  severe uncontrolled pain   Complete by:  As directed    Increase activity slowly   Complete by:  As directed      Allergies as of 10/16/2017      Reactions   Nsaids Other (See Comments)   Cannot take due to renal disease      Medication List    TAKE these medications   amLODipine 2.5 MG tablet Commonly known as:  NORVASC Take 2.5 mg 2 (two) times daily by mouth.   aspirin EC 81 MG tablet Take 81 mg by mouth daily.   benztropine 0.5 MG tablet Commonly known as:  COGENTIN Take 0.5 mg by mouth 2 (two) times daily.   carvedilol 25 MG tablet Commonly known as:  COREG Take 25 mg by mouth at bedtime.   cetirizine 10 MG tablet Commonly known as:  ZYRTEC Take 10 mg by mouth at bedtime.   ciprofloxacin 500 MG tablet Commonly known as:  CIPRO Take 1 tablet (500 mg total) Every Tuesday,Thursday,and Saturday with dialysis for 10 days by mouth. TAKE AFTER DIALYSIS Start taking on:  10/17/2017   cloNIDine 0.3 MG  tablet Commonly known as:  CATAPRES Take 0.3 mg by mouth 2 (two) times daily.   Coenzyme Q10 100 MG Tabs Take 100 mg by mouth at bedtime.   docusate sodium 100 MG capsule Commonly known as:  COLACE Take 100 mg by mouth 2 (two) times daily.   fluticasone 50 MCG/ACT nasal spray Commonly known as:  FLONASE Place 2 sprays daily as needed into both nostrils for allergies or rhinitis.   glipiZIDE 5 MG 24 hr tablet Commonly known as:  GLUCOTROL XL Take 5 mg by mouth daily with breakfast.   haloperidol 5 MG tablet Commonly known as:  HALDOL Take 7.5 mg by mouth at bedtime. At bedtime   isosorbide mononitrate 60 MG 24 hr tablet Commonly known as:  IMDUR Take 90 mg by mouth daily.   lactulose 10 GM/15ML solution Commonly known as:  CHRONULAC Take 10 g by mouth daily.   lidocaine-prilocaine cream Commonly known as:  EMLA Apply 1 application topically 3 (three) times a week. Apply over dialysis shunt 30 minutes prior to dialysis.   losartan 100 MG tablet Commonly known as:  COZAAR Take 1 tablet (100 mg total) by mouth daily.   MUCINEX 600 MG 12 hr  tablet Generic drug:  guaiFENesin Take 600 mg 2 (two) times daily as needed by mouth (congestion).   multivitamin with minerals tablet Take 1 tablet by mouth daily.   niacin 1000 MG CR tablet Commonly known as:  NIASPAN Take 1,000 mg by mouth at bedtime.   pravastatin 40 MG tablet Commonly known as:  PRAVACHOL Take 40 mg by mouth at bedtime.   Vitamin D (Ergocalciferol) 50000 units Caps capsule Commonly known as:  DRISDOL Take 50,000 Units every 30 (thirty) days by mouth.      Follow-up Information    Sharyne Peach, MD. Schedule an appointment as soon as possible for a visit in 2 week(s).   Specialty:  Family Medicine Contact information: [place] [address] [phone]        Hemodialysis. Go in 1 day(s).   Why:  Go as regularly scheduled         Allergies  Allergen Reactions  .  Nsaids Other (See Comments)    Cannot take due to renal disease   Current Discharge Medication List    START taking these medications   Details  ciprofloxacin (CIPRO) 500 MG tablet Take 1 tablet (500 mg total) Every Tuesday,Thursday,and Saturday with dialysis for 10 days by mouth. TAKE AFTER DIALYSIS Qty: 5 tablet, Refills: 0      CONTINUE these medications which have NOT CHANGED   Details  amLODipine (NORVASC) 2.5 MG tablet Take 2.5 mg 2 (two) times daily by mouth.    aspirin EC 81 MG tablet Take 81 mg by mouth daily.    benztropine (COGENTIN) 0.5 MG tablet Take 0.5 mg by mouth 2 (two) times daily.    carvedilol (COREG) 25 MG tablet Take 25 mg by mouth at bedtime.     cetirizine (ZYRTEC) 10 MG tablet Take 10 mg by mouth at bedtime.     cloNIDine (CATAPRES) 0.3 MG tablet Take 0.3 mg by mouth 2 (two) times daily.    Coenzyme Q10 100 MG TABS Take 100 mg by mouth at bedtime.    docusate sodium (COLACE) 100 MG capsule Take 100 mg by mouth 2 (two) times daily.    fluticasone (FLONASE) 50 MCG/ACT nasal spray Place 2 sprays daily as needed into both nostrils for allergies or rhinitis.    glipiZIDE (GLUCOTROL XL) 5 MG 24 hr tablet Take 5 mg by mouth daily with breakfast.    guaiFENesin (MUCINEX) 600 MG 12 hr tablet Take 600 mg 2 (two) times daily as needed by mouth (congestion).    haloperidol (HALDOL) 5 MG tablet Take 7.5 mg by mouth at bedtime. At bedtime     isosorbide mononitrate (IMDUR) 60 MG 24 hr tablet Take 90 mg by mouth daily.    lactulose (CHRONULAC) 10 GM/15ML solution Take 10 g by mouth daily.    lidocaine-prilocaine (EMLA) cream Apply 1 application topically 3 (three) times a week. Apply over dialysis shunt 30 minutes prior to dialysis.    losartan (COZAAR) 100 MG tablet Take 1 tablet (100 mg total) by mouth daily. Qty: 30 tablet, Refills: 0    Multiple Vitamins-Minerals (MULTIVITAMIN WITH MINERALS) tablet Take 1 tablet by mouth daily.    niacin (NIASPAN) 1000 MG  CR tablet Take 1,000 mg by mouth at bedtime.    pravastatin (PRAVACHOL) 40 MG tablet Take 40 mg by mouth at bedtime.    Vitamin D, Ergocalciferol, (DRISDOL) 50000 UNITS CAPS capsule Take 50,000 Units every 30 (thirty) days by mouth.  Procedures/Studies: Dg Chest Port 1 View  Result Date: 10/14/2017 CLINICAL DATA:  Fever, end-stage dialysis dependent renal failure. , diabetes, urinary tract infection. EXAM: PORTABLE CHEST 1 VIEW COMPARISON:  None in PACs FINDINGS: The lungs are adequately inflated. The interstitial markings are mildly prominent diffusely. There is no alveolar infiltrate or pleural effusion. The heart is normal in size. The pulmonary vascularity is not engorged. The bony thorax exhibits no acute abnormality. IMPRESSION: Mild interstitial prominence bilaterally may be acute or chronic. No alveolar pneumonia nor definite pulmonary edema. Electronically Signed   By: David  Martinique M.D.   On: 10/14/2017 12:32   Ct Renal Stone Study  Result Date: 10/13/2017 CLINICAL DATA:  Right flank pain which is worsening. EXAM: CT ABDOMEN AND PELVIS WITHOUT CONTRAST TECHNIQUE: Multidetector CT imaging of the abdomen and pelvis was performed following the standard protocol without IV contrast. COMPARISON:  None. FINDINGS: Lower chest: No acute abnormality. Hepatobiliary: 6.3 cm benign-appearing cyst in the dome of the liver. Normal appearance of the gallbladder. Pancreas: Unremarkable. No pancreatic ductal dilatation or surrounding inflammatory changes. Spleen: Normal in size without focal abnormality. Adrenals/Urinary Tract: Bilateral cortical thinning with multicystic appearance of the kidneys. The hypoattenuated bilateral renal lesions are incompletely evaluated due to lack of IV contrast. Right periureteral inflammatory changes at the level of the mid right ureter without significant dilation, image [id number], sequence 2, axial images. No radiopaque obstructive calculus is seen. The urinary  bladder is minimally distended but normal for its state of distension. Stomach/Bowel: Stomach is within normal limits. No evidence of appendicitis. No evidence of bowel wall thickening, distention, or inflammatory changes. Vascular/Lymphatic: No significant vascular findings are present. No enlarged abdominal or pelvic lymph nodes. Reproductive: Enlarged prostate gland measuring 5.3 cm. Other: No abdominal wall hernia or abnormality. No abdominopelvic ascites. 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 IMPRESSION: Right periureteral inflammatory changes without evidence of obstructive uropathy. This may represent inflammatory changes post passage of ureteral stone, ascending infectious changes or transitional cell malignancy. Multicystic appearance of the kidneys with suboptimal evaluation of the bilateral renal hypoattenuated lesions due to lack of IV contrast. Prostate gland enlargement. Please correlate to patient's serum PSA values. Electronically Signed   By: Fidela Salisbury M.D.   On: 10/13/2017 18:11      Subjective: Pt awake alert, talking on phone, says he feels much better.    Discharge Exam: Vitals:   10/15/17 2130 10/16/17 0600  BP: 140/83 (!) 148/86  Pulse: 95 86  Resp: 20 18  Temp: (!) 100.9 F (38.3 C) 98.3 F (36.8 C)  SpO2: 100% 100%   Vitals:   10/15/17 1435 10/15/17 1936 10/15/17 2130 10/16/17 0600  BP: 129/76 (!) 144/78 140/83 (!) 148/86  Pulse: 95 98 95 86  Resp: 20 20 20 18   Temp: 98 F (36.7 C) 99.6 F (37.6 C) (!) 100.9 F (38.3 C) 98.3 F (36.8 C)  TempSrc: Oral Oral Oral Oral  SpO2: 98% 100% 100% 100%  Weight:      Height:       General: Pt is thin, chronically ill appearing, alert, awake, not in acute distress Cardiovascular: normal S1/S2 +, no rubs, no gallops Respiratory: CTA bilaterally, no wheezing, no rhonchi Abdominal: Soft, NT, ND, bowel sounds + Extremities: no edema, no cyanosis    The results of significant diagnostics from this hospitalization (including imaging, microbiology, ancillary and laboratory) are listed below for reference.     Microbiology: Recent Results (from the past 240 hour(s))  Urine culture     Status: Abnormal   Collection Time: 10/13/17  5:03 PM  Result Value Ref Range Status   Specimen Description URINE, RANDOM  Final   Special Requests NONE  Final   Culture >=100,000 COLONIES/mL ESCHERICHIA COLI (A)  Final   Report Status 10/16/2017 FINAL  Final   Organism ID, Bacteria ESCHERICHIA COLI (A)  Final      Susceptibility   Escherichia coli - MIC*    AMPICILLIN >=32 RESISTANT Resistant     CEFAZOLIN <=4 SENSITIVE Sensitive     CEFTRIAXONE <=1 SENSITIVE Sensitive     CIPROFLOXACIN <=0.25 SENSITIVE Sensitive     GENTAMICIN <=1 SENSITIVE Sensitive     IMIPENEM <=0.25 SENSITIVE Sensitive     NITROFURANTOIN <=16 SENSITIVE Sensitive     TRIMETH/SULFA <=20 SENSITIVE Sensitive     AMPICILLIN/SULBACTAM 8 SENSITIVE Sensitive     PIP/TAZO <=4 SENSITIVE Sensitive     Extended ESBL NEGATIVE Sensitive     * >=100,000 COLONIES/mL ESCHERICHIA COLI  MRSA PCR Screening     Status: None   Collection Time: 10/13/17 11:19 PM  Result Value Ref Range Status   MRSA by PCR NEGATIVE NEGATIVE Final    Comment:        The GeneXpert MRSA Assay (FDA approved for NASAL specimens only), is one component of a comprehensive MRSA colonization surveillance program. It is not intended to diagnose MRSA infection nor to guide or monitor treatment for MRSA infections.   Culture, blood (Routine X 2) w Reflex to ID Panel     Status: None (Preliminary result)   Collection Time: 10/14/17 12:37 PM  Result Value Ref Range Status   Specimen Description RIGHT ANTECUBITAL  Final   Special Requests   Final    BOTTLES DRAWN AEROBIC AND ANAEROBIC Blood Culture adequate volume   Culture NO GROWTH 2 DAYS  Final   Report Status PENDING  Incomplete  Culture, blood (Routine X 2) w  Reflex to ID Panel     Status: None (Preliminary result)   Collection Time: 10/14/17 12:50 PM  Result Value Ref Range Status   Specimen Description BLOOD RIGHT ARM  Final   Special Requests   Final    BOTTLES DRAWN AEROBIC AND ANAEROBIC Blood Culture adequate volume   Culture NO GROWTH 2 DAYS  Final   Report Status PENDING  Incomplete     Labs: BNP (last 3 results) No results for input(s): BNP in the last 8760 hours. Basic Metabolic Panel: Recent Labs  Lab 10/13/17 1738 10/14/17 0600 10/15/17 0414 10/16/17 0509  NA 134* 131* 130* 129*  K 2.9* 3.7 4.1 3.9  CL 89* 87* 88* 89*  CO2 33* 33* 28 28  GLUCOSE 97 93 86 116*  BUN 22* 34* 59* 38*  CREATININE 5.39* 7.00* 9.65* 7.54*  CALCIUM 9.1 9.2 9.0 9.0  PHOS  --   --  2.5 3.4   Liver Function Tests: Recent Labs  Lab 10/14/17 0600 10/15/17 0414 10/16/17 0509  AST 16  --   --   ALT 18  --   --   ALKPHOS 76  --   --   BILITOT 0.7  --   --   PROT 7.1  --   --   ALBUMIN 3.6 3.2* 3.1*   No results for input(s): LIPASE, AMYLASE in the last 168 hours. No results for input(s): AMMONIA in the last 168 hours. CBC: Recent Labs  Lab 10/13/17 1738 10/14/17 0600 10/15/17 0414 10/16/17 0509  WBC  9.8 12.6* 14.8* 8.2  NEUTROABS 7.4 10.9*  --   --   HGB 11.2* 11.4* 10.2* 10.1*  HCT 33.5* 34.9* 30.6* 30.2*  MCV 93.6 94.6 94.2 94.7  PLT 106* 106* 95* 79*   Cardiac Enzymes: No results for input(s): CKTOTAL, CKMB, CKMBINDEX, TROPONINI in the last 168 hours. BNP: Invalid input(s): POCBNP CBG: Recent Labs  Lab 10/15/17 1132 10/15/17 1620 10/15/17 1910 10/15/17 2134 10/16/17 0724  GLUCAP 127* 188* 225* 165* 120*   D-Dimer No results for input(s): DDIMER in the last 72 hours. Hgb A1c No results for input(s): HGBA1C in the last 72 hours. Lipid Profile No results for input(s): CHOL, HDL, LDLCALC, TRIG, CHOLHDL, LDLDIRECT in the last 72 hours. Thyroid function studies No results for input(s): TSH, T4TOTAL, T3FREE, THYROIDAB  in the last 72 hours.  Invalid input(s): FREET3 Anemia work up No results for input(s): VITAMINB12, FOLATE, FERRITIN, TIBC, IRON, RETICCTPCT in the last 72 hours. Urinalysis    Component Value Date/Time   COLORURINE BROWN (A) 10/13/2017 1703   APPEARANCEUR HAZY (A) 10/13/2017 1703   APPEARANCEUR Clear 12/13/2014 1354   LABSPEC 1.020 10/13/2017 1703   LABSPEC 1.006 12/13/2014 1354   PHURINE 8.5 (H) 10/13/2017 1703   GLUCOSEU 250 (A) 10/13/2017 1703   GLUCOSEU 50 mg/dL 12/13/2014 1354   HGBUR LARGE (A) 10/13/2017 1703   BILIRUBINUR MODERATE (A) 10/13/2017 1703   BILIRUBINUR Negative 12/13/2014 1354   KETONESUR TRACE (A) 10/13/2017 1703   PROTEINUR >300 (A) 10/13/2017 1703   NITRITE POSITIVE (A) 10/13/2017 1703   LEUKOCYTESUR LARGE (A) 10/13/2017 1703   LEUKOCYTESUR Negative 12/13/2014 1354   Sepsis Labs Invalid input(s): PROCALCITONIN,  WBC,  LACTICIDVEN Microbiology Recent Results (from the past 240 hour(s))  Urine culture     Status: Abnormal   Collection Time: 10/13/17  5:03 PM  Result Value Ref Range Status   Specimen Description URINE, RANDOM  Final   Special Requests NONE  Final   Culture >=100,000 COLONIES/mL ESCHERICHIA COLI (A)  Final   Report Status 10/16/2017 FINAL  Final   Organism ID, Bacteria ESCHERICHIA COLI (A)  Final      Susceptibility   Escherichia coli - MIC*    AMPICILLIN >=32 RESISTANT Resistant     CEFAZOLIN <=4 SENSITIVE Sensitive     CEFTRIAXONE <=1 SENSITIVE Sensitive     CIPROFLOXACIN <=0.25 SENSITIVE Sensitive     GENTAMICIN <=1 SENSITIVE Sensitive     IMIPENEM <=0.25 SENSITIVE Sensitive     NITROFURANTOIN <=16 SENSITIVE Sensitive     TRIMETH/SULFA <=20 SENSITIVE Sensitive     AMPICILLIN/SULBACTAM 8 SENSITIVE Sensitive     PIP/TAZO <=4 SENSITIVE Sensitive     Extended ESBL NEGATIVE Sensitive     * >=100,000 COLONIES/mL ESCHERICHIA COLI  MRSA PCR Screening     Status: None   Collection Time: 10/13/17 11:19 PM  Result Value Ref Range Status    MRSA by PCR NEGATIVE NEGATIVE Final    Comment:        The GeneXpert MRSA Assay (FDA approved for NASAL specimens only), is one component of a comprehensive MRSA colonization surveillance program. It is not intended to diagnose MRSA infection nor to guide or monitor treatment for MRSA infections.   Culture, blood (Routine X 2) w Reflex to ID Panel     Status: None (Preliminary result)   Collection Time: 10/14/17 12:37 PM  Result Value Ref Range Status   Specimen Description RIGHT ANTECUBITAL  Final   Special Requests   Final  BOTTLES DRAWN AEROBIC AND ANAEROBIC Blood Culture adequate volume   Culture NO GROWTH 2 DAYS  Final   Report Status PENDING  Incomplete  Culture, blood (Routine X 2) w Reflex to ID Panel     Status: None (Preliminary result)   Collection Time: 10/14/17 12:50 PM  Result Value Ref Range Status   Specimen Description BLOOD RIGHT ARM  Final   Special Requests   Final    BOTTLES DRAWN AEROBIC AND ANAEROBIC Blood Culture adequate volume   Culture NO GROWTH 2 DAYS  Final   Report Status PENDING  Incomplete    Time coordinating discharge: 36 mins  SIGNED:  Irwin Brakeman, MD  [facility] 10/16/2017, 9:53 AM Pager [phone]  If 7PM-7AM, please contact night-coverage [email or web address] Password [email or web address]

## 2017-10-19 LAB — CULTURE, BLOOD (ROUTINE X 2)
CULTURE: NO GROWTH
Culture: NO GROWTH
SPECIAL REQUESTS: ADEQUATE
Special Requests: ADEQUATE

## 2018-04-30 ENCOUNTER — Ambulatory Visit: Payer: Medicare Other | Admitting: Urology

## 2018-09-28 ENCOUNTER — Other Ambulatory Visit (INDEPENDENT_AMBULATORY_CARE_PROVIDER_SITE_OTHER): Payer: Self-pay | Admitting: Vascular Surgery

## 2018-09-28 DIAGNOSIS — N186 End stage renal disease: Secondary | ICD-10-CM

## 2018-09-29 ENCOUNTER — Ambulatory Visit (INDEPENDENT_AMBULATORY_CARE_PROVIDER_SITE_OTHER): Payer: Medicare Other | Admitting: Vascular Surgery

## 2018-09-29 ENCOUNTER — Ambulatory Visit (INDEPENDENT_AMBULATORY_CARE_PROVIDER_SITE_OTHER): Payer: Medicare Other

## 2018-09-29 ENCOUNTER — Encounter (INDEPENDENT_AMBULATORY_CARE_PROVIDER_SITE_OTHER): Payer: Self-pay

## 2018-09-29 DIAGNOSIS — N186 End stage renal disease: Secondary | ICD-10-CM | POA: Diagnosis not present

## 2018-09-30 ENCOUNTER — Telehealth (INDEPENDENT_AMBULATORY_CARE_PROVIDER_SITE_OTHER): Payer: Self-pay | Admitting: Vascular Surgery

## 2018-09-30 NOTE — Telephone Encounter (Signed)
Patient underwent a left upper extremity HDA to assess his left brachiocephalic AV fistula yesterday.  The patient's total flow volume is 1614.  The outflow vein is notable not to have any areas of significant stenosis.  Recommended six-month follow-up for surveillance HDA. The patient was instructed to call the office in the interim if any issues with dialysis access / doppler flow, pain, edema, pallor, fistula skin breakdown or ulceration of the arm / hand occur.

## 2018-10-25 IMAGING — CT CT RENAL STONE PROTOCOL
2 of 4 series · 15 of 46 positions shown, 17 images · non-contrast
Comparison: None.

CLINICAL DATA: Right flank pain which is worsening.

EXAM:
CT ABDOMEN AND PELVIS WITHOUT CONTRAST
TECHNIQUE: Multidetector CT imaging of the abdomen and pelvis was performed
following the standard protocol without IV contrast.

[Series 2: axial st · axial · 0.69mm/px · z∈[+994,+1378]mm · 12 of 88 slices shown, 14 images]
[im 7/88  soft-tissue]
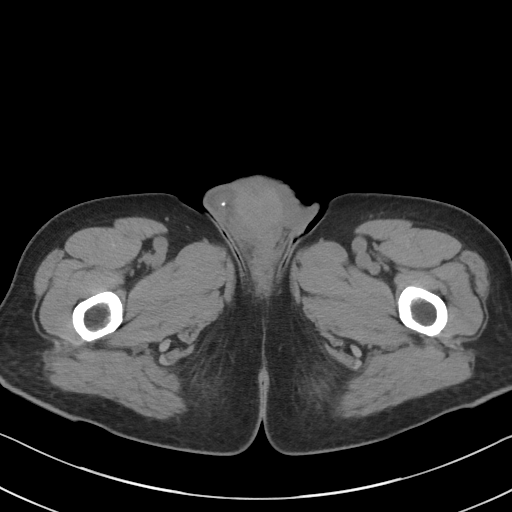
[im 7/88  bone]
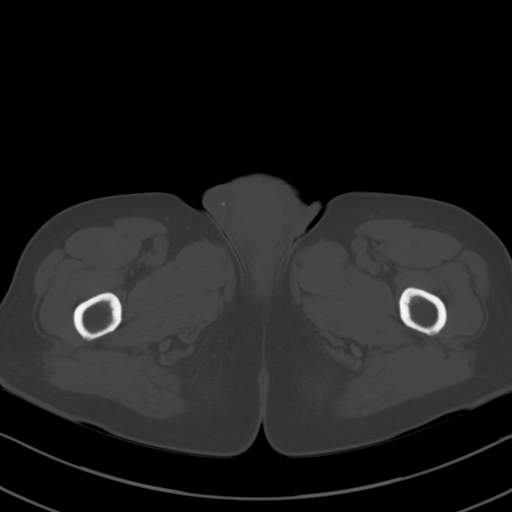
[im 14/88  soft-tissue]
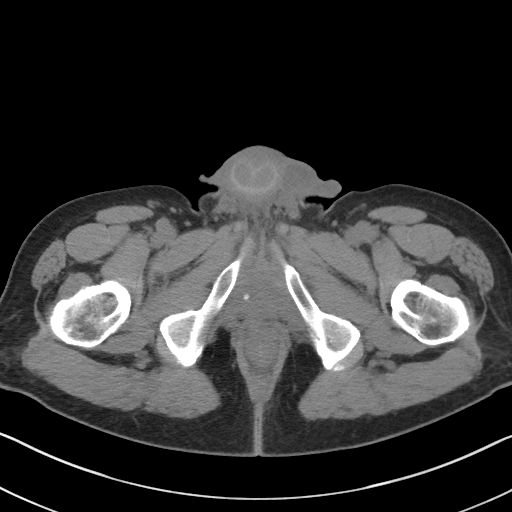
[im 21/88  soft-tissue]
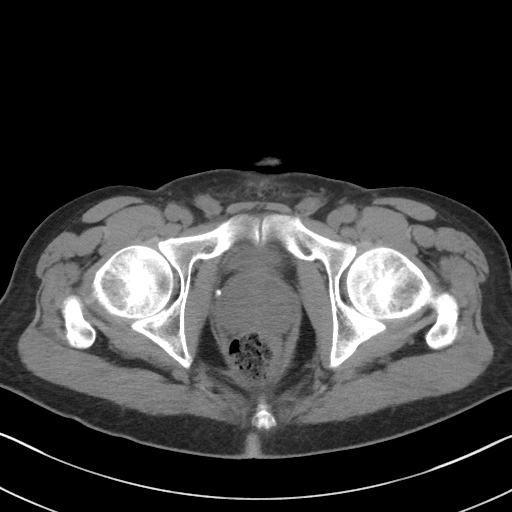
[im 28/88  soft-tissue]
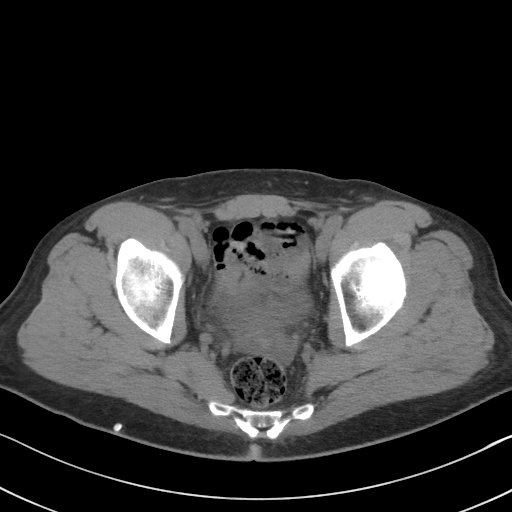
[im 35/88  soft-tissue]
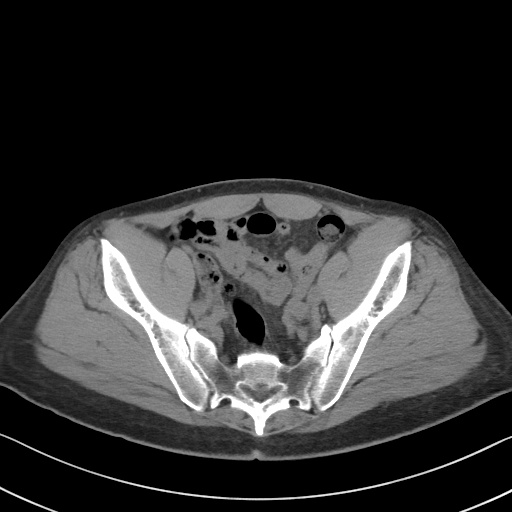
[im 42/88  soft-tissue]
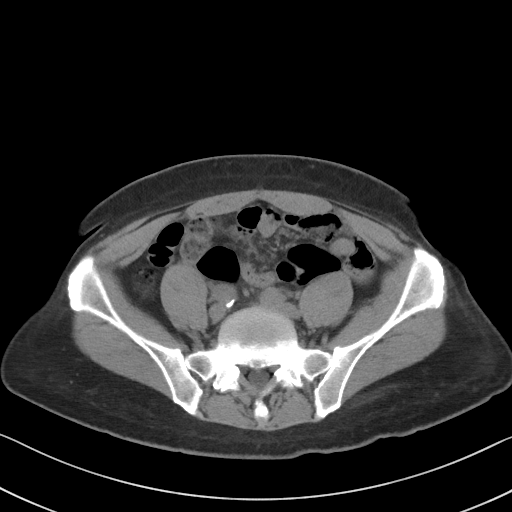
[im 49/88  soft-tissue]
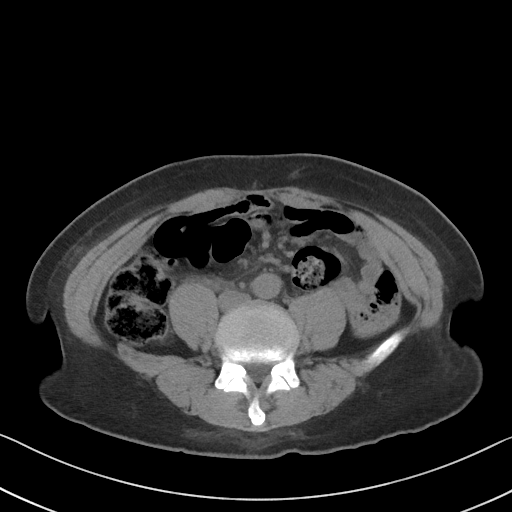
[im 56/88  soft-tissue]
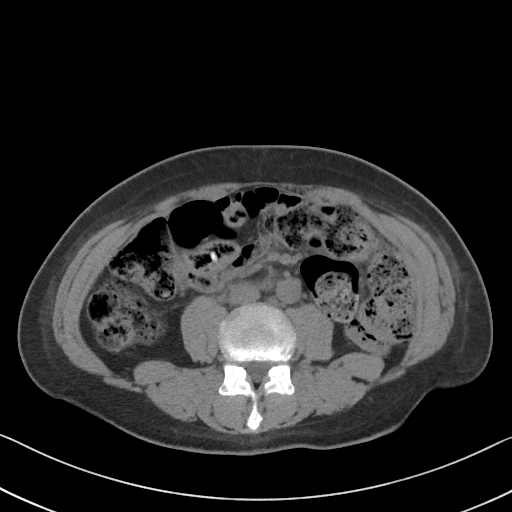
[im 63/88  soft-tissue]
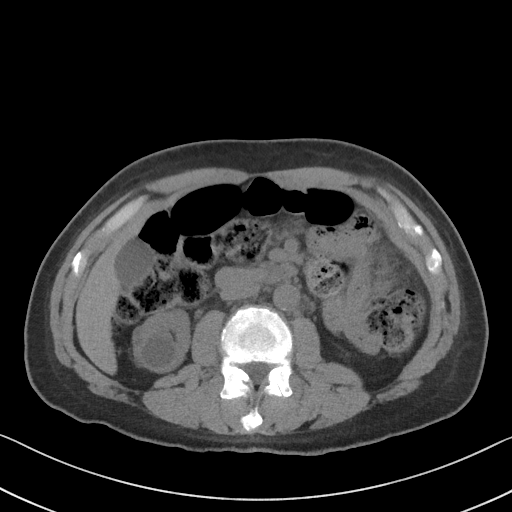
[im 63/88  bone]
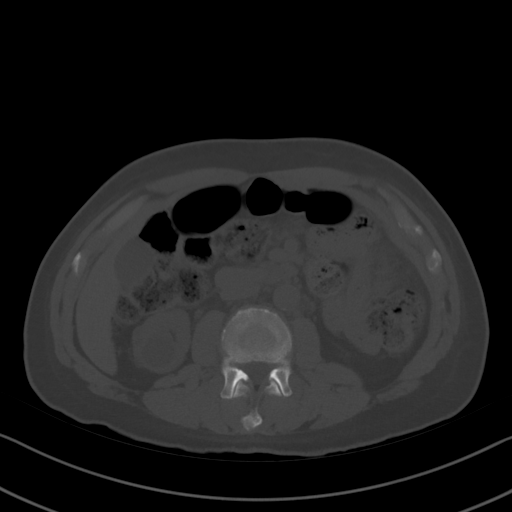
[im 70/88  soft-tissue]
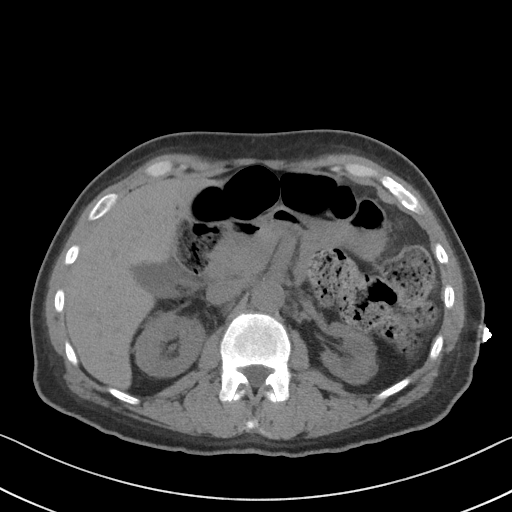
[im 77/88  soft-tissue]
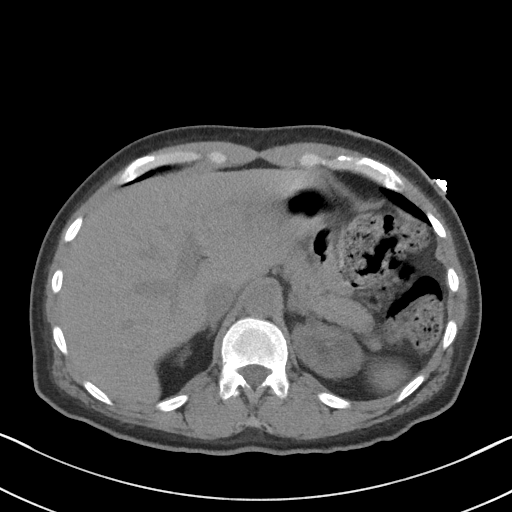
[im 84/88  soft-tissue]
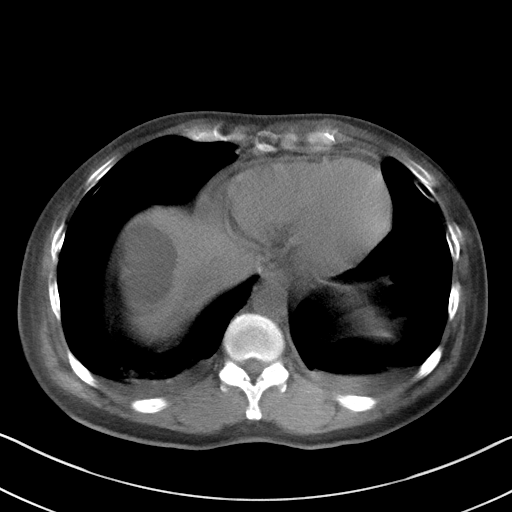

[Series 5: coronal st · coronal · 0.63mm/px · 3 of 77 slices shown]
[im 26/77  soft-tissue]
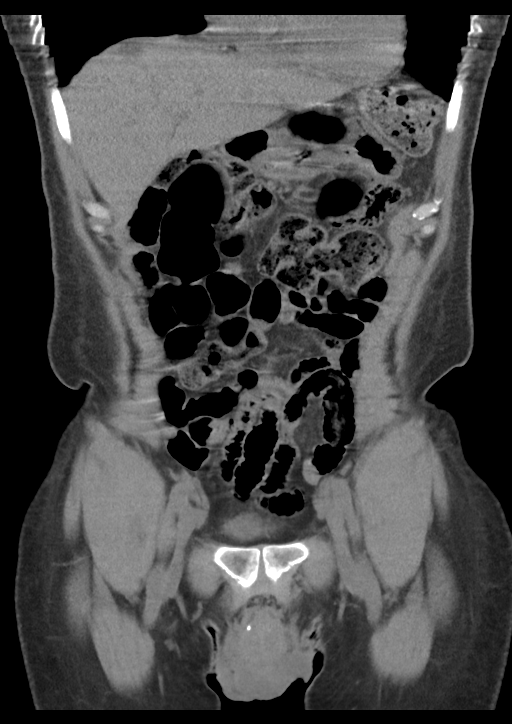
[im 34/77  soft-tissue]
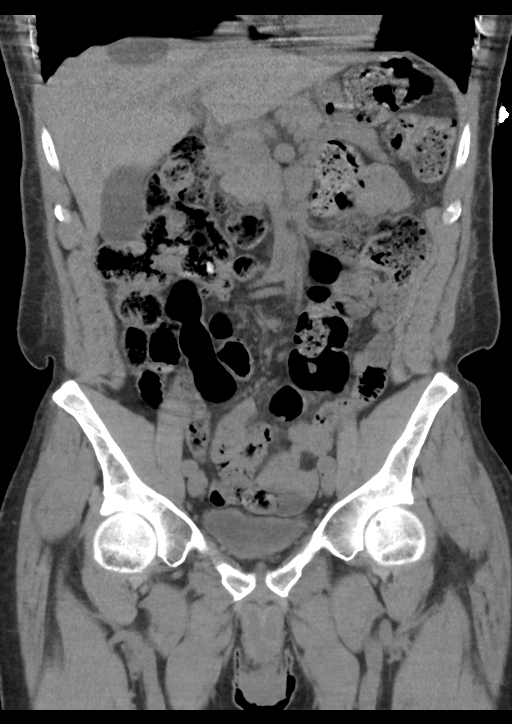
[im 43/77  soft-tissue]
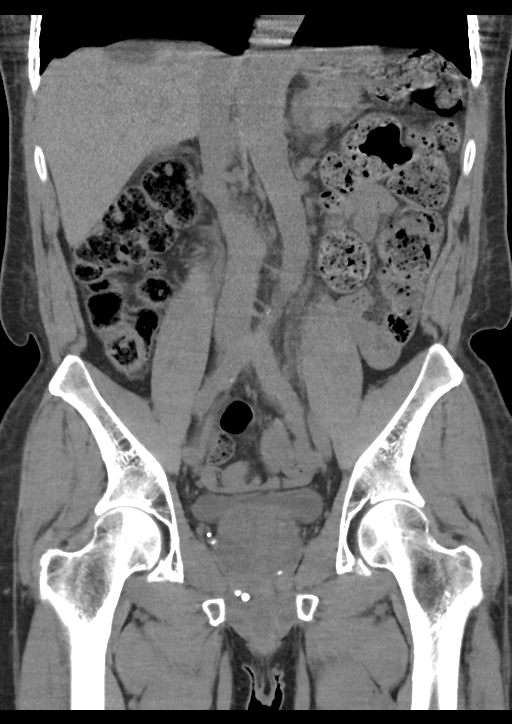

[15 of 46 positions shown; findings below may reference images not displayed]

FINDINGS: Lower chest: No acute abnormality.

Hepatobiliary: 6.3 cm benign-appearing cyst in the dome of the
liver. Normal appearance of the gallbladder.

Pancreas: Unremarkable. No pancreatic ductal dilatation or
surrounding inflammatory changes.

Spleen: Normal in size without focal abnormality.

Adrenals/Urinary Tract: Bilateral cortical thinning with multicystic
appearance of the kidneys. The hypoattenuated bilateral renal
lesions are incompletely evaluated due to lack of IV contrast. Right
periureteral inflammatory changes at the level of the mid right
ureter without significant dilation, image [id number]/[id number], sequence 2,
axial images. No radiopaque obstructive calculus is seen. The
urinary bladder is minimally distended but normal for its state of
distension.

Stomach/Bowel: Stomach is within normal limits. No evidence of
appendicitis. No evidence of bowel wall thickening, distention, or
inflammatory changes.

Vascular/Lymphatic: No significant vascular findings are present. No
enlarged abdominal or pelvic lymph nodes.

Reproductive: Enlarged prostate gland measuring 5.3 cm.

Other: No abdominal wall hernia or abnormality. No abdominopelvic
ascites.

Musculoskeletal: Hyperdense appearance of the osseous structures
consistent with renal osteodystrophy.
IMPRESSION: Right periureteral inflammatory changes without evidence of
obstructive uropathy. This may represent inflammatory changes post
passage of ureteral stone, ascending infectious changes or
transitional cell malignancy.

Multicystic appearance of the kidneys with suboptimal evaluation of
the bilateral renal hypoattenuated lesions due to lack of IV
contrast.

Prostate gland enlargement. Please correlate to patient's serum PSA
values.

## 2018-11-11 ENCOUNTER — Other Ambulatory Visit: Payer: Self-pay

## 2018-11-11 DIAGNOSIS — R1030 Lower abdominal pain, unspecified: Secondary | ICD-10-CM

## 2018-11-11 DIAGNOSIS — R972 Elevated prostate specific antigen [PSA]: Secondary | ICD-10-CM

## 2018-11-12 ENCOUNTER — Other Ambulatory Visit: Payer: Self-pay

## 2018-11-12 ENCOUNTER — Encounter: Payer: Self-pay | Admitting: Urology

## 2018-11-12 ENCOUNTER — Other Ambulatory Visit
Admission: RE | Admit: 2018-11-12 | Discharge: 2018-11-12 | Disposition: A | Discharge status: Home / Self Care | Payer: Medicare Other | Attending: Urology | Admitting: Urology

## 2018-11-12 ENCOUNTER — Ambulatory Visit (INDEPENDENT_AMBULATORY_CARE_PROVIDER_SITE_OTHER): Payer: Medicare Other | Admitting: Urology

## 2018-11-12 VITALS — BP 108/68 | HR 73 | Ht 67.0 in | Wt 155.0 lb

## 2018-11-12 DIAGNOSIS — R972 Elevated prostate specific antigen [PSA]: Secondary | ICD-10-CM

## 2018-11-12 DIAGNOSIS — R34 Anuria and oliguria: Secondary | ICD-10-CM | POA: Diagnosis not present

## 2018-11-12 DIAGNOSIS — R1030 Lower abdominal pain, unspecified: Secondary | ICD-10-CM | POA: Diagnosis not present

## 2018-11-12 DIAGNOSIS — N4889 Other specified disorders of penis: Secondary | ICD-10-CM | POA: Diagnosis not present

## 2018-11-12 LAB — PSA: Prostatic Specific Antigen: 5.18 ng/mL — ABNORMAL HIGH (ref 0.00–4.00)

## 2018-11-12 LAB — BLADDER SCAN AMB NON-IMAGING

## 2018-11-12 NOTE — Progress Notes (Signed)
11/12/2018 4:21 PM   Lawrence [name] [birth year] [id number]  Referring provider: Sharyne Peach, MD [address] [place], [place] [address]  Chief Complaint  Patient presents with  . Elevated PSA    follow up    HPI: 62 year old male with a personal history of elevated PSA who returns today primarily for reassessment of this.  PSA trend as below.  Most recently, he has had another rise up to 6.09 on 10/2018.  He is very concerned today about his decreased urine output and inability to void.  He reports that he does not have the urge to void but is peeing a lot less.  Notably, the patient is on hemodialysis and has had progressive and anuria.  He also endorses a small amount of discomfort at the tip of his penis at times.  No penile discharge.  No fevers or chills.  PSA trend:  2.83 on 04/19/2013  3.2 on 03/05/2016 3.9 on 04/09/16  7.91 on 03/27/2017  4.1 on 05/01/2017 5.15 on 10/07/2017 6.09 on 10/27/2018    PMH: Past Medical History:  Diagnosis Date  . Anemia   . Cerebral hemorrhage ([place]) 2012  . Chronic constipation   . Chronic kidney disease   . Diabetes mellitus without complication ([place])   . Hemodialysis patient ([place])   . Hyperlipidemia   . Hypertension   . Schizophrenia ([place])   . Stroke [facility])     Surgical History: Past Surgical History:  Procedure Laterality Date  . A/V FISTULAGRAM Left 02/24/2017   Procedure: A/V Fistulagram;  Surgeon: Katha Cabal, MD;  Location: [facility] [facility];  Service: Cardiovascular;  Laterality: Left;  . PERIPHERAL VASCULAR CATHETERIZATION N/A 04/17/2015   Procedure: Dialysis/Perma Catheter Insertion;  Surgeon: Katha Cabal, MD;  Location: [facility];  Service: Cardiovascular;  Laterality: N/A;  . PERIPHERAL VASCULAR CATHETERIZATION Left 07/30/2015   Procedure: A/V Shuntogram/Fistulagram;  Surgeon: Algernon Huxley, MD;  Location: [facility];  Service: Cardiovascular;  Laterality: Left;  . PERIPHERAL  VASCULAR CATHETERIZATION Left 07/30/2015   Procedure: A/V Shunt Intervention;  Surgeon: Algernon Huxley, MD;  Location: [facility];  Service: Cardiovascular;  Laterality: Left;  . PERIPHERAL VASCULAR CATHETERIZATION N/A 08/20/2015   Procedure: DIALYSIS/PERMA CATHETER REMOVAL;  Surgeon: Algernon Huxley, MD;  Location: [facility];  Service: Cardiovascular;  Laterality: N/A;  . VASCULAR SURGERY     Fistula placement    Home Medications:  Allergies as of 11/12/2018      Reactions   Nsaids Other (See Comments)   Cannot take due to renal disease      Medication List       Accurate as of November 12, 2018 11:59 PM. Always use your most recent med list.        amLODipine 2.5 MG tablet Commonly known as:  NORVASC Take 2.5 mg 2 (two) times daily by mouth.   aspirin EC 81 MG tablet Take 81 mg by mouth daily.   benztropine 0.5 MG tablet Commonly known as:  COGENTIN Take 0.5 mg by mouth 2 (two) times daily.   carvedilol 25 MG tablet Commonly known as:  COREG Take 25 mg by mouth at bedtime.   cetirizine 10 MG tablet Commonly known as:  ZYRTEC Take 10 mg by mouth at bedtime.   cloNIDine 0.3 MG tablet Commonly known as:  CATAPRES Take 0.3 mg by mouth 2 (two) times daily.   Coenzyme Q10 100 MG Tabs Take 100 mg by  mouth at bedtime.   docusate sodium 100 MG capsule Commonly known as:  COLACE Take 100 mg by mouth 2 (two) times daily.   fluticasone 50 MCG/ACT nasal spray Commonly known as:  FLONASE Place 2 sprays daily as needed into both nostrils for allergies or rhinitis.   glipiZIDE 5 MG 24 hr tablet Commonly known as:  GLUCOTROL XL Take 5 mg by mouth daily with breakfast.   haloperidol 5 MG tablet Commonly known as:  HALDOL Take 7.5 mg by mouth at bedtime. At bedtime   isosorbide mononitrate 60 MG 24 hr tablet Commonly known as:  IMDUR Take 90 mg by mouth daily.   lactulose 10 GM/15ML solution Commonly known as:  CHRONULAC Take 10 g by mouth daily.     lidocaine-prilocaine cream Commonly known as:  EMLA Apply 1 application topically 3 (three) times a week. Apply over dialysis shunt 30 minutes prior to dialysis.   losartan 100 MG tablet Commonly known as:  COZAAR Take 1 tablet (100 mg total) by mouth daily.   MUCINEX 600 MG 12 hr tablet Generic drug:  guaiFENesin Take 600 mg 2 (two) times daily as needed by mouth (congestion).   multivitamin with minerals tablet Take 1 tablet by mouth daily.   niacin 1000 MG CR tablet Commonly known as:  NIASPAN Take 1,000 mg by mouth at bedtime.   pravastatin 40 MG tablet Commonly known as:  PRAVACHOL Take 40 mg by mouth at bedtime.   Vitamin D (Ergocalciferol) 1.25 MG (50000 UT) Caps capsule Commonly known as:  DRISDOL Take 50,000 Units every 30 (thirty) days by mouth.       Allergies:  Allergies  Allergen Reactions  . Nsaids Other (See Comments)    Cannot take due to renal disease    Family History: Family History  Problem Relation Age of Onset  . Diabetes Mother   . Kidney cancer Mother   . Diabetes Sister   . Stroke Brother   . Prostate cancer Neg Hx   . Bladder Cancer Neg Hx     Social History:  reports that he has never smoked. He has never used smokeless tobacco. He reports that he does not drink alcohol or use drugs.  ROS: UROLOGY Frequent Urination?: No Hard to postpone urination?: No Burning/pain with urination?: Yes Get up at night to urinate?: No Leakage of urine?: No Urine stream starts and stops?: No Trouble starting stream?: Yes Do you have to strain to urinate?: No Blood in urine?: No Urinary tract infection?: No Sexually transmitted disease?: No Injury to kidneys or bladder?: No Painful intercourse?: No Weak stream?: No Erection problems?: No Penile pain?: No  Gastrointestinal Nausea?: No Vomiting?: No Indigestion/heartburn?: No Diarrhea?: No Constipation?: No  Constitutional Fever: No Night sweats?: No Weight loss?: No Fatigue?:  No  Skin Skin rash/lesions?: No Itching?: No  Eyes Blurred vision?: No Double vision?: No  Ears/Nose/Throat Sore throat?: No Sinus problems?: No  Hematologic/Lymphatic Swollen glands?: No Easy bruising?: No  Cardiovascular Leg swelling?: Yes Chest pain?: No  Respiratory Cough?: No Shortness of breath?: No  Endocrine Excessive thirst?: No  Musculoskeletal Back pain?: Yes Joint pain?: Yes  Neurological Headaches?: No Dizziness?: No  Psychologic Depression?: No Anxiety?: No  Physical Exam: BP 108/68   Pulse 73   Ht 5\' 7"  (1.702 m)   Wt 155 lb (70.3 kg)   BMI 24.28 kg/m   Constitutional:  Alert and oriented, No acute distress.  Pleasant, accompanied by group home attendant HEENT: [place] AT, moist mucus  membranes.  Trachea midline, no masses. Cardiovascular: No clubbing, cyanosis, or edema. Respiratory: Normal respiratory effort, no increased work of breathing. GI: Abdomen is soft, nontender, nondistended, no abdominal masses GU: Normal phallus with orthotopic patent urethral meatus, no discharge Rectal: Normal sphincter tone, 30 cc prostate, nontender, no nodules Skin: No rashes, bruises or suspicious lesions. Neurologic: Grossly intact, no focal deficits, moving all 4 extremities. Psychiatric: Normal mood and affect.  Laboratory Data: PSA trend as above  Urinalysis Patient was unable to void today, bladder scan showed minimal in his bladder He was straight catheterized for very scant amount of urine which was sent for culture as there was not enough for culture and UA  Pertinent Imaging: Na  Assessment & Plan:    1. Elevated PSA No history of fluctuating PSA, recent rise PSA rechecked again today Rectal exam is reassuring Given his age and comorbidities, if his PSA is trending back down, will plan to repeat again in 6 months Patient is agreeable this plan  2. Penile pain Differential diagnosis includes bladder spasm/pyocystis amongst  others Minimal urine in bladder We will send urine for culture to rule out underlying infection although not suspected Suspect neuropathy as cause of penile pain no definitive etiology is unknown - Bladder Scan (Post Void Residual) in office  3. Anuria Low urine output secondary to oligo/anuria from end-stage renal disease on hemodialysis Patient reassured  Return for Results and follow-up plan based on this.  Hollice Espy, MD  [facility] [address] [place], [place] [address] [phone]

## 2018-11-12 NOTE — Progress Notes (Signed)
In and Out Catheterization  Patient is present today for a I & O catheterization due to not able to urinate. Patient was cleaned and prepped in a sterile fashion with betadine and Lidocaine 2% jelly was instilled into the urethra.  A 14 coude FR cath was inserted no complications were noted , 68ml of urine return was noted, urine was orange in color. A clean urine sample was collected for urine culture. Bladder was drained  And catheter was removed with out difficulty.    Preformed by: Fonnie Jarvis, CMA  Follow up/ Additional notes: will call with results

## 2018-11-12 NOTE — Addendum Note (Signed)
Addended by: Elmo Putt on: 11/12/2018 10:48 AM   Modules accepted: Orders

## 2018-11-13 LAB — URINE CULTURE

## 2018-11-30 ENCOUNTER — Inpatient Hospital Stay: Payer: Medicare Other

## 2018-11-30 ENCOUNTER — Other Ambulatory Visit: Payer: Self-pay

## 2018-11-30 ENCOUNTER — Inpatient Hospital Stay: Payer: Medicare Other | Attending: Internal Medicine | Admitting: Internal Medicine

## 2018-11-30 VITALS — BP 106/66 | HR 79 | Temp 97.0°F | Resp 20 | Ht 67.0 in | Wt 154.3 lb

## 2018-11-30 DIAGNOSIS — I129 Hypertensive chronic kidney disease with stage 1 through stage 4 chronic kidney disease, or unspecified chronic kidney disease: Secondary | ICD-10-CM | POA: Diagnosis not present

## 2018-11-30 DIAGNOSIS — R972 Elevated prostate specific antigen [PSA]: Secondary | ICD-10-CM | POA: Insufficient documentation

## 2018-11-30 DIAGNOSIS — N189 Chronic kidney disease, unspecified: Secondary | ICD-10-CM | POA: Diagnosis not present

## 2018-11-30 DIAGNOSIS — D696 Thrombocytopenia, unspecified: Secondary | ICD-10-CM | POA: Diagnosis present

## 2018-11-30 DIAGNOSIS — Z992 Dependence on renal dialysis: Secondary | ICD-10-CM | POA: Insufficient documentation

## 2018-11-30 DIAGNOSIS — N201 Calculus of ureter: Secondary | ICD-10-CM | POA: Diagnosis not present

## 2018-11-30 LAB — CBC WITH DIFFERENTIAL/PLATELET
ABS IMMATURE GRANULOCYTES: 0.01 10*3/uL (ref 0.00–0.07)
BASOS PCT: 1 %
Basophils Absolute: 0 10*3/uL (ref 0.0–0.1)
Eosinophils Absolute: 0.1 10*3/uL (ref 0.0–0.5)
Eosinophils Relative: 4 %
HCT: 28.2 % — ABNORMAL LOW (ref 39.0–52.0)
HEMOGLOBIN: 9.1 g/dL — AB (ref 13.0–17.0)
Immature Granulocytes: 0 %
Lymphocytes Relative: 39 %
Lymphs Abs: 1.3 10*3/uL (ref 0.7–4.0)
MCH: 30.8 pg (ref 26.0–34.0)
MCHC: 32.3 g/dL (ref 30.0–36.0)
MCV: 95.6 fL (ref 80.0–100.0)
Monocytes Absolute: 0.4 10*3/uL (ref 0.1–1.0)
Monocytes Relative: 12 %
Neutro Abs: 1.5 10*3/uL — ABNORMAL LOW (ref 1.7–7.7)
Neutrophils Relative %: 44 %
PLATELETS: 103 10*3/uL — AB (ref 150–400)
RBC: 2.95 MIL/uL — ABNORMAL LOW (ref 4.22–5.81)
RDW: 14 % (ref 11.5–15.5)
WBC: 3.4 10*3/uL — ABNORMAL LOW (ref 4.0–10.5)
nRBC: 0 % (ref 0.0–0.2)

## 2018-11-30 LAB — LACTATE DEHYDROGENASE: LDH: 139 U/L (ref 98–192)

## 2018-11-30 LAB — VITAMIN B12: Vitamin B-12: 1288 pg/mL — ABNORMAL HIGH (ref 180–914)

## 2018-11-30 LAB — IRON AND TIBC
IRON: 126 ug/dL (ref 45–182)
Saturation Ratios: 80 % — ABNORMAL HIGH (ref 17.9–39.5)
TIBC: 158 ug/dL — ABNORMAL LOW (ref 250–450)
UIBC: 33 ug/dL

## 2018-11-30 LAB — FERRITIN: Ferritin: 1161 ng/mL — ABNORMAL HIGH (ref 24–336)

## 2018-11-30 LAB — FOLATE: Folate: 36 ng/mL (ref >5.9)

## 2018-11-30 NOTE — Assessment & Plan Note (Addendum)
#  Thrombocytopenia-chronic likely since 2016.  However most recently 48.  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   #Patient is clinically asymptomatic at this time-platelets most recently 38.  Recommend surveillance at this time; however if platelets less than 50/or start bleeding recommend prednisone.   # Anemia: Around 9/second to CKD-dialysis.  Question on  EPO. Check iron studies ferritin today.   # Elevated PSA-enlarged prostate/?ureteral stone-followed by urology.  #Check labs today CBC/LDH iron studies folic acid P37 myeloma work-up hepatitis C/hepatitis B work-up  Thank you Dr.George for allowing me to participate in the care of your pleasant patient. Please do not hesitate to contact me with questions or concerns in the interim.  #Disposition: #Labs today- #Follow-up in 3 months-labs/CBC-?MD [defer to pt]

## 2018-11-30 NOTE — Progress Notes (Signed)
Lawrence [name] CONSULT NOTE  Patient Care Team: Sharyne Peach, MD as PCP - General (Family Medicine)  CHIEF COMPLAINTS/PURPOSE OF CONSULTATION:  Thrombocytopenia  #Chronic moderate thrombocytopenia [at least 2016]; May 2019- HIV neg; November 2019 CT urogram-negative for cirrhosis/splenomegaly.  #Mild anemia-ESRD/HD [Dr.Lateef/Kolluru]  #November 2019 elevated PSA-prostate enlargement-Dr. Erlene Quan urology  # schizoaffective disorder/group home resident  HISTORY OF PRESENTING ILLNESS: Patient is vague historian/history of schizo affective disorder group home resident.  Lawrence [name] 62 y.o.  male end-stage renal disease on hemodialysis has been referred to [place] for further evaluation recommendations for thrombocytopenia.  Patient denies any easy bruising, bleeding or nosebleeds.  Patient did have episode of hematuria difficulty urination for which she has been recently been evaluated with a CT urogram and also urology.  Noted to have elevated PSA.   Patient denies any alcohol.  Denies any new medication except iron pill.  Denies any liver or spleen disease.  Denies any recent hospitalizations  Review of Systems  Constitutional: Negative for chills, diaphoresis, fever, malaise/fatigue and weight loss.  HENT: Negative for nosebleeds and sore throat.   Eyes: Negative for double vision.  Respiratory: Negative for cough, hemoptysis, sputum production, shortness of breath and wheezing.   Cardiovascular: Negative for chest pain, palpitations, orthopnea and leg swelling.  Gastrointestinal: Negative for abdominal pain, blood in stool, constipation, diarrhea, heartburn, melena, nausea and vomiting.  Genitourinary: Negative for dysuria, frequency and urgency.  Musculoskeletal: Negative for back pain and joint pain.  Skin: Negative.  Negative for itching and rash.  Neurological: Negative for dizziness, tingling, focal weakness, weakness and headaches.  Endo/Heme/Allergies: Does not  bruise/bleed easily.  Psychiatric/Behavioral: Negative for depression. The patient is not nervous/anxious and does not have insomnia.      MEDICAL HISTORY:  Past Medical History:  Diagnosis Date  . Anemia   . Cerebral hemorrhage ([place]) 2012  . Chronic constipation   . Chronic kidney disease   . Diabetes mellitus without complication ([place])   . Hemodialysis patient ([place])   . Hyperlipidemia   . Hypertension   . Schizophrenia ([place])   . Stroke [facility])     SURGICAL HISTORY: Past Surgical History:  Procedure Laterality Date  . A/V FISTULAGRAM Left 02/24/2017   Procedure: A/V Fistulagram;  Surgeon: Katha Cabal, MD;  Location: [facility];  Service: Cardiovascular;  Laterality: Left;  . PERIPHERAL VASCULAR CATHETERIZATION N/A 04/17/2015   Procedure: Dialysis/Perma Catheter Insertion;  Surgeon: Katha Cabal, MD;  Location: [facility];  Service: Cardiovascular;  Laterality: N/A;  . PERIPHERAL VASCULAR CATHETERIZATION Left 07/30/2015   Procedure: A/V Shuntogram/Fistulagram;  Surgeon: Algernon Huxley, MD;  Location: [facility] [facility];  Service: Cardiovascular;  Laterality: Left;  . PERIPHERAL VASCULAR CATHETERIZATION Left 07/30/2015   Procedure: A/V Shunt Intervention;  Surgeon: Algernon Huxley, MD;  Location: [facility] [facility];  Service: Cardiovascular;  Laterality: Left;  . PERIPHERAL VASCULAR CATHETERIZATION N/A 08/20/2015   Procedure: DIALYSIS/PERMA CATHETER REMOVAL;  Surgeon: Algernon Huxley, MD;  Location: [place] [facility];  Service: Cardiovascular;  Laterality: N/A;  . VASCULAR SURGERY     Fistula placement    SOCIAL HISTORY: Social History   Socioeconomic History  . Marital status: Single    Spouse name: Not on file  . Number of children: Not on file  . Years of education: Not on file  . Highest education level: Not on file  Occupational History  . Not on file  Social Needs  . Emergency planning/management officer  strain: Not on file  . Food insecurity:    Worry: Not on  file    Inability: Not on file  . Transportation needs:    Medical: Not on file    Non-medical: Not on file  Tobacco Use  . Smoking status: Never Smoker  . Smokeless tobacco: Never Used  Substance and Sexual Activity  . Alcohol use: No  . Drug use: No  . Sexual activity: Not on file  Lifestyle  . Physical activity:    Days per week: Not on file    Minutes per session: Not on file  . Stress: Not on file  Relationships  . Social connections:    Talks on phone: Not on file    Gets together: Not on file    Attends religious service: Not on file    Active member of club or organization: Not on file    Attends meetings of clubs or organizations: Not on file    Relationship status: Not on file  . Intimate partner violence:    Fear of current or ex partner: Not on file    Emotionally abused: Not on file    Physically abused: Not on file    Forced sexual activity: Not on file  Other Topics Concern  . Not on file  Social History Narrative  . Not on file    FAMILY HISTORY: Family History  Problem Relation Age of Onset  . Diabetes Mother   . Kidney cancer Mother   . Diabetes Sister   . Stroke Brother   . Prostate cancer Neg Hx   . Bladder Cancer Neg Hx     ALLERGIES:  is allergic to nsaids.  MEDICATIONS:  Current Outpatient Medications  Medication Sig Dispense Refill  . amLODipine (NORVASC) 2.5 MG tablet Take 2.5 mg 2 (two) times daily by mouth.    [name] aspirin EC 81 MG tablet Take 81 mg by mouth daily.    . benztropine (COGENTIN) 0.5 MG tablet Take 0.5 mg by mouth 2 (two) times daily.    . carvedilol (COREG) 25 MG tablet Take 25 mg by mouth at bedtime.     . cetirizine (ZYRTEC) 10 MG tablet Take 10 mg by mouth at bedtime.     . cloNIDine (CATAPRES) 0.3 MG tablet Take 0.3 mg by mouth 2 (two) times daily.    . Coenzyme Q10 100 MG TABS Take 100 mg by mouth at bedtime.    . docusate sodium (COLACE) 100 MG capsule Take 100 mg by mouth 2 (two) times daily.    . ferrous sulfate  325 (65 FE) MG EC tablet Take 1 tablet by mouth daily.    . fluticasone (FLONASE) 50 MCG/ACT nasal spray Place 2 sprays daily as needed into both nostrils for allergies or rhinitis.    [name] glipiZIDE (GLUCOTROL XL) 5 MG 24 hr tablet Take 5 mg by mouth daily with breakfast.    . haloperidol (HALDOL) 5 MG tablet Take 7.5 mg by mouth at bedtime. At bedtime     . lactulose (CHRONULAC) 10 GM/15ML solution Take 10 g by mouth daily.    [name] lidocaine-prilocaine (EMLA) cream Apply 1 application topically 3 (three) times a week. Apply over dialysis shunt 30 minutes prior to dialysis.    [name] losartan (COZAAR) 100 MG tablet Take 1 tablet (100 mg total) by mouth daily. 30 tablet 0  . Multiple Vitamins-Minerals (MULTIVITAMIN WITH MINERALS) tablet Take 1 tablet by mouth daily.    . niacin (NIASPAN) 1000 MG  CR tablet Take 1,000 mg by mouth at bedtime.    . pravastatin (PRAVACHOL) 40 MG tablet Take 40 mg by mouth at bedtime.    . Vitamin D, Ergocalciferol, (DRISDOL) 50000 UNITS CAPS capsule Take 50,000 Units every 30 (thirty) days by mouth.      No current facility-administered medications for this visit.       [name]  PHYSICAL EXAMINATION:  Vitals:   11/30/18 0933  BP: 106/66  Pulse: 79  Resp: 20  Temp: (!) 97 F (36.1 C)   Filed Weights   11/30/18 0933  Weight: 154 lb 5.2 oz (70 kg)    Physical Exam  Constitutional: He is oriented to person, place, and time and well-developed, well-nourished, and in no distress.  HENT:  Head: Normocephalic and atraumatic.  Mouth/Throat: Oropharynx is clear and moist. No oropharyngeal exudate.  Eyes: Pupils are equal, round, and reactive to light.  Neck: Normal range of motion. Neck supple.  Cardiovascular: Normal rate and regular rhythm.  Pulmonary/Chest: No respiratory distress. He has no wheezes.  Abdominal: Soft. Bowel sounds are normal. He exhibits no distension and no mass. There is no abdominal tenderness. There is no rebound and no guarding.  Musculoskeletal:  Normal range of motion.        General: No tenderness or edema.  Neurological: He is alert and oriented to person, place, and time.  Skin: Skin is warm.  Psychiatric: Affect normal.     LABORATORY DATA:  I have reviewed the data as listed Lab Results  Component Value Date   WBC 8.2 10/16/2017   HGB 10.1 (L) 10/16/2017   HCT 30.2 (L) 10/16/2017   MCV 94.7 10/16/2017   PLT 79 (L) 10/16/2017   No results for input(s): NA, K, CL, CO2, GLUCOSE, BUN, CREATININE, CALCIUM, GFRNONAA, GFRAA, PROT, ALBUMIN, AST, ALT, ALKPHOS, BILITOT, BILIDIR, IBILI in the last 8760 hours.  RADIOGRAPHIC STUDIES: I have personally reviewed the radiological images as listed and agreed with the findings in the report. No results found.  Thrombocytopenia ([place]) #Thrombocytopenia-chronic likely since 2016.  However most recently 64.  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   #Patient is clinically asymptomatic at this time-platelets most recently 7.  Recommend surveillance at this time; however if platelets less than 50/or start bleeding recommend prednisone.   # Anemia: Around 9/second to CKD-dialysis.  Question on  EPO. Check iron studies ferritin today.   # Elevated PSA/ureteral stone-followed by urology.  #Check labs today CBC/LDH iron studies folic acid V69 myeloma work-up hepatitis C/hepatitis B work-up  Thank you Dr.George for allowing me to participate in the care of your pleasant patient. Please do not hesitate to contact me with questions or concerns in the interim.  #Disposition: #Labs today- #Follow-up in 3 months-labs/CBC-?MD [defer to pt]  All questions were answered. The patient knows to call the clinic with any problems, questions or concerns.    Cammie Sickle, MD 11/30/2018 10:21 AM

## 2018-12-01 LAB — HEPATITIS B SURFACE ANTIGEN: Hepatitis B Surface Ag: NEGATIVE

## 2018-12-01 LAB — KAPPA/LAMBDA LIGHT CHAINS
KAPPA FREE LGHT CHN: 324.2 mg/L — AB (ref 3.3–19.4)
Kappa, lambda light chain ratio: 1.77 — ABNORMAL HIGH (ref 0.26–1.65)
LAMDA FREE LIGHT CHAINS: 183.5 mg/L — AB (ref 5.7–26.3)

## 2018-12-01 LAB — HEPATITIS C ANTIBODY: HCV Ab: 0.1 s/co ratio (ref 0.0–0.9)

## 2018-12-03 LAB — MULTIPLE MYELOMA PANEL, SERUM
ALBUMIN/GLOB SERPL: 1.4 (ref 0.7–1.7)
Albumin SerPl Elph-Mcnc: 4 g/dL (ref 2.9–4.4)
Alpha 1: 0.2 g/dL (ref 0.0–0.4)
Alpha2 Glob SerPl Elph-Mcnc: 0.5 g/dL (ref 0.4–1.0)
B-Globulin SerPl Elph-Mcnc: 0.6 g/dL — ABNORMAL LOW (ref 0.7–1.3)
Gamma Glob SerPl Elph-Mcnc: 1.7 g/dL (ref 0.4–1.8)
Globulin, Total: 3 g/dL (ref 2.2–3.9)
IgA: 77 mg/dL (ref 61–437)
IgG (Immunoglobin G), Serum: 1909 mg/dL — ABNORMAL HIGH (ref 700–1600)
IgM (Immunoglobulin M), Srm: 28 mg/dL (ref 20–172)
Total Protein ELP: 7 g/dL (ref 6.0–8.5)

## 2018-12-14 ENCOUNTER — Telehealth: Payer: Self-pay | Admitting: Internal Medicine

## 2018-12-14 DIAGNOSIS — D696 Thrombocytopenia, unspecified: Secondary | ICD-10-CM

## 2018-12-14 NOTE — Telephone Encounter (Signed)
Heather/Brooke-please inform patient/family that his labs look overall stable [chronic anemia from his kidney disease; chronic low platelets-which does not need treatment at this time].  Patient however needs to follow-up in approximately 3 months/labs-cbc-cmp.This was discussed at last visit.  He could choose to follow-up with me in [place] or with Dr. Mike Gip in [place].   GB

## 2018-12-14 NOTE — Telephone Encounter (Signed)
Care taker at [facility] notified of these results and verbalized understanding. She states that since [place] is closer, patient can follow up in [place] with Dr. Mike Gip. Shirlean Mylar, would you mind scheduling patient in 3 months for lab/MD - thank you!   She asked that I fax this information to the facility for patient's records. Fax number [phone]. Information has been faxed.

## 2018-12-14 NOTE — Addendum Note (Signed)
Addended by: Sandria Bales B on: 12/14/2018 12:58 PM   Modules accepted: Orders

## 2018-12-20 ENCOUNTER — Telehealth: Payer: Self-pay

## 2018-12-20 ENCOUNTER — Encounter: Payer: Self-pay | Admitting: Urology

## 2018-12-20 DIAGNOSIS — R972 Elevated prostate specific antigen [PSA]: Secondary | ICD-10-CM

## 2018-12-20 NOTE — Telephone Encounter (Signed)
Patient's wife notified and appointments were made order in for psa

## 2018-12-20 NOTE — Telephone Encounter (Signed)
-----   Message from Hollice Espy, MD sent at 12/20/2018  4:22 PM EST ----- Please let this patient his group home know that his PSA is trending back downwards which is reassuring.  I would like to see him again in 5 months from now for repeat PSA (prior to visit please).  Please arrange.  Hollice Espy, MD

## 2019-03-01 ENCOUNTER — Ambulatory Visit: Payer: Medicare Other | Admitting: Hematology and Oncology

## 2019-03-01 ENCOUNTER — Other Ambulatory Visit: Payer: Medicare Other

## 2019-03-11 ENCOUNTER — Telehealth: Payer: Self-pay

## 2019-03-11 NOTE — Telephone Encounter (Signed)
Contacted patient to inquire on any potential symptoms. Patient denies any symptoms at this time. Since patient states he is feeling okay, advised him Dr. Mike Gip would like to reschedule his appt. 1 month from scheduled date d/t Coronavirus. Patient verbalizes understanding and denies any questions. Advised Shirlean Mylar would reach out to reschedule.

## 2019-03-18 ENCOUNTER — Ambulatory Visit: Payer: Medicare Other | Admitting: Hematology and Oncology

## 2019-03-18 ENCOUNTER — Other Ambulatory Visit: Payer: Medicare Other

## 2019-04-01 ENCOUNTER — Ambulatory Visit (INDEPENDENT_AMBULATORY_CARE_PROVIDER_SITE_OTHER): Payer: Medicare Other | Admitting: Vascular Surgery

## 2019-04-01 ENCOUNTER — Encounter (INDEPENDENT_AMBULATORY_CARE_PROVIDER_SITE_OTHER): Payer: Medicare Other

## 2019-04-10 ENCOUNTER — Other Ambulatory Visit: Payer: Self-pay

## 2019-04-11 ENCOUNTER — Other Ambulatory Visit: Payer: Self-pay

## 2019-04-11 ENCOUNTER — Inpatient Hospital Stay: Payer: Medicare Other | Attending: Hematology and Oncology

## 2019-04-11 DIAGNOSIS — I120 Hypertensive chronic kidney disease with stage 5 chronic kidney disease or end stage renal disease: Secondary | ICD-10-CM | POA: Insufficient documentation

## 2019-04-11 DIAGNOSIS — F209 Schizophrenia, unspecified: Secondary | ICD-10-CM | POA: Diagnosis not present

## 2019-04-11 DIAGNOSIS — N186 End stage renal disease: Secondary | ICD-10-CM | POA: Insufficient documentation

## 2019-04-11 DIAGNOSIS — Z8673 Personal history of transient ischemic attack (TIA), and cerebral infarction without residual deficits: Secondary | ICD-10-CM | POA: Insufficient documentation

## 2019-04-11 DIAGNOSIS — Z992 Dependence on renal dialysis: Secondary | ICD-10-CM | POA: Diagnosis not present

## 2019-04-11 DIAGNOSIS — D631 Anemia in chronic kidney disease: Secondary | ICD-10-CM | POA: Insufficient documentation

## 2019-04-11 DIAGNOSIS — E1122 Type 2 diabetes mellitus with diabetic chronic kidney disease: Secondary | ICD-10-CM | POA: Diagnosis not present

## 2019-04-11 DIAGNOSIS — D696 Thrombocytopenia, unspecified: Secondary | ICD-10-CM | POA: Diagnosis present

## 2019-04-11 DIAGNOSIS — Z79899 Other long term (current) drug therapy: Secondary | ICD-10-CM | POA: Diagnosis not present

## 2019-04-11 LAB — COMPREHENSIVE METABOLIC PANEL
ALT: 14 U/L (ref 0–44)
AST: 17 U/L (ref 15–41)
Albumin: 3.7 g/dL (ref 3.5–5.0)
Alkaline Phosphatase: 78 U/L (ref 38–126)
Anion gap: 11 (ref 5–15)
BUN: 47 mg/dL — ABNORMAL HIGH (ref 8–23)
CO2: 31 mmol/L (ref 22–32)
Calcium: 9.4 mg/dL (ref 8.9–10.3)
Chloride: 93 mmol/L — ABNORMAL LOW (ref 98–111)
Creatinine, Ser: 10.04 mg/dL — ABNORMAL HIGH (ref 0.61–1.24)
GFR calc Af Amer: 6 mL/min — ABNORMAL LOW (ref >60)
GFR calc non Af Amer: 5 mL/min — ABNORMAL LOW (ref >60)
Glucose, Bld: 165 mg/dL — ABNORMAL HIGH (ref 70–99)
Potassium: 4.6 mmol/L (ref 3.5–5.1)
Sodium: 135 mmol/L (ref 135–145)
Total Bilirubin: 0.6 mg/dL (ref 0.3–1.2)
Total Protein: 6.9 g/dL (ref 6.5–8.1)

## 2019-04-11 LAB — CBC WITH DIFFERENTIAL/PLATELET
Abs Immature Granulocytes: 0.01 10*3/uL (ref 0.00–0.07)
Basophils Absolute: 0 10*3/uL (ref 0.0–0.1)
Basophils Relative: 1 %
Eosinophils Absolute: 0.1 10*3/uL (ref 0.0–0.5)
Eosinophils Relative: 4 %
HCT: 32.9 % — ABNORMAL LOW (ref 39.0–52.0)
Hemoglobin: 10.6 g/dL — ABNORMAL LOW (ref 13.0–17.0)
Immature Granulocytes: 0 %
Lymphocytes Relative: 39 %
Lymphs Abs: 1.3 10*3/uL (ref 0.7–4.0)
MCH: 31.2 pg (ref 26.0–34.0)
MCHC: 32.2 g/dL (ref 30.0–36.0)
MCV: 96.8 fL (ref 80.0–100.0)
Monocytes Absolute: 0.4 10*3/uL (ref 0.1–1.0)
Monocytes Relative: 11 %
Neutro Abs: 1.5 10*3/uL — ABNORMAL LOW (ref 1.7–7.7)
Neutrophils Relative %: 45 %
Platelets: 78 10*3/uL — ABNORMAL LOW (ref 150–400)
RBC: 3.4 MIL/uL — ABNORMAL LOW (ref 4.22–5.81)
RDW: 15.8 % — ABNORMAL HIGH (ref 11.5–15.5)
WBC: 3.3 10*3/uL — ABNORMAL LOW (ref 4.0–10.5)
nRBC: 0 % (ref 0.0–0.2)

## 2019-04-12 ENCOUNTER — Telehealth: Payer: Self-pay | Admitting: Hematology and Oncology

## 2019-04-12 ENCOUNTER — Other Ambulatory Visit: Payer: Self-pay

## 2019-04-12 NOTE — Progress Notes (Signed)
[name]  [address], [address] [place], [place] [address] Phone: [phone]  Fax: [phone]   Telemedicine Office Visit:  04/13/2019  Referring physician: Sharyne Peach, MD  I connected with Lawrence [name] on 04/13/2019 at 3:31 PM by videoconferencing and verified that I was speaking with the correct person using 2 identifiers.  The patient was at an assisted living facility. I discussed the limitations, risk, security and privacy concerns of performing an evaluation and management service by videoconferencing and the availability of in person appointments.  I also discussed with the patient that there may be a patient responsible charge related to this service.  The patient expressed understanding and agreed to proceed.   Chief Complaint: Lawrence [name] is a 63 y.o. male with end-stage renal disease on hemodialysis with thrombocytopenia and anemia who is seen for new patient assessment.   HPI: The patient has chronic thrombocytopenia dating back to at least 04/16/2015. Platelet count has fluctuated between 70,000 to 110,000 without trend.  The patient was seen in consultation by Dr. Charlaine [name] on 11/30/2018.  He described one episode of hematuria and difficulty urinating.  He denied any alcohol use. He was on oral iron.  CT stone study on 10/13/2017 revealed right periureteral inflammatory changes without evidence of obstructive uropathy, multicystic kidneys with suboptimal evaluation of the bilateral renal hypoattenuated lesions due to lack of IV contrast, and prostate gland enlargement.  Spleen was normal.  He was evaluated by Dr Lawrence [name] on 11/12/2018.  PSA was 6.09 in 10/2018 and 5.18 on 11/12/2018.  Given his age and co-morbidities, plan was to repeat his PSA in 6 months if his PSA was declining.    Labs on 11/30/2018 revealed: WBC 3,400 (ANC 1500), hemoglobin 9.1, hematocrit 28.2, platelets 103,000.  SPEP revealed no monoclonal protein.   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  IgG 1,909, IgA 77, and IgM 28.  Kappa light chain 342.2. Lambda light chain 183.5.  Ratio was 1.77 (0.26-1.65).  Iron saturation was 80%.  Ferritin was 1,161. B12 was 1288.  Folate 36.0. LDH 139. HCV Ab negative. HBV surface antigen negative.   CBC on 04/11/2019: WBC 3,300 (ANC 1,500), hemoglobin 10.6, hematocrit 32.9, and platelets 78,000. BUN 47. Creatinine 10.04.   During the interim, he has done well. The patient's caregiver is his primary historian. He is currently on dialysis and is not eligible for a kidney transplant.  He had a UTI about 6 months ago. He denies any issues with bruising or bleeding. He is on oral iron therapy.   His caregiver reports he had a bone marrow biopsy in 02/23/2015 under the care of Dr. Ma [name] which was negative for myeloma and myelodysplasia.  The etiology of his thrombocytopenia was unclear.    Past Medical History:  Diagnosis Date  . Anemia   . Cerebral hemorrhage ([place]) 2012  . Chronic constipation   . Chronic kidney disease   . Diabetes mellitus without complication ([place])   . Hemodialysis patient ([place])   . Hyperlipidemia   . Hypertension   . Schizophrenia ([place])   . Stroke [facility])     Past Surgical History:  Procedure Laterality Date  . A/V FISTULAGRAM Left 02/24/2017   Procedure: A/V Fistulagram;  Surgeon: Lawrence Cabal, MD;  Location: [place] [facility];  Service: Cardiovascular;  Laterality: Left;  . PERIPHERAL VASCULAR CATHETERIZATION N/A 04/17/2015   Procedure: Dialysis/Perma Catheter Insertion;  Surgeon: Lawrence Cabal, MD;  Location: [place]  [facility];  Service: Cardiovascular;  Laterality: N/A;  . PERIPHERAL VASCULAR CATHETERIZATION Left 07/30/2015   Procedure: A/V Shuntogram/Fistulagram;  Surgeon: Lawrence Huxley, MD;  Location: [place] [facility];  Service: Cardiovascular;  Laterality: Left;  . PERIPHERAL VASCULAR CATHETERIZATION Left 07/30/2015    Procedure: A/V Shunt Intervention;  Surgeon: Lawrence Huxley, MD;  Location: [place] [facility];  Service: Cardiovascular;  Laterality: Left;  . PERIPHERAL VASCULAR CATHETERIZATION N/A 08/20/2015   Procedure: DIALYSIS/PERMA CATHETER REMOVAL;  Surgeon: Lawrence Huxley, MD;  Location: [place] [facility];  Service: Cardiovascular;  Laterality: N/A;  . VASCULAR SURGERY     Fistula placement    Family History  Problem Relation Age of Onset  . Diabetes Mother   . Kidney cancer Mother   . Diabetes Sister   . Stroke Brother   . Prostate cancer Neg Hx   . Bladder Cancer Neg Hx     Social History:  reports that he has never smoked. He has never used smokeless tobacco. He reports that he does not drink alcohol or use drugs.He has been at [facility] since 2014. The patient is accompanied by his caregiver Lawrence [name] today.  Participants in the patient's visit and their role in the encounter included the patient, his family mem, and Lawrence Budge, RN today.  The intake visit was provided by Lawrence Budge, RN.  Allergies:  Allergies  Allergen Reactions  . Nsaids Other (See Comments)    Cannot take due to renal disease    Current Medications: Current Outpatient Medications  Medication Sig Dispense Refill  . acetaminophen (TYLENOL) 500 MG tablet Take 500 mg by mouth every 6 (six) hours as needed.    [name] amLODipine (NORVASC) 2.5 MG tablet Take 2.5 mg 2 (two) times daily by mouth.    [name] aspirin EC 81 MG tablet Take 81 mg by mouth daily.    . benztropine (COGENTIN) 0.5 MG tablet Take 0.5 mg by mouth 2 (two) times daily.    . carvedilol (COREG) 25 MG tablet Take 25 mg by mouth 2 (two) times daily with a meal.     . cetirizine (ZYRTEC) 10 MG tablet Take 10 mg by mouth at bedtime.     . cloNIDine (CATAPRES) 0.3 MG tablet Take 0.3 mg by mouth 2 (two) times daily.    . Coenzyme Q10 100 MG TABS Take 100 mg by mouth at bedtime.    . docusate sodium (COLACE) 100 MG capsule Take 100 mg by  mouth 2 (two) times daily.    [name] ENULOSE 10 GM/15ML SOLN 10 g daily.     . ferrous sulfate 325 (65 FE) MG EC tablet Take 1 tablet by mouth daily.    . fluticasone (FLONASE) 50 MCG/ACT nasal spray Place 2 sprays daily as needed into both nostrils for allergies or rhinitis.    [name] glipiZIDE (GLUCOTROL XL) 5 MG 24 hr tablet Take 2.5 mg by mouth daily with breakfast.     . haloperidol (HALDOL) 5 MG tablet Take 7.5 mg by mouth at bedtime. At bedtime     . hydrALAZINE (APRESOLINE) 25 MG tablet Take 25 mg by mouth 2 (two) times a day.     . lidocaine-prilocaine (EMLA) cream Apply 1 application topically 3 (three) times a week. Apply over dialysis shunt 30 minutes prior to dialysis.    [name] losartan (COZAAR) 100 MG tablet Take 1 tablet (100 mg total) by mouth daily. 30 tablet 0  . Multiple Vitamins-Minerals (MULTIVITAMIN WITH MINERALS)  tablet Take 1 tablet by mouth daily.    . niacin (NIASPAN) 1000 MG CR tablet Take 1,000 mg by mouth at bedtime.    . pravastatin (PRAVACHOL) 40 MG tablet Take 40 mg by mouth at bedtime.    . Vitamin D, Ergocalciferol, (DRISDOL) 50000 UNITS CAPS capsule Take 50,000 Units every 30 (thirty) days by mouth.      No current facility-administered medications for this visit.     Review of Systems  Constitutional: Negative.  Negative for chills, diaphoresis, fever, malaise/fatigue and weight loss.  HENT: Negative for congestion, ear pain, hearing loss, nosebleeds, sinus pain and sore throat.   Eyes: Negative.  Negative for blurred vision and double vision.  Respiratory: Negative.  Negative for cough, hemoptysis, shortness of breath and wheezing.   Cardiovascular: Negative.  Negative for chest pain, palpitations, leg swelling and PND.  Gastrointestinal: Negative for abdominal pain, blood in stool, constipation, diarrhea, heartburn, melena, nausea and vomiting.       BMs 2-3x weekly.  Genitourinary: Negative for dysuria, frequency, hematuria and urgency.       Patient on dialysis.   Little urine output.  Musculoskeletal: Negative for back pain, joint pain, myalgias and neck pain.       No bone pain.  Skin: Negative.  Negative for itching and rash.  Neurological: Negative for dizziness, sensory change, weakness and headaches.       Cognitive issues per care provider.  Endo/Heme/Allergies: Does not bruise/bleed easily.  Psychiatric/Behavioral: Negative.  Negative for depression and memory loss. The patient is not nervous/anxious and does not have insomnia.     Performance status (ECOG): 2  Physical Exam  Constitutional: He is oriented to person, place, and time. He appears well-developed and well-nourished. No distress.  HENT:  Wearing a mask.  Eyes: Pupils are equal, round, and reactive to light. Conjunctivae and EOM are normal.  Neurological: He is alert and oriented to person, place, and time.  Skin: He is not diaphoretic.  Psychiatric: He has a normal mood and affect. His behavior is normal. Judgment and thought content normal.  Nursing note reviewed.   Appointment on 04/11/2019  Component Date Value Ref Range Status  . Sodium 04/11/2019 135  135 - 145 mmol/L Final  . Potassium 04/11/2019 4.6  3.5 - 5.1 mmol/L Final  . Chloride 04/11/2019 93* 98 - 111 mmol/L Final  . CO2 04/11/2019 31  22 - 32 mmol/L Final  . Glucose, Bld 04/11/2019 165* 70 - 99 mg/dL Final  . BUN 04/11/2019 47* 8 - 23 mg/dL Final  . Creatinine, Ser 04/11/2019 10.04* 0.61 - 1.24 mg/dL Final  . Calcium 04/11/2019 9.4  8.9 - 10.3 mg/dL Final  . Total Protein 04/11/2019 6.9  6.5 - 8.1 g/dL Final  . Albumin 04/11/2019 3.7  3.5 - 5.0 g/dL Final  . AST 04/11/2019 17  15 - 41 U/L Final  . ALT 04/11/2019 14  0 - 44 U/L Final  . Alkaline Phosphatase 04/11/2019 78  38 - 126 U/L Final  . Total Bilirubin 04/11/2019 0.6  0.3 - 1.2 mg/dL Final  . GFR calc non Af Amer 04/11/2019 5* >60 mL/min Final  . GFR calc Af Amer 04/11/2019 6* >60 mL/min Final  . Anion gap 04/11/2019 11  5 - 15 Final   Performed  at [facility], [address], [place], [place] [address]  . WBC 04/11/2019 3.3* 4.0 - 10.5 K/uL Final  . RBC 04/11/2019 3.40* 4.22 - 5.81 MIL/uL Final  . Hemoglobin  04/11/2019 10.6* 13.0 - 17.0 g/dL Final  . HCT 04/11/2019 32.9* 39.0 - 52.0 % Final  . MCV 04/11/2019 96.8  80.0 - 100.0 fL Final  . MCH 04/11/2019 31.2  26.0 - 34.0 pg Final  . MCHC 04/11/2019 32.2  30.0 - 36.0 g/dL Final  . RDW 04/11/2019 15.8* 11.5 - 15.5 % Final  . Platelets 04/11/2019 78* 150 - 400 K/uL Final  . nRBC 04/11/2019 0.0  0.0 - 0.2 % Final  . Neutrophils Relative % 04/11/2019 45  % Final  . Neutro Abs 04/11/2019 1.5* 1.7 - 7.7 K/uL Final  . Lymphocytes Relative 04/11/2019 39  % Final  . Lymphs Abs 04/11/2019 1.3  0.7 - 4.0 K/uL Final  . Monocytes Relative 04/11/2019 11  % Final  . Monocytes Absolute 04/11/2019 0.4  0.1 - 1.0 K/uL Final  . Eosinophils Relative 04/11/2019 4  % Final  . Eosinophils Absolute 04/11/2019 0.1  0.0 - 0.5 K/uL Final  . Basophils Relative 04/11/2019 1  % Final  . Basophils Absolute 04/11/2019 0.0  0.0 - 0.1 K/uL Final  . Immature Granulocytes 04/11/2019 0  % Final  . Abs Immature Granulocytes 04/11/2019 0.01  0.00 - 0.07 K/uL Final   Performed at [facility], [address] [place], [place] [address]    Assessment:  Lawrence [name] is a 63 y.o. male with a normocytic anemia and thrombocytopenia.  Platelet count dating back to at least 04/16/2015 has fluctuated between 70,000 to 110,000 without trend.  Spleen was normal on 10/13/2017.  Etiology is unclear.  Hydralazine can cause hematologic issues.  He is on no new medications or herbal products.  Bone marrow on 02/23/2015 was available after his appointment.  Bone marrow was hemodilute.  Flow cytometry revealed no significant immunophenotyic abnormalies.  Cytogenetics were normal (46, XY).  Work-up on 11/30/2018 revealed a hematocrit of 28.2, hemoglobin 9.1, MCV 95,6, platelets 103,000, and WBC 3,400 (ANC 1500).  SPEP  revealed no monoclonal protein.  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 Kappa light chain were 342.2 (ratio 1.77).  Ferritin was 1161 with an iron saturation 80%.  Normal studies included:  B12, folate, LDH, hepatitis C antibody, and hepatitis surface antigen.  HIV testing was negative on 10/14/2017.  He has a history of a mildly elevated PSA.  PSA was 6.09 in 10/2018 and 5.18 on 11/12/2018.  Symptomatically, he denies any complaints.  He denies any bruising or bleeding.  He has had no B symptoms.  Plan: 1.   Review labs from 04/11/2019. 2.   Normocytic anemia  Hematocrit 32.9.  Hemoglobin 10.6.  MCV 96.8.  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 3.   Thrombocytopenia  Platelet count 78,000.  Platelet count has fluctuated between 70,000 - 100,000 since 2016.  No new medications or herbal products. 4.   Mild leukopenia  Care provider notes recent UTI.  No other infections.  Etiology may be linked to other cell lines (pancytopenia).  Obtain old records from Dr Lawrence [name]. 5.   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  Patient may require a repeat bone marrow. 6.   Elevated ferritin and iron saturation  Iron saturation 80% (high).  Ferritin 1161 (high).  Patient on oral iron.  Discontinue oral iron.FAX order to [phone]  Follow-up iron studies at next blood draw.  If iron studies remain elevated, will r/o hemochromatosis.  7.   Renal insufficiency on dialysis  Contact Dr Juleen China. 8.   RTC in 1 month for MD assessment and labs (CBC with diff, myeloma panel, ferritin, iron studies, TSH, copper level, and +/- others).  Addendum:  Bone marrow report from 02/23/2015 was available after his appointment.  Bone marrow was hemodilute.  Flow cytometry  revealed no significant immunophenotyic abnormalities.  Cytogenetics were normal, 46, XY.  I discussed the assessment and treatment plan with the patient.  The patient was provided an opportunity to ask questions and all were answered.  The patient agreed with the plan and demonstrated an understanding of the instructions.  The patient was advised to call back or seek an in person evaluation if the symptoms worsen or if the condition fails to improve as anticipated.  I provided 20 minutes (3:31 PM - 3:51PM) of face-to-face video visit time during this this encounter and > 50% was spent counseling as documented under my assessment and plan.  I provided these services from the [facility] office.   Nolon Stalls, MD, PhD  04/13/2019, 3:31 PM  I, Molly Dorshimer, am acting as Education administrator for [employer]. Mike Gip, MD, PhD.  I,  C. Mike Gip, MD, have reviewed the above documentation for accuracy and completeness, and I agree with the above.

## 2019-04-13 ENCOUNTER — Inpatient Hospital Stay (HOSPITAL_BASED_OUTPATIENT_CLINIC_OR_DEPARTMENT_OTHER): Payer: Medicare Other | Admitting: Hematology and Oncology

## 2019-04-13 DIAGNOSIS — D696 Thrombocytopenia, unspecified: Secondary | ICD-10-CM | POA: Diagnosis not present

## 2019-04-13 DIAGNOSIS — D72819 Decreased white blood cell count, unspecified: Secondary | ICD-10-CM | POA: Diagnosis not present

## 2019-04-13 DIAGNOSIS — D472 Monoclonal gammopathy: Secondary | ICD-10-CM | POA: Diagnosis not present

## 2019-04-13 DIAGNOSIS — D631 Anemia in chronic kidney disease: Secondary | ICD-10-CM

## 2019-04-13 DIAGNOSIS — N186 End stage renal disease: Secondary | ICD-10-CM

## 2019-04-13 DIAGNOSIS — R7989 Other specified abnormal findings of blood chemistry: Secondary | ICD-10-CM

## 2019-04-13 NOTE — Progress Notes (Signed)
Confirmed Name and DOB. Denies any concerns. Caregiver assisted with assessment.

## 2019-04-15 ENCOUNTER — Ambulatory Visit: Payer: Medicare Other | Admitting: Hematology and Oncology

## 2019-04-15 ENCOUNTER — Other Ambulatory Visit: Payer: Medicare Other

## 2019-04-17 ENCOUNTER — Encounter: Payer: Self-pay | Admitting: Hematology and Oncology

## 2019-04-17 DIAGNOSIS — D472 Monoclonal gammopathy: Secondary | ICD-10-CM | POA: Insufficient documentation

## 2019-04-17 DIAGNOSIS — R7989 Other specified abnormal findings of blood chemistry: Secondary | ICD-10-CM | POA: Insufficient documentation

## 2019-04-17 DIAGNOSIS — D72819 Decreased white blood cell count, unspecified: Secondary | ICD-10-CM | POA: Insufficient documentation

## 2019-04-18 ENCOUNTER — Telehealth: Payer: Self-pay

## 2019-04-18 NOTE — Telephone Encounter (Signed)
Fax orders to discontinue oral Iron. Fax conformation confirmed

## 2019-04-18 NOTE — Telephone Encounter (Signed)
-----   Message from Lequita Asal, MD sent at 04/17/2019  5:35 PM EDT -----  Please schedule follow-up appointment with labs in 1 month.  Please see if we can locate Dr Pandit's last note (old medical records).  FAX order to discontinue oral iron [phone]).  M

## 2019-04-22 ENCOUNTER — Encounter: Payer: Self-pay | Admitting: Internal Medicine

## 2019-05-06 ENCOUNTER — Ambulatory Visit (INDEPENDENT_AMBULATORY_CARE_PROVIDER_SITE_OTHER): Payer: Medicare Other | Admitting: Vascular Surgery

## 2019-05-06 ENCOUNTER — Encounter (INDEPENDENT_AMBULATORY_CARE_PROVIDER_SITE_OTHER): Payer: Medicare Other

## 2019-05-09 ENCOUNTER — Other Ambulatory Visit: Payer: Self-pay

## 2019-05-09 DIAGNOSIS — D472 Monoclonal gammopathy: Secondary | ICD-10-CM

## 2019-05-09 DIAGNOSIS — D72819 Decreased white blood cell count, unspecified: Secondary | ICD-10-CM

## 2019-05-09 DIAGNOSIS — D696 Thrombocytopenia, unspecified: Secondary | ICD-10-CM

## 2019-05-09 DIAGNOSIS — R7989 Other specified abnormal findings of blood chemistry: Secondary | ICD-10-CM

## 2019-05-09 DIAGNOSIS — D631 Anemia in chronic kidney disease: Secondary | ICD-10-CM

## 2019-05-09 DIAGNOSIS — N186 End stage renal disease: Secondary | ICD-10-CM

## 2019-05-13 ENCOUNTER — Other Ambulatory Visit: Payer: Self-pay

## 2019-05-13 ENCOUNTER — Inpatient Hospital Stay: Payer: Medicare Other | Attending: Hematology and Oncology

## 2019-05-13 DIAGNOSIS — N186 End stage renal disease: Secondary | ICD-10-CM | POA: Diagnosis present

## 2019-05-13 DIAGNOSIS — D72819 Decreased white blood cell count, unspecified: Secondary | ICD-10-CM

## 2019-05-13 DIAGNOSIS — R7989 Other specified abnormal findings of blood chemistry: Secondary | ICD-10-CM

## 2019-05-13 DIAGNOSIS — D472 Monoclonal gammopathy: Secondary | ICD-10-CM

## 2019-05-13 DIAGNOSIS — E785 Hyperlipidemia, unspecified: Secondary | ICD-10-CM | POA: Insufficient documentation

## 2019-05-13 DIAGNOSIS — D696 Thrombocytopenia, unspecified: Secondary | ICD-10-CM | POA: Insufficient documentation

## 2019-05-13 DIAGNOSIS — D631 Anemia in chronic kidney disease: Secondary | ICD-10-CM | POA: Diagnosis not present

## 2019-05-13 LAB — CBC WITH DIFFERENTIAL/PLATELET
Abs Immature Granulocytes: 0.01 10*3/uL (ref 0.00–0.07)
Basophils Absolute: 0 10*3/uL (ref 0.0–0.1)
Basophils Relative: 1 %
Eosinophils Absolute: 0.1 10*3/uL (ref 0.0–0.5)
Eosinophils Relative: 3 %
HCT: 32.9 % — ABNORMAL LOW (ref 39.0–52.0)
Hemoglobin: 10.5 g/dL — ABNORMAL LOW (ref 13.0–17.0)
Immature Granulocytes: 0 %
Lymphocytes Relative: 36 %
Lymphs Abs: 1.3 10*3/uL (ref 0.7–4.0)
MCH: 31.2 pg (ref 26.0–34.0)
MCHC: 31.9 g/dL (ref 30.0–36.0)
MCV: 97.6 fL (ref 80.0–100.0)
Monocytes Absolute: 0.4 10*3/uL (ref 0.1–1.0)
Monocytes Relative: 10 %
Neutro Abs: 1.8 10*3/uL (ref 1.7–7.7)
Neutrophils Relative %: 50 %
Platelets: 86 10*3/uL — ABNORMAL LOW (ref 150–400)
RBC: 3.37 MIL/uL — ABNORMAL LOW (ref 4.22–5.81)
RDW: 15.7 % — ABNORMAL HIGH (ref 11.5–15.5)
WBC: 3.7 10*3/uL — ABNORMAL LOW (ref 4.0–10.5)
nRBC: 0 % (ref 0.0–0.2)

## 2019-05-13 LAB — TSH: TSH: 2.902 u[IU]/mL (ref 0.350–4.500)

## 2019-05-13 LAB — IRON AND TIBC
Iron: 121 ug/dL (ref 45–182)
Saturation Ratios: 76 % — ABNORMAL HIGH (ref 17.9–39.5)
TIBC: 159 ug/dL — ABNORMAL LOW (ref 250–450)
UIBC: 38 ug/dL

## 2019-05-13 LAB — FERRITIN: Ferritin: 1281 ng/mL — ABNORMAL HIGH (ref 24–336)

## 2019-05-14 ENCOUNTER — Other Ambulatory Visit: Payer: Self-pay | Admitting: Hematology and Oncology

## 2019-05-14 DIAGNOSIS — R7989 Other specified abnormal findings of blood chemistry: Secondary | ICD-10-CM

## 2019-05-15 NOTE — Progress Notes (Signed)
[facility]  [address], [address] [place], [place] [address] Phone: [phone]  Fax: [phone]    Telemedicine Office Visit:  05/16/2019  Referring physician: Sharyne Peach, MD  I connected with Lawrence [name] on 05/16/2019 at 4:05 PM by telephone and verified that I was speaking with the correct person using 2 identifiers.  The patient was at [facility].  I discussed the limitations, risk, security and privacy concerns of performing an evaluation and management service by telephone and the availability of in person appointments.  I also discussed with the patient that there may be a patient responsible charge related to this service.  The patient expressed understanding and agreed to proceed.   Chief Complaint: Lawrence [name] is a 63 y.o. male with end-stage renal disease on hemodialysis with thrombocytopenia and anemia who is seen for 1 month assessment.   HPI: The patient was last seen in the oncology clinic on 04/13/2019 via telemedicine. At that time, he denied any complaints.  He denied any bruising or bleeding.  He had no B symptoms. He discontinued oral iron.  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  Repeat bone marrow was considered.  Bone marrow report from 02/23/2015 was obtained.  Bone marrow was hemodilute.  Flow cytometry revealed no significant immunophenotyic abnormalities.  Cytogenetics were normal, 46, XY.  Labs on 05/13/2019: WBC 3,700, hemoglobin 10.5, hematocrit 32.9, platelets 86,000. TSH 2.902. Copper 111. Iron saturation 76%, ferritin 1,281.   During the interim, the patient reports he is "fine". His caregiver Alfreda denies any changes. He denies any bone pain. He had a recent urinary infection.    He denies any known family history of hemochromatosis or any blood disorder.    Past Medical History:  Diagnosis Date  . Anemia   . Cerebral hemorrhage ([place]) 2012  . Chronic  constipation   . Chronic kidney disease   . Diabetes mellitus without complication ([place])   . Hemodialysis patient ([place])   . Hyperlipidemia   . Hypertension   . Schizophrenia ([place])   . Stroke [facility])     Past Surgical History:  Procedure Laterality Date  . A/V FISTULAGRAM Left 02/24/2017   Procedure: A/V Fistulagram;  Surgeon: Katha Cabal, MD;  Location: [place] [facility];  Service: Cardiovascular;  Laterality: Left;  . PERIPHERAL VASCULAR CATHETERIZATION N/A 04/17/2015   Procedure: Dialysis/Perma Catheter Insertion;  Surgeon: Katha Cabal, MD;  Location: [place] [facility];  Service: Cardiovascular;  Laterality: N/A;  . PERIPHERAL VASCULAR CATHETERIZATION Left 07/30/2015   Procedure: A/V Shuntogram/Fistulagram;  Surgeon: Algernon Huxley, MD;  Location: [place] [facility];  Service: Cardiovascular;  Laterality: Left;  . PERIPHERAL VASCULAR CATHETERIZATION Left 07/30/2015   Procedure: A/V Shunt Intervention;  Surgeon: Algernon Huxley, MD;  Location: [place] [facility];  Service: Cardiovascular;  Laterality: Left;  . PERIPHERAL VASCULAR CATHETERIZATION N/A 08/20/2015   Procedure: DIALYSIS/PERMA CATHETER REMOVAL;  Surgeon: Algernon Huxley, MD;  Location: [place] [facility];  Service: Cardiovascular;  Laterality: N/A;  . VASCULAR SURGERY     Fistula placement    Family History  Problem Relation Age of Onset  . Diabetes Mother   . Kidney cancer Mother   . Diabetes Sister   . Stroke Brother   . Prostate cancer Neg Hx   . Bladder Cancer Neg Hx     Social History:  reports that he has never smoked. He has never used smokeless tobacco.  He reports that he does not drink alcohol or use drugs. He has been at [facility] since 2014. The patient is accompanied by his caregiver Alfreda today.  Participants in the patient's visit and their role in the encounter included the patient, Alfreda, and Waymon Budge, RN today.  The intake visit was provided by Waymon Budge,  RN.  Allergies:  Allergies  Allergen Reactions  . Nsaids Other (See Comments)    Cannot take due to renal disease    Current Medications: Current Outpatient Medications  Medication Sig Dispense Refill  . acetaminophen (TYLENOL) 500 MG tablet Take 500 mg by mouth every 6 (six) hours as needed.    [name] amLODipine (NORVASC) 2.5 MG tablet Take 2.5 mg 2 (two) times daily by mouth.    [name] aspirin EC 81 MG tablet Take 81 mg by mouth daily.    . benztropine (COGENTIN) 0.5 MG tablet Take 0.5 mg by mouth 2 (two) times daily.    . carvedilol (COREG) 25 MG tablet Take 25 mg by mouth 2 (two) times daily with a meal.     . cetirizine (ZYRTEC) 10 MG tablet Take 10 mg by mouth at bedtime.     . cloNIDine (CATAPRES) 0.3 MG tablet Take 0.3 mg by mouth 2 (two) times daily.    . Coenzyme Q10 100 MG TABS Take 100 mg by mouth at bedtime.    . docusate sodium (COLACE) 100 MG capsule Take 100 mg by mouth 2 (two) times daily.    [name] ENULOSE 10 GM/15ML SOLN 10 g daily.     . fluticasone (FLONASE) 50 MCG/ACT nasal spray Place 2 sprays daily as needed into both nostrils for allergies or rhinitis.    [name] glipiZIDE (GLUCOTROL XL) 5 MG 24 hr tablet Take 2.5 mg by mouth daily with breakfast.     . haloperidol (HALDOL) 5 MG tablet Take 7.5 mg by mouth at bedtime. At bedtime     . hydrALAZINE (APRESOLINE) 25 MG tablet Take 25 mg by mouth 2 (two) times a day.     . lidocaine-prilocaine (EMLA) cream Apply 1 application topically 3 (three) times a week. Apply over dialysis shunt 30 minutes prior to dialysis.    [name] losartan (COZAAR) 100 MG tablet Take 1 tablet (100 mg total) by mouth daily. 30 tablet 0  . Multiple Vitamins-Minerals (MULTIVITAMIN WITH MINERALS) tablet Take 1 tablet by mouth daily.    . niacin (NIASPAN) 1000 MG CR tablet Take 1,000 mg by mouth at bedtime.    . pravastatin (PRAVACHOL) 40 MG tablet Take 40 mg by mouth at bedtime.    . Vitamin D, Ergocalciferol, (DRISDOL) 50000 UNITS CAPS capsule Take 50,000 Units every  30 (thirty) days by mouth.     . ferrous sulfate 325 (65 FE) MG EC tablet Take 1 tablet by mouth daily.     No current facility-administered medications for this visit.     Review of Systems  Constitutional: Negative.  Negative for chills, diaphoresis, fever, malaise/fatigue and weight loss.  HENT: Negative.  Negative for congestion, ear pain, hearing loss, nosebleeds, sinus pain and sore throat.   Eyes: Negative.  Negative for blurred vision and double vision.  Respiratory: Negative.  Negative for cough, hemoptysis, shortness of breath and wheezing.   Cardiovascular: Negative.  Negative for chest pain, palpitations, leg swelling and PND.  Gastrointestinal: Negative.  Negative for abdominal pain, blood in stool, constipation, diarrhea, heartburn, melena, nausea and vomiting.       BMs 2-3x  weekly.  Genitourinary: Negative for dysuria, frequency, hematuria and urgency.       Patient on dialysis.  Little urine output.  Musculoskeletal: Negative.  Negative for back pain, joint pain, myalgias and neck pain.       No bone pain.  Skin: Negative.  Negative for itching and rash.  Neurological: Negative for dizziness, sensory change, weakness and headaches.       Cognitive issues per care provider.  Endo/Heme/Allergies: Negative.  Does not bruise/bleed easily.  Psychiatric/Behavioral: Negative.  Negative for depression and memory loss. The patient is not nervous/anxious and does not have insomnia.   All other systems reviewed and are negative.   Performance status (ECOG):  2   No visits with results within 3 Day(s) from this visit.  Latest known visit with results is:  Appointment on 05/13/2019  Component Date Value Ref Range Status  . TSH 05/13/2019 2.902  0.350 - 4.500 uIU/mL Final   Comment: Performed by a 3rd Generation assay with a functional sensitivity of <=0.01 uIU/mL. Performed at [facility], [address], [place], [place] [address]   . Iron 05/13/2019 121  45 - 182  ug/dL Final  . TIBC 05/13/2019 159* 250 - 450 ug/dL Final  . Saturation Ratios 05/13/2019 76* 17.9 - 39.5 % Final  . UIBC 05/13/2019 38  ug/dL Final   Performed at [facility], [address] [place], [place] [address]  . Ferritin 05/13/2019 1,281* 24 - 336 ng/mL Final   Performed at [facility], [address] [place], [place] [address]  . WBC 05/13/2019 3.7* 4.0 - 10.5 K/uL Final  . RBC 05/13/2019 3.37* 4.22 - 5.81 MIL/uL Final  . Hemoglobin 05/13/2019 10.5* 13.0 - 17.0 g/dL Final  . HCT 05/13/2019 32.9* 39.0 - 52.0 % Final  . MCV 05/13/2019 97.6  80.0 - 100.0 fL Final  . MCH 05/13/2019 31.2  26.0 - 34.0 pg Final  . MCHC 05/13/2019 31.9  30.0 - 36.0 g/dL Final  . RDW 05/13/2019 15.7* 11.5 - 15.5 % Final  . Platelets 05/13/2019 86* 150 - 400 K/uL Final  . nRBC 05/13/2019 0.0  0.0 - 0.2 % Final  . Neutrophils Relative % 05/13/2019 50  % Final  . Neutro Abs 05/13/2019 1.8  1.7 - 7.7 K/uL Final  . Lymphocytes Relative 05/13/2019 36  % Final  . Lymphs Abs 05/13/2019 1.3  0.7 - 4.0 K/uL Final  . Monocytes Relative 05/13/2019 10  % Final  . Monocytes Absolute 05/13/2019 0.4  0.1 - 1.0 K/uL Final  . Eosinophils Relative 05/13/2019 3  % Final  . Eosinophils Absolute 05/13/2019 0.1  0.0 - 0.5 K/uL Final  . Basophils Relative 05/13/2019 1  % Final  . Basophils Absolute 05/13/2019 0.0  0.0 - 0.1 K/uL Final  . Immature Granulocytes 05/13/2019 0  % Final  . Abs Immature Granulocytes 05/13/2019 0.01  0.00 - 0.07 K/uL Final   Performed at [facility], [address] [place], [place] [address]    Assessment:  Lawrence [name] is a 63 y.o. male with a normocytic anemia and thrombocytopenia.  Platelet count dating back to at least 04/16/2015 has fluctuated between 70,000 to 110,000 without trend.  Spleen was normal on 10/13/2017.  Etiology is unclear.  Hydralazine can cause hematologic issues.  He is on no new medications or herbal products.  Bone marrow on 02/23/2015  was available after his appointment.  Bone marrow was hemodilute.  Flow cytometry revealed no significant immunophenotyic abnormalies.  Cytogenetics were normal (46, XY).  Work-up on 11/30/2018 revealed a hematocrit of 28.2, hemoglobin 9.1, MCV 95,6, platelets 103,000, and WBC 3,400 (ANC 1500).  SPEP revealed no monoclonal protein.  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 Kappa light chain were 342.2 (ratio 1.77).  Ferritin was 1161 with an iron saturation 80%.  Normal studies included:  B12, folate, LDH, hepatitis C antibody, and hepatitis surface antigen.  HIV testing was negative on 10/14/2017.  Ferritin has been followed:  1161 on 11/30/2018 and 1281 on 05/13/2019.  Iron saturation was 80% on 11/30/2018 and 76% on 05/13/2019.  He has a history of a mildly elevated PSA.  PSA was 6.09 in 10/2018 and 5.18 on 11/12/2018.  Symptomatically, he denies any concerns.     Plan: 1.   Review labs from 05/13/2019. 2.   Normocytic anemia             Hematocrit 32.9.  Hemoglobin 10.5.  MCV 97.6.             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 3.   Thrombocytopenia             Platelet count 86,000.             Platelet count has fluctuated between 70,000 - 100,000 since 2016.             Patient is on no new medications or herbal products.  None marrow in 2016 was hemodilute.  Anticipate repeat bone marrow if any concerns regarding patient's counts. 4.   Mild leukopenia             WBC 3700.  ANC 1800.             Differential unremarkable.  No recurrent infections.  Continue to monitor. 5.   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   Immunofixation with IgA monoclonal protein with kappa light chain specificity  Continue surveillance every 6 months. 6.   Elevated ferritin and iron saturation             Iron saturation  76% (high).  Ferritin 1281 (high).             Oral iron discontinued.             Discuss hemochromatosis testing..  Discuss plan for phlebotomy if testing is positive.  Discuss with Dr Juleen China.  7.   RTC in 1 month for labs (CBC with diff, FLCA, hemochromatosis assay). 8.   RTC in 2 months for MD assessment and labs (CBC with diff and +/- others).   I discussed the assessment and treatment plan with the patient.  The patient was provided an opportunity to ask questions and all were answered.  The patient agreed with the plan and demonstrated an understanding of the instructions.  The patient was advised to call back or seek an in person evaluation if the symptoms worsen or if the condition fails to improve as anticipated.  I provided 11 minutes (4:05 PM - 4:15 PM) of face-to-face video visit time during this this encounter and > 50% was spent counseling as documented under my assessment and plan.  I provided these services from the [facility] office.   Nolon Stalls, MD, PhD  05/16/2019, 4:05 PM  I, Molly Dorshimer, am acting as scribe for  Melissa C. Mike Gip, MD, PhD.  I, Melissa C. Mike Gip, MD, have reviewed the above documentation for accuracy and completeness, and I agree with the above.

## 2019-05-16 ENCOUNTER — Inpatient Hospital Stay (HOSPITAL_BASED_OUTPATIENT_CLINIC_OR_DEPARTMENT_OTHER): Payer: Medicare Other | Admitting: Hematology and Oncology

## 2019-05-16 ENCOUNTER — Encounter: Payer: Self-pay | Admitting: Hematology and Oncology

## 2019-05-16 ENCOUNTER — Other Ambulatory Visit: Payer: Medicare Other

## 2019-05-16 DIAGNOSIS — D472 Monoclonal gammopathy: Secondary | ICD-10-CM | POA: Diagnosis not present

## 2019-05-16 DIAGNOSIS — N186 End stage renal disease: Secondary | ICD-10-CM | POA: Diagnosis not present

## 2019-05-16 DIAGNOSIS — D696 Thrombocytopenia, unspecified: Secondary | ICD-10-CM | POA: Diagnosis not present

## 2019-05-16 DIAGNOSIS — D631 Anemia in chronic kidney disease: Secondary | ICD-10-CM

## 2019-05-16 DIAGNOSIS — D72819 Decreased white blood cell count, unspecified: Secondary | ICD-10-CM

## 2019-05-16 DIAGNOSIS — R7989 Other specified abnormal findings of blood chemistry: Secondary | ICD-10-CM

## 2019-05-16 NOTE — Progress Notes (Signed)
Confirmed Name, DOB, and Address. Assessment completed with assistance from caregiver, Alfreda. Denies any concerns.

## 2019-05-17 LAB — MULTIPLE MYELOMA PANEL, SERUM
Albumin SerPl Elph-Mcnc: 3.9 g/dL (ref 2.9–4.4)
Albumin/Glob SerPl: 1.3 (ref 0.7–1.7)
Alpha 1: 0.2 g/dL (ref 0.0–0.4)
Alpha2 Glob SerPl Elph-Mcnc: 0.6 g/dL (ref 0.4–1.0)
B-Globulin SerPl Elph-Mcnc: 0.6 g/dL — ABNORMAL LOW (ref 0.7–1.3)
Gamma Glob SerPl Elph-Mcnc: 1.8 g/dL (ref 0.4–1.8)
Globulin, Total: 3.2 g/dL (ref 2.2–3.9)
IgA: 78 mg/dL (ref 61–437)
IgG (Immunoglobin G), Serum: 1924 mg/dL — ABNORMAL HIGH (ref 603–1613)
IgM (Immunoglobulin M), Srm: 35 mg/dL (ref 20–172)
Total Protein ELP: 7.1 g/dL (ref 6.0–8.5)

## 2019-05-17 LAB — COPPER, SERUM: Copper: 111 ug/dL (ref 72–166)

## 2019-05-23 ENCOUNTER — Other Ambulatory Visit: Payer: Medicare Other

## 2019-05-23 ENCOUNTER — Other Ambulatory Visit: Payer: Self-pay

## 2019-05-23 DIAGNOSIS — R972 Elevated prostate specific antigen [PSA]: Secondary | ICD-10-CM

## 2019-05-24 LAB — PSA: Prostate Specific Ag, Serum: 14.8 ng/mL — ABNORMAL HIGH (ref 0.0–4.0)

## 2019-05-25 ENCOUNTER — Ambulatory Visit: Payer: Self-pay | Admitting: Urology

## 2019-05-26 ENCOUNTER — Telehealth: Payer: Medicare Other | Admitting: Urology

## 2019-06-13 ENCOUNTER — Other Ambulatory Visit: Payer: Medicare Other

## 2019-06-15 ENCOUNTER — Other Ambulatory Visit: Payer: Self-pay

## 2019-06-15 ENCOUNTER — Telehealth: Payer: Medicare Other | Admitting: Urology

## 2019-06-17 ENCOUNTER — Ambulatory Visit (INDEPENDENT_AMBULATORY_CARE_PROVIDER_SITE_OTHER): Payer: Medicare Other | Admitting: Vascular Surgery

## 2019-06-17 ENCOUNTER — Encounter (INDEPENDENT_AMBULATORY_CARE_PROVIDER_SITE_OTHER): Payer: Medicare Other

## 2019-06-23 NOTE — Progress Notes (Signed)
I try contact this patient both by telephone as well as [email or web address].  I was unable to reach him for his virtual visit.  He may be better served by being seen in person.  I believe he prefers [name].  Please schedule a follow-up with me in my been at my earliest availability.  Hollice Espy, MD

## 2019-06-28 DIAGNOSIS — N2581 Secondary hyperparathyroidism of renal origin: Secondary | ICD-10-CM | POA: Insufficient documentation

## 2019-07-11 ENCOUNTER — Inpatient Hospital Stay: Payer: Medicare Other | Admitting: Hematology and Oncology

## 2019-07-11 ENCOUNTER — Inpatient Hospital Stay: Payer: Medicare Other

## 2019-07-11 NOTE — Progress Notes (Signed)
[facility]  [address], [address] [place], [place] [address] Phone: [phone]  Fax: [phone]   Telephone Visit:  07/13/2019  Referring physician: Sharyne Peach, MD  I connected with Lawrence [name] on 07/13/2019 at 1:48 PM by telephone and verified that I was speaking with the correct person using 2 identifiers.  The patient was at his group home.  I discussed the limitations, risk, security and privacy concerns of performing an evaluation and management service by telephone and the availability of in person appointments.  I also discussed with the patient that there may be a patient responsible charge related to this service.  The patient expressed understanding and agreed to proceed.   Chief Complaint: Lawrence [name] is a 63 y.o. male withend-stage renal disease on hemodialysiswiththrombocytopeniaand anemiawho is seen for 2 month assessment.  HPI: The patient was last seen in the oncology clinic via telephone on 05/16/2019. At that time, he denied any concerns.  Hematocrit was 32.9, hemoglobin 10.5, platelets 86,000, WBC 3700 with an ANC of 1800. Ferritin was 1281 with an iron saturation of 76%. TSH and copper were normal.  PSA on 05/23/2019: 14.8.  During the interim, the patient has been feeling "pretty good." He denies any fevers or infections. His weight is stable. She denies any headaches, rhinorrhea, cough, chest pain, or shortness of breath. He denies any nausea, vomiting, or diarrhea. He denies any bone pain. He denies any cognitive issues, numbness, weakness, or coordination issues.   He has had one blood transfusion in his lifetime when he had fistula surgery. He is followed in nephrology by Dr. Juleen China. He receives dialysis at the [facility] in [place].    Past Medical History:  Diagnosis Date  . Anemia   . Cerebral hemorrhage ([place]) 2012  . Chronic constipation   . Chronic kidney disease   . Diabetes mellitus without  complication ([place])   . Hemodialysis patient ([place])   . Hyperlipidemia   . Hypertension   . Schizophrenia ([place])   . Stroke [facility])     Past Surgical History:  Procedure Laterality Date  . A/V FISTULAGRAM Left 02/24/2017   Procedure: A/V Fistulagram;  Surgeon: Katha Cabal, MD;  Location: [place] CV LAB;  Service: Cardiovascular;  Laterality: Left;  . PERIPHERAL VASCULAR CATHETERIZATION N/A 04/17/2015   Procedure: Dialysis/Perma Catheter Insertion;  Surgeon: Katha Cabal, MD;  Location: [place] [facility];  Service: Cardiovascular;  Laterality: N/A;  . PERIPHERAL VASCULAR CATHETERIZATION Left 07/30/2015   Procedure: A/V Shuntogram/Fistulagram;  Surgeon: Algernon Huxley, MD;  Location: [place] CV LAB;  Service: Cardiovascular;  Laterality: Left;  . PERIPHERAL VASCULAR CATHETERIZATION Left 07/30/2015   Procedure: A/V Shunt Intervention;  Surgeon: Algernon Huxley, MD;  Location: [place] CV LAB;  Service: Cardiovascular;  Laterality: Left;  . PERIPHERAL VASCULAR CATHETERIZATION N/A 08/20/2015   Procedure: DIALYSIS/PERMA CATHETER REMOVAL;  Surgeon: Algernon Huxley, MD;  Location: [place] CV LAB;  Service: Cardiovascular;  Laterality: N/A;  . VASCULAR SURGERY     Fistula placement    Family History  Problem Relation Age of Onset  . Diabetes Mother   . Kidney cancer Mother   . Diabetes Sister   . Stroke Brother   . Prostate cancer Neg Hx   . Bladder Cancer Neg Hx     Social History:  reports that he has never smoked. He has never used smokeless tobacco. He reports that he does not drink alcohol or use drugs.[facility] has been  at [facility] since [facility].The patient is accompanied byhis caregiver Alfredatoday.  Participants in the patient's visit and their role in the encounter included the patient, his caregiver Lawrence [name], and Vito Berger, CMA, today.  The intake visit was provided by Vito Berger, CMA.  Allergies:  Allergies  Allergen Reactions  .  Nsaids Other (See Comments)    Cannot take due to renal disease    Current Medications: Current Outpatient Medications  Medication Sig Dispense Refill  . acetaminophen (TYLENOL) 500 MG tablet Take 500 mg by mouth every 6 (six) hours as needed.    [name] amLODipine (NORVASC) 2.5 MG tablet Take 2.5 mg 2 (two) times daily by mouth.    [name] aspirin EC 81 MG tablet Take 81 mg by mouth daily.    . benztropine (COGENTIN) 0.5 MG tablet Take 0.5 mg by mouth 2 (two) times daily.    . carvedilol (COREG) 25 MG tablet Take 25 mg by mouth 2 (two) times daily with a meal.     . cetirizine (ZYRTEC) 10 MG tablet Take 10 mg by mouth at bedtime.     . cloNIDine (CATAPRES) 0.3 MG tablet Take 0.3 mg by mouth 2 (two) times daily.    . Coenzyme Q10 100 MG TABS Take 100 mg by mouth at bedtime.    . docusate sodium (COLACE) 100 MG capsule Take 100 mg by mouth 2 (two) times daily.    [name] ENULOSE 10 GM/15ML SOLN 10 g daily.     . ferrous sulfate 325 (65 FE) MG EC tablet Take 1 tablet by mouth daily.    . fluticasone (FLONASE) 50 MCG/ACT nasal spray Place 2 sprays daily as needed into both nostrils for allergies or rhinitis.    [name] glipiZIDE (GLUCOTROL XL) 5 MG 24 hr tablet Take 2.5 mg by mouth daily with breakfast.     . haloperidol (HALDOL) 5 MG tablet Take 7.5 mg by mouth at bedtime. At bedtime     . hydrALAZINE (APRESOLINE) 25 MG tablet Take 25 mg by mouth 2 (two) times a day.     . losartan (COZAAR) 100 MG tablet Take 1 tablet (100 mg total) by mouth daily. 30 tablet 0  . Multiple Vitamins-Minerals (MULTIVITAMIN WITH MINERALS) tablet Take 1 tablet by mouth daily.    . niacin (NIASPAN) 1000 MG CR tablet Take 1,000 mg by mouth at bedtime.    . pravastatin (PRAVACHOL) 40 MG tablet Take 40 mg by mouth at bedtime.    . Vitamin D, Ergocalciferol, (DRISDOL) 50000 UNITS CAPS capsule Take 50,000 Units every 30 (thirty) days by mouth.     . lidocaine-prilocaine (EMLA) cream Apply 1 application topically 3 (three) times a week. Apply  over dialysis shunt 30 minutes prior to dialysis.     No current facility-administered medications for this visit.     Review of Systems  Constitutional: Negative.  Negative for chills, diaphoresis, fever, malaise/fatigue and weight loss (stable).       Feels "pretty good."  HENT: Negative.  Negative for congestion, ear pain, hearing loss, nosebleeds, sinus pain and sore throat.   Eyes: Negative.  Negative for blurred vision and double vision.  Respiratory: Negative.  Negative for cough, hemoptysis, shortness of breath and wheezing.   Cardiovascular: Negative.  Negative for chest pain, palpitations, leg swelling and PND.  Gastrointestinal: Negative.  Negative for abdominal pain, blood in stool, constipation, diarrhea, heartburn, melena, nausea and vomiting.       BMs 2-3x weekly.  Genitourinary: Negative for  dysuria, frequency, hematuria and urgency.       End stage renal disease on dialysis.  Little urine output.  Musculoskeletal: Negative.  Negative for back pain, joint pain, myalgias and neck pain.       No bone pain.  Skin: Negative.  Negative for itching and rash.  Neurological: Negative for dizziness, sensory change, weakness and headaches.       Cognitive issues per care provider.  Endo/Heme/Allergies: Negative.  Does not bruise/bleed easily.  Psychiatric/Behavioral: Negative.  Negative for depression and memory loss. The patient is not nervous/anxious and does not have insomnia.   All other systems reviewed and are negative.   Performance status (ECOG):  2  No visits with results within 3 Day(s) from this visit.  Latest known visit with results is:  Appointment on 05/23/2019  Component Date Value Ref Range Status  . Prostate Specific Ag, Serum 05/23/2019 14.8* 0.0 - 4.0 ng/mL Final   Comment: Roche ECLIA methodology. According to the American Urological Association, Serum PSA should decrease and remain at undetectable levels after radical prostatectomy. The AUA defines  biochemical recurrence as an initial PSA value 0.2 ng/mL or greater followed by a subsequent confirmatory PSA value 0.2 ng/mL or greater. Values obtained with different assay methods or kits cannot be used interchangeably. Results cannot be interpreted as absolute evidence of the presence or absence of malignant disease.     Assessment:  Lawrence [name] is a 63 y.o. male with a normocyticanemiaandthrombocytopenia.Platelet countdating back to at least 04/16/2015 has fluctuated between 70,000 to 110,000 without trend.Spleen was normal on 10/13/2017. Etiology is unclear. Hydralazine can cause hematologic issues. He is on no new medications or herbal products.  Bone marrowon 02/23/2015 was available after his appointment. Bone marrow was hemodilute. Flow cytometry revealed no significant immunophenotyic abnormalies. Cytogenetics were normal (46, XY).  Work-upon 12/24/2019revealed a hematocrit of 28.2, hemoglobin 9.1, MCV 95,6, platelets 103,000, andWBC 3,400 (ANC 1500).SPEPrevealed no monoclonal protein. 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 Kappa light chainwere342.2(ratio 1.77).Ferritinwas 1161 with an iron saturation 80%.Normal studiesincluded: B12, folate,LDH, hepatitis C antibody, and hepatitissurface antigen.HIV testing was negative on 10/14/2017.  SPEP has been followed (g/dL): 0 on 11/30/2018 and 0 on 05/13/2019. Kappa free light chain assay (FLC) has been followed: 324.2 (ratio 1.77) on 11/30/2018.  Ferritin has been followed:  1161 on 11/30/2018 and 1281 on 05/13/2019.  Iron saturation was 80% on 11/30/2018 and 76% on 05/13/2019.  He has end stage renal disease. He is on dialysis Tuesday, Thursday, and Saturday weekly. Creatinine on 04/11/2019 was 10.04.  He has a history of a mildly elevated PSA. PSA was 6.09 in 10/2018, 5.18on 11/12/2018, and 14.8 on 05/23/2019.  Symptomatically, he feels pretty  good.  He denies any concerns.  He continues dialysis.  Plan: 1.   Review labs from 05/23/2019- PSA.  2. Normocytic anemia No new labs since 05/13/2019.  At that time, hemoglobin 10.5. MCV 97.6. Patient has anemia of chronic renal disease.  Suspect patient receiving Reacrit/Procrit with dialysis. 3. Thrombocytopenia No new labs since 05/13/2019.  Platelet count was 86,000 at that time. Platelet count has fluctuated between 70,000 - 100,000 since 2016. Patient denies any new medications or herbal products.  Patient denies any history of hepatitis.  Review bone marrow from 2016 was hemodilute.             Discuss plans to repeat bone marrow if any unexplained drop in counts. 4. Mild leukopenia No new labs since 05/13/2019.  WBC  3700.  ANC 1800 at hat time. Request diff with next labs.  Continue to monitor. 5. 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 6. Elevated ferritin and iron saturation Prior labs included an iron saturation 76% (high) and ferritin 1281 (high). Patient is not on oral iron. Evaluate for hemochromatosis. 7.   Elevated PSA  Facilitate appointment with Dr Erlene Quan. 8.   FAX lab order to his dialysis center [facility]) in [place].  CBC with diff, ferritin, iron studies, FLCA, hemochromatosis assay. 9.   RTC in 3 months for MD assessment and labs (CBC with diff, SPEP, FLCA).  I discussed the assessment and treatment plan with the patient.  The patient was provided an opportunity to ask questions and all were answered.  The patient agreed with the plan and demonstrated an  understanding of the instructions.  The patient was advised to call back or seek an in person evaluation if the symptoms worsen or if the condition fails to improve as anticipated.  I provided 11 minutes (1:48 PM - 1:58 PM) of non face-to-face telephone visit time during this this encounter and > 50% was spent counseling as documented under my assessment and plan.  I provided these services from the [facility] office .   Nolon Stalls, MD, PhD  07/13/2019, 1:48 PM  I, Molly Dorshimer, am acting as Education administrator for [employer]. Mike Gip, MD, PhD.  I,  C. Mike Gip, MD, have reviewed the above documentation for accuracy and completeness, and I agree with the above.

## 2019-07-12 ENCOUNTER — Other Ambulatory Visit: Payer: Self-pay

## 2019-07-13 ENCOUNTER — Encounter: Payer: Self-pay | Admitting: Hematology and Oncology

## 2019-07-13 ENCOUNTER — Inpatient Hospital Stay: Payer: Medicare Other | Attending: Hematology and Oncology | Admitting: Hematology and Oncology

## 2019-07-13 ENCOUNTER — Other Ambulatory Visit: Payer: Self-pay | Admitting: Hematology and Oncology

## 2019-07-13 DIAGNOSIS — R7989 Other specified abnormal findings of blood chemistry: Secondary | ICD-10-CM

## 2019-07-13 DIAGNOSIS — N186 End stage renal disease: Secondary | ICD-10-CM | POA: Diagnosis not present

## 2019-07-13 DIAGNOSIS — D72819 Decreased white blood cell count, unspecified: Secondary | ICD-10-CM

## 2019-07-13 DIAGNOSIS — D472 Monoclonal gammopathy: Secondary | ICD-10-CM

## 2019-07-13 DIAGNOSIS — D631 Anemia in chronic kidney disease: Secondary | ICD-10-CM

## 2019-07-13 DIAGNOSIS — D696 Thrombocytopenia, unspecified: Secondary | ICD-10-CM

## 2019-07-13 DIAGNOSIS — Z992 Dependence on renal dialysis: Secondary | ICD-10-CM

## 2019-07-13 NOTE — Progress Notes (Signed)
No new changes noted today. The patient name and DOB has been verified by phone today. 

## 2019-08-09 ENCOUNTER — Other Ambulatory Visit (INDEPENDENT_AMBULATORY_CARE_PROVIDER_SITE_OTHER): Payer: Self-pay | Admitting: Vascular Surgery

## 2019-08-09 DIAGNOSIS — N186 End stage renal disease: Secondary | ICD-10-CM

## 2019-08-10 ENCOUNTER — Telehealth: Payer: Medicare Other | Admitting: Urology

## 2019-08-12 ENCOUNTER — Ambulatory Visit (INDEPENDENT_AMBULATORY_CARE_PROVIDER_SITE_OTHER): Payer: Medicare Other

## 2019-08-12 ENCOUNTER — Ambulatory Visit (INDEPENDENT_AMBULATORY_CARE_PROVIDER_SITE_OTHER): Payer: Medicare Other | Admitting: Vascular Surgery

## 2019-08-12 ENCOUNTER — Encounter (INDEPENDENT_AMBULATORY_CARE_PROVIDER_SITE_OTHER): Payer: Self-pay | Admitting: Vascular Surgery

## 2019-08-12 ENCOUNTER — Telehealth: Payer: Medicare Other | Admitting: Urology

## 2019-08-12 ENCOUNTER — Other Ambulatory Visit: Payer: Self-pay

## 2019-08-12 VITALS — BP 84/57 | HR 80 | Resp 10 | Ht 67.0 in | Wt 169.0 lb

## 2019-08-12 DIAGNOSIS — Z992 Dependence on renal dialysis: Secondary | ICD-10-CM

## 2019-08-12 DIAGNOSIS — E1122 Type 2 diabetes mellitus with diabetic chronic kidney disease: Secondary | ICD-10-CM | POA: Diagnosis not present

## 2019-08-12 DIAGNOSIS — N186 End stage renal disease: Secondary | ICD-10-CM

## 2019-08-12 DIAGNOSIS — I10 Essential (primary) hypertension: Secondary | ICD-10-CM

## 2019-08-12 NOTE — Assessment & Plan Note (Signed)
Likely an underlying cause of his renal failure and blood glucose control important in reducing the progression of atherosclerotic disease. Also, involved in wound healing. On appropriate medications.  

## 2019-08-12 NOTE — Assessment & Plan Note (Signed)
blood pressure control important in reducing the progression of atherosclerotic disease and aneurysmal growth. On appropriate oral medications.  

## 2019-08-12 NOTE — Progress Notes (Signed)
MRN : [id number]  Lawrence [name] is a 63 y.o. ([birth year]) male who presents with chief complaint of  Chief Complaint  Patient presents with  . Follow-up  .  History of Present Illness: Patient returns today in follow up of his dialysis access.  He has an aneurysmal left brachiocephalic AV fistula which has been used for many years for his dialysis.  No recent interventions on this.  No problems on dialysis such as prolonged bleeding, pain, or difficulty with access. Duplex today shows a widely patent left brachiocephalic AV fistula with significant aneurysmal dilatation but no stenosis.   Current Outpatient Medications  Medication Sig Dispense Refill  . acetaminophen (TYLENOL) 500 MG tablet Take 500 mg by mouth every 6 (six) hours as needed.    [name] amLODipine (NORVASC) 2.5 MG tablet Take 2.5 mg 2 (two) times daily by mouth.    [name] aspirin EC 81 MG tablet Take 81 mg by mouth daily.    . benztropine (COGENTIN) 0.5 MG tablet Take 0.5 mg by mouth 2 (two) times daily.    . carvedilol (COREG) 25 MG tablet Take 25 mg by mouth 2 (two) times daily with a meal.     . cetirizine (ZYRTEC) 10 MG tablet Take 10 mg by mouth at bedtime.     . cloNIDine (CATAPRES) 0.3 MG tablet Take 0.3 mg by mouth 2 (two) times daily.    . Coenzyme Q10 100 MG TABS Take 100 mg by mouth at bedtime.    . docusate sodium (COLACE) 100 MG capsule Take 100 mg by mouth 2 (two) times daily.    [name] ENULOSE 10 GM/15ML SOLN 10 g daily.     . ferrous sulfate 325 (65 FE) MG EC tablet Take 1 tablet by mouth daily.    . fluticasone (FLONASE) 50 MCG/ACT nasal spray Place 2 sprays daily as needed into both nostrils for allergies or rhinitis.    [name] glipiZIDE (GLUCOTROL XL) 5 MG 24 hr tablet Take 2.5 mg by mouth daily with breakfast.     . haloperidol (HALDOL) 5 MG tablet Take 7.5 mg by mouth at bedtime. At bedtime     . hydrALAZINE (APRESOLINE) 25 MG tablet Take 25 mg by mouth 2 (two) times a day.     . lidocaine-prilocaine (EMLA) cream Apply  1 application topically 3 (three) times a week. Apply over dialysis shunt 30 minutes prior to dialysis.    [name] losartan (COZAAR) 100 MG tablet Take 1 tablet (100 mg total) by mouth daily. 30 tablet 0  . Multiple Vitamins-Minerals (MULTIVITAMIN WITH MINERALS) tablet Take 1 tablet by mouth daily.    . niacin (NIASPAN) 1000 MG CR tablet Take 1,000 mg by mouth at bedtime.    . pravastatin (PRAVACHOL) 40 MG tablet Take 40 mg by mouth at bedtime.    . Vitamin D, Ergocalciferol, (DRISDOL) 50000 UNITS CAPS capsule Take 50,000 Units every 30 (thirty) days by mouth.      No current facility-administered medications for this visit.     Past Medical History:  Diagnosis Date  . Anemia   . Cerebral hemorrhage ([place]) 2012  . Chronic constipation   . Chronic kidney disease   . Diabetes mellitus without complication ([place])   . Hemodialysis patient ([place])   . Hyperlipidemia   . Hypertension   . Schizophrenia ([place])   . Stroke [facility])     Past Surgical History:  Procedure Laterality Date  . A/V FISTULAGRAM Left 02/24/2017   Procedure: A/V Fistulagram;  Surgeon:  Katha Cabal, MD;  Location: [place] CV LAB;  Service: Cardiovascular;  Laterality: Left;  . PERIPHERAL VASCULAR CATHETERIZATION N/A 04/17/2015   Procedure: Dialysis/Perma Catheter Insertion;  Surgeon: Katha Cabal, MD;  Location: [place] [facility] LAB;  Service: Cardiovascular;  Laterality: N/A;  . PERIPHERAL VASCULAR CATHETERIZATION Left 07/30/2015   Procedure: A/V Shuntogram/Fistulagram;  Surgeon: Algernon Huxley, MD;  Location: [place] CV LAB;  Service: Cardiovascular;  Laterality: Left;  . PERIPHERAL VASCULAR CATHETERIZATION Left 07/30/2015   Procedure: A/V Shunt Intervention;  Surgeon: Algernon Huxley, MD;  Location: [place] CV LAB;  Service: Cardiovascular;  Laterality: Left;  . PERIPHERAL VASCULAR CATHETERIZATION N/A 08/20/2015   Procedure: DIALYSIS/PERMA CATHETER REMOVAL;  Surgeon: Algernon Huxley, MD;  Location: [place] CV  LAB;  Service: Cardiovascular;  Laterality: N/A;  . VASCULAR SURGERY     Fistula placement    Social History Social History   Tobacco Use  . Smoking status: Never Smoker  . Smokeless tobacco: Never Used  Substance Use Topics  . Alcohol use: No  . Drug use: No     Family History Family History  Problem Relation Age of Onset  . Diabetes Mother   . Kidney cancer Mother   . Diabetes Sister   . Stroke Brother   . Prostate cancer Neg Hx   . Bladder Cancer Neg Hx      Allergies  Allergen Reactions  . Nsaids Other (See Comments)    Cannot take due to renal disease     REVIEW OF SYSTEMS (Negative unless checked)  Constitutional: [] Weight loss  [] Fever  [] Chills Cardiac: [] Chest pain   [] Chest pressure   [] Palpitations   [] Shortness of breath when laying flat   [] Shortness of breath at rest   [] Shortness of breath with exertion. Vascular:  [] Pain in legs with walking   [] Pain in legs at rest   [] Pain in legs when laying flat   [] Claudication   [] Pain in feet when walking  [] Pain in feet at rest  [] Pain in feet when laying flat   [] History of DVT   [] Phlebitis   [] Swelling in legs   [] Varicose veins   [] Non-healing ulcers Pulmonary:   [] Uses home oxygen   [] Productive cough   [] Hemoptysis   [] Wheeze  [] COPD   [] Asthma Neurologic:  [] Dizziness  [] Blackouts   [] Seizures   [] History of stroke   [] History of TIA  [] Aphasia   [] Temporary blindness   [] Dysphagia   [] Weakness or numbness in arms   [] Weakness or numbness in legs Musculoskeletal:  [x] Arthritis   [] Joint swelling   [] Joint pain   [] Low back pain Hematologic:  [] Easy bruising  [] Easy bleeding   [] Hypercoagulable state   [x] Anemic   Gastrointestinal:  [] Blood in stool   [] Vomiting blood  [x] Gastroesophageal reflux/heartburn   [] Abdominal pain Genitourinary:  [x] Chronic kidney disease   [] Difficult urination  [] Frequent urination  [] Burning with urination   [] Hematuria Skin:  [] Rashes   [] Ulcers   [] Wounds Psychological:   [x] History of anxiety   [x]  History of major depression.  Physical Examination  BP (!) 84/57 (BP Location: Right Arm, Patient Position: Sitting, Cuff Size: Normal)   Pulse 80   Resp 10   Ht 5\' 7"  (1.702 m)   Wt 169 lb (76.7 kg)   BMI 26.47 kg/m  Gen:  WD/WN, NAD Head: [place]/AT, No temporalis wasting. Ear/Nose/Throat: Hearing grossly intact, nares w/o erythema or drainage Eyes: Conjunctiva clear. Sclera non-icteric Neck: Supple.  Trachea midline Pulmonary:  Good air movement, no use of accessory muscles.  Cardiac: RRR, no JVD Vascular: Aneurysmal left brachiocephalic AV fistula with excellent thrill Vessel Right Left  Radial Palpable Palpable                       Musculoskeletal: M/S 5/5 throughout.  No deformity or atrophy.  Neurologic: Sensation grossly intact in extremities.  Symmetrical.  Speech is fluent.  Psychiatric: Judgment and insight seem OK, flat affect Dermatologic: No rashes or ulcers noted.  No cellulitis or open wounds.       Labs Recent Results (from the past 2160 hour(s))  PSA     Status: Abnormal   Collection Time: 05/23/19 11:48 AM  Result Value Ref Range   Prostate Specific Ag, Serum 14.8 (H) 0.0 - 4.0 ng/mL    Comment: Roche ECLIA methodology. According to the American Urological Association, Serum PSA should decrease and remain at undetectable levels after radical prostatectomy. The AUA defines biochemical recurrence as an initial PSA value 0.2 ng/mL or greater followed by a subsequent confirmatory PSA value 0.2 ng/mL or greater. Values obtained with different assay methods or kits cannot be used interchangeably. Results cannot be interpreted as absolute evidence of the presence or absence of malignant disease.     Radiology No results found.  Assessment/Plan  HTN (hypertension) blood pressure control important in reducing the progression of atherosclerotic disease and aneurysmal growth. On appropriate oral medications.   ESRD on  dialysis [facility]) Duplex today shows a widely patent left brachiocephalic AV fistula with significant aneurysmal dilatation but no stenosis.  Rotating the access sites would help avoid skin breakdown but otherwise no intervention is necessary on the fistula.  Recheck in 1 year.  Type 2 diabetes mellitus (HCC) Likely an underlying cause of his renal failure and blood glucose control important in reducing the progression of atherosclerotic disease. Also, involved in wound healing. On appropriate medications.     [name], MD  08/12/2019 11:39 AM    This note was created with Dragon medical transcription system.  Any errors from dictation are purely unintentional

## 2019-08-12 NOTE — Assessment & Plan Note (Signed)
Duplex today shows a widely patent left brachiocephalic AV fistula with significant aneurysmal dilatation but no stenosis.  Rotating the access sites would help avoid skin breakdown but otherwise no intervention is necessary on the fistula.  Recheck in 1 year.

## 2019-08-23 ENCOUNTER — Other Ambulatory Visit: Payer: Self-pay

## 2019-08-23 DIAGNOSIS — R972 Elevated prostate specific antigen [PSA]: Secondary | ICD-10-CM

## 2019-08-26 ENCOUNTER — Encounter: Payer: Self-pay | Admitting: Urology

## 2019-08-26 ENCOUNTER — Other Ambulatory Visit
Admission: RE | Admit: 2019-08-26 | Discharge: 2019-08-26 | Disposition: A | Discharge status: Home / Self Care | Payer: Medicare Other | Attending: Urology | Admitting: Urology

## 2019-08-26 ENCOUNTER — Ambulatory Visit (INDEPENDENT_AMBULATORY_CARE_PROVIDER_SITE_OTHER): Payer: Medicare Other | Admitting: Urology

## 2019-08-26 ENCOUNTER — Other Ambulatory Visit: Payer: Self-pay

## 2019-08-26 VITALS — BP 111/78 | HR 111 | Ht 66.0 in | Wt 149.0 lb

## 2019-08-26 DIAGNOSIS — R972 Elevated prostate specific antigen [PSA]: Secondary | ICD-10-CM

## 2019-08-26 NOTE — Progress Notes (Signed)
08/26/2019 2:52 PM   [name] July 23, 1956 [id number]  Referring provider: Sharyne Peach, MD [place],  [place] [address]  Chief Complaint  Patient presents with  . Elevated PSA    follow up    HPI: 63 year old male with end-stage renal disease on hemodialysis, history of stroke and schizophrenia who presents today with his caretaker to discuss persistent rise in his PSA.  PSA was drawn again today and is pending.  Notably, he was recently seen in medical oncology at which time his PSA was noted to continue to rise, up to 14.8.  He is now voiding minimal to no urine.  He occasionally will have a bladder spasm and pain at the tip of his penis as previously discussed without voiding.  Were unable to collect a UA urine culture due to the low volume of urine.  Rectal exam 12/19 with small prostate, nontender with no nodules.  PSA trend:  2.83 on 04/19/2013  3.2 on 03/05/2016 3.9 on 04/09/16  7.91 on 03/27/2017  4.1 on 05/01/2017 5.15 on 10/07/2017 6.09 on 10/27/2018  5.18 on 04/12/2018 14.8 on 05/2019   PMH: Past Medical History:  Diagnosis Date  . Anemia   . Cerebral hemorrhage ([place]) 2012  . Chronic constipation   . Chronic kidney disease   . Diabetes mellitus without complication ([place])   . Hemodialysis patient ([place])   . Hyperlipidemia   . Hypertension   . Schizophrenia ([place])   . Stroke [facility])     Surgical History: Past Surgical History:  Procedure Laterality Date  . A/V FISTULAGRAM Left 02/24/2017   Procedure: A/V Fistulagram;  Surgeon: Katha Cabal, MD;  Location: [place] CV [facility];  Service: Cardiovascular;  Laterality: Left;  . PERIPHERAL VASCULAR CATHETERIZATION N/A 04/17/2015   Procedure: Dialysis/Perma Catheter Insertion;  Surgeon: Katha Cabal, MD;  Location: [place] [facility];  Service: Cardiovascular;  Laterality: N/A;  . PERIPHERAL VASCULAR CATHETERIZATION Left 07/30/2015   Procedure: A/V Shuntogram/Fistulagram;   Surgeon: Algernon Huxley, MD;  Location: [place] [facility];  Service: Cardiovascular;  Laterality: Left;  . PERIPHERAL VASCULAR CATHETERIZATION Left 07/30/2015   Procedure: A/V Shunt Intervention;  Surgeon: Algernon Huxley, MD;  Location: [place] CV [facility];  Service: Cardiovascular;  Laterality: Left;  . PERIPHERAL VASCULAR CATHETERIZATION N/A 08/20/2015   Procedure: DIALYSIS/PERMA CATHETER REMOVAL;  Surgeon: Algernon Huxley, MD;  Location: [place] CV LAB;  Service: Cardiovascular;  Laterality: N/A;  . VASCULAR SURGERY     Fistula placement    Home Medications:  Allergies as of 08/26/2019      Reactions   Nsaids Other (See Comments)   Cannot take due to renal disease      Medication List       Accurate as of August 26, 2019 11:59 PM. If you have any questions, ask your nurse or doctor.        STOP taking these medications   ferrous sulfate 325 (65 FE) MG EC tablet Stopped by: Hollice Espy, MD     TAKE these medications   acetaminophen 500 MG tablet Commonly known as: TYLENOL Take 500 mg by mouth every 6 (six) hours as needed.   amLODipine 2.5 MG tablet Commonly known as: NORVASC Take 2.5 mg 2 (two) times daily by mouth.   aspirin EC 81 MG tablet Take 81 mg by mouth daily.   benztropine 0.5 MG tablet Commonly known as: COGENTIN Take 0.5 mg by mouth 2 (two) times daily.  carvedilol 25 MG tablet Commonly known as: COREG Take 25 mg by mouth 2 (two) times daily with a meal.   cetirizine 10 MG tablet Commonly known as: ZYRTEC Take 10 mg by mouth at bedtime.   cloNIDine 0.3 MG tablet Commonly known as: CATAPRES Take 0.3 mg by mouth 2 (two) times daily.   Coenzyme Q10 100 MG Tabs Take 100 mg by mouth at bedtime.   docusate sodium 100 MG capsule Commonly known as: COLACE Take 100 mg by mouth 2 (two) times daily.   Enulose 10 GM/15ML Soln Generic drug: lactulose (encephalopathy) 10 g daily.   fluticasone 50 MCG/ACT nasal spray Commonly known as: FLONASE  Place 2 sprays daily as needed into both nostrils for allergies or rhinitis.   glipiZIDE 5 MG 24 hr tablet Commonly known as: GLUCOTROL XL Take 2.5 mg by mouth daily with breakfast.   haloperidol 5 MG tablet Commonly known as: HALDOL Take 7.5 mg by mouth at bedtime. At bedtime   hydrALAZINE 25 MG tablet Commonly known as: APRESOLINE Take 25 mg by mouth 2 (two) times a day.   lidocaine 4 % cream Commonly known as: LMX Apply 1 application topically as needed.   lidocaine-prilocaine cream Commonly known as: EMLA Apply 1 application topically 3 (three) times a week. Apply over dialysis shunt 30 minutes prior to dialysis.   losartan 100 MG tablet Commonly known as: COZAAR Take 1 tablet (100 mg total) by mouth daily.   multivitamin with minerals tablet Take 1 tablet by mouth daily.   niacin 1000 MG CR tablet Commonly known as: NIASPAN Take 1,000 mg by mouth at bedtime.   pravastatin 40 MG tablet Commonly known as: PRAVACHOL Take 40 mg by mouth at bedtime.   Vitamin D (Ergocalciferol) 1.25 MG (50000 UT) Caps capsule Commonly known as: DRISDOL Take 50,000 Units every 30 (thirty) days by mouth.       Allergies:  Allergies  Allergen Reactions  . Nsaids Other (See Comments)    Cannot take due to renal disease    Family History: Family History  Problem Relation Age of Onset  . Diabetes Mother   . Kidney cancer Mother   . Diabetes Sister   . Stroke Brother   . Prostate cancer Neg Hx   . Bladder Cancer Neg Hx     Social History:  reports that he has never smoked. He has never used smokeless tobacco. He reports that he does not drink alcohol or use drugs.  ROS: UROLOGY Frequent Urination?: No Hard to postpone urination?: No Burning/pain with urination?: Yes Get up at night to urinate?: No Leakage of urine?: No Urine stream starts and stops?: No Trouble starting stream?: Yes Do you have to strain to urinate?: No Blood in urine?: No Urinary tract infection?:  No Sexually transmitted disease?: No Injury to kidneys or bladder?: Yes Painful intercourse?: No Weak stream?: Yes Erection problems?: No Penile pain?: No  Gastrointestinal Nausea?: No Vomiting?: No Indigestion/heartburn?: No Diarrhea?: No Constipation?: No  Constitutional Fever: No Night sweats?: No Weight loss?: No Fatigue?: No  Skin Skin rash/lesions?: No Itching?: No  Eyes Blurred vision?: No Double vision?: No  Ears/Nose/Throat Sore throat?: No Sinus problems?: No  Hematologic/Lymphatic Swollen glands?: No Easy bruising?: No  Cardiovascular Leg swelling?: No Chest pain?: No  Respiratory Cough?: No Shortness of breath?: No  Endocrine Excessive thirst?: No  Musculoskeletal Back pain?: No Joint pain?: No  Neurological Headaches?: No Dizziness?: No  Psychologic Depression?: No Anxiety?: No  Physical Exam: BP 111/78  Pulse (!) 111   Ht 5\' 6"  (1.676 m)   Wt 149 lb (67.6 kg)   BMI 24.05 kg/m   Constitutional:  Alert and oriented, No acute distress.  Accompanied by caregiver today.  Answers most questions.   HEENT: [place] AT, moist mucus membranes.  Trachea midline, no masses. Cardiovascular: No clubbing, cyanosis, or edema. Respiratory: Normal respiratory effort, no increased work of breathing. Rectal: Normal sphincter tone.  Small prostate, unchanged. Skin: No rashes, bruises or suspicious lesions. Neurologic: Grossly intact, no focal deficits, moving all 4 extremities. Psychiatric: Normal mood and affect.  Laboratory Data: Lab Results  Component Value Date   WBC 3.7 (L) 05/13/2019   HGB 10.5 (L) 05/13/2019   HCT 32.9 (L) 05/13/2019   MCV 97.6 05/13/2019   PLT 86 (L) 05/13/2019    Lab Results  Component Value Date   CREATININE 10.04 (H) 04/11/2019   PSA trend as above  Urinalysis    Component Value Date/Time   COLORURINE BROWN (A) 10/13/2017 1703   APPEARANCEUR HAZY (A) 10/13/2017 1703   APPEARANCEUR Clear 12/13/2014 1354    LABSPEC 1.020 10/13/2017 1703   LABSPEC 1.006 12/13/2014 1354   PHURINE 8.5 (H) 10/13/2017 1703   GLUCOSEU 250 (A) 10/13/2017 1703   GLUCOSEU 50 mg/dL 12/13/2014 1354   HGBUR LARGE (A) 10/13/2017 1703   BILIRUBINUR MODERATE (A) 10/13/2017 1703   BILIRUBINUR Negative 12/13/2014 1354   KETONESUR TRACE (A) 10/13/2017 1703   PROTEINUR >300 (A) 10/13/2017 1703   NITRITE POSITIVE (A) 10/13/2017 1703   LEUKOCYTESUR LARGE (A) 10/13/2017 1703   LEUKOCYTESUR Negative 12/13/2014 1354    Lab Results  Component Value Date   BACTERIA MANY (A) 10/13/2017     Assessment & Plan:    1. Elevated PSA Personal history of fluctuating/elevated PSA  Most recent PSA is markedly elevated up to 14.  Unable to collect a UA/urine culture to rule out underlying infection.  Rectal exam today is reassuring.  Recheck PSA today to assess whether or not is trending downward.  Given his multiple medical comorbidities and overall life expectancy, I am very hesitant to pursue prostate biopsy at this time.  We did discuss alternatives today including prostate MRI which she will help assess for any high-grade or locally advanced lesions.  Mr. Arcia is agreeable this plan.  We will have to without contrast given his history of end-stage renal disease.  - PSA; Future  We will call patient/caregiver with follow-up plan based on MRI results.  Hollice Espy, MD  [facility] [address], [place], [place] [address] [phone]

## 2019-08-27 LAB — PSA: Prostatic Specific Antigen: 8.09 ng/mL — ABNORMAL HIGH (ref 0.00–4.00)

## 2019-08-30 ENCOUNTER — Telehealth: Payer: Self-pay

## 2019-08-30 NOTE — Telephone Encounter (Signed)
Called pt's home [facility], spoke with caregiver, who gives verbal understanding to all information given per DPR.

## 2019-08-30 NOTE — Telephone Encounter (Signed)
-----   Message from Hollice Espy, MD sent at 08/29/2019  4:36 PM EDT ----- Please call this patient/his caretaker and let them know that his PSA is actually coming down.  It was 14 when checked at the cancer center is now down to 8.  This is still significantly higher than it was a few years ago.  Recommend pursuing prostate MRI without contrast as previously discussed in the office.  This will give [place] a chance to take a look at the prostate noninvasively.  We will arrange follow-up based on the results.  He should be getting a call from radiology with a time and date.  Hollice Espy, MD

## 2019-10-02 DIAGNOSIS — R34 Anuria and oliguria: Secondary | ICD-10-CM | POA: Insufficient documentation

## 2019-10-02 DIAGNOSIS — L853 Xerosis cutis: Secondary | ICD-10-CM | POA: Insufficient documentation

## 2019-10-19 ENCOUNTER — Other Ambulatory Visit: Payer: Self-pay | Admitting: Hematology and Oncology

## 2019-10-19 ENCOUNTER — Inpatient Hospital Stay: Payer: Medicare Other

## 2019-10-19 ENCOUNTER — Encounter: Payer: Self-pay | Admitting: Hematology and Oncology

## 2019-10-19 ENCOUNTER — Inpatient Hospital Stay: Payer: Medicare Other | Attending: Hematology and Oncology | Admitting: Hematology and Oncology

## 2019-10-19 DIAGNOSIS — D72819 Decreased white blood cell count, unspecified: Secondary | ICD-10-CM

## 2019-10-19 DIAGNOSIS — R7989 Other specified abnormal findings of blood chemistry: Secondary | ICD-10-CM

## 2019-10-19 DIAGNOSIS — D649 Anemia, unspecified: Secondary | ICD-10-CM

## 2019-10-19 DIAGNOSIS — D696 Thrombocytopenia, unspecified: Secondary | ICD-10-CM

## 2019-10-19 DIAGNOSIS — D472 Monoclonal gammopathy: Secondary | ICD-10-CM

## 2020-03-08 ENCOUNTER — Emergency Department
Admission: EM | Admit: 2020-03-08 | Discharge: 2020-03-09 | Disposition: A | Discharge status: Other Acute Inpatient Hospital | Payer: Medicare Other | Attending: Emergency Medicine | Admitting: Emergency Medicine

## 2020-03-08 ENCOUNTER — Encounter: Payer: Self-pay | Admitting: *Deleted

## 2020-03-08 ENCOUNTER — Other Ambulatory Visit: Payer: Self-pay

## 2020-03-08 DIAGNOSIS — F258 Other schizoaffective disorders: Secondary | ICD-10-CM | POA: Diagnosis present

## 2020-03-08 DIAGNOSIS — N186 End stage renal disease: Secondary | ICD-10-CM | POA: Diagnosis not present

## 2020-03-08 DIAGNOSIS — Z992 Dependence on renal dialysis: Secondary | ICD-10-CM | POA: Diagnosis present

## 2020-03-08 DIAGNOSIS — D649 Anemia, unspecified: Secondary | ICD-10-CM | POA: Diagnosis present

## 2020-03-08 DIAGNOSIS — E1122 Type 2 diabetes mellitus with diabetic chronic kidney disease: Secondary | ICD-10-CM | POA: Diagnosis not present

## 2020-03-08 DIAGNOSIS — F259 Schizoaffective disorder, unspecified: Secondary | ICD-10-CM | POA: Diagnosis not present

## 2020-03-08 DIAGNOSIS — I120 Hypertensive chronic kidney disease with stage 5 chronic kidney disease or end stage renal disease: Secondary | ICD-10-CM | POA: Insufficient documentation

## 2020-03-08 DIAGNOSIS — R443 Hallucinations, unspecified: Secondary | ICD-10-CM

## 2020-03-08 DIAGNOSIS — N051 Unspecified nephritic syndrome with focal and segmental glomerular lesions: Secondary | ICD-10-CM | POA: Diagnosis present

## 2020-03-08 DIAGNOSIS — Z7984 Long term (current) use of oral hypoglycemic drugs: Secondary | ICD-10-CM | POA: Diagnosis not present

## 2020-03-08 DIAGNOSIS — Z20822 Contact with and (suspected) exposure to covid-19: Secondary | ICD-10-CM | POA: Diagnosis not present

## 2020-03-08 DIAGNOSIS — I129 Hypertensive chronic kidney disease with stage 1 through stage 4 chronic kidney disease, or unspecified chronic kidney disease: Secondary | ICD-10-CM | POA: Diagnosis present

## 2020-03-08 DIAGNOSIS — R45850 Homicidal ideations: Secondary | ICD-10-CM | POA: Insufficient documentation

## 2020-03-08 DIAGNOSIS — I10 Essential (primary) hypertension: Secondary | ICD-10-CM | POA: Diagnosis present

## 2020-03-08 DIAGNOSIS — N390 Urinary tract infection, site not specified: Secondary | ICD-10-CM | POA: Diagnosis present

## 2020-03-08 DIAGNOSIS — E559 Vitamin D deficiency, unspecified: Secondary | ICD-10-CM | POA: Diagnosis present

## 2020-03-08 DIAGNOSIS — D631 Anemia in chronic kidney disease: Secondary | ICD-10-CM | POA: Diagnosis present

## 2020-03-08 LAB — CBC
HCT: 30.6 % — ABNORMAL LOW (ref 39.0–52.0)
Hemoglobin: 10 g/dL — ABNORMAL LOW (ref 13.0–17.0)
MCH: 32.9 pg (ref 26.0–34.0)
MCHC: 32.7 g/dL (ref 30.0–36.0)
MCV: 100.7 fL — ABNORMAL HIGH (ref 80.0–100.0)
Platelets: 80 10*3/uL — ABNORMAL LOW (ref 150–400)
RBC: 3.04 MIL/uL — ABNORMAL LOW (ref 4.22–5.81)
RDW: 15 % (ref 11.5–15.5)
WBC: 2.9 10*3/uL — ABNORMAL LOW (ref 4.0–10.5)
nRBC: 0 % (ref 0.0–0.2)

## 2020-03-08 LAB — COMPREHENSIVE METABOLIC PANEL
ALT: 25 U/L (ref 0–44)
AST: 41 U/L (ref 15–41)
Albumin: 3.3 g/dL — ABNORMAL LOW (ref 3.5–5.0)
Alkaline Phosphatase: 83 U/L (ref 38–126)
Anion gap: 13 (ref 5–15)
BUN: 11 mg/dL (ref 8–23)
CO2: 29 mmol/L (ref 22–32)
Calcium: 8.7 mg/dL — ABNORMAL LOW (ref 8.9–10.3)
Chloride: 93 mmol/L — ABNORMAL LOW (ref 98–111)
Creatinine, Ser: 3.46 mg/dL — ABNORMAL HIGH (ref 0.61–1.24)
GFR calc Af Amer: 21 mL/min — ABNORMAL LOW (ref >60)
GFR calc non Af Amer: 18 mL/min — ABNORMAL LOW (ref >60)
Glucose, Bld: 123 mg/dL — ABNORMAL HIGH (ref 70–99)
Potassium: 3 mmol/L — ABNORMAL LOW (ref 3.5–5.1)
Sodium: 135 mmol/L (ref 135–145)
Total Bilirubin: 0.8 mg/dL (ref 0.3–1.2)
Total Protein: 6.6 g/dL (ref 6.5–8.1)

## 2020-03-08 LAB — ETHANOL: Alcohol, Ethyl (B): <10 mg/dL (ref <10)

## 2020-03-08 LAB — RESPIRATORY PANEL BY RT PCR (FLU A&B, COVID)
Influenza A by PCR: NEGATIVE
Influenza B by PCR: NEGATIVE
SARS Coronavirus 2 by RT PCR: NEGATIVE

## 2020-03-08 MED ORDER — LORAZEPAM 1 MG PO TABS
1.0000 mg | ORAL_TABLET | Freq: Once | ORAL | Status: AC
  Administered 2020-03-08: 1 mg via ORAL
  Filled 2020-03-08: qty 1

## 2020-03-08 NOTE — ED Notes (Signed)
ED Provider, Jimmye Norman at bedside. Pt denies SI at this time. Pt received/finished full dialysis treatment today.  Pt requesting to be evaluated.  Pt A/Ox4.

## 2020-03-08 NOTE — ED Notes (Signed)
Hourly rounding reveals patient in room sitting in chair watching TV. No complaints, stable, in no acute distress. Q15 minute rounds and monitoring via [employer] to continue.
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
## 2020-03-08 NOTE — ED Notes (Signed)
Pt provide with dinner tray and water by this RN.

## 2020-03-08 NOTE — ED Notes (Signed)
Hourly rounding reveals patient in room watching TV. No complaints, stable, in no acute distress. Q15 minute rounds and monitoring via [employer] to continue.
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
## 2020-03-08 NOTE — ED Notes (Signed)
Pt provided with Urine specimen cup. Pt unable to provide a UA at this time.
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
## 2020-03-08 NOTE — ED Notes (Signed)
Pt crying and screaming "mom". This Rn reassured and therapeutic ocmunication provided topt. Pt provided with a  Remote control.  Pt calm and cooperative at this time.

## 2020-03-08 NOTE — ED Notes (Addendum)
Black sneakers Jeans [employer] teeshirt [employer] belt [employer] boxer shorts Black socks Light blue hoodie Programmer, systems /white coat Blue hat  Wallet/nedcklace  given to [employer] caregiver

## 2020-03-08 NOTE — ED Triage Notes (Signed)
Pt bought in by a caregiver from [facility].  Pt reports hearing voices.  Denies SI.  Denies etoh use or drug use.  Difficulty sleeping recently.  Pt alert  Speech clear.

## 2020-03-09 ENCOUNTER — Inpatient Hospital Stay
Admission: RE | Admit: 2020-03-09 | Discharge: 2020-03-09 | DRG: 885 | Disposition: A | Discharge status: Home / Self Care | Payer: Medicare Other | Source: Intra-hospital | Attending: Psychiatry | Admitting: Psychiatry

## 2020-03-09 ENCOUNTER — Encounter: Payer: Self-pay | Admitting: Behavioral Health

## 2020-03-09 DIAGNOSIS — Z823 Family history of stroke: Secondary | ICD-10-CM | POA: Diagnosis not present

## 2020-03-09 DIAGNOSIS — Z8673 Personal history of transient ischemic attack (TIA), and cerebral infarction without residual deficits: Secondary | ICD-10-CM

## 2020-03-09 DIAGNOSIS — Z833 Family history of diabetes mellitus: Secondary | ICD-10-CM

## 2020-03-09 DIAGNOSIS — N186 End stage renal disease: Secondary | ICD-10-CM | POA: Diagnosis present

## 2020-03-09 DIAGNOSIS — F209 Schizophrenia, unspecified: Principal | ICD-10-CM | POA: Diagnosis present

## 2020-03-09 DIAGNOSIS — Z992 Dependence on renal dialysis: Secondary | ICD-10-CM

## 2020-03-09 DIAGNOSIS — E1122 Type 2 diabetes mellitus with diabetic chronic kidney disease: Secondary | ICD-10-CM | POA: Diagnosis present

## 2020-03-09 DIAGNOSIS — Z8051 Family history of malignant neoplasm of kidney: Secondary | ICD-10-CM | POA: Diagnosis not present

## 2020-03-09 DIAGNOSIS — F203 Undifferentiated schizophrenia: Secondary | ICD-10-CM

## 2020-03-09 DIAGNOSIS — Z79899 Other long term (current) drug therapy: Secondary | ICD-10-CM

## 2020-03-09 DIAGNOSIS — I10 Essential (primary) hypertension: Secondary | ICD-10-CM | POA: Diagnosis present

## 2020-03-09 DIAGNOSIS — E785 Hyperlipidemia, unspecified: Secondary | ICD-10-CM | POA: Diagnosis present

## 2020-03-09 DIAGNOSIS — E119 Type 2 diabetes mellitus without complications: Secondary | ICD-10-CM

## 2020-03-09 DIAGNOSIS — I120 Hypertensive chronic kidney disease with stage 5 chronic kidney disease or end stage renal disease: Secondary | ICD-10-CM | POA: Diagnosis present

## 2020-03-09 MED ORDER — NIACIN ER (ANTIHYPERLIPIDEMIC) 500 MG PO TBCR
1000.0000 mg | EXTENDED_RELEASE_TABLET | Freq: Every day | ORAL | Status: DC
  Filled 2020-03-09: qty 2

## 2020-03-09 MED ORDER — PRAVASTATIN SODIUM 40 MG PO TABS
40.0000 mg | ORAL_TABLET | Freq: Every day | ORAL | Status: DC
  Filled 2020-03-09: qty 1

## 2020-03-09 MED ORDER — GLIPIZIDE ER 2.5 MG PO TB24
2.5000 mg | ORAL_TABLET | Freq: Every day | ORAL | Status: DC
  Administered 2020-03-09: 11:00:00 2.5 mg via ORAL
  Filled 2020-03-09 (×2): qty 1

## 2020-03-09 MED ORDER — ACETAMINOPHEN 325 MG PO TABS: 650.0000 mg | ORAL_TABLET | Freq: Four times a day (QID) | ORAL | Status: DC | PRN

## 2020-03-09 MED ORDER — DIPHENHYDRAMINE HCL 25 MG PO CAPS: 25.0000 mg | ORAL_CAPSULE | Freq: Two times a day (BID) | ORAL | Status: DC | PRN

## 2020-03-09 MED ORDER — ACETAMINOPHEN 500 MG PO TABS: 500.0000 mg | ORAL_TABLET | Freq: Four times a day (QID) | ORAL | Status: DC | PRN

## 2020-03-09 MED ORDER — CARVEDILOL 12.5 MG PO TABS
25.0000 mg | ORAL_TABLET | Freq: Two times a day (BID) | ORAL | Status: DC
  Administered 2020-03-09: 09:00:00 25 mg via ORAL
  Filled 2020-03-09 (×2): qty 2

## 2020-03-09 MED ORDER — ADULT MULTIVITAMIN W/MINERALS CH
1.0000 | ORAL_TABLET | Freq: Every day | ORAL | Status: DC
  Administered 2020-03-09: 09:00:00 1 via ORAL
  Filled 2020-03-09: qty 1

## 2020-03-09 MED ORDER — LIDOCAINE-PRILOCAINE 2.5-2.5 % EX CREA
1.0000 "application " | TOPICAL_CREAM | CUTANEOUS | Status: DC
  Filled 2020-03-09: qty 5

## 2020-03-09 MED ORDER — HALOPERIDOL 5 MG PO TABS: 7.5000 mg | ORAL_TABLET | Freq: Every day | ORAL | Status: DC

## 2020-03-09 MED ORDER — LORATADINE 10 MG PO TABS
10.0000 mg | ORAL_TABLET | Freq: Every day | ORAL | Status: DC
  Administered 2020-03-09: 09:00:00 10 mg via ORAL
  Filled 2020-03-09: qty 1

## 2020-03-09 MED ORDER — MAGNESIUM HYDROXIDE 400 MG/5ML PO SUSP: 30.0000 mL | Freq: Every day | ORAL | Status: DC | PRN

## 2020-03-09 MED ORDER — DOCUSATE SODIUM 100 MG PO CAPS
100.0000 mg | ORAL_CAPSULE | Freq: Two times a day (BID) | ORAL | Status: DC
  Administered 2020-03-09: 100 mg via ORAL
  Filled 2020-03-09: qty 1

## 2020-03-09 MED ORDER — ASPIRIN EC 81 MG PO TBEC
81.0000 mg | DELAYED_RELEASE_TABLET | Freq: Every day | ORAL | Status: DC
  Administered 2020-03-09: 81 mg via ORAL
  Filled 2020-03-09: qty 1

## 2020-03-09 MED ORDER — BENZTROPINE MESYLATE 1 MG PO TABS
0.5000 mg | ORAL_TABLET | Freq: Two times a day (BID) | ORAL | Status: DC
  Administered 2020-03-09: 0.5 mg via ORAL
  Filled 2020-03-09: qty 1

## 2020-03-09 MED ORDER — LOSARTAN POTASSIUM 50 MG PO TABS
100.0000 mg | ORAL_TABLET | Freq: Every day | ORAL | Status: DC
  Administered 2020-03-09: 11:00:00 100 mg via ORAL
  Filled 2020-03-09 (×2): qty 2

## 2020-03-09 MED ORDER — DIPHENHYDRAMINE-ZINC ACETATE 2-0.1 % EX STCK: 1.0000 "application " | Freq: Three times a day (TID) | CUTANEOUS | Status: DC

## 2020-03-09 MED ORDER — ALUM & MAG HYDROXIDE-SIMETH 200-200-20 MG/5ML PO SUSP: 30.0000 mL | ORAL | Status: DC | PRN

## 2020-03-09 NOTE — Plan of Care (Signed)
Patient calm and cooperative.  Problem: Education: Goal: Emotional status will improve Outcome: Progressing Goal: Mental status will improve Outcome: Progressing
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
## 2020-03-09 NOTE — Progress Notes (Signed)
DISCHARGE SUMMARY:  The patient was discharged with transportation provided by his group home staff.  Belongings were returned and discharge paperwork was reviewed.  The patient's mood prior to discharge was calm, cooperative, and euthymic.

## 2020-03-09 NOTE — Progress Notes (Signed)
Recreation Therapy Notes   Date: 03/09/2020  Time: 9:30 am  Location: Craft Room  Behavioral response: Appropriate   Intervention Topic: Anger management    Discussion/Intervention:  Group content on today was focused on anger management. The group defined anger and reasons they become angry. Individuals expressed negative way they have dealt with anger in the past. Patients stated some positive ways they could deal with anger in the future. The group described how anger can affect your health and daily plans. Individuals participated in the intervention "Blowing up anger" where they had a chance to answer questions about themselves and see how much energy they waste on being angry.  Clinical Observations/Feedback:  Patient came to group late due to unknown reasons.Individual was social with peers and staff while participating in the intervention during group.  Leza Apsey LRT/CTRS         Shara Hartis 03/09/2020 11:30 AM

## 2020-03-09 NOTE — Discharge Summary (Signed)
Physician Discharge Summary Note  Patient:  Lawrence [name] is an 64 y.o., male MRN:  [id number] DOB:  May 07, 1956 Patient phone:  [phone] (home)  Patient address:   [place] [address],  Total Time spent with patient: 1 hour  Date of Admission:  03/09/2020 Date of Discharge: March 09, 2020  Reason for Admission: Patient was brought to the emergency room after his dialysis session with a report that he had been threatening to another client.  Patient has shown no dangerous aggressive or suicidal behavior throughout his assessment.  He describes the incident as being that another person was asking him rude questions and he told him to please leave him alone.  He denies that he was threatening or aggressive.  Patient denies any current suicidal or homicidal thought and does not appear to be actively psychotic.  Principal Problem: Schizophrenia [facility]) Discharge Diagnoses: Principal Problem:   Schizophrenia ([place]) Active Problems:   ESRD on dialysis ([place])   HTN (hypertension)   Controlled type 2 diabetes mellitus without complication, without long-term current use of insulin ([place])   Past Psychiatric History: Patient has a past history of schizophrenia.  Appears to have been stable on 7-1/2 mg of haloperidol.  He says he believes he has had hospitalizations in the past but it has been years ago.  He denies any past suicidal or homicidal behavior.  Looking through the chart I do not find any evidence of any documentation of dangerous behavior in the past.  Past Medical History:  Past Medical History:  Diagnosis Date  . Anemia   . Cerebral hemorrhage ([place]) 2012  . Chronic constipation   . Chronic kidney disease   . Diabetes mellitus without complication ([place])   . Hemodialysis patient ([place])   . Hyperlipidemia   . Hypertension   . Schizophrenia ([place])   . Stroke [facility])     Past Surgical History:  Procedure Laterality Date  . A/V  FISTULAGRAM Left 02/24/2017   Procedure: A/V Fistulagram;  Surgeon: Katha Cabal, MD;  Location: [place] [facility];  Service: Cardiovascular;  Laterality: Left;  . PERIPHERAL VASCULAR CATHETERIZATION N/A 04/17/2015   Procedure: Dialysis/Perma Catheter Insertion;  Surgeon: Katha Cabal, MD;  Location: [place] [facility];  Service: Cardiovascular;  Laterality: N/A;  . PERIPHERAL VASCULAR CATHETERIZATION Left 07/30/2015   Procedure: A/V Shuntogram/Fistulagram;  Surgeon: Algernon Huxley, MD;  Location: [place] [facility];  Service: Cardiovascular;  Laterality: Left;  . PERIPHERAL VASCULAR CATHETERIZATION Left 07/30/2015   Procedure: A/V Shunt Intervention;  Surgeon: Algernon Huxley, MD;  Location: [place] [facility];  Service: Cardiovascular;  Laterality: Left;  . PERIPHERAL VASCULAR CATHETERIZATION N/A 08/20/2015   Procedure: DIALYSIS/PERMA CATHETER REMOVAL;  Surgeon: Algernon Huxley, MD;  Location: [place] [facility];  Service: Cardiovascular;  Laterality: N/A;  . VASCULAR SURGERY     Fistula placement   Family History:  Family History  Problem Relation Age of Onset  . Diabetes Mother   . Kidney cancer Mother   . Diabetes Sister   . Stroke Brother   . Prostate cancer Neg Hx   . Bladder Cancer Neg Hx    Family Psychiatric  History: Patient believes that he may have 1 or 2 family members with mental health problems but he is not certain Social History:  Social History   Substance and Sexual Activity  Alcohol Use No     Social History   Substance and Sexual  Activity  Drug Use No    Social History   Socioeconomic History  . Marital status: Single    Spouse name: Not on file  . Number of children: Not on file  . Years of education: Not on file  . Highest education level: Not on file  Occupational History  . Not on file  Tobacco Use  . Smoking status: Never Smoker  . Smokeless tobacco: Never Used  Substance and Sexual Activity  . Alcohol use: No  . Drug use: No  .  Sexual activity: Not on file  Other Topics Concern  . Not on file  Social History Narrative  . Not on file   Social Determinants of Health   Financial Resource Strain:   . Difficulty of Paying Living Expenses:   Food Insecurity:   . Worried About Charity fundraiser in the Last Year:   . Arboriculturist in the Last Year:   Transportation Needs:   . Film/video editor (Medical):   [name] Lack of Transportation (Non-Medical):   Physical Activity:   . Days of Exercise per Week:   . Minutes of Exercise per Session:   Stress:   . Feeling of Stress :   Social Connections:   . Frequency of Communication with Friends and Family:   . Frequency of Social Gatherings with Friends and Family:   . Attends Religious Services:   . Active Member of Clubs or Organizations:   . Attends Archivist Meetings:   [name] Marital Status:     Hospital Course: Patient admitted to the hospital.  Maintained on 15-minute checks.  Medications maintained the same as what is ordered outpatient.  Patient has been cooperative in the hospital and has shown no dangerous aggressive violent or suicidal behavior.  He consistently denies any suicidal ideation and any homicidal ideation.  Denies any feeling of anger towards anyone.  Patient mentioned having hallucinations but on closer questioning says that they are not active now and in fact he cannot remember the last time he actually heard things.  At this point patient does not appear to meet commitment criteria.  Does not appear to be off of his baseline.  He lives in a stable group home with outpatient treatment.  I am going to recommend discharge of this patient from the hospital back to his group home without any change to his treatment plan  Physical Findings: AIMS:  , ,  ,  ,    CIWA:    COWS:     Musculoskeletal: Strength & Muscle Tone: within normal limits Gait & Station: normal Patient leans: N/A  Psychiatric Specialty Exam: Physical Exam  Nursing  note and vitals reviewed. Constitutional: He appears well-developed and well-nourished.  HENT:  Head: Normocephalic and atraumatic.  Eyes: Pupils are equal, round, and reactive to light. Conjunctivae are normal.  Cardiovascular: Regular rhythm and normal heart sounds.  Respiratory: Effort normal. No respiratory distress.  GI: Soft.  Musculoskeletal:        General: Normal range of motion.     Cervical back: Normal range of motion.  Neurological: He is alert.  Skin: Skin is warm and dry.  Psychiatric: His speech is normal and behavior is normal. Judgment and thought content normal. His affect is blunt. Cognition and memory are impaired.    Review of Systems  Constitutional: Negative.   HENT: Negative.   Eyes: Negative.   Respiratory: Negative.   Cardiovascular: Negative.   Gastrointestinal: Negative.   Musculoskeletal:  Negative.   Skin: Negative.   Neurological: Negative.   Psychiatric/Behavioral: Positive for hallucinations. Negative for suicidal ideas.    Blood pressure (!) 161/97, pulse 74, temperature 97.7 F (36.5 C), temperature source Oral, resp. rate 18, height 5\' 7"  (1.702 m), weight 72.6 kg, SpO2 100 %.Body mass index is 25.07 kg/m.  General Appearance: Casual  Eye Contact:  Good  Speech:  Garbled  Volume:  Normal  Mood:  Euthymic  Affect:  Congruent  Thought Process:  Goal Directed  Orientation:  Full (Time, Place, and Person)  Thought Content:  Logical  Suicidal Thoughts:  No  Homicidal Thoughts:  No  Memory:  Immediate;   Fair Recent;   Fair Remote;   Fair  Judgement:  Fair  Insight:  Fair  Psychomotor Activity:  Normal  Concentration:  Concentration: Fair  Recall:  [employer] of Knowledge:  Fair  Language:  Fair  Akathisia:  No  Handed:  Right  AIMS (if indicated):     Assets:  Desire for Improvement Housing Resilience Social Support  ADL's:  Impaired  Cognition:  Impaired,  Mild  Sleep:        Have you used any form of tobacco in the last  30 days? (Cigarettes, Smokeless Tobacco, Cigars, and/or Pipes): No  Has this patient used any form of tobacco in the last 30 days? (Cigarettes, Smokeless Tobacco, Cigars, and/or Pipes) Yes, No  Blood Alcohol level:  Lab Results  Component Value Date   ETH <10 35/32/9924    Metabolic Disorder Labs:  No results found for: HGBA1C, MPG No results found for: PROLACTIN No results found for: CHOL, TRIG, HDL, CHOLHDL, VLDL, LDLCALC  See Psychiatric Specialty Exam and Suicide Risk Assessment completed by Attending Physician prior to discharge.  Discharge destination:  Home  Is patient on multiple antipsychotic therapies at discharge:  No   Has Patient had three or more failed trials of antipsychotic monotherapy by history:  No  Recommended Plan for Multiple Antipsychotic Therapies: NA  Discharge Instructions    Diet - low sodium heart healthy   Complete by: As directed    Increase activity slowly   Complete by: As directed      Allergies as of 03/09/2020      Reactions   Nsaids Other (See Comments)   Cannot take due to renal disease      Medication List    STOP taking these medications   acetaminophen 500 MG tablet Commonly known as: TYLENOL     TAKE these medications     Indication  aspirin EC 81 MG tablet Take 81 mg by mouth daily.  Indication: Stable Angina Pectoris   Banophen 25 mg capsule Generic drug: diphenhydrAMINE Take 25 mg by mouth See admin instructions. Take 1 capsule (25mg ) by mouth nightly as needed for sleep - may take 1 capsule (25mg ) by mouth during the daytime as needed for itching  Indication: Trouble Sleeping   BENADRYL ITCH RELIEF STICK 2-0.1 % Stck Generic drug: diphenhydrAMINE-Zinc Acetate Apply 1 application topically in the morning, at noon, and at bedtime.  Indication: Itching   benztropine 0.5 MG tablet Commonly known as: COGENTIN Take 0.5 mg by mouth 2 (two) times daily.  Indication: Extrapyramidal Reaction caused by Medications    carvedilol 25 MG tablet Commonly known as: COREG Take 25 mg by mouth 2 (two) times daily with a meal.  Indication: High Blood Pressure Disorder   cetirizine 10 MG tablet Commonly known as: ZYRTEC Take 10 mg by mouth  at bedtime.  Indication: Perennial Allergic Rhinitis   docusate sodium 100 MG capsule Commonly known as: COLACE Take 100 mg by mouth 2 (two) times daily.  Indication: Constipation   glipiZIDE 2.5 MG 24 hr tablet Commonly known as: GLUCOTROL XL Take 2.5 mg by mouth daily with breakfast.  Indication: Type 2 Diabetes   haloperidol 5 MG tablet Commonly known as: HALDOL Take 7.5 mg by mouth at bedtime.  Indication: Schizophrenia   lidocaine-prilocaine cream Commonly known as: EMLA Apply 1 application topically 3 (three) times a week. Apply over dialysis shunt 30 minutes prior to dialysis.  Indication: Anesthesia to a Specific Part of the Body   losartan 100 MG tablet Commonly known as: COZAAR Take 1 tablet (100 mg total) by mouth daily.  Indication: High Blood Pressure Disorder   multivitamin with minerals tablet Take 1 tablet by mouth daily.  Indication: General nutrition in a patient on dialysis   niacin 1000 MG CR tablet Commonly known as: NIASPAN Take 1,000 mg by mouth at bedtime.  Indication: High Amount of Triglycerides in the Blood   pravastatin 40 MG tablet Commonly known as: PRAVACHOL Take 40 mg by mouth at bedtime.  Indication: High Amount of Fats in the Blood        Follow-up recommendations:  Activity:  Activity as tolerated Diet:  Diet as usual with what he is taking at home whether that be carbohydrate controlled or renal Other:  No change to any treatment  Comments: No prescriptions needed at discharge  Signed: Alethia Berthold, MD 03/09/2020, 3:15 PM

## 2020-03-09 NOTE — Tx Team (Signed)
Initial Treatment Plan 03/09/2020 4:58 AM [name] [id number]    PATIENT STRESSORS: Financial difficulties Medication change or noncompliance   PATIENT STRENGTHS: Ability for insight Average or above average intelligence General fund of knowledge   PATIENT IDENTIFIED PROBLEMS: Schizophrenia     Depression                  DISCHARGE CRITERIA:  Improved stabilization in mood, thinking, and/or behavior Medical problems require only outpatient monitoring Motivation to continue treatment in a less acute level of care  PRELIMINARY DISCHARGE PLAN: Outpatient therapy  PATIENT/FAMILY INVOLVEMENT: This treatment plan has been presented to and reviewed with the patient, Lawrence [name],  The patient and family have been given the opportunity to ask questions and make suggestions.  Harl Bowie, RN 03/09/2020, 4:58 AM

## 2020-03-09 NOTE — ED Notes (Signed)
Hourly rounding reveals patient in room watching TV in chair. No complaints, stable, in no acute distress. Q15 minute rounds and monitoring via [employer] to continue.

## 2020-03-09 NOTE — BHH Suicide Risk Assessment (Signed)
[facility] Admission Suicide Risk Assessment   Nursing information obtained from:  Patient Demographic factors:  NA Current Mental Status:  NA Loss Factors:  NA Historical Factors:  NA Risk Reduction Factors:  NA  Total Time spent with patient: 1 hour Principal Problem: Schizophrenia ([place]) Diagnosis:  Principal Problem:   Schizophrenia ([place]) Active Problems:   ESRD on dialysis ([place])   HTN (hypertension)   Controlled type 2 diabetes mellitus without complication, without long-term current use of insulin ([place])  Subjective Data: Patient seen chart reviewed.  Patient admitted to the hospital after being brought from dialysis with complaints that he had been threatening to another patient.  Patient denies any suicidal or homicidal thoughts whatsoever.  Denies any past history of suicidal or homicidal behavior.  Review of his chart does not show any evidence of past violence or dangerousness.  Patient is calm and pleasant currently and cooperative.  Does not report depression.  Not abusing substances  Continued Clinical Symptoms:  Alcohol Use Disorder Identification Test Final Score (AUDIT): 0 The "Alcohol Use Disorders Identification Test", Guidelines for Use in Primary Care, Second Edition.  World Pharmacologist [facility]). Score between 0-7:  no or low risk or alcohol related problems. Score between 8-15:  moderate risk of alcohol related problems. Score between 16-19:  high risk of alcohol related problems. Score 20 or above:  warrants further diagnostic evaluation for alcohol dependence and treatment.   CLINICAL FACTORS:   Schizophrenia:   Paranoid or undifferentiated type   Musculoskeletal: Strength & Muscle Tone: within normal limits Gait & Station: normal Patient leans: N/A  Psychiatric Specialty Exam: Physical Exam  Nursing note and vitals reviewed. Constitutional: He appears well-developed and well-nourished.  HENT:  Head: Normocephalic and atraumatic.  Eyes: Pupils are  equal, round, and reactive to light. Conjunctivae are normal.  Cardiovascular: Regular rhythm and normal heart sounds.  Respiratory: Effort normal. No respiratory distress.  GI: Soft.  Musculoskeletal:        General: Normal range of motion.     Cervical back: Normal range of motion.  Neurological: He is alert.  Skin: Skin is warm and dry.  Psychiatric: He has a normal mood and affect. His speech is normal and behavior is normal. Judgment and thought content normal. Cognition and memory are impaired.    Review of Systems  Constitutional: Negative.   HENT: Negative.   Eyes: Negative.   Respiratory: Negative.   Cardiovascular: Negative.   Gastrointestinal: Negative.   Musculoskeletal: Negative.   Skin: Negative.   Neurological: Negative.   Psychiatric/Behavioral: Positive for hallucinations. Negative for suicidal ideas.    Blood pressure (!) 161/97, pulse 74, temperature 97.7 F (36.5 C), temperature source Oral, resp. rate 18, height 5\' 7"  (1.702 m), weight 72.6 kg, SpO2 100 %.Body mass index is 25.07 kg/m.  General Appearance: Casual  Eye Contact:  Fair  Speech:  Garbled  Volume:  Normal  Mood:  Euthymic  Affect:  Congruent  Thought Process:  Goal Directed  Orientation:  Full (Time, Place, and Person)  Thought Content:  Logical and Tangential  Suicidal Thoughts:  No  Homicidal Thoughts:  No  Memory:  Immediate;   Fair Recent;   Fair Remote;   Fair  Judgement:  Fair  Insight:  Fair  Psychomotor Activity:  Normal  Concentration:  Concentration: Fair  Recall:  [employer] of Knowledge:  Fair  Language:  Fair  Akathisia:  No  Handed:  Right  AIMS (if indicated):     Assets:  Desire for Improvement Housing Resilience Social Support  ADL's:  Impaired  Cognition:  Impaired,  Mild  Sleep:         COGNITIVE FEATURES THAT CONTRIBUTE TO RISK:  None    SUICIDE RISK:   Minimal: No identifiable suicidal ideation.  Patients presenting with no risk factors but with  morbid ruminations; may be classified as minimal risk based on the severity of the depressive symptoms  PLAN OF CARE: Patient currently appears to be almost certainly at his baseline and does not have any evidence of representing a danger to himself or others.  He has safe living quarters and care and is compliant with treatment.  Does not meet commitment criteria.  Does not need further hospital care.  I certify that inpatient services furnished can reasonably be expected to improve the patient's condition.   Alethia Berthold, MD 03/09/2020, 2:54 PM
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
## 2020-03-09 NOTE — ED Notes (Signed)
Hourly rounding reveals patient in room sitting in chair watching TV. No complaints, stable, in no acute distress. Q15 minute rounds and monitoring via [employer] to continue.

## 2020-03-09 NOTE — Progress Notes (Signed)
BRIEF PHARMACY NOTE   This patient attended and participated in Medication Management Group counseling led by [facility] staff pharmacist.  This interactive class reviews basic information about prescription medications and education on personal responsibility in medication management.  The class also includes general knowledge of 3 main classes of behavioral medications, including antipsychotics, antidepressants, and mood stabilizers.     Patient behavior was appropriate for group setting.   Educational materials sourced from:  "Medication Do's and Don'ts" from [email or web address]   "Mental Health Medications" from [email or web address] [place], PharmD, BCPS Clinical Pharmacist 03/09/2020 3:22 PM

## 2020-03-09 NOTE — BHH Group Notes (Signed)
[place] Group Notes:  (Nursing/MHT/Case Management/Adjunct)  Date:  03/09/2020  Time:  9:43 AM  Type of Therapy:  Community Meeting  Participation Level:  Did Not Attend  Summary of Progress/Problems:  Charna Busman 03/09/2020, 9:43 AM

## 2020-03-09 NOTE — BH Assessment (Signed)
Patient is to be admitted to [facility] by Psychiatric Nurse Practitioner Caroline Sauger.  Attending Physician will be Dr. Weber Cooks.   Patient has been assigned to room 302, by [place] Nurse .   Intake Paper Work has been signed and placed on patient chart.  ER staff is aware of the admission:    Dr. Owens Shark, ER MD   [facility] Patient's Nurse   [place] Patient Access.
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
## 2020-03-09 NOTE — Plan of Care (Signed)
Patient new to the unit  Problem: Education: Goal: Knowledge of [facility] Education information/materials will improve Outcome: Not Progressing Goal: Emotional status will improve Outcome: Not Progressing Goal: Mental status will improve Outcome: Not Progressing Goal: Verbalization of understanding the information provided will improve Outcome: Not Progressing   Problem: Safety: Goal: Periods of time without injury will increase Outcome: Not Progressing   Problem: Education: Goal: Will be free of psychotic symptoms Outcome: Not Progressing Goal: Knowledge of the prescribed therapeutic regimen will improve Outcome: Not Progressing   Problem: Safety: Goal: Ability to redirect hostility and anger into socially appropriate behaviors will improve Outcome: Not Progressing Goal: Ability to remain free from injury will improve Outcome: Not Progressing

## 2020-03-09 NOTE — [facility] Suicide Risk Assessment (Signed)
[facility] Discharge Suicide Risk Assessment   Principal Problem: Schizophrenia [facility]) Discharge Diagnoses: Principal Problem:   Schizophrenia ([place]) Active Problems:   ESRD on dialysis ([place])   HTN (hypertension)   Controlled type 2 diabetes mellitus without complication, without long-term current use of insulin ([place])   Total Time spent with patient: 1 hour  Musculoskeletal: Strength & Muscle Tone: within normal limits Gait & Station: normal Patient leans: N/A  Psychiatric Specialty Exam: Review of Systems  Constitutional: Negative.   HENT: Negative.   Eyes: Negative.   Respiratory: Negative.   Cardiovascular: Negative.   Gastrointestinal: Negative.   Musculoskeletal: Negative.   Skin: Negative.   Neurological: Negative.   Psychiatric/Behavioral: Positive for hallucinations.    Blood pressure (!) 161/97, pulse 74, temperature 97.7 F (36.5 C), temperature source Oral, resp. rate 18, height 5\' 7"  (1.702 m), weight 72.6 kg, SpO2 100 %.Body mass index is 25.07 kg/m.  General Appearance: Casual  Eye Contact::  Good  Speech:  Garbled409  Volume:  Normal  Mood:  Euthymic  Affect:  Congruent  Thought Process:  Goal Directed  Orientation:  Full (Time, Place, and Person)  Thought Content:  Logical and Tangential  Suicidal Thoughts:  No  Homicidal Thoughts:  No  Memory:  Immediate;   Fair Recent;   Fair Remote;   Fair  Judgement:  Fair  Insight:  Fair  Psychomotor Activity:  Normal and TD  Concentration:  Fair  Recall:  [employer] of [place]  Language: Fair  Akathisia:  No  Handed:  Right  AIMS (if indicated):     Assets:  Desire for Improvement Housing Resilience Social Support  Sleep:     Cognition: Impaired,  Mild  ADL's:  Impaired   Mental Status Per Nursing Assessment::   On Admission:  NA  Demographic Factors:  Male  Loss Factors: Loss of significant relationship  Historical Factors: NA  Risk Reduction Factors:   Sense of responsibility to  family, Living with another person, especially a relative, Positive social support, Positive therapeutic relationship and Positive coping skills or problem solving skills  Continued Clinical Symptoms:  Schizophrenia:   Paranoid or undifferentiated type  Cognitive Features That Contribute To Risk:  None    Suicide Risk:  Minimal: No identifiable suicidal ideation.  Patients presenting with no risk factors but with morbid ruminations; may be classified as minimal risk based on the severity of the depressive symptoms    Plan Of Care/Follow-up recommendations:  Activity:  Activity as tolerated Diet:  Regular diet Other:  Patient has outpatient care already in place and will follow-up as usual without any plan to changing care  Alethia Berthold, MD 03/09/2020, 2:58 PM

## 2020-03-09 NOTE — Progress Notes (Signed)
Recreation Therapy Notes  INPATIENT RECREATION THERAPY ASSESSMENT  Patient Details Name: Jessiah Steinhart MRN: [id number] DOB: 13-Jan-1956 Today's Date: 03/09/2020       Information Obtained From: Patient  Able to Participate in Assessment/Interview: Yes  Patient Presentation: Responsive  Reason for Admission (Per Patient): Active Symptoms  Patient Stressors:    Coping Skills:   Music, Exercise, Prayer  Leisure Interests (2+):  Games - Cards, Individual - Reading, Exercise - Walking, Games - [employer], Games - Jig-saw puzzles  Frequency of Recreation/Participation: Weekly  Awareness of Community Resources:  Yes  Community Resources:  [employer]  Current Use:    If no, Barriers?:    Expressed Interest in [place] of Residence:  [employer]  Patient Main Form of Transportation: Other (Comment)(Care giver)  Patient Strengths:  Nice, Getting along with others  Patient Identified Areas of Improvement:  Work on my attitude  Patient Goal for Hospitalization:  Get things accomplished  Current SI (including self-harm):  No  Current HI:  No  Current AVH: No  Staff Intervention Plan: Group Attendance, Collaborate with Interdisciplinary Treatment Team  Consent to Intern Participation: N/A  Adrion Menz 03/09/2020, 1:49 PM

## 2020-05-30 DIAGNOSIS — E8770 Fluid overload, unspecified: Secondary | ICD-10-CM | POA: Insufficient documentation

## 2020-06-04 DIAGNOSIS — E875 Hyperkalemia: Secondary | ICD-10-CM | POA: Insufficient documentation

## 2020-08-17 ENCOUNTER — Encounter (INDEPENDENT_AMBULATORY_CARE_PROVIDER_SITE_OTHER): Payer: Medicare Other

## 2020-08-17 ENCOUNTER — Ambulatory Visit (INDEPENDENT_AMBULATORY_CARE_PROVIDER_SITE_OTHER): Payer: Medicare Other | Admitting: Vascular Surgery

## 2020-08-27 ENCOUNTER — Other Ambulatory Visit: Payer: Self-pay

## 2020-08-27 ENCOUNTER — Ambulatory Visit (INDEPENDENT_AMBULATORY_CARE_PROVIDER_SITE_OTHER): Payer: Medicare Other

## 2020-08-27 ENCOUNTER — Encounter (INDEPENDENT_AMBULATORY_CARE_PROVIDER_SITE_OTHER): Payer: Self-pay | Admitting: Nurse Practitioner

## 2020-08-27 ENCOUNTER — Ambulatory Visit (INDEPENDENT_AMBULATORY_CARE_PROVIDER_SITE_OTHER): Payer: Medicare Other | Admitting: Nurse Practitioner

## 2020-08-27 VITALS — BP 93/62 | HR 77 | Ht 67.0 in | Wt 156.0 lb

## 2020-08-27 DIAGNOSIS — N186 End stage renal disease: Secondary | ICD-10-CM | POA: Diagnosis not present

## 2020-08-27 DIAGNOSIS — E1122 Type 2 diabetes mellitus with diabetic chronic kidney disease: Secondary | ICD-10-CM

## 2020-08-27 DIAGNOSIS — I10 Essential (primary) hypertension: Secondary | ICD-10-CM

## 2020-08-27 DIAGNOSIS — Z992 Dependence on renal dialysis: Secondary | ICD-10-CM

## 2020-08-28 ENCOUNTER — Telehealth (INDEPENDENT_AMBULATORY_CARE_PROVIDER_SITE_OTHER): Payer: Self-pay

## 2020-08-28 NOTE — Telephone Encounter (Signed)
Spoke with the patient's caregiver at [facility] and the patient is scheduled with Dr. Lucky Cowboy for a LUE fistulagram on 09/03/20 with a 8:-00 am arrival time to the [facility]. Covid testing is on 08/31/20 between 8-1 pm at the [facility]. Pre-procedure instructions were discussed and will be faxed to Dorna Bloom at [facility].

## 2020-08-30 ENCOUNTER — Other Ambulatory Visit: Admission: RE | Admit: 2020-08-30 | Payer: Medicare Other | Source: Ambulatory Visit

## 2020-08-31 ENCOUNTER — Other Ambulatory Visit
Admission: RE | Admit: 2020-08-31 | Discharge: 2020-08-31 | Disposition: A | Discharge status: Home / Self Care | Payer: Medicare Other | Source: Ambulatory Visit | Attending: Vascular Surgery | Admitting: Vascular Surgery

## 2020-08-31 ENCOUNTER — Other Ambulatory Visit: Payer: Self-pay

## 2020-08-31 ENCOUNTER — Encounter (INDEPENDENT_AMBULATORY_CARE_PROVIDER_SITE_OTHER): Payer: Self-pay | Admitting: Nurse Practitioner

## 2020-08-31 DIAGNOSIS — Z20822 Contact with and (suspected) exposure to covid-19: Secondary | ICD-10-CM | POA: Diagnosis not present

## 2020-08-31 DIAGNOSIS — Z01812 Encounter for preprocedural laboratory examination: Secondary | ICD-10-CM | POA: Insufficient documentation

## 2020-08-31 LAB — SARS CORONAVIRUS 2 (TAT 6-24 HRS): SARS Coronavirus 2: NEGATIVE

## 2020-08-31 NOTE — Progress Notes (Signed)
Subjective:    Patient ID: Lawrence [name], male    DOB: [birth year], 64 y.o.   MRN: [id number] Chief Complaint  Patient presents with  . 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  The patient is experiencing increased bleeding times following decannulation and increased recirculation with diminished efficiency of their dialysis. The patient denies an increase in arm swelling. At the present time the patient denies hand pain.  The patient denies amaurosis fugax or recent TIA symptoms. There are no recent neurological changes noted. The patient denies claudication symptoms or rest pain symptoms. The patient denies history of DVT, PE or superficial thrombophlebitis. The patient denies recent episodes of angina or shortness of breath.   There is a flow volume of 4250 with evidence of significant stenosis at the level of the confluence.  There is also evidence of steal, however the patient denies any extensive symptoms.     Review of Systems  Hematological: Bruises/bleeds easily.  All other systems reviewed and are negative.      Objective:   Physical Exam Vitals reviewed.  HENT:     Head: Normocephalic.  Cardiovascular:     Arteriovenous access: left arteriovenous access is present.    Comments: Left brachial cepahlic AVF .Marland KitchenMarland KitchenGood thrill and bruit, very aneurysmal  Neurological:     Mental Status: He is alert and oriented to person, place, and time.  Psychiatric:        Mood and Affect: Mood normal.        Behavior: Behavior normal.        Thought Content: Thought content normal.        Judgment: Judgment normal.     BP 93/62   Pulse 77   Ht 5\' 7"  (1.702 m)   Wt 156 lb (70.8 kg)   BMI 24.43 kg/m   Past Medical History:  Diagnosis Date  . Anemia   . Cerebral hemorrhage ([place]) 2012  . Chronic constipation   . Chronic kidney disease     . Diabetes mellitus without complication ([place])   . Hemodialysis patient ([place])   . Hyperlipidemia   . Hypertension   . Schizophrenia ([place])   . Stroke [facility])     Social History   Socioeconomic History  . Marital status: Single    Spouse name: Not on file  . Number of children: Not on file  . Years of education: Not on file  . Highest education level: Not on file  Occupational History  . Not on file  Tobacco Use  . Smoking status: Never Smoker  . Smokeless tobacco: Never Used  Substance and Sexual Activity  . Alcohol use: No  . Drug use: No  . Sexual activity: Not on file  Other Topics Concern  . Not on file  Social History Narrative  . Not on file   Social Determinants of Health   Financial Resource Strain:   . Difficulty of Paying Living Expenses: Not on file  Food Insecurity:   . Worried About Charity fundraiser in the Last Year: Not on file  . Ran Out of Food in the Last Year: Not on file  Transportation Needs:   . Lack of Transportation (Medical): Not on file  . Lack of Transportation (Non-Medical): Not on file  Physical Activity:   . Days of Exercise per Week: Not on file  .  Minutes of Exercise per Session: Not on file  Stress:   . Feeling of Stress : Not on file  Social Connections:   . Frequency of Communication with Friends and Family: Not on file  . Frequency of Social Gatherings with Friends and Family: Not on file  . Attends Religious Services: Not on file  . Active Member of Clubs or Organizations: Not on file  . Attends Archivist Meetings: Not on file  . Marital Status: Not on file  Intimate Partner Violence:   . Fear of Current or Ex-Partner: Not on file  . Emotionally Abused: Not on file  . Physically Abused: Not on file  . Sexually Abused: Not on file    Past Surgical History:  Procedure Laterality Date  . A/V FISTULAGRAM Left 02/24/2017   Procedure: A/V Fistulagram;  Surgeon: Katha Cabal, MD;  Location: [place] [facility];  Service: Cardiovascular;  Laterality: Left;  . PERIPHERAL VASCULAR CATHETERIZATION N/A 04/17/2015   Procedure: Dialysis/Perma Catheter Insertion;  Surgeon: Katha Cabal, MD;  Location: [place] [facility];  Service: Cardiovascular;  Laterality: N/A;  . PERIPHERAL VASCULAR CATHETERIZATION Left 07/30/2015   Procedure: A/V Shuntogram/Fistulagram;  Surgeon: Algernon Huxley, MD;  Location: [place] [facility];  Service: Cardiovascular;  Laterality: Left;  . PERIPHERAL VASCULAR CATHETERIZATION Left 07/30/2015   Procedure: A/V Shunt Intervention;  Surgeon: Algernon Huxley, MD;  Location: [place] [facility];  Service: Cardiovascular;  Laterality: Left;  . PERIPHERAL VASCULAR CATHETERIZATION N/A 08/20/2015   Procedure: DIALYSIS/PERMA CATHETER REMOVAL;  Surgeon: Algernon Huxley, MD;  Location: [place] [facility];  Service: Cardiovascular;  Laterality: N/A;  . VASCULAR SURGERY     Fistula placement    Family History  Problem Relation Age of Onset  . Diabetes Mother   . Kidney cancer Mother   . Diabetes Sister   . Stroke Brother   . Prostate cancer Neg Hx   . Bladder Cancer Neg Hx     Allergies  Allergen Reactions  . Nsaids Other (See Comments)    Cannot take due to renal disease       Assessment & Plan:   1. ESRD on dialysis [facility]) Recommend:  The patient is experiencing increasing problems with their dialysis access.  Patient should have a fistulagram with the intention for intervention.  The intention for intervention is to restore appropriate flow and prevent thrombosis and possible loss of the access.  As well as improve the quality of dialysis therapy.  The risks, benefits and alternative therapies were reviewed in detail with the patient.  All questions were answered.  The patient agrees to proceed with angio/intervention.      2. Essential hypertension Continue antihypertensive medications as already ordered, these medications have been reviewed and there are no changes at  this time.   3. Type 2 diabetes mellitus with chronic kidney disease on chronic dialysis, unspecified whether long term insulin use ([place]) Continue hypoglycemic medications as already ordered, these medications have been reviewed and there are no changes at this time.  Hgb A1C to be monitored as already arranged by primary service    Current Outpatient Medications on File Prior to Visit  Medication Sig Dispense Refill  . amLODipine (NORVASC) 2.5 MG tablet Take by mouth.    [name] BANOPHEN 25 MG capsule Take 25 mg by mouth See admin instructions. Take 1 capsule (25mg ) by mouth nightly as needed for sleep - may take 1 capsule (25mg ) by mouth during the  daytime as needed for itching    . benztropine (COGENTIN) 0.5 MG tablet Take 0.5 mg by mouth 2 (two) times daily.    . carvedilol (COREG) 25 MG tablet Take 25 mg by mouth 2 (two) times daily with a meal.     . cetirizine (ZYRTEC) 10 MG tablet Take 10 mg by mouth at bedtime.     . Cholecalciferol 125 MCG (5000 UT) TABS Take 1 capsule by mouth once a week.    . Cinacalcet HCl (SENSIPAR PO) Take by mouth.    . Coenzyme Q10 10 MG capsule Take by mouth.    . diphenhydrAMINE-Zinc Acetate (BENADRYL ITCH RELIEF STICK) 2-0.1 % STCK Apply 1 application topically in the morning, at noon, and at bedtime.    . docusate sodium (COLACE) 100 MG capsule Take 100 mg by mouth 2 (two) times daily.    . fluticasone (FLONASE) 50 MCG/ACT nasal spray Place into the nose.    [name] glipiZIDE (GLUCOTROL XL) 2.5 MG 24 hr tablet Take 2.5 mg by mouth daily with breakfast.     . haloperidol (HALDOL) 5 MG tablet Take 7.5 mg by mouth at bedtime.     . lidocaine-prilocaine (EMLA) cream Apply 1 application topically 3 (three) times a week. Apply over dialysis shunt 30 minutes prior to dialysis.    [name] mirtazapine (REMERON) 15 MG tablet Take 15 mg by mouth at bedtime.    . pravastatin (PRAVACHOL) 40 MG tablet Take 40 mg by mouth at bedtime.    [name] aspirin EC 81 MG tablet Take 81 mg by mouth  daily. (Patient not taking: Reported on 08/27/2020)    . losartan (COZAAR) 100 MG tablet Take 1 tablet (100 mg total) by mouth daily. (Patient not taking: Reported on 08/27/2020) 30 tablet 0  . Methoxy PEG-Epoetin Beta (MIRCERA IJ) Mircera (Patient not taking: Reported on 08/27/2020)    . Multiple Vitamin (MULTI-VITAMIN) tablet Take 1 tablet by mouth daily. (Patient not taking: Reported on 08/27/2020)    . Multiple Vitamins-Minerals (MULTIVITAMIN WITH MINERALS) tablet Take 1 tablet by mouth daily. (Patient not taking: Reported on 08/27/2020)    . niacin (NIASPAN) 1000 MG CR tablet Take 1,000 mg by mouth at bedtime. (Patient not taking: Reported on 08/27/2020)     No current facility-administered medications on file prior to visit.    There are no Patient Instructions on file for this visit. No follow-ups on file.   Kris Hartmann, NP

## 2020-08-31 NOTE — H&P (View-Only) (Signed)
Subjective:    Patient ID: Lawrence [name], male    DOB: [birth year], 64 y.o.   MRN: [id number] Chief Complaint  Patient presents with  . 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  The patient is experiencing increased bleeding times following decannulation and increased recirculation with diminished efficiency of their dialysis. The patient denies an increase in arm swelling. At the present time the patient denies hand pain.  The patient denies amaurosis fugax or recent TIA symptoms. There are no recent neurological changes noted. The patient denies claudication symptoms or rest pain symptoms. The patient denies history of DVT, PE or superficial thrombophlebitis. The patient denies recent episodes of angina or shortness of breath.   There is a flow volume of 4250 with evidence of significant stenosis at the level of the confluence.  There is also evidence of steal, however the patient denies any extensive symptoms.     Review of Systems  Hematological: Bruises/bleeds easily.  All other systems reviewed and are negative.      Objective:   Physical Exam Vitals reviewed.  HENT:     Head: Normocephalic.  Cardiovascular:     Arteriovenous access: left arteriovenous access is present.    Comments: Left brachial cepahlic AVF .Marland KitchenMarland KitchenGood thrill and bruit, very aneurysmal  Neurological:     Mental Status: He is alert and oriented to person, place, and time.  Psychiatric:        Mood and Affect: Mood normal.        Behavior: Behavior normal.        Thought Content: Thought content normal.        Judgment: Judgment normal.     BP 93/62   Pulse 77   Ht 5\' 7"  (1.702 m)   Wt 156 lb (70.8 kg)   BMI 24.43 kg/m   Past Medical History:  Diagnosis Date  . Anemia   . Cerebral hemorrhage ([place]) 2012  . Chronic constipation   . Chronic kidney disease     . Diabetes mellitus without complication ([place])   . Hemodialysis patient ([place])   . Hyperlipidemia   . Hypertension   . Schizophrenia ([place])   . Stroke [facility])     Social History   Socioeconomic History  . Marital status: Single    Spouse name: Not on file  . Number of children: Not on file  . Years of education: Not on file  . Highest education level: Not on file  Occupational History  . Not on file  Tobacco Use  . Smoking status: Never Smoker  . Smokeless tobacco: Never Used  Substance and Sexual Activity  . Alcohol use: No  . Drug use: No  . Sexual activity: Not on file  Other Topics Concern  . Not on file  Social History Narrative  . Not on file   Social Determinants of Health   Financial Resource Strain:   . Difficulty of Paying Living Expenses: Not on file  Food Insecurity:   . Worried About Charity fundraiser in the Last Year: Not on file  . Ran Out of Food in the Last Year: Not on file  Transportation Needs:   . Lack of Transportation (Medical): Not on file  . Lack of Transportation (Non-Medical): Not on file  Physical Activity:   . Days of Exercise per Week: Not on file  .  Minutes of Exercise per Session: Not on file  Stress:   . Feeling of Stress : Not on file  Social Connections:   . Frequency of Communication with Friends and Family: Not on file  . Frequency of Social Gatherings with Friends and Family: Not on file  . Attends Religious Services: Not on file  . Active Member of Clubs or Organizations: Not on file  . Attends Archivist Meetings: Not on file  . Marital Status: Not on file  Intimate Partner Violence:   . Fear of Current or Ex-Partner: Not on file  . Emotionally Abused: Not on file  . Physically Abused: Not on file  . Sexually Abused: Not on file    Past Surgical History:  Procedure Laterality Date  . A/V FISTULAGRAM Left 02/24/2017   Procedure: A/V Fistulagram;  Surgeon: Katha Cabal, MD;  Location: [place] [facility];  Service: Cardiovascular;  Laterality: Left;  . PERIPHERAL VASCULAR CATHETERIZATION N/A 04/17/2015   Procedure: Dialysis/Perma Catheter Insertion;  Surgeon: Katha Cabal, MD;  Location: [place] [facility];  Service: Cardiovascular;  Laterality: N/A;  . PERIPHERAL VASCULAR CATHETERIZATION Left 07/30/2015   Procedure: A/V Shuntogram/Fistulagram;  Surgeon: Algernon Huxley, MD;  Location: [place] [facility];  Service: Cardiovascular;  Laterality: Left;  . PERIPHERAL VASCULAR CATHETERIZATION Left 07/30/2015   Procedure: A/V Shunt Intervention;  Surgeon: Algernon Huxley, MD;  Location: [place] [facility];  Service: Cardiovascular;  Laterality: Left;  . PERIPHERAL VASCULAR CATHETERIZATION N/A 08/20/2015   Procedure: DIALYSIS/PERMA CATHETER REMOVAL;  Surgeon: Algernon Huxley, MD;  Location: [place] [facility];  Service: Cardiovascular;  Laterality: N/A;  . VASCULAR SURGERY     Fistula placement    Family History  Problem Relation Age of Onset  . Diabetes Mother   . Kidney cancer Mother   . Diabetes Sister   . Stroke Brother   . Prostate cancer Neg Hx   . Bladder Cancer Neg Hx     Allergies  Allergen Reactions  . Nsaids Other (See Comments)    Cannot take due to renal disease       Assessment & Plan:   1. ESRD on dialysis [facility]) Recommend:  The patient is experiencing increasing problems with their dialysis access.  Patient should have a fistulagram with the intention for intervention.  The intention for intervention is to restore appropriate flow and prevent thrombosis and possible loss of the access.  As well as improve the quality of dialysis therapy.  The risks, benefits and alternative therapies were reviewed in detail with the patient.  All questions were answered.  The patient agrees to proceed with angio/intervention.      2. Essential hypertension Continue antihypertensive medications as already ordered, these medications have been reviewed and there are no changes at  this time.   3. Type 2 diabetes mellitus with chronic kidney disease on chronic dialysis, unspecified whether long term insulin use ([place]) Continue hypoglycemic medications as already ordered, these medications have been reviewed and there are no changes at this time.  Hgb A1C to be monitored as already arranged by primary service    Current Outpatient Medications on File Prior to Visit  Medication Sig Dispense Refill  . amLODipine (NORVASC) 2.5 MG tablet Take by mouth.    [name] BANOPHEN 25 MG capsule Take 25 mg by mouth See admin instructions. Take 1 capsule (25mg ) by mouth nightly as needed for sleep - may take 1 capsule (25mg ) by mouth during the  daytime as needed for itching    . benztropine (COGENTIN) 0.5 MG tablet Take 0.5 mg by mouth 2 (two) times daily.    . carvedilol (COREG) 25 MG tablet Take 25 mg by mouth 2 (two) times daily with a meal.     . cetirizine (ZYRTEC) 10 MG tablet Take 10 mg by mouth at bedtime.     . Cholecalciferol 125 MCG (5000 UT) TABS Take 1 capsule by mouth once a week.    . Cinacalcet HCl (SENSIPAR PO) Take by mouth.    . Coenzyme Q10 10 MG capsule Take by mouth.    . diphenhydrAMINE-Zinc Acetate (BENADRYL ITCH RELIEF STICK) 2-0.1 % STCK Apply 1 application topically in the morning, at noon, and at bedtime.    . docusate sodium (COLACE) 100 MG capsule Take 100 mg by mouth 2 (two) times daily.    . fluticasone (FLONASE) 50 MCG/ACT nasal spray Place into the nose.    [name] glipiZIDE (GLUCOTROL XL) 2.5 MG 24 hr tablet Take 2.5 mg by mouth daily with breakfast.     . haloperidol (HALDOL) 5 MG tablet Take 7.5 mg by mouth at bedtime.     . lidocaine-prilocaine (EMLA) cream Apply 1 application topically 3 (three) times a week. Apply over dialysis shunt 30 minutes prior to dialysis.    [name] mirtazapine (REMERON) 15 MG tablet Take 15 mg by mouth at bedtime.    . pravastatin (PRAVACHOL) 40 MG tablet Take 40 mg by mouth at bedtime.    [name] aspirin EC 81 MG tablet Take 81 mg by mouth  daily. (Patient not taking: Reported on 08/27/2020)    . losartan (COZAAR) 100 MG tablet Take 1 tablet (100 mg total) by mouth daily. (Patient not taking: Reported on 08/27/2020) 30 tablet 0  . Methoxy PEG-Epoetin Beta (MIRCERA IJ) Mircera (Patient not taking: Reported on 08/27/2020)    . Multiple Vitamin (MULTI-VITAMIN) tablet Take 1 tablet by mouth daily. (Patient not taking: Reported on 08/27/2020)    . Multiple Vitamins-Minerals (MULTIVITAMIN WITH MINERALS) tablet Take 1 tablet by mouth daily. (Patient not taking: Reported on 08/27/2020)    . niacin (NIASPAN) 1000 MG CR tablet Take 1,000 mg by mouth at bedtime. (Patient not taking: Reported on 08/27/2020)     No current facility-administered medications on file prior to visit.    There are no Patient Instructions on file for this visit. No follow-ups on file.   Kris Hartmann, NP

## 2020-09-03 ENCOUNTER — Ambulatory Visit
Admission: RE | Admit: 2020-09-03 | Discharge: 2020-09-03 | Disposition: A | Discharge status: Home / Self Care | Payer: Medicare Other | Attending: Vascular Surgery | Admitting: Vascular Surgery

## 2020-09-03 ENCOUNTER — Encounter: Payer: Self-pay | Admitting: Vascular Surgery

## 2020-09-03 ENCOUNTER — Other Ambulatory Visit (INDEPENDENT_AMBULATORY_CARE_PROVIDER_SITE_OTHER): Payer: Self-pay | Admitting: Nurse Practitioner

## 2020-09-03 ENCOUNTER — Encounter
Admission: RE | Disposition: A | Discharge status: Home / Self Care | Payer: Self-pay | Source: Home / Self Care | Attending: Vascular Surgery

## 2020-09-03 ENCOUNTER — Other Ambulatory Visit: Payer: Self-pay

## 2020-09-03 DIAGNOSIS — Z8673 Personal history of transient ischemic attack (TIA), and cerebral infarction without residual deficits: Secondary | ICD-10-CM | POA: Insufficient documentation

## 2020-09-03 DIAGNOSIS — Z79899 Other long term (current) drug therapy: Secondary | ICD-10-CM | POA: Diagnosis not present

## 2020-09-03 DIAGNOSIS — E1122 Type 2 diabetes mellitus with diabetic chronic kidney disease: Secondary | ICD-10-CM | POA: Insufficient documentation

## 2020-09-03 DIAGNOSIS — E785 Hyperlipidemia, unspecified: Secondary | ICD-10-CM | POA: Diagnosis not present

## 2020-09-03 DIAGNOSIS — Z7982 Long term (current) use of aspirin: Secondary | ICD-10-CM | POA: Insufficient documentation

## 2020-09-03 DIAGNOSIS — N186 End stage renal disease: Secondary | ICD-10-CM | POA: Insufficient documentation

## 2020-09-03 DIAGNOSIS — Y841 Kidney dialysis as the cause of abnormal reaction of the patient, or of later complication, without mention of misadventure at the time of the procedure: Secondary | ICD-10-CM | POA: Insufficient documentation

## 2020-09-03 DIAGNOSIS — F209 Schizophrenia, unspecified: Secondary | ICD-10-CM | POA: Insufficient documentation

## 2020-09-03 DIAGNOSIS — T82858A Stenosis of vascular prosthetic devices, implants and grafts, initial encounter: Secondary | ICD-10-CM | POA: Diagnosis not present

## 2020-09-03 DIAGNOSIS — Z7984 Long term (current) use of oral hypoglycemic drugs: Secondary | ICD-10-CM | POA: Diagnosis not present

## 2020-09-03 DIAGNOSIS — T82898A Other specified complication of vascular prosthetic devices, implants and grafts, initial encounter: Secondary | ICD-10-CM | POA: Diagnosis not present

## 2020-09-03 DIAGNOSIS — I120 Hypertensive chronic kidney disease with stage 5 chronic kidney disease or end stage renal disease: Secondary | ICD-10-CM | POA: Diagnosis not present

## 2020-09-03 DIAGNOSIS — Z992 Dependence on renal dialysis: Secondary | ICD-10-CM | POA: Insufficient documentation

## 2020-09-03 HISTORY — PX: A/V FISTULAGRAM: CATH118298

## 2020-09-03 LAB — GLUCOSE, CAPILLARY
Glucose-Capillary: 109 mg/dL — ABNORMAL HIGH (ref 70–99)
Glucose-Capillary: 68 mg/dL — ABNORMAL LOW (ref 70–99)

## 2020-09-03 SURGERY — A/V FISTULAGRAM
Anesthesia: Moderate Sedation | Laterality: Left

## 2020-09-03 MED ORDER — METHYLPREDNISOLONE SODIUM SUCC 125 MG IJ SOLR: 125.0000 mg | Freq: Once | INTRAMUSCULAR | Status: DC | PRN

## 2020-09-03 MED ORDER — HEPARIN SODIUM (PORCINE) 1000 UNIT/ML IJ SOLN
INTRAMUSCULAR | Status: AC
  Filled 2020-09-03: qty 1

## 2020-09-03 MED ORDER — FAMOTIDINE 20 MG PO TABS: 40.0000 mg | ORAL_TABLET | Freq: Once | ORAL | Status: DC | PRN

## 2020-09-03 MED ORDER — MIDAZOLAM HCL 2 MG/ML PO SYRP: 8.0000 mg | ORAL_SOLUTION | Freq: Once | ORAL | Status: DC | PRN

## 2020-09-03 MED ORDER — MIDAZOLAM HCL 5 MG/5ML IJ SOLN
INTRAMUSCULAR | Status: AC
  Filled 2020-09-03: qty 5

## 2020-09-03 MED ORDER — CEFAZOLIN SODIUM-DEXTROSE 1-4 GM/50ML-% IV SOLN: 1.0000 g | Freq: Once | INTRAVENOUS | Status: AC

## 2020-09-03 MED ORDER — IODIXANOL 320 MG/ML IV SOLN
INTRAVENOUS | Status: DC | PRN
  Administered 2020-09-03: 20 mL

## 2020-09-03 MED ORDER — SODIUM CHLORIDE 0.9 % IV SOLN: INTRAVENOUS | Status: DC

## 2020-09-03 MED ORDER — ONDANSETRON HCL 4 MG/2ML IJ SOLN: 4.0000 mg | Freq: Four times a day (QID) | INTRAMUSCULAR | Status: DC | PRN

## 2020-09-03 MED ORDER — CEFAZOLIN SODIUM-DEXTROSE 1-4 GM/50ML-% IV SOLN
INTRAVENOUS | Status: AC
  Administered 2020-09-03: 1 g via INTRAVENOUS
  Filled 2020-09-03: qty 50

## 2020-09-03 MED ORDER — HYDROMORPHONE HCL 1 MG/ML IJ SOLN: 1.0000 mg | Freq: Once | INTRAMUSCULAR | Status: DC | PRN

## 2020-09-03 MED ORDER — MIDAZOLAM HCL 2 MG/2ML IJ SOLN
INTRAMUSCULAR | Status: DC | PRN
  Administered 2020-09-03: 2 mg via INTRAVENOUS

## 2020-09-03 MED ORDER — DIPHENHYDRAMINE HCL 50 MG/ML IJ SOLN: 50.0000 mg | Freq: Once | INTRAMUSCULAR | Status: DC | PRN

## 2020-09-03 MED ORDER — FENTANYL CITRATE (PF) 100 MCG/2ML IJ SOLN
INTRAMUSCULAR | Status: AC
  Filled 2020-09-03: qty 2

## 2020-09-03 MED ORDER — FENTANYL CITRATE (PF) 100 MCG/2ML IJ SOLN
INTRAMUSCULAR | Status: DC | PRN
Start: 2020-09-03 — End: 2020-09-03
  Administered 2020-09-03: 50 ug via INTRAVENOUS

## 2020-09-03 SURGICAL SUPPLY — 9 items
BALLN LUTONIX AV 8X60X75 (BALLOONS) ×2
BALLOON LUTONIX AV 8X60X75 (BALLOONS) ×1 IMPLANT
CANNULA 5F STIFF (CANNULA) ×2 IMPLANT
DRAPE BRACHIAL (DRAPES) ×2 IMPLANT
KIT ENCORE 26 ADVANTAGE (KITS) ×2 IMPLANT
PACK ANGIOGRAPHY (CUSTOM PROCEDURE TRAY) ×2 IMPLANT
SHEATH BRITE TIP 6FRX5.5 (SHEATH) ×2 IMPLANT
SUT MNCRL AB 4-0 PS2 18 (SUTURE) ×2 IMPLANT
WIRE MAGIC TOR.035 180C (WIRE) ×2 IMPLANT

## 2020-09-03 NOTE — Op Note (Signed)
[place] [facility]    OPERATIVE NOTE   PROCEDURE: 1.   Left brachiocephalic arteriovenous fistula cannulation under ultrasound guidance 2.   Left arm fistulagram including central venogram 3.   Percutaneous transluminal angioplasty of the left cephalic vein subclavian vein confluence with 8 mm diameter by 6 cm length Lutonix drug-coated angioplasty balloon  PRE-OPERATIVE DIAGNOSIS: 1. ESRD 2. Poorly functional, markedly aneurysmal left brachiocephalic AVF  POST-OPERATIVE DIAGNOSIS: same as above   SURGEON: [name], MD  ANESTHESIA: local with MCS  ESTIMATED BLOOD LOSS: 3 cc  FINDING(S): 1. Left brachiocephalic AV fistula was markedly aneurysmal and moderately tortuous.  There were no focal stenoses until the cephalic vein subclavian vein confluence where there was a 75 to 80% stenosis.  The subclavian vein just beyond this normalized, and then the remainder of the central venous circulation was widely patent.  SPECIMEN(S):  None  CONTRAST: 20 cc  FLUORO TIME: 0.9 minutes  MODERATE CONSCIOUS SEDATION TIME: Approximately 23 minutes with 2 mg of Versed and 50 mcg of Fentanyl   INDICATIONS: Lawrence [name] is a 64 y.o. male who presents with malfunctioning, markedly aneurysmal left brachiocephalic arteriovenous fistula.  The patient is scheduled for left arm fistulagram.  The patient is aware the risks include but are not limited to: bleeding, infection, thrombosis of the cannulated access, and possible anaphylactic reaction to the contrast.  The patient is aware of the risks of the procedure and elects to proceed forward.  DESCRIPTION: After full informed written consent was obtained, the patient was brought back to the angiography suite and placed supine upon the angiography table.  The patient was connected to monitoring equipment. Moderate conscious sedation was administered with a face to face encounter with the patient throughout the procedure with my  supervision of the RN administering medicines and monitoring the patient's vital signs and mental status throughout from the start of the procedure until the patient was taken to the recovery room. The left arm was prepped and draped in the standard fashion for a percutaneous access intervention.  Under ultrasound guidance, the left brachiocephalic arteriovenous fistula was cannulated with a micropuncture needle under direct ultrasound guidance where it was patent and a permanent image was performed.  The microwire was advanced into the fistula and the needle was exchanged for the a microsheath.  I then upsized to a 6 Fr Sheath and imaging was performed.  Hand injections were completed to image the access including the central venous system. This demonstrated Left brachiocephalic AV fistula was markedly aneurysmal and moderately tortuous.  There were no focal stenoses until the cephalic vein subclavian vein confluence where there was a 75 to 80% stenosis.  The subclavian vein just beyond this normalized, and then the remainder of the central venous circulation was widely patent.  Based on the images, this patient will need intervention to the stenosis at the cephalic vein subclavian vein confluence. I then gave the patient 3000 units of intravenous heparin.  I then crossed the stenosis with a Magic Tourqe wire.  Based on the imaging, a 8 mm x 6 cm Lutonix drug-coated angioplasty balloon was selected.  The balloon was centered around the cephalic vein subclavian vein confluence stenosis with the proximal extent of the balloon going a centimeter or 2 into the subclavian vein and inflated to 12 ATM for 1 minute(s).  On completion imaging, a 25% residual stenosis was present.  This was markedly improved, and I did not want to be too aggressive in the spot  as the aneurysmal vein would require a very large covered stent if we had extravasation.   Based on the completion imaging, no further intervention is necessary.   The wire and balloon were removed from the sheath.  A 4-0 Monocryl purse-string suture was sewn around the sheath.  The sheath was removed while tying down the suture.  A sterile bandage was applied to the puncture site.  COMPLICATIONS: None  CONDITION: Stable   [name]  09/03/2020 9:43 AM   This note was created with Dragon Medical transcription system. Any errors in dictation are purely unintentional.

## 2020-09-03 NOTE — Discharge Instructions (Signed)
Moderate Conscious Sedation, Adult, Care After These instructions provide you with information about caring for yourself after your procedure. Your health care provider may also give you more specific instructions. Your treatment has been planned according to current medical practices, but problems sometimes occur. Call your health care provider if you have any problems or questions after your procedure. What can I expect after the procedure? After your procedure, it is common:  To feel sleepy for several hours.  To feel clumsy and have poor balance for several hours.  To have poor judgment for several hours.  To vomit if you eat too soon. Follow these instructions at home: For at least 24 hours after the procedure:   Do not: ? Participate in activities where you could fall or become injured. ? Drive. ? Use heavy machinery. ? Drink alcohol. ? Take sleeping pills or medicines that cause drowsiness. ? Make important decisions or sign legal documents. ? Take care of children on your own.  Rest. Eating and drinking  Follow the diet recommended by your health care provider.  If you vomit: ? Drink water, juice, or soup when you can drink without vomiting. ? Make sure you have little or no nausea before eating solid foods. General instructions  Have a responsible adult stay with you until you are awake and alert.  Take over-the-counter and prescription medicines only as told by your health care provider.  If you smoke, do not smoke without supervision.  Keep all follow-up visits as told by your health care provider. This is important. Contact a health care provider if:  You keep feeling nauseous or you keep vomiting.  You feel light-headed.  You develop a rash.  You have a fever. Get help right away if:  You have trouble breathing. This information is not intended to replace advice given to you by your health care provider. Make sure you discuss any questions you have  with your health care provider. Document Revised: 11/06/2017 Document Reviewed: 03/15/2016 Elsevier Patient Education  [place]. Dialysis Fistulogram  A dialysis fistulogram is an X-ray test to look inside the site where blood is removed and returned to your body during dialysis. Fistulas and grafts provide easy and effective access for dialysis. However, sometimes problems can occur. The fistula or graft can become clogged or narrow. This can make dialysis less effective. You may need to have a fistulogram to check for these problems. If a problem is found, your health care provider can do treatments during the procedure to clear the blocked or narrowed areas. Treatments to open a blocked dialysis fistula or graft may include:  Removing the clot from the fistula or graft with a device (thrombectomy).  A procedure to open or widen the blocked area with a balloon (angioplasty).  Placing a small mesh tube (stent) to keep the fistula or graft open.  Injecting a medicine to dissolve the clot (thrombolysis). Tell a health care provider about:  Any allergies you have, including any reactions you have had to contrast dye.  All medicines you are taking, including vitamins, herbs, eye drops, creams, and over-the-counter medicines.  Any problems you or family members have had with anesthetic medicines.  Any blood disorders you have.  Any surgeries you have had.  Any medical conditions you have.  Whether you are pregnant or may be pregnant.  Any pain, redness, or swelling you have in your limb that has the dialysis fistula or graft.  Any bleeding from your dialysis fistula or graft.  Any of the following in the part of your body where the dialysis fistula or graft leads: ? Numbness. ? Tingling. ? A cold feeling. ? Areas that are bluish or pale white in color. What are the risks? Generally, this is a safe procedure. However, problems may occur,  including:  Infection.  Bleeding.  Allergic reactions to medicines or dyes.  Damage to other structures, such as nearby nerves or blood vessels.  A blood clot in the dialysis fistula or graft.  A blood clot that travels to your lungs (pulmonary embolism). What happens before the procedure? General instructions  Follow instructions from your health care provider about eating and drinking restrictions.  Ask your health care provider about: ? Changing or stopping your regular medicines. This is especially important if you are taking diabetes medicines or blood thinners (anticoagulants). ? Taking medicines such as aspirin and ibuprofen. These medicines can thin your blood. Do not take these medicines unless your health care provider tells you to take them. ? Taking over-the-counter medicines, vitamins, herbs, and supplements.  Plan to have someone take you home from the hospital or clinic.  Plan to have a responsible adult care for you for at least 24 hours after you leave the hospital or clinic. This is important.  You may have a blood or urine sample taken. These will help your health care provider learn how well your kidneys and liver are working and how well your blood clots. What happens during the procedure?  An IV will be inserted into one of your veins.  You will be given one or more of the following: ? A medicine to help you relax (sedative). ? A medicine to numb the area (local anesthetic) on the limb with your fistula or graft.  A needle will be inserted into your vein.  A small, thin tube (catheter) will be inserted into the needle and guided to the area of possible problems.  Dye (contrast material) will be injected into the catheter. As the contrast material passes through your body, you may feel warm.  X-rays will be taken. The contrast material will show where any narrowing or blockages are.  If narrowing or a blockage is seen, the health care provider may do  one of the following to open the blockage: ? Thrombectomy. The health care provider will use a mechanical device at the end of the catheter to break up the clot. ? Angioplasty. The health care provider will advance a guide wire and balloon-tipped catheter to the blockage site. The balloon will be inflated for a short period of time. A stent may also be placed to keep the blocked area open. ? Thrombolysis. The health care provider will deliver a clot-dissolving medicine through the catheter.  When the procedure is complete, the catheter will be removed.  In some cases, the catheter site will be closed with stitches (sutures).  Pressure will be applied to stop any bleeding, and your skin will be covered with a bandage (dressing). The procedure may vary among health care providers and hospitals. What happens after the procedure?  Your blood pressure, heart rate, breathing rate, and blood oxygen level will be monitored until you leave the hospital or clinic. Your health care provider will also monitor your access site.  You will need to stay in bed for several hours.  Do not drive for 24 hours if you were given a sedative during your procedure. Summary  A dialysis fistulogram is an X-ray test to look inside the site  where blood is removed and returned to your body during dialysis.  If a problem is found, your health care provider can also do treatments during the procedure to clear the blocked or narrowed areas. This information is not intended to replace advice given to you by your health care provider. Make sure you discuss any questions you have with your health care provider. Document Revised: 12/25/2017 Document Reviewed: 12/25/2017 Elsevier Patient Education  2020 [employer].

## 2020-09-03 NOTE — Interval H&P Note (Signed)
History and Physical Interval Note:  09/03/2020 8:22 AM  Lawrence [name]  has presented today for surgery, with the diagnosis of LUE Fistulagram   End Stage Renal Covid Sept 24.  The various methods of treatment have been discussed with the patient and family. After consideration of risks, benefits and other options for treatment, the patient has consented to  Procedure(s): A/V FISTULAGRAM (Left) as a surgical intervention.  The patient's history has been reviewed, patient examined, no change in status, stable for surgery.  I have reviewed the patient's chart and labs.  Questions were answered to the patient's satisfaction.     [name]

## 2020-10-11 ENCOUNTER — Other Ambulatory Visit (INDEPENDENT_AMBULATORY_CARE_PROVIDER_SITE_OTHER): Payer: Self-pay | Admitting: Vascular Surgery

## 2020-10-11 DIAGNOSIS — Z9862 Peripheral vascular angioplasty status: Secondary | ICD-10-CM

## 2020-10-11 DIAGNOSIS — T829XXS Unspecified complication of cardiac and vascular prosthetic device, implant and graft, sequela: Secondary | ICD-10-CM

## 2020-10-15 ENCOUNTER — Ambulatory Visit (INDEPENDENT_AMBULATORY_CARE_PROVIDER_SITE_OTHER): Payer: Medicare Other

## 2020-10-15 ENCOUNTER — Encounter (INDEPENDENT_AMBULATORY_CARE_PROVIDER_SITE_OTHER): Payer: Self-pay | Admitting: Nurse Practitioner

## 2020-10-15 ENCOUNTER — Other Ambulatory Visit: Payer: Self-pay

## 2020-10-15 ENCOUNTER — Ambulatory Visit (INDEPENDENT_AMBULATORY_CARE_PROVIDER_SITE_OTHER): Payer: Medicare Other | Admitting: Nurse Practitioner

## 2020-10-15 VITALS — BP 109/75 | HR 75 | Ht 67.0 in | Wt 160.0 lb

## 2020-10-15 DIAGNOSIS — N186 End stage renal disease: Secondary | ICD-10-CM

## 2020-10-15 DIAGNOSIS — E1122 Type 2 diabetes mellitus with diabetic chronic kidney disease: Secondary | ICD-10-CM

## 2020-10-15 DIAGNOSIS — I10 Essential (primary) hypertension: Secondary | ICD-10-CM | POA: Diagnosis not present

## 2020-10-15 DIAGNOSIS — Z992 Dependence on renal dialysis: Secondary | ICD-10-CM

## 2020-10-15 DIAGNOSIS — T829XXS Unspecified complication of cardiac and vascular prosthetic device, implant and graft, sequela: Secondary | ICD-10-CM

## 2020-10-15 DIAGNOSIS — Z9862 Peripheral vascular angioplasty status: Secondary | ICD-10-CM | POA: Diagnosis not present

## 2020-10-21 ENCOUNTER — Encounter (INDEPENDENT_AMBULATORY_CARE_PROVIDER_SITE_OTHER): Payer: Self-pay | Admitting: Nurse Practitioner

## 2020-10-21 NOTE — Progress Notes (Signed)
Subjective:    Patient ID: Lawrence [name], male    DOB: [birth year], 64 y.o.   MRN: [id number] Chief Complaint  Patient presents with  . Follow-up    6 wks [facility] A/V  fistula gram hda     The patient returns to the office for followup status post intervention of the dialysis access left brachiocephalic AV fistula. Following the intervention the access function has significantly improved, with better flow rates and improved KT/V. The patient has not been experiencing increased bleeding times following decannulation and the patient denies increased recirculation. The patient denies an increase in arm swelling. At the present time the patient denies hand pain.  The patient denies amaurosis fugax or recent TIA symptoms. There are no recent neurological changes noted. The patient denies claudication symptoms or rest pain symptoms. The patient denies history of DVT, PE or superficial thrombophlebitis. The patient denies recent episodes of angina or shortness of breath.   Patient has a flow volume of 1037.  There is an area of increased velocities at the confluence post angioplasty.But patient notes dialysis quality is improved      Review of Systems  Hematological: Does not bruise/bleed easily.  All other systems reviewed and are negative.      Objective:   Physical Exam Vitals reviewed.  HENT:     Head: Normocephalic.  Cardiovascular:     Rate and Rhythm: Normal rate and regular rhythm.     Pulses:          Radial pulses are 1+ on the left side.     Arteriovenous access: left arteriovenous access is present.    Comments: Good thrill and bruit of left brachiocephalic AV fistula but very aneurysmal Pulmonary:     Effort: Pulmonary effort is normal.  Skin:    General: Skin is warm and dry.  Neurological:     Mental Status: He is alert and oriented to person, place, and time.  Psychiatric:        Mood and Affect: Mood normal.        Behavior: Behavior normal.        Thought  Content: Thought content normal.        Judgment: Judgment normal.     BP 109/75   Pulse 75   Ht 5\' 7"  (1.702 m)   Wt 160 lb (72.6 kg)   BMI 25.06 kg/m   Past Medical History:  Diagnosis Date  . Anemia   . Cerebral hemorrhage ([place]) 2012  . Chronic constipation   . Chronic kidney disease   . Diabetes mellitus without complication ([place])   . Hemodialysis patient ([place])   . Hyperlipidemia   . Hypertension   . Schizophrenia ([place])   . Stroke [facility])     Social History   Socioeconomic History  . Marital status: Single    Spouse name: Not on file  . Number of children: Not on file  . Years of education: Not on file  . Highest education level: Not on file  Occupational History  . Not on file  Tobacco Use  . Smoking status: Never Smoker  . Smokeless tobacco: Never Used  Substance and Sexual Activity  . Alcohol use: No  . Drug use: No  . Sexual activity: Not on file  Other Topics Concern  . Not on file  Social History Narrative  . Not on file   Social Determinants of Health   Financial Resource Strain:   . Difficulty of Paying Living Expenses: Not on  file  Food Insecurity:   . Worried About Charity fundraiser in the Last Year: Not on file  . Ran Out of Food in the Last Year: Not on file  Transportation Needs:   . Lack of Transportation (Medical): Not on file  . Lack of Transportation (Non-Medical): Not on file  Physical Activity:   . Days of Exercise per Week: Not on file  . Minutes of Exercise per Session: Not on file  Stress:   . Feeling of Stress : Not on file  Social Connections:   . Frequency of Communication with Friends and Family: Not on file  . Frequency of Social Gatherings with Friends and Family: Not on file  . Attends Religious Services: Not on file  . Active Member of Clubs or Organizations: Not on file  . Attends Archivist Meetings: Not on file  . Marital Status: Not on file  Intimate Partner Violence:   . Fear of Current or  Ex-Partner: Not on file  . Emotionally Abused: Not on file  . Physically Abused: Not on file  . Sexually Abused: Not on file    Past Surgical History:  Procedure Laterality Date  . A/V FISTULAGRAM Left 02/24/2017   Procedure: A/V Fistulagram;  Surgeon: Katha Cabal, MD;  Location: [place] [facility];  Service: Cardiovascular;  Laterality: Left;  . A/V FISTULAGRAM Left 09/03/2020   Procedure: A/V FISTULAGRAM;  Surgeon: Algernon Huxley, MD;  Location: [place] [facility];  Service: Cardiovascular;  Laterality: Left;  . PERIPHERAL VASCULAR CATHETERIZATION N/A 04/17/2015   Procedure: Dialysis/Perma Catheter Insertion;  Surgeon: Katha Cabal, MD;  Location: [place] [facility];  Service: Cardiovascular;  Laterality: N/A;  . PERIPHERAL VASCULAR CATHETERIZATION Left 07/30/2015   Procedure: A/V Shuntogram/Fistulagram;  Surgeon: Algernon Huxley, MD;  Location: [place] [facility];  Service: Cardiovascular;  Laterality: Left;  . PERIPHERAL VASCULAR CATHETERIZATION Left 07/30/2015   Procedure: A/V Shunt Intervention;  Surgeon: Algernon Huxley, MD;  Location: [place] [facility];  Service: Cardiovascular;  Laterality: Left;  . PERIPHERAL VASCULAR CATHETERIZATION N/A 08/20/2015   Procedure: DIALYSIS/PERMA CATHETER REMOVAL;  Surgeon: Algernon Huxley, MD;  Location: [place] [facility];  Service: Cardiovascular;  Laterality: N/A;  . VASCULAR SURGERY     Fistula placement    Family History  Problem Relation Age of Onset  . Diabetes Mother   . Kidney cancer Mother   . Diabetes Sister   . Stroke Brother   . Prostate cancer Neg Hx   . Bladder Cancer Neg Hx     Allergies  Allergen Reactions  . Nsaids Other (See Comments)    Cannot take due to renal disease    CBC Latest Ref Rng & Units 03/08/2020 05/13/2019 04/11/2019  WBC 4.0 - 10.5 K/uL 2.9(L) 3.7(L) 3.3(L)  Hemoglobin 13.0 - 17.0 g/dL 10.0(L) 10.5(L) 10.6(L)  Hematocrit 39 - 52 % 30.6(L) 32.9(L) 32.9(L)  Platelets 150 - 400 K/uL 80(L) 86(L) 78(L)       CMP     Component Value Date/Time   NA 135 03/08/2020 1756   NA 140 03/21/2015 1512   K 3.0 (L) 03/08/2020 1756   K 4.0 03/21/2015 1512   CL 93 (L) 03/08/2020 1756   CL 95 (L) 03/21/2015 1512   CO2 29 03/08/2020 1756   CO2 36 (H) 03/21/2015 1512   GLUCOSE 123 (H) 03/08/2020 1756   GLUCOSE 117 (H) 03/21/2015 1512   BUN 11 03/08/2020 1756   BUN 64 (  H) 03/21/2015 1512   CREATININE 3.46 (H) 03/08/2020 1756   CREATININE 7.71 (H) 03/21/2015 1512   CALCIUM 8.7 (L) 03/08/2020 1756   CALCIUM 8.5 (L) 03/21/2015 1512   PROT 6.6 03/08/2020 1756   PROT 5.8 (L) 12/13/2014 1354   ALBUMIN 3.3 (L) 03/08/2020 1756   ALBUMIN 2.5 (L) 12/13/2014 1354   AST 41 03/08/2020 1756   AST 17 12/13/2014 1354   ALT 25 03/08/2020 1756   ALT 21 12/13/2014 1354   ALKPHOS 83 03/08/2020 1756   ALKPHOS 50 12/13/2014 1354   BILITOT 0.8 03/08/2020 1756   BILITOT 0.2 12/13/2014 1354   GFRNONAA 18 (L) 03/08/2020 1756   GFRNONAA 7 (L) 03/21/2015 1512   GFRAA 21 (L) 03/08/2020 1756   GFRAA 8 (L) 03/21/2015 1512     No results found.     Assessment & Plan:   1. Complication of vascular access for dialysis, sequela Recommend:  The patient is doing well and currently has adequate dialysis access. The patient's dialysis center is not reporting any access issues. Flow pattern is stable when compared to the prior ultrasound.  It is noted that there is increased velocities near the confluence however the patient denies any issues currently during dialysis.  The patient should have a duplex ultrasound of the dialysis access in 6 months. The patient will follow-up with me in the office after each ultrasound     2. Essential hypertension Continue antihypertensive medications as already ordered, these medications have been reviewed and there are no changes at this time.   3. Type 2 diabetes mellitus with chronic kidney disease on chronic dialysis, unspecified whether long term insulin use  ([place]) Continue hypoglycemic medications as already ordered, these medications have been reviewed and there are no changes at this time.  Hgb A1C to be monitored as already arranged by primary service    Current Outpatient Medications on File Prior to Visit  Medication Sig Dispense Refill  . amLODipine (NORVASC) 2.5 MG tablet Take by mouth.    [name] aspirin EC 81 MG tablet Take 81 mg by mouth daily.     [name] BANOPHEN 25 MG capsule Take 25 mg by mouth See admin instructions. Take 1 capsule (25mg ) by mouth nightly as needed for sleep - may take 1 capsule (25mg ) by mouth during the daytime as needed for itching    . benztropine (COGENTIN) 0.5 MG tablet Take 0.5 mg by mouth 2 (two) times daily.    . carvedilol (COREG) 25 MG tablet Take 25 mg by mouth 2 (two) times daily with a meal.     . cetirizine (ZYRTEC) 10 MG tablet Take 10 mg by mouth at bedtime.     . Cholecalciferol 125 MCG (5000 UT) TABS Take 1 capsule by mouth once a week.    . Cinacalcet HCl (SENSIPAR PO) Take by mouth.    . Coenzyme Q10 10 MG capsule Take by mouth.     . diphenhydrAMINE-Zinc Acetate (BENADRYL ITCH RELIEF STICK) 2-0.1 % STCK Apply 1 application topically in the morning, at noon, and at bedtime.     . docusate sodium (COLACE) 100 MG capsule Take 100 mg by mouth 2 (two) times daily.    . fluticasone (FLONASE) 50 MCG/ACT nasal spray Place into the nose.    [name] glipiZIDE (GLUCOTROL XL) 2.5 MG 24 hr tablet Take 2.5 mg by mouth daily with breakfast.     . haloperidol (HALDOL) 10 MG tablet Take by mouth.    . haloperidol (HALDOL) 5  MG tablet Take 7.5 mg by mouth at bedtime.     . lidocaine-prilocaine (EMLA) cream Apply 1 application topically 3 (three) times a week. Apply over dialysis shunt 30 minutes prior to dialysis.    [name] losartan (COZAAR) 100 MG tablet Take 1 tablet (100 mg total) by mouth daily. 30 tablet 0  . Methoxy PEG-Epoetin Beta (MIRCERA IJ) Mircera    . mirtazapine (REMERON) 15 MG tablet Take 15 mg by mouth at bedtime.     . Multiple Vitamin (MULTI-VITAMIN) tablet Take 1 tablet by mouth daily.     . Multiple Vitamins-Minerals (MULTIVITAMIN WITH MINERALS) tablet Take 1 tablet by mouth daily.     . niacin (NIASPAN) 1000 MG CR tablet Take 1,000 mg by mouth at bedtime.     . Niacin CR 1000 MG TBCR Take by mouth.    . pravastatin (PRAVACHOL) 40 MG tablet Take 40 mg by mouth at bedtime.      No current facility-administered medications on file prior to visit.    There are no Patient Instructions on file for this visit. No follow-ups on file.   Kris Hartmann, NP

## 2021-04-16 ENCOUNTER — Other Ambulatory Visit (INDEPENDENT_AMBULATORY_CARE_PROVIDER_SITE_OTHER): Payer: Self-pay | Admitting: Nurse Practitioner

## 2021-04-16 DIAGNOSIS — N186 End stage renal disease: Secondary | ICD-10-CM

## 2021-04-17 ENCOUNTER — Ambulatory Visit (INDEPENDENT_AMBULATORY_CARE_PROVIDER_SITE_OTHER): Payer: Medicare Other

## 2021-04-17 ENCOUNTER — Other Ambulatory Visit: Payer: Self-pay

## 2021-04-17 ENCOUNTER — Ambulatory Visit (INDEPENDENT_AMBULATORY_CARE_PROVIDER_SITE_OTHER): Payer: Medicare Other | Admitting: Nurse Practitioner

## 2021-04-17 VITALS — BP 108/73 | HR 101 | Ht 68.0 in | Wt 171.0 lb

## 2021-04-17 DIAGNOSIS — E1122 Type 2 diabetes mellitus with diabetic chronic kidney disease: Secondary | ICD-10-CM

## 2021-04-17 DIAGNOSIS — N186 End stage renal disease: Secondary | ICD-10-CM | POA: Diagnosis not present

## 2021-04-17 DIAGNOSIS — Z992 Dependence on renal dialysis: Secondary | ICD-10-CM

## 2021-04-17 DIAGNOSIS — I10 Essential (primary) hypertension: Secondary | ICD-10-CM

## 2021-04-28 ENCOUNTER — Encounter (INDEPENDENT_AMBULATORY_CARE_PROVIDER_SITE_OTHER): Payer: Self-pay | Admitting: Nurse Practitioner

## 2021-04-28 NOTE — Progress Notes (Signed)
Subjective:    Patient ID: Kaion Tisdale, male    DOB: 1956/07/13, 65 y.o.   MRN: [id number] Chief Complaint  Patient presents with  . Follow-up    6 Mo U/S    The patient returns to the office for followup of their dialysis access. The function of the access has been stable. The patient denies increased bleeding time or increased recirculation. Patient denies difficulty with cannulation. The patient denies hand pain or other symptoms consistent with steal phenomena.  No significant arm swelling.  The patient denies redness or swelling at the access site. The patient denies fever or chills at home or while on dialysis.  The patient denies amaurosis fugax or recent TIA symptoms. There are no recent neurological changes noted. The patient denies claudication symptoms or rest pain symptoms. The patient denies history of DVT, PE or superficial thrombophlebitis. The patient denies recent episodes of angina or shortness of breath.   The patient has a flow volume of 3250.  The patient's access is torturous aneurysmal.      Review of Systems  Hematological: Does not bruise/bleed easily.  All other systems reviewed and are negative.      Objective:   Physical Exam Vitals reviewed.  HENT:     Head: Normocephalic.  Cardiovascular:     Rate and Rhythm: Normal rate.     Pulses: Normal pulses.     Arteriovenous access: left arteriovenous access is present.    Comments: Good thrill and bruit of left brachiocephalic AV fistula Pulmonary:     Effort: Pulmonary effort is normal.  Neurological:     Mental Status: He is alert and oriented to person, place, and time. Mental status is at baseline.  Psychiatric:        Mood and Affect: Mood normal.        Behavior: Behavior normal.        Thought Content: Thought content normal.        Judgment: Judgment normal.     BP 108/73   Pulse (!) 101   Ht 5\' 8"  (1.727 m)   Wt 171 lb (77.6 kg)   BMI 26.00 kg/m   Past Medical History:   Diagnosis Date  . Anemia   . Cerebral hemorrhage ([place]) 2012  . Chronic constipation   . Chronic kidney disease   . Diabetes mellitus without complication ([place])   . Hemodialysis patient ([place])   . Hyperlipidemia   . Hypertension   . Schizophrenia ([place])   . Stroke [facility])     Social History   Socioeconomic History  . Marital status: Single    Spouse name: Not on file  . Number of children: Not on file  . Years of education: Not on file  . Highest education level: Not on file  Occupational History  . Not on file  Tobacco Use  . Smoking status: Never Smoker  . Smokeless tobacco: Never Used  Substance and Sexual Activity  . Alcohol use: No  . Drug use: No  . Sexual activity: Not on file  Other Topics Concern  . Not on file  Social History Narrative  . Not on file   Social Determinants of Health   Financial Resource Strain: Not on file  Food Insecurity: Not on file  Transportation Needs: Not on file  Physical Activity: Not on file  Stress: Not on file  Social Connections: Not on file  Intimate Partner Violence: Not on file    Past Surgical History:  Procedure Laterality  Date  . A/V FISTULAGRAM Left 02/24/2017   Procedure: A/V Fistulagram;  Surgeon: Katha Cabal, MD;  Location: [place] [facility];  Service: Cardiovascular;  Laterality: Left;  . A/V FISTULAGRAM Left 09/03/2020   Procedure: A/V FISTULAGRAM;  Surgeon: Algernon Huxley, MD;  Location: [place] [facility];  Service: Cardiovascular;  Laterality: Left;  . PERIPHERAL VASCULAR CATHETERIZATION N/A 04/17/2015   Procedure: Dialysis/Perma Catheter Insertion;  Surgeon: Katha Cabal, MD;  Location: [place] [facility];  Service: Cardiovascular;  Laterality: N/A;  . PERIPHERAL VASCULAR CATHETERIZATION Left 07/30/2015   Procedure: A/V Shuntogram/Fistulagram;  Surgeon: Algernon Huxley, MD;  Location: [place] [facility];  Service: Cardiovascular;  Laterality: Left;  . PERIPHERAL VASCULAR CATHETERIZATION Left  07/30/2015   Procedure: A/V Shunt Intervention;  Surgeon: Algernon Huxley, MD;  Location: [place] [facility];  Service: Cardiovascular;  Laterality: Left;  . PERIPHERAL VASCULAR CATHETERIZATION N/A 08/20/2015   Procedure: DIALYSIS/PERMA CATHETER REMOVAL;  Surgeon: Algernon Huxley, MD;  Location: [place] [facility];  Service: Cardiovascular;  Laterality: N/A;  . VASCULAR SURGERY     Fistula placement    Family History  Problem Relation Age of Onset  . Diabetes Mother   . Kidney cancer Mother   . Diabetes Sister   . Stroke Brother   . Prostate cancer Neg Hx   . Bladder Cancer Neg Hx     Allergies  Allergen Reactions  . Nsaids Other (See Comments)    Cannot take due to renal disease    CBC Latest Ref Rng & Units 03/08/2020 05/13/2019 04/11/2019  WBC 4.0 - 10.5 K/uL 2.9(L) 3.7(L) 3.3(L)  Hemoglobin 13.0 - 17.0 g/dL 10.0(L) 10.5(L) 10.6(L)  Hematocrit 39.0 - 52.0 % 30.6(L) 32.9(L) 32.9(L)  Platelets 150 - 400 K/uL 80(L) 86(L) 78(L)      CMP     Component Value Date/Time   NA 135 03/08/2020 1756   NA 140 03/21/2015 1512   K 3.0 (L) 03/08/2020 1756   K 4.0 03/21/2015 1512   CL 93 (L) 03/08/2020 1756   CL 95 (L) 03/21/2015 1512   CO2 29 03/08/2020 1756   CO2 36 (H) 03/21/2015 1512   GLUCOSE 123 (H) 03/08/2020 1756   GLUCOSE 117 (H) 03/21/2015 1512   BUN 11 03/08/2020 1756   BUN 64 (H) 03/21/2015 1512   CREATININE 3.46 (H) 03/08/2020 1756   CREATININE 7.71 (H) 03/21/2015 1512   CALCIUM 8.7 (L) 03/08/2020 1756   CALCIUM 8.5 (L) 03/21/2015 1512   PROT 6.6 03/08/2020 1756   PROT 5.8 (L) 12/13/2014 1354   ALBUMIN 3.3 (L) 03/08/2020 1756   ALBUMIN 2.5 (L) 12/13/2014 1354   AST 41 03/08/2020 1756   AST 17 12/13/2014 1354   ALT 25 03/08/2020 1756   ALT 21 12/13/2014 1354   ALKPHOS 83 03/08/2020 1756   ALKPHOS 50 12/13/2014 1354   BILITOT 0.8 03/08/2020 1756   BILITOT 0.2 12/13/2014 1354   GFRNONAA 18 (L) 03/08/2020 1756   GFRNONAA 7 (L) 03/21/2015 1512   GFRAA 21 (L) 03/08/2020  1756   GFRAA 8 (L) 03/21/2015 1512     No results found.     Assessment & Plan:   1. ESRD (end stage renal disease) ([place]) Recommend:  The patient is doing well and currently has adequate dialysis access. The patient's dialysis center is not reporting any access issues. Flow pattern is stable when compared to the prior ultrasound.  The patient should have a duplex ultrasound of the dialysis  access in 6 months. The patient will follow-up with me in the office after each ultrasound    - VAS [place] (AVF, AVG); Future  2. Essential hypertension Continue antihypertensive medications as already ordered, these medications have been reviewed and there are no changes at this time.   3. Type 2 diabetes mellitus with chronic kidney disease on chronic dialysis, unspecified whether long term insulin use ([place]) Continue hypoglycemic medications as already ordered, these medications have been reviewed and there are no changes at this time.  Hgb A1C to be monitored as already arranged by primary service    Current Outpatient Medications on File Prior to Visit  Medication Sig Dispense Refill  . aspirin EC 81 MG tablet Take 81 mg by mouth daily.     [name] BANOPHEN 25 MG capsule Take 25 mg by mouth See admin instructions. Take 1 capsule (25mg ) by mouth nightly as needed for sleep - may take 1 capsule (25mg ) by mouth during the daytime as needed for itching    . benztropine (COGENTIN) 0.5 MG tablet Take 0.5 mg by mouth 2 (two) times daily.    . carvedilol (COREG) 25 MG tablet Take 25 mg by mouth 2 (two) times daily with a meal.     . cetirizine (ZYRTEC) 10 MG tablet Take 10 mg by mouth at bedtime.     . Cholecalciferol 125 MCG (5000 UT) TABS Take 1 capsule by mouth once a week.    . Cinacalcet HCl (SENSIPAR PO) Take by mouth.    . Coenzyme Q10 100 MG capsule Take by mouth.    . diphenhydrAMINE-Zinc Acetate (BENADRYL ITCH RELIEF STICK) 2-0.1 % STCK Apply 1 application topically in  the morning, at noon, and at bedtime.     . docusate sodium (COLACE) 100 MG capsule Take 100 mg by mouth 2 (two) times daily.    Regino Schultze TEST test strip     . fluticasone (FLONASE) 50 MCG/ACT nasal spray Place into the nose.    [name] glipiZIDE (GLUCOTROL XL) 2.5 MG 24 hr tablet Take 2.5 mg by mouth daily with breakfast.     . glucose blood (EASYMAX TEST) test strip EasyMax    . haloperidol (HALDOL) 10 MG tablet Take by mouth.    . haloperidol (HALDOL) 5 MG tablet Take 7.5 mg by mouth at bedtime.     . lidocaine-prilocaine (EMLA) cream Apply 1 application topically 3 (three) times a week. Apply over dialysis shunt 30 minutes prior to dialysis.    [name] losartan (COZAAR) 100 MG tablet Take 1 tablet (100 mg total) by mouth daily. 30 tablet 0  . midodrine (PROAMATINE) 2.5 MG tablet Take by mouth.    . mirtazapine (REMERON) 15 MG tablet Take 15 mg by mouth at bedtime.    . Multiple Vitamins-Minerals (MULTIVITAMIN WITH MINERALS) tablet Take 1 tablet by mouth daily.     . niacin (NIASPAN) 1000 MG CR tablet Take 1,000 mg by mouth at bedtime.     . Niacin CR 1000 MG TBCR Take by mouth.    . pravastatin (PRAVACHOL) 40 MG tablet Take 40 mg by mouth at bedtime.      No current facility-administered medications on file prior to visit.    There are no Patient Instructions on file for this visit. No follow-ups on file.   Kris Hartmann, NP

## 2021-06-19 ENCOUNTER — Telehealth (INDEPENDENT_AMBULATORY_CARE_PROVIDER_SITE_OTHER): Payer: Self-pay

## 2021-06-19 NOTE — Telephone Encounter (Signed)
Spoke with someone at the group home where the patient resides and the patient is scheduled with Dr. Lucky Cowboy for a left arm fistulagram on 06/27/21 with a 6:45 am arrival time to the MM. Pre-procedure instructions will be mailed.

## 2021-06-24 ENCOUNTER — Other Ambulatory Visit (INDEPENDENT_AMBULATORY_CARE_PROVIDER_SITE_OTHER): Payer: Self-pay | Admitting: Nurse Practitioner

## 2021-06-27 ENCOUNTER — Other Ambulatory Visit: Payer: Self-pay

## 2021-06-27 ENCOUNTER — Encounter
Admission: RE | Disposition: A | Discharge status: Home / Self Care | Payer: Self-pay | Source: Home / Self Care | Attending: Vascular Surgery

## 2021-06-27 ENCOUNTER — Ambulatory Visit
Admission: RE | Admit: 2021-06-27 | Discharge: 2021-06-27 | Disposition: A | Discharge status: Home / Self Care | Payer: Medicare Other | Attending: Vascular Surgery | Admitting: Vascular Surgery

## 2021-06-27 ENCOUNTER — Encounter: Payer: Self-pay | Admitting: Vascular Surgery

## 2021-06-27 DIAGNOSIS — T82510A Breakdown (mechanical) of surgically created arteriovenous fistula, initial encounter: Secondary | ICD-10-CM | POA: Insufficient documentation

## 2021-06-27 DIAGNOSIS — I120 Hypertensive chronic kidney disease with stage 5 chronic kidney disease or end stage renal disease: Secondary | ICD-10-CM | POA: Insufficient documentation

## 2021-06-27 DIAGNOSIS — Z992 Dependence on renal dialysis: Secondary | ICD-10-CM | POA: Diagnosis not present

## 2021-06-27 DIAGNOSIS — Y841 Kidney dialysis as the cause of abnormal reaction of the patient, or of later complication, without mention of misadventure at the time of the procedure: Secondary | ICD-10-CM | POA: Insufficient documentation

## 2021-06-27 DIAGNOSIS — Z886 Allergy status to analgesic agent status: Secondary | ICD-10-CM | POA: Insufficient documentation

## 2021-06-27 DIAGNOSIS — N186 End stage renal disease: Secondary | ICD-10-CM

## 2021-06-27 DIAGNOSIS — E1122 Type 2 diabetes mellitus with diabetic chronic kidney disease: Secondary | ICD-10-CM | POA: Insufficient documentation

## 2021-06-27 DIAGNOSIS — T829XXA Unspecified complication of cardiac and vascular prosthetic device, implant and graft, initial encounter: Secondary | ICD-10-CM | POA: Diagnosis not present

## 2021-06-27 HISTORY — PX: A/V FISTULAGRAM: CATH118298

## 2021-06-27 LAB — POTASSIUM (ARMC VASCULAR LAB ONLY): Potassium (ARMC vascular lab): 5.1 (ref 3.5–5.1)

## 2021-06-27 LAB — GLUCOSE, CAPILLARY
Glucose-Capillary: 93 mg/dL (ref 70–99)
Glucose-Capillary: 75 mg/dL (ref 70–99)

## 2021-06-27 SURGERY — A/V FISTULAGRAM
Anesthesia: Moderate Sedation | Laterality: Left

## 2021-06-27 MED ORDER — HEPARIN SODIUM (PORCINE) 1000 UNIT/ML IJ SOLN
INTRAMUSCULAR | Status: AC
  Filled 2021-06-27: qty 1

## 2021-06-27 MED ORDER — HEPARIN SODIUM (PORCINE) 1000 UNIT/ML IJ SOLN
INTRAMUSCULAR | Status: DC | PRN
  Administered 2021-06-27: 3000 [IU] via INTRAVENOUS

## 2021-06-27 MED ORDER — MIDAZOLAM HCL 2 MG/ML PO SYRP: 8.0000 mg | ORAL_SOLUTION | Freq: Once | ORAL | Status: DC | PRN

## 2021-06-27 MED ORDER — HYDROMORPHONE HCL 1 MG/ML IJ SOLN: 1.0000 mg | Freq: Once | INTRAMUSCULAR | Status: DC | PRN

## 2021-06-27 MED ORDER — FAMOTIDINE 20 MG PO TABS: 40.0000 mg | ORAL_TABLET | Freq: Once | ORAL | Status: DC | PRN

## 2021-06-27 MED ORDER — ONDANSETRON HCL 4 MG/2ML IJ SOLN: 4.0000 mg | Freq: Four times a day (QID) | INTRAMUSCULAR | Status: DC | PRN

## 2021-06-27 MED ORDER — MIDAZOLAM HCL 2 MG/2ML IJ SOLN
INTRAMUSCULAR | Status: DC | PRN
  Administered 2021-06-27: 2 mg via INTRAVENOUS

## 2021-06-27 MED ORDER — FENTANYL CITRATE (PF) 100 MCG/2ML IJ SOLN
INTRAMUSCULAR | Status: AC
  Filled 2021-06-27: qty 2

## 2021-06-27 MED ORDER — MIDAZOLAM HCL 2 MG/2ML IJ SOLN
INTRAMUSCULAR | Status: AC
  Filled 2021-06-27: qty 2

## 2021-06-27 MED ORDER — IODIXANOL 320 MG/ML IV SOLN
INTRAVENOUS | Status: DC | PRN
  Administered 2021-06-27: 15 mL via INTRAVENOUS

## 2021-06-27 MED ORDER — METHYLPREDNISOLONE SODIUM SUCC 125 MG IJ SOLR: 125.0000 mg | Freq: Once | INTRAMUSCULAR | Status: DC | PRN

## 2021-06-27 MED ORDER — SODIUM CHLORIDE 0.9 % IV SOLN: INTRAVENOUS | Status: DC

## 2021-06-27 MED ORDER — CEFAZOLIN SODIUM-DEXTROSE 1-4 GM/50ML-% IV SOLN
1.0000 g | Freq: Once | INTRAVENOUS | Status: AC
  Administered 2021-06-27: 1 g via INTRAVENOUS

## 2021-06-27 MED ORDER — DIPHENHYDRAMINE HCL 50 MG/ML IJ SOLN: 50.0000 mg | Freq: Once | INTRAMUSCULAR | Status: DC | PRN

## 2021-06-27 MED ORDER — FENTANYL CITRATE (PF) 100 MCG/2ML IJ SOLN
INTRAMUSCULAR | Status: DC | PRN
  Administered 2021-06-27 (×2): 50 ug via INTRAVENOUS

## 2021-06-27 SURGICAL SUPPLY — 11 items
BALLN LUTONIX AV 8X60X75 (BALLOONS) ×2
BALLOON LUTONIX AV 8X60X75 (BALLOONS) ×1 IMPLANT
CANNULA 5F STIFF (CANNULA) ×2 IMPLANT
COVER PROBE U/S 5X48 (MISCELLANEOUS) ×2 IMPLANT
DRAPE BRACHIAL (DRAPES) ×2 IMPLANT
KIT ENCORE 26 ADVANTAGE (KITS) ×2 IMPLANT
PACK ANGIOGRAPHY (CUSTOM PROCEDURE TRAY) ×2 IMPLANT
SHEATH BRITE TIP 6FRX5.5 (SHEATH) ×2 IMPLANT
SUT MNCRL AB 4-0 PS2 18 (SUTURE) ×2 IMPLANT
TOWEL OR 17X26 4PK STRL BLUE (TOWEL DISPOSABLE) ×2 IMPLANT
WIRE MAGIC TOR.035 180C (WIRE) ×2 IMPLANT

## 2021-06-27 NOTE — Op Note (Signed)
[facility]    OPERATIVE NOTE   PROCEDURE: 1.   Left brachiocephalic arteriovenous fistula cannulation under ultrasound guidance 2.   Left arm fistulagram including central venogram 3.   Percutaneous transluminal angioplasty of the cephalic vein subclavian vein confluence with 8 mm diameter by 6 cm length Lutonix drug-coated angioplasty balloon  PRE-OPERATIVE DIAGNOSIS: 1. ESRD 2. Poorly functional aneurysmal left brachiocephalic AVF  POST-OPERATIVE DIAGNOSIS: same as above   SURGEON: [name], MD  ANESTHESIA: local with MCS  ESTIMATED BLOOD LOSS: 3 cc  FINDING(S): Markedly aneurysmal and somewhat tortuous left brachiocephalic AV fistula without focal stenosis in the arm portion.  At the cephalic vein subclavian vein confluence the vein size decreased and had the appearance of a 55 to 60% stenosis at this location.  The central venous circulation was then widely patent beyond this moderate narrowing.  SPECIMEN(S):  None  CONTRAST: 15 cc  FLUORO TIME: 0.9 minutes  MODERATE CONSCIOUS SEDATION TIME: Approximately 24 minutes with 2 mg of Versed and 100 mcg of Fentanyl   INDICATIONS: Lawrence [name] is a 65 y.o. male who presents with malfunctioning and markedly aneurysmal left brachiocephalic arteriovenous fistula.  The patient is scheduled for left arm fistulagram.  The patient is aware the risks include but are not limited to: bleeding, infection, thrombosis of the cannulated access, and possible anaphylactic reaction to the contrast.  The patient is aware of the risks of the procedure and elects to proceed forward.  DESCRIPTION: After full informed written consent was obtained, the patient was brought back to the angiography suite and placed supine upon the angiography table.  The patient was connected to monitoring equipment. Moderate conscious sedation was administered with a face to face encounter with the patient throughout the procedure with my  supervision of the RN administering medicines and monitoring the patient's vital signs and mental status throughout from the start of the procedure until the patient was taken to the recovery room. The left arm was prepped and draped in the standard fashion for a percutaneous access intervention.  Under ultrasound guidance, the left brachiocephalic arteriovenous fistula was cannulated with a micropuncture needle under direct ultrasound guidance where it was patent and a permanent image was performed.  The microwire was advanced into the fistula and the needle was exchanged for the a microsheath.  I then upsized to a 6 Fr Sheath and imaging was performed.  Hand injections were completed to image the access including the central venous system. This demonstrated a markedly aneurysmal and somewhat tortuous left brachiocephalic AV fistula without focal stenosis in the arm portion.  At the cephalic vein subclavian vein confluence the vein size decreased and had the appearance of a 55 to 60% stenosis at this location.  The central venous circulation was then widely patent beyond this moderate narrowing.  Based on the images, this patient will need intervention to the cephalic vein subclavian vein confluence. I then gave the patient 3000 units of intravenous heparin.  I then crossed the stenosis with a Magic Tourqe wire.  Based on the imaging, a 8 mm x 6 cm Lutonix drug-coated angioplasty balloon was selected.  The balloon was centered around the cephalic vein subclavian vein confluence stenosis and inflated to 8 ATM for 1 minute(s).  On completion imaging, a 25-30% residual stenosis was present.     Based on the completion imaging, no further intervention is necessary.  The wire and balloon were removed from the sheath.  A 4-0 Monocryl purse-string suture was  sewn around the sheath.  The sheath was removed while tying down the suture.  A sterile bandage was applied to the puncture site.  COMPLICATIONS:  None  CONDITION: Stable   [name]  06/27/2021 9:03 AM   This note was created with Dragon Medical transcription system. Any errors in dictation are purely unintentional.

## 2021-06-27 NOTE — H&P (Signed)
[facility] Admission History & Physical  MRN : [id number]  Lawrence [name] is a 65 y.o. (05/28/1956) male who presents with chief complaint of No chief complaint on file. [name]  History of Present Illness: I am asked to evaluate the patient by the dialysis center. The patient was sent here because they were unable to achieve adequate dialysis this morning. Furthermore the Center states there is very poor thrill and bruit. The patient states there there have been increasing problems with the access, such as "pulling clots" during dialysis and prolonged bleeding after decannulation. The patient estimates these problems have been going on for several weeks. The patient is unaware of any other change.   Patient denies pain or tenderness overlying the access.  There is no pain with dialysis.  The patient denies hand pain or finger pain consistent with steal syndrome.    There have been past interventions of this access, but the last one was almost a year ago.  The patient is not chronically hypotensive on dialysis.  Current Facility-Administered Medications  Medication Dose Route Frequency Provider Last Rate Last Admin   0.9 %  sodium chloride infusion   Intravenous Continuous Kris Hartmann, NP 10 mL/hr at 06/27/21 0748 New Bag at 06/27/21 0748   ceFAZolin (ANCEF) IVPB 1 g/50 mL premix  1 g Intravenous Once Kris Hartmann, NP       diphenhydrAMINE (BENADRYL) injection 50 mg  50 mg Intravenous Once PRN Kris Hartmann, NP       famotidine (PEPCID) tablet 40 mg  40 mg Oral Once PRN Kris Hartmann, NP       fentaNYL (SUBLIMAZE) 100 MCG/2ML injection            heparin sodium (porcine) 1000 UNIT/ML injection            HYDROmorphone (DILAUDID) injection 1 mg  1 mg Intravenous Once PRN Kris Hartmann, NP       methylPREDNISolone sodium succinate (SOLU-MEDROL) 125 mg/2 mL injection 125 mg  125 mg Intravenous Once PRN Kris Hartmann, NP       midazolam (VERSED) 2 MG/2ML  injection            midazolam (VERSED) 2 MG/ML syrup 8 mg  8 mg Oral Once PRN Kris Hartmann, NP       ondansetron [facility]) injection 4 mg  4 mg Intravenous Q6H PRN Kris Hartmann, NP        Past Medical History:  Diagnosis Date   Anemia    Cerebral hemorrhage ([place]) 2012   Chronic constipation    Chronic kidney disease    Diabetes mellitus without complication ([place])    Hemodialysis patient ([place])    Hyperlipidemia    Hypertension    Schizophrenia ([place])    Stroke [facility])     Past Surgical History:  Procedure Laterality Date   A/V FISTULAGRAM Left 02/24/2017   Procedure: A/V Fistulagram;  Surgeon: Katha Cabal, MD;  Location: [place] [facility];  Service: Cardiovascular;  Laterality: Left;   A/V FISTULAGRAM Left 09/03/2020   Procedure: A/V FISTULAGRAM;  Surgeon: Algernon Huxley, MD;  Location: [place] [facility];  Service: Cardiovascular;  Laterality: Left;   PERIPHERAL VASCULAR CATHETERIZATION N/A 04/17/2015   Procedure: Dialysis/Perma Catheter Insertion;  Surgeon: Katha Cabal, MD;  Location: [place] [facility];  Service: Cardiovascular;  Laterality: N/A;   PERIPHERAL VASCULAR CATHETERIZATION Left 07/30/2015   Procedure: A/V Shuntogram/Fistulagram;  Surgeon: Erskine Squibb  Lucky Cowboy, MD;  Location: [place] [facility];  Service: Cardiovascular;  Laterality: Left;   PERIPHERAL VASCULAR CATHETERIZATION Left 07/30/2015   Procedure: A/V Shunt Intervention;  Surgeon: Algernon Huxley, MD;  Location: [place] [facility];  Service: Cardiovascular;  Laterality: Left;   PERIPHERAL VASCULAR CATHETERIZATION N/A 08/20/2015   Procedure: DIALYSIS/PERMA CATHETER REMOVAL;  Surgeon: Algernon Huxley, MD;  Location: [place] [facility];  Service: Cardiovascular;  Laterality: N/A;   VASCULAR SURGERY     Fistula placement     Social History   Tobacco Use   Smoking status: Never   Smokeless tobacco: Never  Substance Use Topics   Alcohol use: No   Drug use: No    Family History  Problem Relation Age of  Onset   Diabetes Mother    Kidney cancer Mother    Diabetes Sister    Stroke Brother    Prostate cancer Neg Hx    Bladder Cancer Neg Hx     No family history of bleeding or clotting disorders, autoimmune disease or porphyria  Allergies  Allergen Reactions   Nsaids Other (See Comments)    Cannot take due to renal disease     REVIEW OF SYSTEMS (Negative unless checked)  Constitutional: [] Weight loss  [] Fever  [] Chills Cardiac: [] Chest pain   [] Chest pressure   [] Palpitations   [] Shortness of breath when laying flat   [] Shortness of breath at rest   [x] Shortness of breath with exertion. Vascular:  [] Pain in legs with walking   [] Pain in legs at rest   [] Pain in legs when laying flat   [] Claudication   [] Pain in feet when walking  [] Pain in feet at rest  [] Pain in feet when laying flat   [] History of DVT   [] Phlebitis   [] Swelling in legs   [] Varicose veins   [] Non-healing ulcers Pulmonary:   [] Uses home oxygen   [] Productive cough   [] Hemoptysis   [] Wheeze  [] COPD   [] Asthma Neurologic:  [] Dizziness  [] Blackouts   [] Seizures   [x] History of stroke   [] History of TIA  [] Aphasia   [] Temporary blindness   [] Dysphagia   [] Weakness or numbness in arms   [] Weakness or numbness in legs X positive for schizophrenia Musculoskeletal:  [] Arthritis   [] Joint swelling   [] Joint pain   [] Low back pain Hematologic:  [] Easy bruising  [] Easy bleeding   [] Hypercoagulable state   [x] Anemic  [] Hepatitis Gastrointestinal:  [] Blood in stool   [] Vomiting blood  [] Gastroesophageal reflux/heartburn   [] Difficulty swallowing. Genitourinary:  [x] Chronic kidney disease   [] Difficult urination  [] Frequent urination  [] Burning with urination   [] Blood in urine Skin:  [] Rashes   [] Ulcers   [] Wounds Psychological:  [] History of anxiety   []  History of major depression.  Physical Examination  Vitals:   06/27/21 0732  BP: 123/83  Pulse: 88  Resp: 18  Temp: 98 F (36.7 C)  TempSrc: Oral  SpO2: 99%  Weight: 72.6  kg  Height: 5\' 7"  (1.702 m)   Body mass index is 25.06 kg/m. Gen: WD/WN, NAD Head: [place]/AT, No temporalis wasting.  Ear/Nose/Throat: Hearing grossly intact, nares w/o erythema or drainage, oropharynx w/o Erythema/Exudate,  Eyes: Conjunctiva clear, sclera non-icteric Neck: Trachea midline.  No JVD.  Pulmonary:  Good air movement, respirations not labored, no use of accessory muscles.  Cardiac: RRR, normal S1, S2. Vascular: Aneurysmal and somewhat pulsatile left upper arm AV fistula Vessel Right Left  Radial Palpable Palpable   Musculoskeletal: M/S 5/5 throughout.  Extremities without ischemic changes.  No deformity or atrophy.  Neurologic: Sensation grossly intact in extremities.  Symmetrical.  Speech is fluent. Motor exam as listed above. Psychiatric: Judgment intact, Mood & affect appropriate for pt's clinical situation. Dermatologic: No rashes or ulcers noted.  No cellulitis or open wounds.    CBC Lab Results  Component Value Date   WBC 2.9 (L) 03/08/2020   HGB 10.0 (L) 03/08/2020   HCT 30.6 (L) 03/08/2020   MCV 100.7 (H) 03/08/2020   PLT 80 (L) 03/08/2020    BMET    Component Value Date/Time   NA 135 03/08/2020 1756   NA 140 03/21/2015 1512   K 3.0 (L) 03/08/2020 1756   K 4.0 03/21/2015 1512   CL 93 (L) 03/08/2020 1756   CL 95 (L) 03/21/2015 1512   CO2 29 03/08/2020 1756   CO2 36 (H) 03/21/2015 1512   GLUCOSE 123 (H) 03/08/2020 1756   GLUCOSE 117 (H) 03/21/2015 1512   BUN 11 03/08/2020 1756   BUN 64 (H) 03/21/2015 1512   CREATININE 3.46 (H) 03/08/2020 1756   CREATININE 7.71 (H) 03/21/2015 1512   CALCIUM 8.7 (L) 03/08/2020 1756   CALCIUM 8.5 (L) 03/21/2015 1512   GFRNONAA 18 (L) 03/08/2020 1756   GFRNONAA 7 (L) 03/21/2015 1512   GFRAA 21 (L) 03/08/2020 1756   GFRAA 8 (L) 03/21/2015 1512   CrCl cannot be calculated (Patient's most recent lab result is older than the maximum 21 days allowed.).  COAG Lab Results  Component Value Date   INR 1.1 12/13/2014     Radiology No results found.  Assessment/Plan 1.  Complication dialysis device with dysfunction of AV access:  Patient's dialysis access is malfunctioning. The patient will undergo angiography and correction of any problems using interventional techniques with the hope of restoring function to the access.  The risks and benefits were described to the patient.  All questions were answered.  The patient agrees to proceed with angiography and intervention. Potassium will be drawn to ensure that it is an appropriate level prior to performing intervention. 2.  End-stage renal disease requiring hemodialysis:  Patient will continue dialysis therapy without further interruption if a successful intervention is not achieved then a tunneled catheter will be placed. Dialysis has already been arranged. 3.  Hypertension:  Patient will continue medical management; nephrology is following no changes in oral medications. 4. Diabetes mellitus:  Glucose will be monitored and oral medications been held this morning once the patient has undergone the patient's procedure po intake will be reinitiated and again Accu-Cheks will be used to assess the blood glucose level and treat as needed. The patient will be restarted on the patient's usual hypoglycemic regime    [name], MD  06/27/2021 8:10 AM

## 2021-06-28 ENCOUNTER — Encounter: Payer: Self-pay | Admitting: Vascular Surgery

## 2021-07-08 ENCOUNTER — Other Ambulatory Visit (INDEPENDENT_AMBULATORY_CARE_PROVIDER_SITE_OTHER): Payer: Self-pay | Admitting: Nurse Practitioner

## 2021-07-08 DIAGNOSIS — T829XXS Unspecified complication of cardiac and vascular prosthetic device, implant and graft, sequela: Secondary | ICD-10-CM

## 2021-07-08 DIAGNOSIS — N186 End stage renal disease: Secondary | ICD-10-CM

## 2021-07-10 ENCOUNTER — Encounter (INDEPENDENT_AMBULATORY_CARE_PROVIDER_SITE_OTHER): Payer: Self-pay | Admitting: Nurse Practitioner

## 2021-07-10 ENCOUNTER — Other Ambulatory Visit: Payer: Self-pay

## 2021-07-10 ENCOUNTER — Ambulatory Visit (INDEPENDENT_AMBULATORY_CARE_PROVIDER_SITE_OTHER): Payer: Medicare Other | Admitting: Nurse Practitioner

## 2021-07-10 ENCOUNTER — Ambulatory Visit (INDEPENDENT_AMBULATORY_CARE_PROVIDER_SITE_OTHER): Payer: Medicare Other

## 2021-07-10 VITALS — BP 99/67 | HR 93 | Ht 67.0 in | Wt 152.0 lb

## 2021-07-10 DIAGNOSIS — I10 Essential (primary) hypertension: Secondary | ICD-10-CM

## 2021-07-10 DIAGNOSIS — T829XXS Unspecified complication of cardiac and vascular prosthetic device, implant and graft, sequela: Secondary | ICD-10-CM

## 2021-07-10 DIAGNOSIS — N186 End stage renal disease: Secondary | ICD-10-CM

## 2021-07-10 DIAGNOSIS — E1122 Type 2 diabetes mellitus with diabetic chronic kidney disease: Secondary | ICD-10-CM | POA: Diagnosis not present

## 2021-07-10 DIAGNOSIS — Z992 Dependence on renal dialysis: Secondary | ICD-10-CM

## 2021-07-10 NOTE — H&P (View-Only) (Signed)
Subjective:    Patient ID: Lawrence [name], male    DOB: [birth year], 65 y.o.   MRN: [id number] Chief Complaint  Patient presents with   Follow-up    Last seen 5/11/222 HDA and consult seen JD or FD and consul t referred by Koll[place]     Lawrence [name] is a 65 year old male that returns to the office for followup of their dialysis access. The function of the access has been stable. The patient denies increased bleeding time or increased recirculation. Patient denies difficulty with cannulation. The patient denies hand pain or other symptoms consistent with steal phenomena.  No significant arm swelling.  The patient most recently had angioplasty of his confluence on 06/27/2021.  The patient denies redness or swelling at the access site. The patient denies fever or chills at home or while on dialysis.  The patient denies amaurosis fugax or recent TIA symptoms. There are no recent neurological changes noted. The patient denies claudication symptoms or rest pain symptoms. The patient denies history of DVT, PE or superficial thrombophlebitis. The patient denies recent episodes of angina or shortness of breath.    Today the patient has a flow volume of 1695.  The brachiocephalic AV fistula is moderately tortuous with multiple aneurysmal sites.  No area of significant stenosis noted      Review of Systems  Hematological:  Does not bruise/bleed easily.  All other systems reviewed and are negative.     Objective:   Physical Exam Vitals reviewed.  HENT:     Head: Normocephalic.  Cardiovascular:     Rate and Rhythm: Normal rate.     Pulses: Normal pulses.          Radial pulses are 2+ on the left side.     Arteriovenous access: Left arteriovenous access is present.    Comments: Good thrill and bruit Pulmonary:     Effort: Pulmonary effort is normal.  Neurological:     Mental Status: He is alert.  Psychiatric:        Mood and Affect: Mood normal.        Behavior: Behavior normal.         Thought Content: Thought content normal.        Judgment: Judgment normal.    BP 99/67   Pulse 93   Ht 5\' 7"  (1.702 m)   Wt 152 lb (68.9 kg)   BMI 23.81 kg/m   Past Medical History:  Diagnosis Date   Anemia    Cerebral hemorrhage ([employer]) 2012   Chronic constipation    Chronic kidney disease    Diabetes mellitus without complication (HCC)    Hemodialysis patient ([place])    Hyperlipidemia    Hypertension    Schizophrenia ([place])    Stroke [facility])     Social History   Socioeconomic History   Marital status: Single    Spouse name: Not on file   Number of children: Not on file   Years of education: Not on file   Highest education level: Not on file  Occupational History   Not on file  Tobacco Use   Smoking status: Never   Smokeless tobacco: Never  Substance and Sexual Activity   Alcohol use: No   Drug use: No   Sexual activity: Not on file  Other Topics Concern   Not on file  Social History Narrative   Not on file   Social Determinants of Health   Financial Resource Strain: Not on file  Food Insecurity: Not  on file  Transportation Needs: Not on file  Physical Activity: Not on file  Stress: Not on file  Social Connections: Not on file  Intimate Partner Violence: Not on file    Past Surgical History:  Procedure Laterality Date   A/V FISTULAGRAM Left 02/24/2017   Procedure: A/V Fistulagram;  Surgeon: Katha Cabal, MD;  Location: [place] [facility];  Service: Cardiovascular;  Laterality: Left;   A/V FISTULAGRAM Left 09/03/2020   Procedure: A/V FISTULAGRAM;  Surgeon: Algernon Huxley, MD;  Location: [place] [facility];  Service: Cardiovascular;  Laterality: Left;   A/V FISTULAGRAM Left 06/27/2021   Procedure: A/V FISTULAGRAM;  Surgeon: Algernon Huxley, MD;  Location: [place] [facility];  Service: Cardiovascular;  Laterality: Left;   PERIPHERAL VASCULAR CATHETERIZATION N/A 04/17/2015   Procedure: Dialysis/Perma Catheter Insertion;  Surgeon: Katha Cabal, MD;   Location: [place] [facility];  Service: Cardiovascular;  Laterality: N/A;   PERIPHERAL VASCULAR CATHETERIZATION Left 07/30/2015   Procedure: A/V Shuntogram/Fistulagram;  Surgeon: Algernon Huxley, MD;  Location: [place] [facility];  Service: Cardiovascular;  Laterality: Left;   PERIPHERAL VASCULAR CATHETERIZATION Left 07/30/2015   Procedure: A/V Shunt Intervention;  Surgeon: Algernon Huxley, MD;  Location: [place] [facility];  Service: Cardiovascular;  Laterality: Left;   PERIPHERAL VASCULAR CATHETERIZATION N/A 08/20/2015   Procedure: DIALYSIS/PERMA CATHETER REMOVAL;  Surgeon: Algernon Huxley, MD;  Location: [place] [facility];  Service: Cardiovascular;  Laterality: N/A;   VASCULAR SURGERY     Fistula placement    Family History  Problem Relation Age of Onset   Diabetes Mother    Kidney cancer Mother    Diabetes Sister    Stroke Brother    Prostate cancer Neg Hx    Bladder Cancer Neg Hx     Allergies  Allergen Reactions   Nsaids Other (See Comments)    Cannot take due to renal disease    CBC Latest Ref Rng & Units 03/08/2020 05/13/2019 04/11/2019  WBC 4.0 - 10.5 K/uL 2.9(L) 3.7(L) 3.3(L)  Hemoglobin 13.0 - 17.0 g/dL 10.0(L) 10.5(L) 10.6(L)  Hematocrit 39.0 - 52.0 % 30.6(L) 32.9(L) 32.9(L)  Platelets 150 - 400 K/uL 80(L) 86(L) 78(L)      CMP     Component Value Date/Time   NA 135 03/08/2020 1756   NA 140 03/21/2015 1512   K 3.0 (L) 03/08/2020 1756   K 4.0 03/21/2015 1512   CL 93 (L) 03/08/2020 1756   CL 95 (L) 03/21/2015 1512   CO2 29 03/08/2020 1756   CO2 36 (H) 03/21/2015 1512   GLUCOSE 123 (H) 03/08/2020 1756   GLUCOSE 117 (H) 03/21/2015 1512   BUN 11 03/08/2020 1756   BUN 64 (H) 03/21/2015 1512   CREATININE 3.46 (H) 03/08/2020 1756   CREATININE 7.71 (H) 03/21/2015 1512   CALCIUM 8.7 (L) 03/08/2020 1756   CALCIUM 8.5 (L) 03/21/2015 1512   PROT 6.6 03/08/2020 1756   PROT 5.8 (L) 12/13/2014 1354   ALBUMIN 3.3 (L) 03/08/2020 1756   ALBUMIN 2.5 (L) 12/13/2014 1354   AST 41  03/08/2020 1756   AST 17 12/13/2014 1354   ALT 25 03/08/2020 1756   ALT 21 12/13/2014 1354   ALKPHOS 83 03/08/2020 1756   ALKPHOS 50 12/13/2014 1354   BILITOT 0.8 03/08/2020 1756   BILITOT 0.2 12/13/2014 1354   GFRNONAA 18 (L) 03/08/2020 1756   GFRNONAA 7 (L) 03/21/2015 1512   GFRAA 21 (L) 03/08/2020 1756   GFRAA 8 (  L) 03/21/2015 1512     No results found.     Assessment & Plan:   1. ESRD (end stage renal disease) ([place]) Recommend:  The patient is doing well and currently has adequate dialysis access. The patient's dialysis center referral stays poor circulation but the patient notes he is not been having any issues. Flow pattern is stable when compared to the prior ultrasound.  The patient should have a duplex ultrasound of the dialysis access in 3 months to maintain close follow-up. The patient will follow-up with me in the office after each ultrasound     2. Essential hypertension Continue antihypertensive medications as already ordered, these medications have been reviewed and there are no changes at this time.   3. Type 2 diabetes mellitus with chronic kidney disease on chronic dialysis, unspecified whether long term insulin use ([place]) Continue hypoglycemic medications as already ordered, these medications have been reviewed and there are no changes at this time.  Hgb A1C to be monitored as already arranged by primary service    Current Outpatient Medications on File Prior to Visit  Medication Sig Dispense Refill   aspirin EC 81 MG tablet Take 81 mg by mouth daily.      BANOPHEN 25 MG capsule Take 25 mg by mouth See admin instructions. Take 1 capsule (25mg ) by mouth nightly as needed for sleep - may take 1 capsule (25mg ) by mouth during the daytime as needed for itching     benztropine (COGENTIN) 0.5 MG tablet Take 0.5 mg by mouth 2 (two) times daily.     carvedilol (COREG) 25 MG tablet Take 25 mg by mouth 2 (two) times daily with a meal.      cetirizine (ZYRTEC) 10  MG tablet Take 10 mg by mouth at bedtime.      Cholecalciferol 125 MCG (5000 UT) TABS Take 1 capsule by mouth once a week.     Cinacalcet HCl (SENSIPAR PO) Take by mouth.     Coenzyme Q10 100 MG capsule Take by mouth.     diphenhydrAMINE-Zinc Acetate (BENADRYL ITCH RELIEF STICK) 2-0.1 % STCK Apply 1 application topically in the morning, at noon, and at bedtime.      docusate sodium (COLACE) 100 MG capsule Take 100 mg by mouth 2 (two) times daily.     EASYMAX TEST test strip      fluticasone (FLONASE) 50 MCG/ACT nasal spray Place into the nose.     glipiZIDE (GLUCOTROL XL) 2.5 MG 24 hr tablet Take 2.5 mg by mouth daily with breakfast.      glucose blood (EASYMAX TEST) test strip EasyMax     haloperidol (HALDOL) 10 MG tablet Take by mouth.     haloperidol (HALDOL) 5 MG tablet Take 7.5 mg by mouth at bedtime.      lidocaine-prilocaine (EMLA) cream Apply 1 application topically 3 (three) times a week. Apply over dialysis shunt 30 minutes prior to dialysis.     losartan (COZAAR) 100 MG tablet Take 1 tablet (100 mg total) by mouth daily. 30 tablet 0   midodrine (PROAMATINE) 2.5 MG tablet Take 5 mg by mouth.     mirtazapine (REMERON) 15 MG tablet Take 15 mg by mouth at bedtime.     Multiple Vitamins-Minerals (MULTIVITAMIN WITH MINERALS) tablet Take 1 tablet by mouth daily.      niacin (NIASPAN) 1000 MG CR tablet Take 1,000 mg by mouth at bedtime.      Niacin CR 1000 MG TBCR Take by mouth.  pravastatin (PRAVACHOL) 40 MG tablet Take 40 mg by mouth at bedtime.      No current facility-administered medications on file prior to visit.    There are no Patient Instructions on file for this visit. No follow-ups on file.   Kris Hartmann, NP

## 2021-07-10 NOTE — Progress Notes (Signed)
Subjective:    Patient ID: Lawrence [name], male    DOB: 1956-12-06, 65 y.o.   MRN: [id number] Chief Complaint  Patient presents with   Follow-up    Last seen 5/11/222 HDA and consult seen JD or FD and consul t referred by Kolluru     Gabryel Files is a 65 year old male that returns to the office for followup of their dialysis access. The function of the access has been stable. The patient denies increased bleeding time or increased recirculation. Patient denies difficulty with cannulation. The patient denies hand pain or other symptoms consistent with steal phenomena.  No significant arm swelling.  The patient most recently had angioplasty of his confluence on 06/27/2021.  The patient denies redness or swelling at the access site. The patient denies fever or chills at home or while on dialysis.  The patient denies amaurosis fugax or recent TIA symptoms. There are no recent neurological changes noted. The patient denies claudication symptoms or rest pain symptoms. The patient denies history of DVT, PE or superficial thrombophlebitis. The patient denies recent episodes of angina or shortness of breath.    Today the patient has a flow volume of 1695.  The brachiocephalic AV fistula is moderately tortuous with multiple aneurysmal sites.  No area of significant stenosis noted      Review of Systems  Hematological:  Does not bruise/bleed easily.  All other systems reviewed and are negative.     Objective:   Physical Exam Vitals reviewed.  HENT:     Head: Normocephalic.  Cardiovascular:     Rate and Rhythm: Normal rate.     Pulses: Normal pulses.          Radial pulses are 2+ on the left side.     Arteriovenous access: Left arteriovenous access is present.    Comments: Good thrill and bruit Pulmonary:     Effort: Pulmonary effort is normal.  Neurological:     Mental Status: He is alert.  Psychiatric:        Mood and Affect: Mood normal.        Behavior: Behavior normal.         Thought Content: Thought content normal.        Judgment: Judgment normal.    BP 99/67   Pulse 93   Ht 5\' 7"  (1.702 m)   Wt 152 lb (68.9 kg)   BMI 23.81 kg/m   Past Medical History:  Diagnosis Date   Anemia    Cerebral hemorrhage ([place]) 2012   Chronic constipation    Chronic kidney disease    Diabetes mellitus without complication (HCC)    Hemodialysis patient ([place])    Hyperlipidemia    Hypertension    Schizophrenia ([place])    Stroke [facility])     Social History   Socioeconomic History   Marital status: Single    Spouse name: Not on file   Number of children: Not on file   Years of education: Not on file   Highest education level: Not on file  Occupational History   Not on file  Tobacco Use   Smoking status: Never   Smokeless tobacco: Never  Substance and Sexual Activity   Alcohol use: No   Drug use: No   Sexual activity: Not on file  Other Topics Concern   Not on file  Social History Narrative   Not on file   Social Determinants of Health   Financial Resource Strain: Not on file  Food Insecurity: Not  on file  Transportation Needs: Not on file  Physical Activity: Not on file  Stress: Not on file  Social Connections: Not on file  Intimate Partner Violence: Not on file    Past Surgical History:  Procedure Laterality Date   A/V FISTULAGRAM Left 02/24/2017   Procedure: A/V Fistulagram;  Surgeon: Katha Cabal, MD;  Location: [place] [facility];  Service: Cardiovascular;  Laterality: Left;   A/V FISTULAGRAM Left 09/03/2020   Procedure: A/V FISTULAGRAM;  Surgeon: Algernon Huxley, MD;  Location: [place] [facility];  Service: Cardiovascular;  Laterality: Left;   A/V FISTULAGRAM Left 06/27/2021   Procedure: A/V FISTULAGRAM;  Surgeon: Algernon Huxley, MD;  Location: [place] [facility];  Service: Cardiovascular;  Laterality: Left;   PERIPHERAL VASCULAR CATHETERIZATION N/A 04/17/2015   Procedure: Dialysis/Perma Catheter Insertion;  Surgeon: Katha Cabal, MD;   Location: [place] [facility];  Service: Cardiovascular;  Laterality: N/A;   PERIPHERAL VASCULAR CATHETERIZATION Left 07/30/2015   Procedure: A/V Shuntogram/Fistulagram;  Surgeon: Algernon Huxley, MD;  Location: [place] [facility];  Service: Cardiovascular;  Laterality: Left;   PERIPHERAL VASCULAR CATHETERIZATION Left 07/30/2015   Procedure: A/V Shunt Intervention;  Surgeon: Algernon Huxley, MD;  Location: [place] [facility];  Service: Cardiovascular;  Laterality: Left;   PERIPHERAL VASCULAR CATHETERIZATION N/A 08/20/2015   Procedure: DIALYSIS/PERMA CATHETER REMOVAL;  Surgeon: Algernon Huxley, MD;  Location: [place] [facility];  Service: Cardiovascular;  Laterality: N/A;   VASCULAR SURGERY     Fistula placement    Family History  Problem Relation Age of Onset   Diabetes Mother    Kidney cancer Mother    Diabetes Sister    Stroke Brother    Prostate cancer Neg Hx    Bladder Cancer Neg Hx     Allergies  Allergen Reactions   Nsaids Other (See Comments)    Cannot take due to renal disease    CBC Latest Ref Rng & Units 03/08/2020 05/13/2019 04/11/2019  WBC 4.0 - 10.5 K/uL 2.9(L) 3.7(L) 3.3(L)  Hemoglobin 13.0 - 17.0 g/dL 10.0(L) 10.5(L) 10.6(L)  Hematocrit 39.0 - 52.0 % 30.6(L) 32.9(L) 32.9(L)  Platelets 150 - 400 K/uL 80(L) 86(L) 78(L)      CMP     Component Value Date/Time   NA 135 03/08/2020 1756   NA 140 03/21/2015 1512   K 3.0 (L) 03/08/2020 1756   K 4.0 03/21/2015 1512   CL 93 (L) 03/08/2020 1756   CL 95 (L) 03/21/2015 1512   CO2 29 03/08/2020 1756   CO2 36 (H) 03/21/2015 1512   GLUCOSE 123 (H) 03/08/2020 1756   GLUCOSE 117 (H) 03/21/2015 1512   BUN 11 03/08/2020 1756   BUN 64 (H) 03/21/2015 1512   CREATININE 3.46 (H) 03/08/2020 1756   CREATININE 7.71 (H) 03/21/2015 1512   CALCIUM 8.7 (L) 03/08/2020 1756   CALCIUM 8.5 (L) 03/21/2015 1512   PROT 6.6 03/08/2020 1756   PROT 5.8 (L) 12/13/2014 1354   ALBUMIN 3.3 (L) 03/08/2020 1756   ALBUMIN 2.5 (L) 12/13/2014 1354   AST 41  03/08/2020 1756   AST 17 12/13/2014 1354   ALT 25 03/08/2020 1756   ALT 21 12/13/2014 1354   ALKPHOS 83 03/08/2020 1756   ALKPHOS 50 12/13/2014 1354   BILITOT 0.8 03/08/2020 1756   BILITOT 0.2 12/13/2014 1354   GFRNONAA 18 (L) 03/08/2020 1756   GFRNONAA 7 (L) 03/21/2015 1512   GFRAA 21 (L) 03/08/2020 1756   GFRAA 8 (  L) 03/21/2015 1512     No results found.     Assessment & Plan:   1. ESRD (end stage renal disease) ([place]) Recommend:  The patient is doing well and currently has adequate dialysis access. The patient's dialysis center referral stays poor circulation but the patient notes he is not been having any issues. Flow pattern is stable when compared to the prior ultrasound.  The patient should have a duplex ultrasound of the dialysis access in 3 months to maintain close follow-up. The patient will follow-up with me in the office after each ultrasound     2. Essential hypertension Continue antihypertensive medications as already ordered, these medications have been reviewed and there are no changes at this time.   3. Type 2 diabetes mellitus with chronic kidney disease on chronic dialysis, unspecified whether long term insulin use ([place]) Continue hypoglycemic medications as already ordered, these medications have been reviewed and there are no changes at this time.  Hgb A1C to be monitored as already arranged by primary service    Current Outpatient Medications on File Prior to Visit  Medication Sig Dispense Refill   aspirin EC 81 MG tablet Take 81 mg by mouth daily.      BANOPHEN 25 MG capsule Take 25 mg by mouth See admin instructions. Take 1 capsule (25mg ) by mouth nightly as needed for sleep - may take 1 capsule (25mg ) by mouth during the daytime as needed for itching     benztropine (COGENTIN) 0.5 MG tablet Take 0.5 mg by mouth 2 (two) times daily.     carvedilol (COREG) 25 MG tablet Take 25 mg by mouth 2 (two) times daily with a meal.      cetirizine (ZYRTEC) 10  MG tablet Take 10 mg by mouth at bedtime.      Cholecalciferol 125 MCG (5000 UT) TABS Take 1 capsule by mouth once a week.     Cinacalcet HCl (SENSIPAR PO) Take by mouth.     Coenzyme Q10 100 MG capsule Take by mouth.     diphenhydrAMINE-Zinc Acetate (BENADRYL ITCH RELIEF STICK) 2-0.1 % STCK Apply 1 application topically in the morning, at noon, and at bedtime.      docusate sodium (COLACE) 100 MG capsule Take 100 mg by mouth 2 (two) times daily.     EASYMAX TEST test strip      fluticasone (FLONASE) 50 MCG/ACT nasal spray Place into the nose.     glipiZIDE (GLUCOTROL XL) 2.5 MG 24 hr tablet Take 2.5 mg by mouth daily with breakfast.      glucose blood (EASYMAX TEST) test strip EasyMax     haloperidol (HALDOL) 10 MG tablet Take by mouth.     haloperidol (HALDOL) 5 MG tablet Take 7.5 mg by mouth at bedtime.      lidocaine-prilocaine (EMLA) cream Apply 1 application topically 3 (three) times a week. Apply over dialysis shunt 30 minutes prior to dialysis.     losartan (COZAAR) 100 MG tablet Take 1 tablet (100 mg total) by mouth daily. 30 tablet 0   midodrine (PROAMATINE) 2.5 MG tablet Take 5 mg by mouth.     mirtazapine (REMERON) 15 MG tablet Take 15 mg by mouth at bedtime.     Multiple Vitamins-Minerals (MULTIVITAMIN WITH MINERALS) tablet Take 1 tablet by mouth daily.      niacin (NIASPAN) 1000 MG CR tablet Take 1,000 mg by mouth at bedtime.      Niacin CR 1000 MG TBCR Take by mouth.  pravastatin (PRAVACHOL) 40 MG tablet Take 40 mg by mouth at bedtime.      No current facility-administered medications on file prior to visit.    There are no Patient Instructions on file for this visit. No follow-ups on file.   Kris Hartmann, NP

## 2021-07-23 ENCOUNTER — Other Ambulatory Visit (INDEPENDENT_AMBULATORY_CARE_PROVIDER_SITE_OTHER): Payer: Self-pay | Admitting: Nurse Practitioner

## 2021-07-23 ENCOUNTER — Telehealth (INDEPENDENT_AMBULATORY_CARE_PROVIDER_SITE_OTHER): Payer: Self-pay | Admitting: Vascular Surgery

## 2021-07-23 NOTE — Telephone Encounter (Signed)
Schedule for 8/17

## 2021-07-23 NOTE — Telephone Encounter (Signed)
Called stating that patient needs a possible d-clot.  Facility cannot send over fax because their fax machine is still down.  I gave Sherlynn Carbon our nurse April's email to her send order.  This note is for documentation purposes only.

## 2021-07-24 ENCOUNTER — Encounter
Admission: RE | Disposition: A | Discharge status: Home / Self Care | Payer: Self-pay | Source: Home / Self Care | Attending: Vascular Surgery

## 2021-07-24 ENCOUNTER — Ambulatory Visit
Admission: RE | Admit: 2021-07-24 | Discharge: 2021-07-24 | Disposition: A | Discharge status: Home / Self Care | Payer: Medicare Other | Attending: Vascular Surgery | Admitting: Vascular Surgery

## 2021-07-24 ENCOUNTER — Encounter: Payer: Self-pay | Admitting: Vascular Surgery

## 2021-07-24 ENCOUNTER — Other Ambulatory Visit: Payer: Self-pay

## 2021-07-24 DIAGNOSIS — Z886 Allergy status to analgesic agent status: Secondary | ICD-10-CM | POA: Insufficient documentation

## 2021-07-24 DIAGNOSIS — Z992 Dependence on renal dialysis: Secondary | ICD-10-CM | POA: Insufficient documentation

## 2021-07-24 DIAGNOSIS — T82510A Breakdown (mechanical) of surgically created arteriovenous fistula, initial encounter: Secondary | ICD-10-CM | POA: Insufficient documentation

## 2021-07-24 DIAGNOSIS — Z7984 Long term (current) use of oral hypoglycemic drugs: Secondary | ICD-10-CM | POA: Insufficient documentation

## 2021-07-24 DIAGNOSIS — Y841 Kidney dialysis as the cause of abnormal reaction of the patient, or of later complication, without mention of misadventure at the time of the procedure: Secondary | ICD-10-CM | POA: Insufficient documentation

## 2021-07-24 DIAGNOSIS — Z79899 Other long term (current) drug therapy: Secondary | ICD-10-CM | POA: Diagnosis not present

## 2021-07-24 DIAGNOSIS — I120 Hypertensive chronic kidney disease with stage 5 chronic kidney disease or end stage renal disease: Secondary | ICD-10-CM | POA: Insufficient documentation

## 2021-07-24 DIAGNOSIS — T829XXA Unspecified complication of cardiac and vascular prosthetic device, implant and graft, initial encounter: Secondary | ICD-10-CM | POA: Diagnosis not present

## 2021-07-24 DIAGNOSIS — E1122 Type 2 diabetes mellitus with diabetic chronic kidney disease: Secondary | ICD-10-CM | POA: Diagnosis not present

## 2021-07-24 DIAGNOSIS — Z7982 Long term (current) use of aspirin: Secondary | ICD-10-CM | POA: Insufficient documentation

## 2021-07-24 DIAGNOSIS — N186 End stage renal disease: Secondary | ICD-10-CM | POA: Insufficient documentation

## 2021-07-24 HISTORY — PX: A/V FISTULAGRAM: CATH118298

## 2021-07-24 LAB — GLUCOSE, CAPILLARY
Glucose-Capillary: 98 mg/dL (ref 70–99)
Glucose-Capillary: 77 mg/dL (ref 70–99)

## 2021-07-24 LAB — POTASSIUM (ARMC VASCULAR LAB ONLY): Potassium (ARMC vascular lab): 4.9 (ref 3.5–5.1)

## 2021-07-24 SURGERY — A/V FISTULAGRAM
Anesthesia: Moderate Sedation | Laterality: Left

## 2021-07-24 MED ORDER — FAMOTIDINE 20 MG PO TABS: 40.0000 mg | ORAL_TABLET | Freq: Once | ORAL | Status: DC | PRN

## 2021-07-24 MED ORDER — MIDAZOLAM HCL 5 MG/5ML IJ SOLN
INTRAMUSCULAR | Status: AC
  Filled 2021-07-24: qty 5

## 2021-07-24 MED ORDER — SODIUM CHLORIDE 0.9 % IV SOLN: INTRAVENOUS | Status: DC

## 2021-07-24 MED ORDER — ONDANSETRON HCL 4 MG/2ML IJ SOLN: 4.0000 mg | Freq: Four times a day (QID) | INTRAMUSCULAR | Status: DC | PRN

## 2021-07-24 MED ORDER — METHYLPREDNISOLONE SODIUM SUCC 125 MG IJ SOLR: 125.0000 mg | Freq: Once | INTRAMUSCULAR | Status: DC | PRN

## 2021-07-24 MED ORDER — FENTANYL CITRATE (PF) 100 MCG/2ML IJ SOLN
INTRAMUSCULAR | Status: AC
  Filled 2021-07-24: qty 2

## 2021-07-24 MED ORDER — HYDROMORPHONE HCL 1 MG/ML IJ SOLN: 1.0000 mg | Freq: Once | INTRAMUSCULAR | Status: DC | PRN

## 2021-07-24 MED ORDER — HEPARIN SODIUM (PORCINE) 1000 UNIT/ML IJ SOLN
INTRAMUSCULAR | Status: AC
  Filled 2021-07-24: qty 1

## 2021-07-24 MED ORDER — CEFAZOLIN SODIUM-DEXTROSE 1-4 GM/50ML-% IV SOLN: 1.0000 g | Freq: Once | INTRAVENOUS | Status: AC

## 2021-07-24 MED ORDER — CEFAZOLIN SODIUM-DEXTROSE 1-4 GM/50ML-% IV SOLN
INTRAVENOUS | Status: AC
  Administered 2021-07-24: 1 g via INTRAVENOUS
  Filled 2021-07-24: qty 50

## 2021-07-24 MED ORDER — DIPHENHYDRAMINE HCL 50 MG/ML IJ SOLN: 50.0000 mg | Freq: Once | INTRAMUSCULAR | Status: DC | PRN

## 2021-07-24 MED ORDER — FENTANYL CITRATE (PF) 100 MCG/2ML IJ SOLN
INTRAMUSCULAR | Status: DC | PRN
  Administered 2021-07-24: 50 ug via INTRAVENOUS

## 2021-07-24 MED ORDER — MIDAZOLAM HCL 2 MG/2ML IJ SOLN
INTRAMUSCULAR | Status: DC | PRN
  Administered 2021-07-24: 2 mg via INTRAVENOUS

## 2021-07-24 MED ORDER — MIDAZOLAM HCL 2 MG/ML PO SYRP: 8.0000 mg | ORAL_SOLUTION | Freq: Once | ORAL | Status: DC | PRN

## 2021-07-24 SURGICAL SUPPLY — 7 items
COVER PROBE U/S 5X48 (MISCELLANEOUS) ×2 IMPLANT
DRAPE BRACHIAL (DRAPES) ×2 IMPLANT
KIT MICROPUNCTURE NIT STIFF (SHEATH) ×2 IMPLANT
NEEDLE ENTRY 21GA 7CM ECHOTIP (NEEDLE) ×2 IMPLANT
PACK ANGIOGRAPHY (CUSTOM PROCEDURE TRAY) ×2 IMPLANT
SHEATH BRITE TIP 6FRX5.5 (SHEATH) ×2 IMPLANT
SUT MNCRL AB 4-0 PS2 18 (SUTURE) ×2 IMPLANT

## 2021-07-24 NOTE — Progress Notes (Signed)
Dr. Lucky Cowboy at bedside, speaking with pt. Pt. Verbalized understanding of conversation.

## 2021-07-24 NOTE — Interval H&P Note (Signed)
History and Physical Interval Note:  07/24/2021 8:24 AM  Lawrence [name]  has presented today for surgery, with the diagnosis of LT arm fistulagram   End Stage Renal.  The various methods of treatment have been discussed with the patient and family. After consideration of risks, benefits and other options for treatment, the patient has consented to  Procedure(s): A/V FISTULAGRAM (Left) as a surgical intervention.  The patient's history has been reviewed, patient examined, no change in status, stable for surgery.  I have reviewed the patient's chart and labs.  Questions were answered to the patient's satisfaction.     [name]

## 2021-07-24 NOTE — Op Note (Signed)
[facility]    OPERATIVE NOTE   PROCEDURE: 1.   Left brachiocephalic arteriovenous fistula cannulation under ultrasound guidance 2.   Left arm fistulagram including central venogram   PRE-OPERATIVE DIAGNOSIS: 1. ESRD 2. Poorly functional aneurysmal left brachiocephalic AVF  POST-OPERATIVE DIAGNOSIS: same as above   SURGEON: [name], MD  ANESTHESIA: local with MCS  ESTIMATED BLOOD LOSS: 3 cc  FINDING(S): Widely patent left brachiocephalic AV fistula with aneurysmal degeneration at the access sites from multiple sticks.  There was mild narrowing at the cephalic vein subclavian vein confluence which was less than 50%.  The central venous circulation was widely patent.  SPECIMEN(S):  None  CONTRAST: 10 cc  FLUORO TIME: 0.2 minutes  MODERATE CONSCIOUS SEDATION TIME: Approximately 8 minutes with 2 mg of Versed and 50 mcg of Fentanyl   INDICATIONS: Lawrence [name] is a 64 y.o. male who presents with malfunctioning aneurysmal left brachiocephalic arteriovenous fistula.  The patient is scheduled for left arm fistulagram.  The patient is aware the risks include but are not limited to: bleeding, infection, thrombosis of the cannulated access, and possible anaphylactic reaction to the contrast.  The patient is aware of the risks of the procedure and elects to proceed forward.  DESCRIPTION: After full informed written consent was obtained, the patient was brought back to the angiography suite and placed supine upon the angiography table.  The patient was connected to monitoring equipment. Moderate conscious sedation was administered with a face to face encounter with the patient throughout the procedure with my supervision of the RN administering medicines and monitoring the patient's vital signs and mental status throughout from the start of the procedure until the patient was taken to the recovery room. The left arm was prepped and draped in the standard fashion  for a percutaneous access intervention.  Under ultrasound guidance, the initial portion of the left brachiocephalic arteriovenous fistula was cannulated with a micropuncture needle under direct ultrasound guidance where it was patent and a permanent image was performed.  The microwire was advanced into the fistula and the needle was exchanged for the a microsheath.  I then upsized to a 6 Fr Sheath and imaging was performed.  Hand injections were completed to image the access including the central venous system. This demonstrated a widely patent left brachiocephalic AV fistula with aneurysmal degeneration at the access sites from multiple sticks.  There was mild narrowing at the cephalic vein subclavian vein confluence which was less than 50%.  The central venous circulation was widely patent..  Based on the images, this patient will need no intervention today.  Surgical revision to remove the aneurysmal portions can be considered in the future.   A 4-0 Monocryl purse-string suture was sewn around the sheath.  The sheath was removed while tying down the suture.  A sterile bandage was applied to the puncture site.  COMPLICATIONS: None  CONDITION: Stable   [name]  07/24/2021 10:51 AM   This note was created with Dragon Medical transcription system. Any errors in dictation are purely unintentional.

## 2021-07-25 ENCOUNTER — Encounter: Payer: Self-pay | Admitting: Vascular Surgery

## 2021-08-06 ENCOUNTER — Other Ambulatory Visit (INDEPENDENT_AMBULATORY_CARE_PROVIDER_SITE_OTHER): Payer: Self-pay | Admitting: Nurse Practitioner

## 2021-08-06 ENCOUNTER — Other Ambulatory Visit (INDEPENDENT_AMBULATORY_CARE_PROVIDER_SITE_OTHER): Payer: Self-pay | Admitting: Vascular Surgery

## 2021-08-06 DIAGNOSIS — N186 End stage renal disease: Secondary | ICD-10-CM

## 2021-08-06 DIAGNOSIS — T829XXS Unspecified complication of cardiac and vascular prosthetic device, implant and graft, sequela: Secondary | ICD-10-CM

## 2021-08-07 ENCOUNTER — Encounter (INDEPENDENT_AMBULATORY_CARE_PROVIDER_SITE_OTHER): Payer: Medicare Other

## 2021-08-07 ENCOUNTER — Ambulatory Visit (INDEPENDENT_AMBULATORY_CARE_PROVIDER_SITE_OTHER): Payer: Medicare Other | Admitting: Nurse Practitioner

## 2021-08-07 ENCOUNTER — Other Ambulatory Visit: Payer: Self-pay

## 2021-08-07 ENCOUNTER — Encounter (INDEPENDENT_AMBULATORY_CARE_PROVIDER_SITE_OTHER): Payer: Self-pay | Admitting: Nurse Practitioner

## 2021-08-07 ENCOUNTER — Ambulatory Visit (INDEPENDENT_AMBULATORY_CARE_PROVIDER_SITE_OTHER): Payer: Medicare Other

## 2021-08-07 VITALS — BP 88/60 | HR 109 | Ht 67.0 in | Wt 160.0 lb

## 2021-08-07 DIAGNOSIS — Z992 Dependence on renal dialysis: Secondary | ICD-10-CM | POA: Diagnosis not present

## 2021-08-07 DIAGNOSIS — I10 Essential (primary) hypertension: Secondary | ICD-10-CM

## 2021-08-07 DIAGNOSIS — E1122 Type 2 diabetes mellitus with diabetic chronic kidney disease: Secondary | ICD-10-CM | POA: Diagnosis not present

## 2021-08-07 DIAGNOSIS — T829XXS Unspecified complication of cardiac and vascular prosthetic device, implant and graft, sequela: Secondary | ICD-10-CM | POA: Diagnosis not present

## 2021-08-07 DIAGNOSIS — N186 End stage renal disease: Secondary | ICD-10-CM

## 2021-08-07 NOTE — Progress Notes (Signed)
Subjective:    Patient ID: Lawrence [name], male    DOB: [birth year], 65 y.o.   MRN: [id number] Chief Complaint  Patient presents with   Follow-up    6 wk [facility] post AV fistulagram    Lawrence [name] is a 65 year old male that presents today for evaluation following left upper extremity fistulogram.  It was noted that no evidence of stenosis was seen and no intervention was performed.  The patient is concerned due to bruising in the upper part of his arm.  Happened following a dialysis session.  He denies any pain or issues with dialysis otherwise.  He denies any extensive bleeding.  Today noninvasive studies show flow volume of 977.  The flow volume is lower than when previously assessed on 07/10/2021 however there is no exact area of stenosis identified.   Review of Systems  Hematological:  Does not bruise/bleed easily.  All other systems reviewed and are negative.     Objective:   Physical Exam Vitals reviewed.  HENT:     Head: Normocephalic.  Cardiovascular:     Rate and Rhythm: Normal rate.     Pulses: Normal pulses.          Radial pulses are 2+ on the left side.     Arteriovenous access: Left arteriovenous access is present.    Comments: Good thrill and bruit  Pulmonary:     Effort: Pulmonary effort is normal.  Skin:    General: Skin is warm and dry.  Neurological:     Mental Status: He is alert and oriented to person, place, and time.  Psychiatric:        Mood and Affect: Mood normal.        Behavior: Behavior normal.        Thought Content: Thought content normal.        Judgment: Judgment normal.    BP (!) 88/60   Pulse (!) 109   Ht 5\' 7"  (1.702 m)   Wt 160 lb (72.6 kg)   BMI 25.06 kg/m   Past Medical History:  Diagnosis Date   Anemia    Cerebral hemorrhage ([place]) 2012   Chronic constipation    Chronic kidney disease    Diabetes mellitus without complication (HCC)    Hemodialysis patient ([place])    Hyperlipidemia    Hypertension    Schizophrenia ([place])     Stroke [facility])     Social History   Socioeconomic History   Marital status: Single    Spouse name: Not on file   Number of children: Not on file   Years of education: Not on file   Highest education level: Not on file  Occupational History   Not on file  Tobacco Use   Smoking status: Never   Smokeless tobacco: Never  Substance and Sexual Activity   Alcohol use: No   Drug use: No   Sexual activity: Not on file  Other Topics Concern   Not on file  Social History Narrative    Lives at group home: [facility] with 6 other people.   Social Determinants of Health   Financial Resource Strain: Not on file  Food Insecurity: Not on file  Transportation Needs: Not on file  Physical Activity: Not on file  Stress: Not on file  Social Connections: Not on file  Intimate Partner Violence: Not on file    Past Surgical History:  Procedure Laterality Date   A/V FISTULAGRAM Left 02/24/2017   Procedure: A/V  Fistulagram;  Surgeon: Katha Cabal, MD;  Location: [place] [facility];  Service: Cardiovascular;  Laterality: Left;   A/V FISTULAGRAM Left 09/03/2020   Procedure: A/V FISTULAGRAM;  Surgeon: Algernon Huxley, MD;  Location: [place] [facility];  Service: Cardiovascular;  Laterality: Left;   A/V FISTULAGRAM Left 06/27/2021   Procedure: A/V FISTULAGRAM;  Surgeon: Algernon Huxley, MD;  Location: [place] [facility];  Service: Cardiovascular;  Laterality: Left;   A/V FISTULAGRAM Left 07/24/2021   Procedure: A/V FISTULAGRAM;  Surgeon: Algernon Huxley, MD;  Location: [place] [facility];  Service: Cardiovascular;  Laterality: Left;   PERIPHERAL VASCULAR CATHETERIZATION N/A 04/17/2015   Procedure: Dialysis/Perma Catheter Insertion;  Surgeon: Katha Cabal, MD;  Location: [place] [facility];  Service: Cardiovascular;  Laterality: N/A;   PERIPHERAL VASCULAR CATHETERIZATION Left 07/30/2015   Procedure: A/V Shuntogram/Fistulagram;  Surgeon: Algernon Huxley, MD;  Location: [place]  [facility];  Service: Cardiovascular;  Laterality: Left;   PERIPHERAL VASCULAR CATHETERIZATION Left 07/30/2015   Procedure: A/V Shunt Intervention;  Surgeon: Algernon Huxley, MD;  Location: [place] [facility];  Service: Cardiovascular;  Laterality: Left;   PERIPHERAL VASCULAR CATHETERIZATION N/A 08/20/2015   Procedure: DIALYSIS/PERMA CATHETER REMOVAL;  Surgeon: Algernon Huxley, MD;  Location: [place] [facility];  Service: Cardiovascular;  Laterality: N/A;   VASCULAR SURGERY     Fistula placement    Family History  Problem Relation Age of Onset   Diabetes Mother    Kidney cancer Mother    Diabetes Sister    Stroke Brother    Prostate cancer Neg Hx    Bladder Cancer Neg Hx     Allergies  Allergen Reactions   Nsaids Other (See Comments)    Cannot take due to renal disease    CBC Latest Ref Rng & Units 03/08/2020 05/13/2019 04/11/2019  WBC 4.0 - 10.5 K/uL 2.9(L) 3.7(L) 3.3(L)  Hemoglobin 13.0 - 17.0 g/dL 10.0(L) 10.5(L) 10.6(L)  Hematocrit 39.0 - 52.0 % 30.6(L) 32.9(L) 32.9(L)  Platelets 150 - 400 K/uL 80(L) 86(L) 78(L)      CMP     Component Value Date/Time   NA 135 03/08/2020 1756   NA 140 03/21/2015 1512   K 3.0 (L) 03/08/2020 1756   K 4.0 03/21/2015 1512   CL 93 (L) 03/08/2020 1756   CL 95 (L) 03/21/2015 1512   CO2 29 03/08/2020 1756   CO2 36 (H) 03/21/2015 1512   GLUCOSE 123 (H) 03/08/2020 1756   GLUCOSE 117 (H) 03/21/2015 1512   BUN 11 03/08/2020 1756   BUN 64 (H) 03/21/2015 1512   CREATININE 3.46 (H) 03/08/2020 1756   CREATININE 7.71 (H) 03/21/2015 1512   CALCIUM 8.7 (L) 03/08/2020 1756   CALCIUM 8.5 (L) 03/21/2015 1512   PROT 6.6 03/08/2020 1756   PROT 5.8 (L) 12/13/2014 1354   ALBUMIN 3.3 (L) 03/08/2020 1756   ALBUMIN 2.5 (L) 12/13/2014 1354   AST 41 03/08/2020 1756   AST 17 12/13/2014 1354   ALT 25 03/08/2020 1756   ALT 21 12/13/2014 1354   ALKPHOS 83 03/08/2020 1756   ALKPHOS 50 12/13/2014 1354   BILITOT 0.8 03/08/2020 1756   BILITOT 0.2 12/13/2014 1354    GFRNONAA 18 (L) 03/08/2020 1756   GFRNONAA 7 (L) 03/21/2015 1512   GFRAA 21 (L) 03/08/2020 1756   GFRAA 8 (L) 03/21/2015 1512     No results found.     Assessment & Plan:   1. ESRD (end stage  renal disease) ([place]) Recommend:  The patient is doing well and currently has adequate dialysis access. The patient's dialysis center is not reporting any major access issues.  However, the flow rates in the patient's dialysis access are low, and there is no exact stenosis identified.  This is also confirmed by his recent fistulogram done 2 weeks ago with no evidence of stenosis was noted.  This raises concerns that the access is at moderate but not high risk for a problem or thrombosis and should be followed more closely.  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  The patient will follow-up with me in the office in 3 months without an HDA   2. Essential hypertension Continue antihypertensive medications as already ordered, these medications have been reviewed and there are no changes at this time.   3. Type 2 diabetes mellitus with chronic kidney disease on chronic dialysis, unspecified whether long term insulin use ([place]) Continue hypoglycemic medications as already ordered, these medications have been reviewed and there are no changes at this time.  Hgb A1C to be monitored as already arranged by primary service    Current Outpatient Medications on File Prior to Visit  Medication Sig Dispense Refill   aspirin EC 81 MG tablet Take 81 mg by mouth daily.      BANOPHEN 25 MG capsule Take 25 mg by mouth See admin instructions. Take 1 capsule (25mg ) by mouth nightly as needed for sleep - may take 1 capsule (25mg ) by mouth during the daytime as needed for itching     benztropine (COGENTIN) 0.5 MG tablet Take 0.5 mg by mouth 2 (two) times daily.     carvedilol (COREG) 25 MG tablet Take 25 mg by mouth 2 (two) times daily with a meal.      cetirizine (ZYRTEC) 10 MG tablet Take 10 mg by mouth  at bedtime.      Cholecalciferol 125 MCG (5000 UT) TABS Take 1 capsule by mouth once a week.     Coenzyme Q10 100 MG capsule Take by mouth.     diphenhydrAMINE-Zinc Acetate (BENADRYL ITCH RELIEF STICK) 2-0.1 % STCK Apply 1 application topically in the morning, at noon, and at bedtime.      docusate sodium (COLACE) 100 MG capsule Take 100 mg by mouth 2 (two) times daily.     EASYMAX TEST test strip      fluticasone (FLONASE) 50 MCG/ACT nasal spray Place into the nose.     glipiZIDE (GLUCOTROL XL) 2.5 MG 24 hr tablet Take 2.5 mg by mouth daily with breakfast.      glucose blood (EASYMAX TEST) test strip EasyMax     haloperidol (HALDOL) 10 MG tablet Take by mouth.     haloperidol (HALDOL) 5 MG tablet Take 7.5 mg by mouth at bedtime.      lidocaine-prilocaine (EMLA) cream Apply 1 application topically 3 (three) times a week. Apply over dialysis shunt 30 minutes prior to dialysis.     losartan (COZAAR) 100 MG tablet Take 1 tablet (100 mg total) by mouth daily. 30 tablet 0   midodrine (PROAMATINE) 2.5 MG tablet Take 5 mg by mouth.     mirtazapine (REMERON) 15 MG tablet Take 15 mg by mouth at bedtime.     Multiple Vitamins-Minerals (MULTIVITAMIN WITH MINERALS) tablet Take 1 tablet by mouth daily.      niacin (NIASPAN) 1000 MG CR tablet Take 1,000 mg by mouth at bedtime.      Niacin CR 1000 MG TBCR Take  by mouth.     pravastatin (PRAVACHOL) 40 MG tablet Take 40 mg by mouth at bedtime.      No current facility-administered medications on file prior to visit.    There are no Patient Instructions on file for this visit. No follow-ups on file.   Kris Hartmann, NP

## 2021-10-07 ENCOUNTER — Other Ambulatory Visit (INDEPENDENT_AMBULATORY_CARE_PROVIDER_SITE_OTHER): Payer: Self-pay | Admitting: Nurse Practitioner

## 2021-10-07 DIAGNOSIS — N186 End stage renal disease: Secondary | ICD-10-CM

## 2021-10-09 ENCOUNTER — Encounter (INDEPENDENT_AMBULATORY_CARE_PROVIDER_SITE_OTHER): Payer: Self-pay

## 2021-10-09 ENCOUNTER — Ambulatory Visit (INDEPENDENT_AMBULATORY_CARE_PROVIDER_SITE_OTHER): Payer: Medicare Other | Admitting: Nurse Practitioner

## 2021-10-09 ENCOUNTER — Encounter (INDEPENDENT_AMBULATORY_CARE_PROVIDER_SITE_OTHER): Payer: Medicare Other

## 2021-10-10 ENCOUNTER — Ambulatory Visit (INDEPENDENT_AMBULATORY_CARE_PROVIDER_SITE_OTHER): Payer: Medicare Other | Admitting: Nurse Practitioner

## 2021-10-10 ENCOUNTER — Encounter (INDEPENDENT_AMBULATORY_CARE_PROVIDER_SITE_OTHER): Payer: Self-pay | Admitting: Nurse Practitioner

## 2021-10-10 ENCOUNTER — Ambulatory Visit (INDEPENDENT_AMBULATORY_CARE_PROVIDER_SITE_OTHER): Payer: Medicare Other

## 2021-10-10 ENCOUNTER — Other Ambulatory Visit: Payer: Self-pay

## 2021-10-10 VITALS — BP 91/57 | HR 101 | Ht 67.0 in | Wt 162.0 lb

## 2021-10-10 DIAGNOSIS — Z992 Dependence on renal dialysis: Secondary | ICD-10-CM

## 2021-10-10 DIAGNOSIS — N186 End stage renal disease: Secondary | ICD-10-CM | POA: Diagnosis not present

## 2021-10-10 DIAGNOSIS — I10 Essential (primary) hypertension: Secondary | ICD-10-CM | POA: Diagnosis not present

## 2021-10-10 DIAGNOSIS — E1122 Type 2 diabetes mellitus with diabetic chronic kidney disease: Secondary | ICD-10-CM

## 2021-10-12 ENCOUNTER — Encounter (INDEPENDENT_AMBULATORY_CARE_PROVIDER_SITE_OTHER): Payer: Self-pay | Admitting: Nurse Practitioner

## 2021-10-12 NOTE — Progress Notes (Signed)
Subjective:    Patient ID: Lawrence [name], male    DOB: 1956/05/27, 65 y.o.   MRN: [id number] Chief Complaint  Patient presents with   Follow-up    3 Mo [place]    Lawrence [name] is a 65 year old male that presents today for evaluation of his left brachiocephalic AV fistula following intervention.  The patient notes that since the intervention he has been having issues with his dialysis access pulling clots.  He notes some scant bleeding following dialysis.  He also notes having alarms as well.  Today the patient has a flow volume of 1901.  He has multiple aneurysmal areas.  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   Review of Systems  Skin:  Positive for wound.  Hematological:  Bruises/bleeds easily.  All other systems reviewed and are negative.     Objective:   Physical Exam Vitals reviewed.  HENT:     Head: Normocephalic.  Cardiovascular:     Rate and Rhythm: Normal rate.     Pulses:          Radial pulses are 2+ on the left side.     Arteriovenous access: Left arteriovenous access is present. Pulmonary:     Effort: Pulmonary effort is normal.  Neurological:     Mental Status: He is alert.    BP (!) 91/57   Pulse (!) 101   Ht 5\' 7"  (1.702 m)   Wt 162 lb (73.5 kg)   BMI 25.37 kg/m   Past Medical History:  Diagnosis Date   Anemia    Cerebral hemorrhage ([place]) 2012   Chronic constipation    Chronic kidney disease    Diabetes mellitus without complication (HCC)    Hemodialysis patient ([place])    Hyperlipidemia    Hypertension    Schizophrenia ([place])    Stroke [facility])     Social History   Socioeconomic History   Marital status: Single    Spouse name: Not on file   Number of children: Not on file   Years of education: Not on file   Highest education level: Not on file  Occupational History   Not on file  Tobacco Use   Smoking status: Never   Smokeless tobacco: Never  Substance and Sexual Activity    Alcohol use: No   Drug use: No   Sexual activity: Not on file  Other Topics Concern   Not on file  Social History Narrative    Lives at group home: [facility] with 6 other people.   Social Determinants of Health   Financial Resource Strain: Not on file  Food Insecurity: Not on file  Transportation Needs: Not on file  Physical Activity: Not on file  Stress: Not on file  Social Connections: Not on file  Intimate Partner Violence: Not on file    Past Surgical History:  Procedure Laterality Date   A/V FISTULAGRAM Left 02/24/2017   Procedure: A/V Fistulagram;  Surgeon: Katha Cabal, MD;  Location: [place] [facility];  Service: Cardiovascular;  Laterality: Left;   A/V FISTULAGRAM Left 09/03/2020   Procedure: A/V FISTULAGRAM;  Surgeon: Algernon Huxley, MD;  Location: [place] [facility];  Service: Cardiovascular;  Laterality: Left;   A/V FISTULAGRAM Left 06/27/2021   Procedure: A/V FISTULAGRAM;  Surgeon: Algernon Huxley, MD;  Location: [place] [facility];  Service: Cardiovascular;  Laterality: Left;   A/V FISTULAGRAM Left 07/24/2021  Procedure: A/V FISTULAGRAM;  Surgeon: Algernon Huxley, MD;  Location: [place] [facility];  Service: Cardiovascular;  Laterality: Left;   PERIPHERAL VASCULAR CATHETERIZATION N/A 04/17/2015   Procedure: Dialysis/Perma Catheter Insertion;  Surgeon: Katha Cabal, MD;  Location: [place] [facility];  Service: Cardiovascular;  Laterality: N/A;   PERIPHERAL VASCULAR CATHETERIZATION Left 07/30/2015   Procedure: A/V Shuntogram/Fistulagram;  Surgeon: Algernon Huxley, MD;  Location: [place] [facility];  Service: Cardiovascular;  Laterality: Left;   PERIPHERAL VASCULAR CATHETERIZATION Left 07/30/2015   Procedure: A/V Shunt Intervention;  Surgeon: Algernon Huxley, MD;  Location: [place] [facility];  Service: Cardiovascular;  Laterality: Left;   PERIPHERAL VASCULAR CATHETERIZATION N/A 08/20/2015   Procedure: DIALYSIS/PERMA CATHETER REMOVAL;  Surgeon: Algernon Huxley, MD;  Location: [place] [facility];  Service: Cardiovascular;  Laterality: N/A;   VASCULAR SURGERY     Fistula placement    Family History  Problem Relation Age of Onset   Diabetes Mother    Kidney cancer Mother    Diabetes Sister    Stroke Brother    Prostate cancer Neg Hx    Bladder Cancer Neg Hx     Allergies  Allergen Reactions   Nsaids Other (See Comments)    Cannot take due to renal disease    CBC Latest Ref Rng & Units 03/08/2020 05/13/2019 04/11/2019  WBC 4.0 - 10.5 K/uL 2.9(L) 3.7(L) 3.3(L)  Hemoglobin 13.0 - 17.0 g/dL 10.0(L) 10.5(L) 10.6(L)  Hematocrit 39.0 - 52.0 % 30.6(L) 32.9(L) 32.9(L)  Platelets 150 - 400 K/uL 80(L) 86(L) 78(L)      CMP     Component Value Date/Time   NA 135 03/08/2020 1756   NA 140 03/21/2015 1512   K 3.0 (L) 03/08/2020 1756   K 4.0 03/21/2015 1512   CL 93 (L) 03/08/2020 1756   CL 95 (L) 03/21/2015 1512   CO2 29 03/08/2020 1756   CO2 36 (H) 03/21/2015 1512   GLUCOSE 123 (H) 03/08/2020 1756   GLUCOSE 117 (H) 03/21/2015 1512   BUN 11 03/08/2020 1756   BUN 64 (H) 03/21/2015 1512   CREATININE 3.46 (H) 03/08/2020 1756   CREATININE 7.71 (H) 03/21/2015 1512   CALCIUM 8.7 (L) 03/08/2020 1756   CALCIUM 8.5 (L) 03/21/2015 1512   PROT 6.6 03/08/2020 1756   PROT 5.8 (L) 12/13/2014 1354   ALBUMIN 3.3 (L) 03/08/2020 1756   ALBUMIN 2.5 (L) 12/13/2014 1354   AST 41 03/08/2020 1756   AST 17 12/13/2014 1354   ALT 25 03/08/2020 1756   ALT 21 12/13/2014 1354   ALKPHOS 83 03/08/2020 1756   ALKPHOS 50 12/13/2014 1354   BILITOT 0.8 03/08/2020 1756   BILITOT 0.2 12/13/2014 1354   GFRNONAA 18 (L) 03/08/2020 1756   GFRNONAA 7 (L) 03/21/2015 1512   GFRAA 21 (L) 03/08/2020 1756   GFRAA 8 (L) 03/21/2015 1512     No results found.     Assessment & Plan:   1. 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  I suspect that the aneurysmal areas have been causing  turbulent flow and decreasing the quality of his dialysis.  Based on this I discussed doing a jump graft around the aneurysmal area with a resection of the aneurysmal areas.  The patient will also need PermCath in place.  I discussed the risk, benefits and alternatives and the patient agrees to proceed.  2. Essential hypertension Continue antihypertensive  medications as already ordered, these medications have been reviewed and there are no changes at this time.   3. Type 2 diabetes mellitus with chronic kidney disease on chronic dialysis, unspecified whether long term insulin use ([place]) Continue hypoglycemic medications as already ordered, these medications have been reviewed and there are no changes at this time.  Hgb A1C to be monitored as already arranged by primary service    Current Outpatient Medications on File Prior to Visit  Medication Sig Dispense Refill   aspirin EC 81 MG tablet Take 81 mg by mouth daily.      BANOPHEN 25 MG capsule Take 25 mg by mouth See admin instructions. Take 1 capsule (25mg ) by mouth nightly as needed for sleep - may take 1 capsule (25mg ) by mouth during the daytime as needed for itching     benztropine (COGENTIN) 0.5 MG tablet Take 0.5 mg by mouth 2 (two) times daily.     carvedilol (COREG) 25 MG tablet Take 25 mg by mouth 2 (two) times daily with a meal.      cetirizine (ZYRTEC) 10 MG tablet Take 10 mg by mouth at bedtime.      Cholecalciferol 125 MCG (5000 UT) TABS Take 1 capsule by mouth once a week.     Coenzyme Q10 100 MG capsule Take by mouth.     diphenhydrAMINE-Zinc Acetate (BENADRYL ITCH RELIEF STICK) 2-0.1 % STCK Apply 1 application topically in the morning, at noon, and at bedtime.      docusate sodium (COLACE) 100 MG capsule Take 100 mg by mouth 2 (two) times daily.     EASYMAX TEST test strip      fluticasone (FLONASE) 50 MCG/ACT nasal spray Place into the nose.     glipiZIDE (GLUCOTROL XL) 2.5 MG 24 hr tablet Take 2.5 mg by mouth daily with  breakfast.      glucose blood (EASYMAX TEST) test strip EasyMax     haloperidol (HALDOL) 10 MG tablet Take by mouth.     haloperidol (HALDOL) 5 MG tablet Take 7.5 mg by mouth at bedtime.      lidocaine-prilocaine (EMLA) cream Apply 1 application topically 3 (three) times a week. Apply over dialysis shunt 30 minutes prior to dialysis.     losartan (COZAAR) 100 MG tablet Take 1 tablet (100 mg total) by mouth daily. 30 tablet 0   midodrine (PROAMATINE) 2.5 MG tablet Take 5 mg by mouth.     mirtazapine (REMERON) 15 MG tablet Take 15 mg by mouth at bedtime.     Multiple Vitamins-Minerals (MULTIVITAMIN WITH MINERALS) tablet Take 1 tablet by mouth daily.      niacin (NIASPAN) 1000 MG CR tablet Take 1,000 mg by mouth at bedtime.      Niacin CR 1000 MG TBCR Take by mouth.     pravastatin (PRAVACHOL) 40 MG tablet Take 40 mg by mouth at bedtime.      No current facility-administered medications on file prior to visit.    There are no Patient Instructions on file for this visit. No follow-ups on file.   Kris Hartmann, NP

## 2021-10-16 ENCOUNTER — Ambulatory Visit (INDEPENDENT_AMBULATORY_CARE_PROVIDER_SITE_OTHER): Payer: Medicare Other | Admitting: Nurse Practitioner

## 2021-10-16 ENCOUNTER — Encounter (INDEPENDENT_AMBULATORY_CARE_PROVIDER_SITE_OTHER): Payer: Medicare Other

## 2021-10-23 ENCOUNTER — Ambulatory Visit: Payer: Medicare Other | Admitting: Urology

## 2021-10-30 ENCOUNTER — Encounter (INDEPENDENT_AMBULATORY_CARE_PROVIDER_SITE_OTHER): Payer: Medicare Other

## 2021-10-30 ENCOUNTER — Ambulatory Visit (INDEPENDENT_AMBULATORY_CARE_PROVIDER_SITE_OTHER): Payer: Medicare Other | Admitting: Nurse Practitioner

## 2021-11-08 ENCOUNTER — Other Ambulatory Visit: Payer: Self-pay

## 2021-11-08 ENCOUNTER — Ambulatory Visit (INDEPENDENT_AMBULATORY_CARE_PROVIDER_SITE_OTHER): Payer: Medicare Other | Admitting: Urology

## 2021-11-08 ENCOUNTER — Encounter: Payer: Self-pay | Admitting: Urology

## 2021-11-08 VITALS — BP 98/63 | HR 130 | Ht 67.0 in | Wt 162.3 lb

## 2021-11-08 DIAGNOSIS — R972 Elevated prostate specific antigen [PSA]: Secondary | ICD-10-CM | POA: Diagnosis not present

## 2021-11-08 NOTE — Progress Notes (Signed)
11/08/2021 9:40 AM   Lawrence [name] Jul 06, 1956 [id number]  Referring provider: Sharyne Peach, MD [place],  [place] [address]  Chief Complaint  Patient presents with   Elevated PSA    HPI: Lawrence [name] is a 65 y.o. male referred for evaluation of an elevated PSA  Initially saw Dr. Erlene Quan 2017 for PSA of 3.2 which had increased from a prior PSA of 2.3.  DRE was benign and prn follow-up recommended Follow-up visit 04/2017 for PSA 7.91.  PSA was repeated and was 4.9 and continued monitoring recommended 11/2018 follow-up PSA was 6.09.  DRE was benign and continued monitoring was recommended He last saw Dr. Erlene Quan September 2020 and PSA was 14.8.  No change in DRE.  Noncontrast MRI was discussed and recommended however it does not look like this was ever scheduled PSA 08/28/2021 was 6.32 He has no complaints.  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    PMH: Past Medical History:  Diagnosis Date   Anemia    Cerebral hemorrhage ([place]) 2012   Chronic constipation    Chronic kidney disease    Diabetes mellitus without complication ([place])    Hemodialysis patient ([place])    Hyperlipidemia    Hypertension    Schizophrenia ([place])    Stroke [facility])     Surgical History: Past Surgical History:  Procedure Laterality Date   A/V FISTULAGRAM Left 02/24/2017   Procedure: A/V Fistulagram;  Surgeon: Katha Cabal, MD;  Location: [facility];  Service: Cardiovascular;  Laterality: Left;   A/V FISTULAGRAM Left 09/03/2020   Procedure: A/V FISTULAGRAM;  Surgeon: Algernon Huxley, MD;  Location: [facility];  Service: Cardiovascular;  Laterality: Left;   A/V FISTULAGRAM Left 06/27/2021   Procedure: A/V FISTULAGRAM;  Surgeon: Algernon Huxley, MD;  Location: [place] [facility];  Service: Cardiovascular;  Laterality: Left;   A/V FISTULAGRAM Left 07/24/2021   Procedure: A/V FISTULAGRAM;  Surgeon: Algernon Huxley, MD;  Location: [place] [facility];  Service:  Cardiovascular;  Laterality: Left;   PERIPHERAL VASCULAR CATHETERIZATION N/A 04/17/2015   Procedure: Dialysis/Perma Catheter Insertion;  Surgeon: Katha Cabal, MD;  Location: [place] [facility];  Service: Cardiovascular;  Laterality: N/A;   PERIPHERAL VASCULAR CATHETERIZATION Left 07/30/2015   Procedure: A/V Shuntogram/Fistulagram;  Surgeon: Algernon Huxley, MD;  Location: [place] [facility];  Service: Cardiovascular;  Laterality: Left;   PERIPHERAL VASCULAR CATHETERIZATION Left 07/30/2015   Procedure: A/V Shunt Intervention;  Surgeon: Algernon Huxley, MD;  Location: [place];  Service: Cardiovascular;  Laterality: Left;   PERIPHERAL VASCULAR CATHETERIZATION N/A 08/20/2015   Procedure: DIALYSIS/PERMA CATHETER REMOVAL;  Surgeon: Algernon Huxley, MD;  Location: [place] [facility];  Service: Cardiovascular;  Laterality: N/A;   VASCULAR SURGERY     Fistula placement    Home Medications:  Allergies as of 11/08/2021       Reactions   Nsaids Other (See Comments)   Cannot take due to renal disease        Medication List        Accurate as of November 08, 2021  9:40 AM. If you have any questions, ask your nurse or doctor.          aspirin EC 81 MG tablet Take 81 mg by mouth daily.   Banophen 25 mg capsule Generic drug: diphenhydrAMINE Take 25 mg by mouth See admin instructions. Take 1 capsule (25mg ) by mouth nightly as needed for sleep -  may take 1 capsule (25mg ) by mouth during the daytime as needed for itching   BENADRYL ITCH RELIEF STICK 2-0.1 % Stck Generic drug: diphenhydrAMINE-Zinc Acetate Apply 1 application topically in the morning, at noon, and at bedtime.   benztropine 0.5 MG tablet Commonly known as: COGENTIN Take 0.5 mg by mouth 2 (two) times daily.   carvedilol 25 MG tablet Commonly known as: COREG Take 25 mg by mouth 2 (two) times daily with a meal.   cetirizine 10 MG tablet Commonly known as: ZYRTEC Take 10 mg by mouth at bedtime.   Cholecalciferol  125 MCG (5000 UT) Tabs Take 1 capsule by mouth once a week.   Coenzyme Q10 100 MG capsule Take by mouth.   docusate sodium 100 MG capsule Commonly known as: COLACE Take 100 mg by mouth 2 (two) times daily.   EasyMax Test test strip Generic drug: glucose blood   EasyMax Test test strip Generic drug: glucose blood EasyMax   fluticasone 50 MCG/ACT nasal spray Commonly known as: FLONASE Place into the nose.   glipiZIDE 2.5 MG 24 hr tablet Commonly known as: GLUCOTROL XL Take 2.5 mg by mouth daily with breakfast.   haloperidol 5 MG tablet Commonly known as: HALDOL Take 7.5 mg by mouth at bedtime.   haloperidol 10 MG tablet Commonly known as: HALDOL Take by mouth.   lidocaine-prilocaine cream Commonly known as: EMLA Apply 1 application topically 3 (three) times a week. Apply over dialysis shunt 30 minutes prior to dialysis.   losartan 100 MG tablet Commonly known as: COZAAR Take 1 tablet (100 mg total) by mouth daily.   midodrine 2.5 MG tablet Commonly known as: PROAMATINE Take 5 mg by mouth.   midodrine 5 MG tablet Commonly known as: PROAMATINE Take by mouth.   mirtazapine 15 MG tablet Commonly known as: REMERON Take 15 mg by mouth at bedtime.   multivitamin with minerals tablet Take 1 tablet by mouth daily.   niacin 1000 MG CR tablet Commonly known as: NIASPAN Take 1,000 mg by mouth at bedtime.   Niacin CR 1000 MG Tbcr Take by mouth.   Pain Relief Extra Strength 500 MG tablet Generic drug: acetaminophen Take by mouth.   pravastatin 40 MG tablet Commonly known as: PRAVACHOL Take 40 mg by mouth at bedtime.        Allergies:  Allergies  Allergen Reactions   Nsaids Other (See Comments)    Cannot take due to renal disease    Family History: Family History  Problem Relation Age of Onset   Diabetes Mother    Kidney cancer Mother    Diabetes Sister    Stroke Brother    Prostate cancer Neg Hx    Bladder Cancer Neg Hx     Social History:   reports that he has never smoked. He has never used smokeless tobacco. He reports that he does not drink alcohol and does not use drugs.   Physical Exam: BP 98/63 (BP Location: Left Arm, Patient Position: Sitting, Cuff Size: Normal)   Pulse (!) 130   Ht 5\' 7"  (1.702 m)   Wt 162 lb 4.8 oz (73.6 kg)   BMI 25.42 kg/m   Constitutional:  Alert and oriented, No acute distress. HEENT: [place] AT, moist mucus membranes.  Trachea midline, no masses. Cardiovascular: No clubbing, cyanosis, or edema. GU: Prostate 50 g, smooth without nodules Skin: No rashes, bruises or suspicious lesions. Neurologic: Grossly intact, no focal deficits, moving all 4 extremities. Psychiatric: Normal mood and affect.  Assessment & Plan:    1.  Elevated PSA Benign DRE Most recent PSA significantly improved from prior PSA level 14 Recommend a 10-month follow-up with repeat PSA Prostate MRI for any increase   Abbie Sons, MD  [facility] [address] [place], [place] [address] [phone]

## 2021-11-10 ENCOUNTER — Encounter: Payer: Self-pay | Admitting: Urology

## 2021-11-28 ENCOUNTER — Emergency Department (HOSPITAL_COMMUNITY)
Admission: EM | Admit: 2021-11-28 | Discharge: 2021-11-28 | Disposition: A | Discharge status: Home / Self Care | Payer: Medicare Other | Attending: Emergency Medicine | Admitting: Emergency Medicine

## 2021-11-28 ENCOUNTER — Encounter (HOSPITAL_COMMUNITY): Payer: Self-pay | Admitting: *Deleted

## 2021-11-28 DIAGNOSIS — T829XXA Unspecified complication of cardiac and vascular prosthetic device, implant and graft, initial encounter: Secondary | ICD-10-CM

## 2021-11-28 DIAGNOSIS — Z79899 Other long term (current) drug therapy: Secondary | ICD-10-CM | POA: Insufficient documentation

## 2021-11-28 DIAGNOSIS — Y840 Cardiac catheterization as the cause of abnormal reaction of the patient, or of later complication, without mention of misadventure at the time of the procedure: Secondary | ICD-10-CM | POA: Insufficient documentation

## 2021-11-28 DIAGNOSIS — Z7984 Long term (current) use of oral hypoglycemic drugs: Secondary | ICD-10-CM | POA: Diagnosis not present

## 2021-11-28 DIAGNOSIS — Z7951 Long term (current) use of inhaled steroids: Secondary | ICD-10-CM | POA: Diagnosis not present

## 2021-11-28 DIAGNOSIS — N185 Chronic kidney disease, stage 5: Secondary | ICD-10-CM | POA: Diagnosis not present

## 2021-11-28 DIAGNOSIS — T8249XA Other complication of vascular dialysis catheter, initial encounter: Secondary | ICD-10-CM | POA: Diagnosis present

## 2021-11-28 DIAGNOSIS — Z7982 Long term (current) use of aspirin: Secondary | ICD-10-CM | POA: Diagnosis not present

## 2021-11-28 DIAGNOSIS — E1122 Type 2 diabetes mellitus with diabetic chronic kidney disease: Secondary | ICD-10-CM | POA: Insufficient documentation

## 2021-11-28 DIAGNOSIS — I120 Hypertensive chronic kidney disease with stage 5 chronic kidney disease or end stage renal disease: Secondary | ICD-10-CM | POA: Diagnosis not present

## 2021-11-28 LAB — CBC WITH DIFFERENTIAL/PLATELET
Abs Immature Granulocytes: 0.02 10*3/uL (ref 0.00–0.07)
Basophils Absolute: 0 10*3/uL (ref 0.0–0.1)
Basophils Relative: 0 %
Eosinophils Absolute: 0.1 10*3/uL (ref 0.0–0.5)
Eosinophils Relative: 1 %
HCT: 37.3 % — ABNORMAL LOW (ref 39.0–52.0)
Hemoglobin: 11.8 g/dL — ABNORMAL LOW (ref 13.0–17.0)
Immature Granulocytes: 0 %
Lymphocytes Relative: 28 %
Lymphs Abs: 1.4 10*3/uL (ref 0.7–4.0)
MCH: 33.5 pg (ref 26.0–34.0)
MCHC: 31.6 g/dL (ref 30.0–36.0)
MCV: 106 fL — ABNORMAL HIGH (ref 80.0–100.0)
Monocytes Absolute: 0.7 10*3/uL (ref 0.1–1.0)
Monocytes Relative: 13 %
Neutro Abs: 2.9 10*3/uL (ref 1.7–7.7)
Neutrophils Relative %: 58 %
Platelets: 72 10*3/uL — ABNORMAL LOW (ref 150–400)
RBC: 3.52 MIL/uL — ABNORMAL LOW (ref 4.22–5.81)
RDW: 15.9 % — ABNORMAL HIGH (ref 11.5–15.5)
Smear Review: DECREASED
WBC: 5.1 10*3/uL (ref 4.0–10.5)
nRBC: 0 % (ref 0.0–0.2)

## 2021-11-28 LAB — BASIC METABOLIC PANEL
Anion gap: 16 — ABNORMAL HIGH (ref 5–15)
BUN: 58 mg/dL — ABNORMAL HIGH (ref 8–23)
CO2: 28 mmol/L (ref 22–32)
Calcium: 8.3 mg/dL — ABNORMAL LOW (ref 8.9–10.3)
Chloride: 90 mmol/L — ABNORMAL LOW (ref 98–111)
Creatinine, Ser: 11.91 mg/dL — ABNORMAL HIGH (ref 0.61–1.24)
GFR, Estimated: 4 mL/min — ABNORMAL LOW (ref >60)
Glucose, Bld: 115 mg/dL — ABNORMAL HIGH (ref 70–99)
Potassium: 4.4 mmol/L (ref 3.5–5.1)
Sodium: 134 mmol/L — ABNORMAL LOW (ref 135–145)

## 2021-11-28 NOTE — ED Provider Notes (Signed)
[facility] EMERGENCY DEPARTMENT Provider Note   CSN: [id number] Arrival date & time: 11/28/21  1449     History Chief Complaint  Patient presents with   Vascular Access Problem    Lawrence [name] is a 65 y.o. male.  Patient presents to ER chief complaint of difficulty obtaining dialysis today.  He uses a left upper extremity AV fistula.  He states he was last dialyzed 2 days ago on Tuesday.  They were having some difficulty but were able to manage dialysis.  He went to dialysis today as per his normal routine, however they were unable to access his dialysis site and he was sent to the ER.  Patient otherwise denies any fevers or cough no vomiting or diarrhea no chest pain or shortness of breath.      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   Leukopenia 04/17/2019   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   Elevated ferritin 04/17/2019   Hypokalemia 10/14/2017   Anemia in ESRD (end-stage renal disease) ([place]) 10/14/2017   Thrombocytopenia ([place]) 10/14/2017   Acute urinary tract infection 10/13/2017   History of CVA (cerebrovascular accident) 10/13/2017   Chronic schizoaffective disorder ([place]) 04/09/2016   Other schizoaffective disorders ([place]) 35/68/6168   Complication of diabetes mellitus ([place]) 03/09/2016   Stage 5 chronic kidney disease ([place]) 03/09/2016   Thyroid nodule 03/09/2016   Aftercare including intermittent dialysis ([place]) 09/25/2015   Encounter for immunization  07/03/2015   Moderate protein-calorie malnutrition ([place]) 05/17/2015   End stage renal failure on dialysis ([place]) 37/29/0211   Complication of vascular dialysis catheter 04/30/2015   Chest pain, unspecified 04/26/2015   Coagulation defect, unspecified ([place]) 04/26/2015   Disorder of phosphorus metabolism, unspecified 04/26/2015   Hypoglycemia, unspecified 04/26/2015   Iron deficiency anemia, unspecified 04/26/2015   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[place]) 04/26/2015   Pain, unspecified 04/26/2015   Pruritus, unspecified 04/26/2015   Shortness of breath 04/26/2015   Type 2 diabetes mellitus without complications ([place]) 15/52/0802   Anemia in chronic kidney disease 04/26/2015   End stage renal disease ([place]) 04/26/2015   Hyperlipidemia, unspecified 04/26/2015   Essential (primary) hypertension 04/26/2015   Schizophrenia, unspecified ([place]) 04/26/2015   Thrombocytopenia, unspecified ([place]) 04/26/2015   ESRD on dialysis (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   CKD (chronic kidney disease), stage IV ([place]) 01/21/2015   Controlled type 2 diabetes mellitus without complication, without long-term current use of insulin ([place]) 01/21/2015   Benign hypertension with CKD (chronic kidney disease) stage IV ([place]) 01/21/2015   Encounter for screening for malignant neoplasm of colon 08/02/2014   Anemia 05/12/2013   Vitamin D deficiency 05/12/2013   Cerebrovascular accident (CVA) ([place]) 04/19/2013   Type 2 diabetes mellitus ([place]) 04/19/2013   HLD (hyperlipidemia) 04/19/2013   BP (high blood pressure) 04/19/2013  Past Surgical History:  Procedure Laterality Date   A/V FISTULAGRAM Left 02/24/2017   Procedure: A/V Fistulagram;  Surgeon: Katha Cabal, MD;  Location: [place] [facility];  Service: Cardiovascular;  Laterality: Left;    A/V FISTULAGRAM Left 09/03/2020   Procedure: A/V FISTULAGRAM;  Surgeon: Algernon Huxley, MD;  Location: [place] [facility];  Service: Cardiovascular;  Laterality: Left;   A/V FISTULAGRAM Left 06/27/2021   Procedure: A/V FISTULAGRAM;  Surgeon: Algernon Huxley, MD;  Location: [place] [facility];  Service: Cardiovascular;  Laterality: Left;   A/V FISTULAGRAM Left 07/24/2021   Procedure: A/V FISTULAGRAM;  Surgeon: Algernon Huxley, MD;  Location: [place] [facility];  Service: Cardiovascular;  Laterality: Left;   PERIPHERAL VASCULAR CATHETERIZATION N/A 04/17/2015   Procedure: Dialysis/Perma Catheter Insertion;  Surgeon: Katha Cabal, MD;  Location: [place] [facility];  Service: Cardiovascular;  Laterality: N/A;   PERIPHERAL VASCULAR CATHETERIZATION Left 07/30/2015   Procedure: A/V Shuntogram/Fistulagram;  Surgeon: Algernon Huxley, MD;  Location: [place] [facility];  Service: Cardiovascular;  Laterality: Left;   PERIPHERAL VASCULAR CATHETERIZATION Left 07/30/2015   Procedure: A/V Shunt Intervention;  Surgeon: Algernon Huxley, MD;  Location: [place] [facility];  Service: Cardiovascular;  Laterality: Left;   PERIPHERAL VASCULAR CATHETERIZATION N/A 08/20/2015   Procedure: DIALYSIS/PERMA CATHETER REMOVAL;  Surgeon: Algernon Huxley, MD;  Location: [place] [facility];  Service: Cardiovascular;  Laterality: N/A;   VASCULAR SURGERY     Fistula placement       Family History  Problem Relation Age of Onset   Diabetes Mother    Kidney cancer Mother    Diabetes Sister    Stroke Brother    Prostate cancer Neg Hx    Bladder Cancer Neg Hx     Social History   Tobacco Use   Smoking status: Never   Smokeless tobacco: Never  Substance Use Topics   Alcohol use: No   Drug use: No    Home Medications Prior to Admission medications   Medication Sig Start Date End Date Taking? Authorizing Provider  aspirin EC 81 MG tablet Take 81 mg by mouth daily.     [provider]  BANOPHEN 25 MG capsule Take 25 mg  by mouth See admin instructions. Take 1 capsule (25mg ) by mouth nightly as needed for sleep - may take 1 capsule (25mg ) by mouth during the daytime as needed for itching 02/14/20   [provider]  benztropine (COGENTIN) 0.5 MG tablet Take 0.5 mg by mouth 2 (two) times daily.    [provider]  carvedilol (COREG) 25 MG tablet Take 25 mg by mouth 2 (two) times daily with a meal.     [provider]  cetirizine (ZYRTEC) 10 MG tablet Take 10 mg by mouth at bedtime.     [provider]  Cholecalciferol 125 MCG (5000 UT) TABS Take 1 capsule by mouth once a week. 06/06/20   [provider]  Coenzyme Q10 100 MG capsule Take by mouth. 04/03/21   [provider]  diphenhydrAMINE-Zinc Acetate (BENADRYL ITCH RELIEF STICK) 2-0.1 % STCK Apply 1 application topically in the morning, at noon, and at bedtime.     [provider]  docusate sodium (COLACE) 100 MG capsule Take 100 mg by mouth 2 (two) times daily.    [provider]  [facility] TEST test strip  04/16/21   [provider]  fluticasone (FLONASE) 50 MCG/ACT nasal spray Place into the nose.  04/17/20   [provider]  glipiZIDE (GLUCOTROL XL) 2.5 MG 24 hr tablet Take 2.5 mg by mouth daily with breakfast.     [provider]  glucose blood (EASYMAX TEST) test strip EasyMax 04/16/21   [provider]  haloperidol (HALDOL) 10 MG tablet Take by mouth. 09/27/20   [provider]  haloperidol (HALDOL) 5 MG tablet Take 7.5 mg by mouth at bedtime.     [provider]  lidocaine-prilocaine (EMLA) cream Apply 1 application topically 3 (three) times a week. Apply over dialysis shunt 30 minutes prior to dialysis.    [provider]  losartan (COZAAR) 100 MG tablet Take 1 tablet (100 mg total) by mouth daily. 04/23/15   Loletha Grayer, MD  midodrine (PROAMATINE) 2.5 MG tablet Take 5 mg by mouth. 02/03/21   [provider]  midodrine  (PROAMATINE) 5 MG tablet Take by mouth. 11/04/21   [provider]  mirtazapine (REMERON) 15 MG tablet Take 15 mg by mouth at bedtime.    [provider]  Multiple Vitamins-Minerals (MULTIVITAMIN WITH MINERALS) tablet Take 1 tablet by mouth daily.     [provider]  niacin (NIASPAN) 1000 MG CR tablet Take 1,000 mg by mouth at bedtime.     [provider]  Niacin CR 1000 MG TBCR Take by mouth. 09/11/20   [provider]  PAIN RELIEF EXTRA STRENGTH 500 MG tablet Take by mouth. 11/06/21   [provider]  pravastatin (PRAVACHOL) 40 MG tablet Take 40 mg by mouth at bedtime.     [provider]    Allergies    Nsaids  Review of Systems   Review of Systems  Constitutional:  Negative for fever.  HENT:  Negative for ear pain and sore throat.   Eyes:  Negative for pain.  Respiratory:  Negative for cough.   Cardiovascular:  Negative for chest pain.  Gastrointestinal:  Negative for abdominal pain.  Genitourinary:  Negative for flank pain.  Musculoskeletal:  Negative for back pain.  Skin:  Negative for color change and rash.  Neurological:  Negative for syncope.  All other systems reviewed and are negative.  Physical Exam Updated Vital Signs BP 115/82 (BP Location: Right Arm)    Pulse 80    Temp 98 F (36.7 C) (Oral)    Resp 17    SpO2 100%   Physical Exam Constitutional:      Appearance: He is well-developed.  HENT:     Head: Normocephalic.     Nose: Nose normal.  Eyes:     Extraocular Movements: Extraocular movements intact.  Cardiovascular:     Rate and Rhythm: Normal rate.  Pulmonary:     Effort: Pulmonary effort is normal.  Musculoskeletal:     Comments: Left upper extremity AV fistula no active bleeding noted.  Palpable thrill appears very diminished.  Skin:    Coloration: Skin is not jaundiced.  Neurological:     Mental Status: He is alert. Mental status is at baseline.    ED Results / Procedures /  Treatments   Labs (all labs ordered are listed, but only abnormal results are displayed) Labs Reviewed  CBC WITH DIFFERENTIAL/PLATELET - Abnormal; Notable for the following components:      Result Value   RBC 3.52 (*)    Hemoglobin 11.8 (*)    HCT 37.3 (*)    MCV 106.0 (*)    RDW 15.9 (*)    Platelets 72 (*)    All  other components within normal limits  BASIC METABOLIC PANEL - Abnormal; Notable for the following components:   Sodium 134 (*)    Chloride 90 (*)    Glucose, Bld 115 (*)    BUN 58 (*)    Creatinine, Ser 11.91 (*)    Calcium 8.3 (*)    GFR, Estimated 4 (*)    Anion gap 16 (*)    All other components within normal limits    EKG EKG Interpretation  Date/Time:  Thursday November 28 2021 15:49:12 EST Ventricular Rate:  90 PR Interval:  128 QRS Duration: 80 QT Interval:  332 QTC Calculation: 406 R Axis:   48 Text Interpretation: Normal sinus rhythm Normal ECG Confirmed by Thamas Jaegers ([id number]) on 11/28/2021 5:15:59 PM  Radiology No results found.  Procedures Procedures   Medications Ordered in ED Medications - No data to display  ED Course  I have reviewed the triage vital signs and the nursing notes.  Pertinent labs & imaging results that were available during my care of the patient were reviewed by me and considered in my medical decision making (see chart for details).    MDM Rules/Calculators/A&P                         Patient otherwise neurovascularly intact bilateral extremities.  He is in no pain or discomfort.  Labs are sent is unremarkable EKG unremarkable sinus rhythm no ST elevation depressions no T wave inversions are noted QTC appears normal.  Case discussed with on-call vascular surgery, recommending I reach out to [facility].  I spoke with the on-call physician for [facility], recommended that the patient call [facility] vascular office tomorrow morning and to leave a message for the on-call physician to help arrange dialysis  for this patient.  Phone number was provided to the patient to call tomorrow morning as per vascular surgery's recommendation.     Final Clinical Impression(s) / ED Diagnoses Final diagnoses:  Complication of vascular access for dialysis, initial encounter    Rx / DC Orders ED Discharge Orders     None        [place], Greggory Brandy, MD 11/28/21 [phone]

## 2021-11-28 NOTE — Discharge Instructions (Signed)
I discussed her case with [facility].  They recommend that you call the office tomorrow morning at 8 AM.  You will need to leave a message with the on-call vascular surgeon to reach you to arrange for evaluation of your fistula.  If you are unable to reach the surgeon or have any additional difficulties, follow-up at [facility] emergency department.

## 2021-11-28 NOTE — ED Triage Notes (Signed)
Dialysis port clogged

## 2021-11-29 ENCOUNTER — Inpatient Hospital Stay
Admission: EM | Admit: 2021-11-29 | Discharge: 2021-12-05 | DRG: 252 | Disposition: A | Discharge status: Skilled Nursing Facility | Payer: Medicare Other | Source: Skilled Nursing Facility | Attending: Internal Medicine | Admitting: Internal Medicine

## 2021-11-29 ENCOUNTER — Encounter: Payer: Self-pay | Admitting: Emergency Medicine

## 2021-11-29 ENCOUNTER — Other Ambulatory Visit: Payer: Self-pay

## 2021-11-29 DIAGNOSIS — T82590A Other mechanical complication of surgically created arteriovenous fistula, initial encounter: Secondary | ICD-10-CM | POA: Diagnosis present

## 2021-11-29 DIAGNOSIS — Z833 Family history of diabetes mellitus: Secondary | ICD-10-CM | POA: Diagnosis not present

## 2021-11-29 DIAGNOSIS — N186 End stage renal disease: Secondary | ICD-10-CM | POA: Diagnosis present

## 2021-11-29 DIAGNOSIS — G253 Myoclonus: Secondary | ICD-10-CM | POA: Diagnosis present

## 2021-11-29 DIAGNOSIS — Z79899 Other long term (current) drug therapy: Secondary | ICD-10-CM | POA: Diagnosis not present

## 2021-11-29 DIAGNOSIS — Y832 Surgical operation with anastomosis, bypass or graft as the cause of abnormal reaction of the patient, or of later complication, without mention of misadventure at the time of the procedure: Secondary | ICD-10-CM | POA: Diagnosis present

## 2021-11-29 DIAGNOSIS — T82590S Other mechanical complication of surgically created arteriovenous fistula, sequela: Secondary | ICD-10-CM | POA: Diagnosis not present

## 2021-11-29 DIAGNOSIS — E875 Hyperkalemia: Secondary | ICD-10-CM | POA: Diagnosis present

## 2021-11-29 DIAGNOSIS — Z823 Family history of stroke: Secondary | ICD-10-CM | POA: Diagnosis not present

## 2021-11-29 DIAGNOSIS — Z992 Dependence on renal dialysis: Secondary | ICD-10-CM | POA: Diagnosis present

## 2021-11-29 DIAGNOSIS — D631 Anemia in chronic kidney disease: Secondary | ICD-10-CM | POA: Diagnosis present

## 2021-11-29 DIAGNOSIS — Z7982 Long term (current) use of aspirin: Secondary | ICD-10-CM | POA: Diagnosis not present

## 2021-11-29 DIAGNOSIS — T82898A Other specified complication of vascular prosthetic devices, implants and grafts, initial encounter: Secondary | ICD-10-CM | POA: Diagnosis not present

## 2021-11-29 DIAGNOSIS — N189 Chronic kidney disease, unspecified: Secondary | ICD-10-CM | POA: Diagnosis present

## 2021-11-29 DIAGNOSIS — D649 Anemia, unspecified: Secondary | ICD-10-CM | POA: Diagnosis present

## 2021-11-29 DIAGNOSIS — T82510A Breakdown (mechanical) of surgically created arteriovenous fistula, initial encounter: Secondary | ICD-10-CM | POA: Diagnosis present

## 2021-11-29 DIAGNOSIS — Z20822 Contact with and (suspected) exposure to covid-19: Secondary | ICD-10-CM | POA: Diagnosis present

## 2021-11-29 DIAGNOSIS — E1122 Type 2 diabetes mellitus with diabetic chronic kidney disease: Secondary | ICD-10-CM | POA: Diagnosis present

## 2021-11-29 DIAGNOSIS — Z7984 Long term (current) use of oral hypoglycemic drugs: Secondary | ICD-10-CM

## 2021-11-29 DIAGNOSIS — Z886 Allergy status to analgesic agent status: Secondary | ICD-10-CM

## 2021-11-29 DIAGNOSIS — Z8673 Personal history of transient ischemic attack (TIA), and cerebral infarction without residual deficits: Secondary | ICD-10-CM

## 2021-11-29 DIAGNOSIS — N179 Acute kidney failure, unspecified: Secondary | ICD-10-CM | POA: Diagnosis present

## 2021-11-29 DIAGNOSIS — F209 Schizophrenia, unspecified: Secondary | ICD-10-CM | POA: Diagnosis present

## 2021-11-29 DIAGNOSIS — N2581 Secondary hyperparathyroidism of renal origin: Secondary | ICD-10-CM | POA: Diagnosis present

## 2021-11-29 DIAGNOSIS — I120 Hypertensive chronic kidney disease with stage 5 chronic kidney disease or end stage renal disease: Secondary | ICD-10-CM | POA: Diagnosis present

## 2021-11-29 DIAGNOSIS — I129 Hypertensive chronic kidney disease with stage 1 through stage 4 chronic kidney disease, or unspecified chronic kidney disease: Secondary | ICD-10-CM | POA: Diagnosis not present

## 2021-11-29 DIAGNOSIS — T82590D Other mechanical complication of surgically created arteriovenous fistula, subsequent encounter: Secondary | ICD-10-CM | POA: Diagnosis not present

## 2021-11-29 DIAGNOSIS — D696 Thrombocytopenia, unspecified: Secondary | ICD-10-CM | POA: Diagnosis present

## 2021-11-29 DIAGNOSIS — E785 Hyperlipidemia, unspecified: Secondary | ICD-10-CM | POA: Diagnosis present

## 2021-11-29 DIAGNOSIS — T829XXA Unspecified complication of cardiac and vascular prosthetic device, implant and graft, initial encounter: Secondary | ICD-10-CM

## 2021-11-29 DIAGNOSIS — N184 Chronic kidney disease, stage 4 (severe): Secondary | ICD-10-CM | POA: Diagnosis not present

## 2021-11-29 DIAGNOSIS — N185 Chronic kidney disease, stage 5: Secondary | ICD-10-CM | POA: Diagnosis not present

## 2021-11-29 LAB — CBC
HCT: 36.6 % — ABNORMAL LOW (ref 39.0–52.0)
HCT: 36.6 % — ABNORMAL LOW (ref 39.0–52.0)
Hemoglobin: 11.6 g/dL — ABNORMAL LOW (ref 13.0–17.0)
Hemoglobin: 11.9 g/dL — ABNORMAL LOW (ref 13.0–17.0)
MCH: 32.4 pg (ref 26.0–34.0)
MCH: 32.7 pg (ref 26.0–34.0)
MCHC: 31.7 g/dL (ref 30.0–36.0)
MCHC: 32.5 g/dL (ref 30.0–36.0)
MCV: 102.2 fL — ABNORMAL HIGH (ref 80.0–100.0)
MCV: 100.5 fL — ABNORMAL HIGH (ref 80.0–100.0)
Platelets: 73 10*3/uL — ABNORMAL LOW (ref 150–400)
Platelets: 79 10*3/uL — ABNORMAL LOW (ref 150–400)
RBC: 3.58 MIL/uL — ABNORMAL LOW (ref 4.22–5.81)
RBC: 3.64 MIL/uL — ABNORMAL LOW (ref 4.22–5.81)
RDW: 15.9 % — ABNORMAL HIGH (ref 11.5–15.5)
RDW: 15.9 % — ABNORMAL HIGH (ref 11.5–15.5)
WBC: 4.8 10*3/uL (ref 4.0–10.5)
WBC: 4.7 10*3/uL (ref 4.0–10.5)
nRBC: 0 % (ref 0.0–0.2)
nRBC: 0 % (ref 0.0–0.2)

## 2021-11-29 LAB — RESP PANEL BY RT-PCR (FLU A&B, COVID) ARPGX2
Influenza A by PCR: NEGATIVE
Influenza B by PCR: NEGATIVE
SARS Coronavirus 2 by RT PCR: NEGATIVE

## 2021-11-29 LAB — COMPREHENSIVE METABOLIC PANEL
ALT: 13 U/L (ref 0–44)
AST: 11 U/L — ABNORMAL LOW (ref 15–41)
Albumin: 3.6 g/dL (ref 3.5–5.0)
Alkaline Phosphatase: 91 U/L (ref 38–126)
Anion gap: 14 (ref 5–15)
BUN: 69 mg/dL — ABNORMAL HIGH (ref 8–23)
CO2: 28 mmol/L (ref 22–32)
Calcium: 8.8 mg/dL — ABNORMAL LOW (ref 8.9–10.3)
Chloride: 95 mmol/L — ABNORMAL LOW (ref 98–111)
Creatinine, Ser: 13.55 mg/dL — ABNORMAL HIGH (ref 0.61–1.24)
GFR, Estimated: 4 mL/min — ABNORMAL LOW (ref >60)
Glucose, Bld: 98 mg/dL (ref 70–99)
Potassium: 4.6 mmol/L (ref 3.5–5.1)
Sodium: 137 mmol/L (ref 135–145)
Total Bilirubin: 0.7 mg/dL (ref 0.3–1.2)
Total Protein: 7.2 g/dL (ref 6.5–8.1)

## 2021-11-29 LAB — CREATININE, SERUM
Creatinine, Ser: 14.03 mg/dL — ABNORMAL HIGH (ref 0.61–1.24)
GFR, Estimated: 4 mL/min — ABNORMAL LOW (ref >60)

## 2021-11-29 MED ORDER — HEPARIN SODIUM (PORCINE) 5000 UNIT/ML IJ SOLN: 5000.0000 [IU] | Freq: Three times a day (TID) | INTRAMUSCULAR | Status: DC

## 2021-11-29 MED ORDER — ACETAMINOPHEN 650 MG RE SUPP: 650.0000 mg | Freq: Four times a day (QID) | RECTAL | Status: DC | PRN

## 2021-11-29 MED ORDER — LOSARTAN POTASSIUM 50 MG PO TABS
100.0000 mg | ORAL_TABLET | Freq: Every day | ORAL | Status: DC
  Administered 2021-11-29: 21:00:00 100 mg via ORAL
  Filled 2021-11-29: qty 2

## 2021-11-29 MED ORDER — NIACIN ER (ANTIHYPERLIPIDEMIC) 500 MG PO TBCR
1000.0000 mg | EXTENDED_RELEASE_TABLET | Freq: Every day | ORAL | Status: DC
  Administered 2021-11-29 – 2021-12-04 (×6): 1000 mg via ORAL
  Filled 2021-11-29 (×8): qty 2

## 2021-11-29 MED ORDER — PRAVASTATIN SODIUM 20 MG PO TABS
40.0000 mg | ORAL_TABLET | Freq: Every day | ORAL | Status: DC
  Administered 2021-11-29 – 2021-12-04 (×6): 40 mg via ORAL
  Filled 2021-11-29 (×6): qty 2

## 2021-11-29 MED ORDER — MIDODRINE HCL 5 MG PO TABS
5.0000 mg | ORAL_TABLET | Freq: Two times a day (BID) | ORAL | Status: DC
  Administered 2021-11-30 – 2021-12-03 (×7): 5 mg via ORAL
  Filled 2021-11-29 (×7): qty 1

## 2021-11-29 MED ORDER — ACETAMINOPHEN 325 MG PO TABS
650.0000 mg | ORAL_TABLET | Freq: Four times a day (QID) | ORAL | Status: DC | PRN
  Administered 2021-12-03 – 2021-12-05 (×2): 650 mg via ORAL
  Filled 2021-11-29 (×2): qty 2

## 2021-11-29 MED ORDER — MIRTAZAPINE 15 MG PO TABS
15.0000 mg | ORAL_TABLET | Freq: Every day | ORAL | Status: DC
  Administered 2021-11-29 – 2021-12-04 (×6): 15 mg via ORAL
  Filled 2021-11-29 (×6): qty 1

## 2021-11-29 MED ORDER — CARVEDILOL 25 MG PO TABS
25.0000 mg | ORAL_TABLET | Freq: Two times a day (BID) | ORAL | Status: DC
  Administered 2021-11-30 – 2021-12-05 (×6): 25 mg via ORAL
  Filled 2021-11-29 (×8): qty 1

## 2021-11-29 MED ORDER — INSULIN ASPART 100 UNIT/ML IJ SOLN
0.0000 [IU] | Freq: Three times a day (TID) | INTRAMUSCULAR | Status: DC
  Administered 2021-11-30 – 2021-12-05 (×5): 1 [IU] via SUBCUTANEOUS
  Filled 2021-11-29 (×5): qty 1

## 2021-11-29 NOTE — H&P (Signed)
History and Physical    Lawrence [name]:[id number] DOB: 04-12-1956 DOA: 11/29/2021  PCP: Lawrence Peach, MD    Patient coming from:  Home    Chief Complaint:  Dysfunction of dialysis access -left AV fistula   HPI:  Lawrence [name] is a 65 y.o. male sent to the emergency room by nephrologist for dysfunction of left AV fistula and inability to dialyze.  Patient receives hemodialysis on Tuesdays Thursdays and Saturdays.  Patient did go back to nephrology today to attempt dialysis and they were unable to access the catheter.  Clinically patient reports that he is stable he does not have any headaches blurred vision shortness of breath chest pain palpitations edema abdominal pain fevers chills and review of systems is otherwise negative.  Pt has past medical history of end-stage renal disease on hemodialysis, hypertension, hyperlipidemia, history of cerebral hemorrhage, diabetes mellitus type 2, schizophrenia, hyperkalemia.  ED Course:  Vitals:   11/29/21 1355 11/29/21 1358  BP: 111/84   Pulse: 79   Resp: 16   Temp: 97.8 F (36.6 C)   TempSrc: Oral   SpO2: 100%   Weight:  73.6 kg  Height:  5\' 7"  (1.702 m)  In the emergency room patient is alert awake oriented afebrile oxygenating 100% on room air. CMP shows potassium of 4.6 bicarb of 28 BUN of 69 creatinine of 13.55, anion gap of 14, normal LFTs, CBC shows hemoglobin of 11.6 MCV of 102.2 thrombocytopenia with platelet counts of 73,000 WBC of 4.8.  Review of Systems:  Review of Systems  All other systems reviewed and are negative.   Past Medical History:  Diagnosis Date   Anemia    Cerebral hemorrhage (Lawrence [name]) 2012   Chronic constipation    Chronic kidney disease    Diabetes mellitus without complication (Lawrence [name])    Hemodialysis patient ([facility]rence [name])    Hyperlipidemia    Hypertension    Schizophrenia (Lawrence [name])    Stroke [facility])     Past Surgical History:  Procedure Laterality Date   A/V FISTULAGRAM Left 02/24/2017   Procedure:  A/V Fistulagram;  Surgeon: Katha Cabal, MD;  Location: [name];  Service: Cardiovascular;  Laterality: Left;   A/V FISTULAGRAM Left 09/03/2020   Procedure: A/V FISTULAGRAM;  Surgeon: Algernon Huxley, MD;  Location: [name];  Service: Cardiovascular;  Laterality: Left;   A/V FISTULAGRAM Left 06/27/2021   Procedure: A/V FISTULAGRAM;  Surgeon: Algernon Huxley, MD;  Location: [name];  Service: Cardiovascular;  Laterality: Left;   A/V FISTULAGRAM Left 07/24/2021   Procedure: A/V FISTULAGRAM;  Surgeon: Algernon Huxley, MD;  Location: [name];  Service: Cardiovascular;  Laterality: Left;   PERIPHERAL VASCULAR CATHETERIZATION N/A 04/17/2015   Procedure: Dialysis/Perma Catheter Insertion;  Surgeon: Katha Cabal, MD;  Location: [name];  Service: Cardiovascular;  Laterality: N/A;   PERIPHERAL VASCULAR CATHETERIZATION Left 07/30/2015   Procedure: A/V Shuntogram/Fistulagram;  Surgeon: Algernon Huxley, MD;  Location: [name];  Service: Cardiovascular;  Laterality: Left;   PERIPHERAL VASCULAR CATHETERIZATION Left 07/30/2015   Procedure: A/V Shunt Intervention;  Surgeon: Algernon Huxley, MD;  Location: [name];  Service: Cardiovascular;  Laterality: Left;   PERIPHERAL VASCULAR CATHETERIZATION N/A 08/20/2015   Procedure: DIALYSIS/PERMA CATHETER REMOVAL;  Surgeon: Algernon Huxley, MD;  Location: [name];  Service: Cardiovascular;  Laterality: N/A;   VASCULAR SURGERY     Fistula placement  reports that he has never smoked. He has never used smokeless tobacco. He reports that he does not drink alcohol and does not use drugs.  Allergies  Allergen Reactions   Nsaids Other (See Comments)    Cannot take due to renal disease    Family History  Problem Relation Age of Onset   Diabetes Mother    Kidney cancer Mother    Diabetes Sister    Stroke Brother    Prostate cancer Neg Hx    Bladder Cancer Neg Hx     Prior to  Admission medications   Medication Sig Start Date End Date Taking? Authorizing Provider  aspirin EC 81 MG tablet Take 81 mg by mouth daily.     [provider]  BANOPHEN 25 MG capsule Take 25 mg by mouth See admin instructions. Take 1 capsule (25mg ) by mouth nightly as needed for sleep - may take 1 capsule (25mg ) by mouth during the daytime as needed for itching 02/14/20   [provider]  benztropine (COGENTIN) 0.5 MG tablet Take 0.5 mg by mouth 2 (two) times daily.    [provider]  carvedilol (COREG) 25 MG tablet Take 25 mg by mouth 2 (two) times daily with a meal.     [provider]  cetirizine (ZYRTEC) 10 MG tablet Take 10 mg by mouth at bedtime.     [provider]  Cholecalciferol 125 MCG (5000 UT) TABS Take 1 capsule by mouth once a week. 06/06/20   [provider]  Coenzyme Q10 100 MG capsule Take by mouth. 04/03/21   [provider]  diphenhydrAMINE-Zinc Acetate (BENADRYL ITCH RELIEF STICK) 2-0.1 % STCK Apply 1 application topically in the morning, at noon, and at bedtime.     [provider]  docusate sodium (COLACE) 100 MG capsule Take 100 mg by mouth 2 (two) times daily.    [provider]  [name] TEST test strip  04/16/21   [provider]  fluticasone (FLONASE) 50 MCG/ACT nasal spray Place into the nose. 04/17/20   [provider]  glipiZIDE (GLUCOTROL XL) 2.5 MG 24 hr tablet Take 2.5 mg by mouth daily with breakfast.     [provider]  glucose blood (EASYMAX TEST) test strip EasyMax 04/16/21   [provider]  haloperidol (HALDOL) 10 MG tablet Take by mouth. 09/27/20   [provider]  haloperidol (HALDOL) 5 MG tablet Take 7.5 mg by mouth at bedtime.     [provider]  lidocaine-prilocaine (EMLA) cream Apply 1 application topically 3 (three) times a week. Apply over dialysis shunt 30 minutes prior to dialysis.    [provider]  losartan  (COZAAR) 100 MG tablet Take 1 tablet (100 mg total) by mouth daily. 04/23/15   Loletha Grayer, MD  midodrine (PROAMATINE) 2.5 MG tablet Take 5 mg by mouth. 02/03/21   [provider]  midodrine (PROAMATINE) 5 MG tablet Take by mouth. 11/04/21   [provider]  mirtazapine (REMERON) 15 MG tablet Take 15 mg by mouth at bedtime.    [provider]  Multiple Vitamins-Minerals (MULTIVITAMIN WITH MINERALS) tablet Take 1 tablet by mouth daily.     [provider]  niacin (NIASPAN) 1000 MG CR tablet Take 1,000 mg by mouth at bedtime.     [provider]  Niacin CR 1000 MG TBCR Take by mouth. 09/11/20   [provider]  PAIN RELIEF EXTRA STRENGTH 500 MG tablet Take by mouth. 11/06/21  [provider]  pravastatin (PRAVACHOL) 40 MG tablet Take 40 mg by mouth at bedtime.     [provider]    Physical Exam: Vitals:   11/29/21 1355 11/29/21 1358  BP: 111/84   Pulse: 79   Resp: 16   Temp: 97.8 F (36.6 C)   TempSrc: Oral   SpO2: 100%   Weight:  73.6 kg  Height:  5\' 7"  (1.702 m)   Physical Exam Vitals and nursing note reviewed.  Constitutional:      General: He is not in acute distress.    Appearance: He is not ill-appearing.  HENT:     Head: Normocephalic and atraumatic.     Right Ear: External ear normal.     Left Ear: External ear normal.     Nose: Nose normal.     Mouth/Throat:     Mouth: Mucous membranes are moist.  Eyes:     Extraocular Movements: Extraocular movements intact.     Pupils: Pupils are equal, round, and reactive to light.  Cardiovascular:     Rate and Rhythm: Normal rate and regular rhythm.     Pulses: Normal pulses.          Dorsalis pedis pulses are 2+ on the right side and 2+ on the left side.       Posterior tibial pulses are 2+ on the right side and 2+ on the left side.     Heart sounds: Normal heart sounds.  Pulmonary:     Effort: Pulmonary effort is normal.     Breath sounds: Normal  breath sounds.  Abdominal:     General: Bowel sounds are normal.     Palpations: Abdomen is soft.  Musculoskeletal:     Right lower leg: No edema.     Left lower leg: No edema.  Neurological:     General: No focal deficit present.     Mental Status: He is alert and oriented to person, place, and time.  Psychiatric:        Mood and Affect: Mood normal.        Behavior: Behavior normal.     Labs on Admission: I have personally reviewed following labs and imaging studies  No results for input(s): CKTOTAL, CKMB, TROPONINI in the last 72 hours. [name] Results  Component Value Date   WBC 4.8 11/29/2021   HGB 11.6 (L) 11/29/2021   HCT 36.6 (L) 11/29/2021   MCV 102.2 (H) 11/29/2021   PLT 73 (L) 11/29/2021    Recent Labs  [name] 11/29/21 1400  NA 137  K 4.6  CL 95*  CO2 28  BUN 69*  CREATININE 13.55*  CALCIUM 8.8*  PROT 7.2  BILITOT 0.7  ALKPHOS 91  ALT 13  AST 11*  GLUCOSE 98   No results found for: CHOL, HDL, LDLCALC, TRIG No results found for: DDIMER Invalid input(s): POCBNP   COVID-19 Labs No results for input(s): DDIMER, FERRITIN, LDH, CRP in the last 72 hours. [name] Results  Component Value Date   [place] NEGATIVE 11/29/2021   [place] NEGATIVE 08/31/2020   [place] NEGATIVE 03/08/2020    Radiological Exams on Admission: No results found.  EKG: Independently reviewed.  SR 90 prominent tw in lead II o/w normal.   Assessment/Plan: Principal Problem:   Dialysis AV fistula malfunction (HCC) Active Problems:   ESRD on dialysis ([place])   Benign hypertension with CKD (chronic kidney disease) stage IV (HCC)   Anemia in ESRD (end-stage renal disease) ([place]) Dialysis AV fistula  malfunction: Patient admitted for dialysis access malfunction to med telemetry floor. Npo after midnight.  Vascular consult- Dr. Lorenso Courier - AM message sent. Nephrology Consult- Dr. Holley Raring- Am message sent.   ESRD on HD: Nephrology consult. Home meds restarted partially , med rec is  pending.  Htn: Blood pressure 111/84, pulse 79, temperature 97.8 F (36.6 C), temperature source Oral, resp. rate 16, height 5\' 7"  (1.702 m), weight 73.6 kg, SpO2 100 %. Home regimen of BP meds : Losartan, midodrine, Coreg.  Anemia and end-stage renal disease: Follow CBCs type and screen transfuse as indicated and necessary.  Thrombocytopenia: Will follow platelet count.   DVT prophylaxis:  SCD's heparin Held due to thrombocytopenia.   DVT prophylaxis:  Heparin    Code Status:  Full code    Family Communication:  Graves,Alfreda (Other)  [phone] (Home Phone)   Disposition Plan:  Home    Consults called:  Nephrology-Dr Esco  Vascular -Dr. Holley Raring.   Admission status: Inpatient.     Para Skeans MD [facility] [phone] How to contact the [facility] Attending or Consulting provider [place] or covering provider during after hours [place], for this patient.    Check the care team in [facility] and look for a) attending/consulting TR[facility] provider listed and b) the [facility] team listed Log into [email or web address] and use [place]'s universal password to access. If you do not have the password, please contact the hospital operator. Locate the [facility] provider you are looking for under [facility] and page to a number that you can be directly reached. If you still have difficulty reaching the provider, please page the [facility] (Director on Call) for the Hospitalists listed on amion for assistance. [email or web address] Password [facility] 11/29/2021, 7:39 PM

## 2021-11-29 NOTE — ED Triage Notes (Signed)
Pt comes into the ED via POV c/o clogged dialysis port with his last treatment having been Tuesday.  Pt states he is asymptomatic at this time and is in NAD.  Pt denies any other needs other than new dialysis access to be completed/.

## 2021-11-29 NOTE — ED Provider Notes (Signed)
[facility] Emergency Department Provider Note    ____________________________________________   I have reviewed the triage vital signs and the nursing notes.   HISTORY  Chief Complaint Vascular Access Problem   History limited by: Not Limited   HPI Lawrence [name] is a 65 y.o. male who presents to the emergency department today because of problems with left av fistula. Patient states it was last able to be used during dialysis on Tuesday. When he went back yesterday (Thursday) and they were not able to access his fistula. Went again this morning and again they were not able to access it. Nephrology wanted patient to come to the ED for admission for dialysis catheter placement. Patient denies any shortness of breath. Denies any swelling.   Records reviewed. Per medical record review patient has a history of HLD, HTN.  Past Medical History:  Diagnosis Date   Anemia    Cerebral hemorrhage ([place]) 2012   Chronic constipation    Chronic kidney disease    Diabetes mellitus without complication ([place])    Hemodialysis patient ([place])    Hyperlipidemia    Hypertension    Schizophrenia ([place])    Stroke [facility])     Patient Active Problem List   Diagnosis Date Noted   Hyperkalemia 06/04/2020   Fluid overload, unspecified 05/30/2020   Schizophrenia ([place]) 03/09/2020   Anuria 10/02/2019   Xerosis cutis 10/02/2019   Secondary hyperparathyroidism of renal origin ([place]) 06/28/2019   Leukopenia 04/17/2019   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   Elevated ferritin 04/17/2019   Hypokalemia 10/14/2017   Anemia in ESRD (end-stage renal disease) ([place]) 10/14/2017   Thrombocytopenia ([place]) 10/14/2017   Acute urinary tract infection 10/13/2017   History of CVA (cerebrovascular accident) 10/13/2017   Chronic schizoaffective disorder ([place]) 04/09/2016   Other schizoaffective disorders ([place]) 44/92/0100   Complication of diabetes mellitus ([place]) 03/09/2016   Stage 5 chronic kidney disease  ([place]) 03/09/2016   Thyroid nodule 03/09/2016   Aftercare including intermittent dialysis ([place]) 09/25/2015   Encounter for immunization 07/03/2015   Moderate protein-calorie malnutrition ([place]) 05/17/2015   End stage renal failure on dialysis ([place]) 71/21/9758   Complication of vascular dialysis catheter 04/30/2015   Chest pain, unspecified 04/26/2015   Coagulation defect, unspecified ([place]) 04/26/2015   Disorder of phosphorus metabolism, unspecified 04/26/2015   Hypoglycemia, unspecified 04/26/2015   Iron deficiency anemia, unspecified 04/26/2015   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[place]) 04/26/2015   Pain, unspecified 04/26/2015   Pruritus, unspecified 04/26/2015   Shortness of breath 04/26/2015   Type 2 diabetes mellitus without complications ([place]) 83/25/4982   Anemia in chronic kidney disease 04/26/2015   End stage renal disease ([place]) 04/26/2015   Hyperlipidemia, unspecified 04/26/2015   Essential (primary) hypertension 04/26/2015   Schizophrenia, unspecified ([place]) 04/26/2015   Thrombocytopenia, unspecified ([place]) 04/26/2015   ESRD on dialysis ([place]) 04/16/2015   ESRD (end stage renal disease) ([place]) 04/16/2015   HTN (hypertension) 04/16/2015   Goiter 02/27/2015   Dry mouth 02/27/2015   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   CKD (chronic kidney disease), stage IV ([place]) 01/21/2015   Controlled type 2 diabetes mellitus without complication, without long-term current use of insulin ([place]) 01/21/2015   Benign hypertension with CKD (chronic kidney disease) stage IV ([place]) 01/21/2015   Encounter for screening for malignant neoplasm of colon 08/02/2014   Anemia 05/12/2013   Vitamin D deficiency 05/12/2013   Cerebrovascular accident (CVA) ([place]) 04/19/2013   Type 2 diabetes mellitus ([place]) 04/19/2013   HLD (hyperlipidemia) 04/19/2013  BP (high blood pressure) 04/19/2013    Past Surgical History:  Procedure Laterality Date   A/V FISTULAGRAM Left 02/24/2017    Procedure: A/V Fistulagram;  Surgeon: Katha Cabal, MD;  Location: [place] CV [facility];  Service: Cardiovascular;  Laterality: Left;   A/V FISTULAGRAM Left 09/03/2020   Procedure: A/V FISTULAGRAM;  Surgeon: Algernon Huxley, MD;  Location: [place] [facility];  Service: Cardiovascular;  Laterality: Left;   A/V FISTULAGRAM Left 06/27/2021   Procedure: A/V FISTULAGRAM;  Surgeon: Algernon Huxley, MD;  Location: [place] [facility];  Service: Cardiovascular;  Laterality: Left;   A/V FISTULAGRAM Left 07/24/2021   Procedure: A/V FISTULAGRAM;  Surgeon: Algernon Huxley, MD;  Location: [place] CV [facility];  Service: Cardiovascular;  Laterality: Left;   PERIPHERAL VASCULAR CATHETERIZATION N/A 04/17/2015   Procedure: Dialysis/Perma Catheter Insertion;  Surgeon: Katha Cabal, MD;  Location: [place] CV LAB;  Service: Cardiovascular;  Laterality: N/A;   PERIPHERAL VASCULAR CATHETERIZATION Left 07/30/2015   Procedure: A/V Shuntogram/Fistulagram;  Surgeon: Algernon Huxley, MD;  Location: [place] CV LAB;  Service: Cardiovascular;  Laterality: Left;   PERIPHERAL VASCULAR CATHETERIZATION Left 07/30/2015   Procedure: A/V Shunt Intervention;  Surgeon: Algernon Huxley, MD;  Location: [place] CV LAB;  Service: Cardiovascular;  Laterality: Left;   PERIPHERAL VASCULAR CATHETERIZATION N/A 08/20/2015   Procedure: DIALYSIS/PERMA CATHETER REMOVAL;  Surgeon: Algernon Huxley, MD;  Location: [place] [facility] LAB;  Service: Cardiovascular;  Laterality: N/A;   VASCULAR SURGERY     Fistula placement    Prior to Admission medications   Medication Sig Start Date End Date Taking? Authorizing Provider  aspirin EC 81 MG tablet Take 81 mg by mouth daily.     [provider]  BANOPHEN 25 MG capsule Take 25 mg by mouth See admin instructions. Take 1 capsule (25mg ) by mouth nightly as needed for sleep - may take 1 capsule (25mg ) by mouth during the daytime as needed for itching 02/14/20   [provider]  benztropine  (COGENTIN) 0.5 MG tablet Take 0.5 mg by mouth 2 (two) times daily.    [provider]  carvedilol (COREG) 25 MG tablet Take 25 mg by mouth 2 (two) times daily with a meal.     [provider]  cetirizine (ZYRTEC) 10 MG tablet Take 10 mg by mouth at bedtime.     [provider]  Cholecalciferol 125 MCG (5000 UT) TABS Take 1 capsule by mouth once a week. 06/06/20   [provider]  Coenzyme Q10 100 MG capsule Take by mouth. 04/03/21   [provider]  diphenhydrAMINE-Zinc Acetate (BENADRYL ITCH RELIEF STICK) 2-0.1 % STCK Apply 1 application topically in the morning, at noon, and at bedtime.     [provider]  docusate sodium (COLACE) 100 MG capsule Take 100 mg by mouth 2 (two) times daily.    [provider]  [facility] TEST test strip  04/16/21   [provider]  fluticasone (FLONASE) 50 MCG/ACT nasal spray Place into the nose. 04/17/20   [provider]  glipiZIDE (GLUCOTROL XL) 2.5 MG 24 hr tablet Take 2.5 mg by mouth daily with breakfast.     [provider]  glucose blood (EASYMAX TEST) test strip EasyMax 04/16/21   [provider]  haloperidol (HALDOL) 10 MG tablet Take by mouth. 09/27/20   [provider]  haloperidol (HALDOL) 5 MG tablet Take 7.5 mg by mouth at bedtime.  [provider]  lidocaine-prilocaine (EMLA) cream Apply 1 application topically 3 (three) times a week. Apply over dialysis shunt 30 minutes prior to dialysis.    [provider]  losartan (COZAAR) 100 MG tablet Take 1 tablet (100 mg total) by mouth daily. 04/23/15   Loletha Grayer, MD  midodrine (PROAMATINE) 2.5 MG tablet Take 5 mg by mouth. 02/03/21   [provider]  midodrine (PROAMATINE) 5 MG tablet Take by mouth. 11/04/21   [provider]  mirtazapine (REMERON) 15 MG tablet Take 15 mg by mouth at bedtime.    [provider]  Multiple Vitamins-Minerals (MULTIVITAMIN WITH  MINERALS) tablet Take 1 tablet by mouth daily.     [provider]  niacin (NIASPAN) 1000 MG CR tablet Take 1,000 mg by mouth at bedtime.     [provider]  Niacin CR 1000 MG TBCR Take by mouth. 09/11/20   [provider]  PAIN RELIEF EXTRA STRENGTH 500 MG tablet Take by mouth. 11/06/21   [provider]  pravastatin (PRAVACHOL) 40 MG tablet Take 40 mg by mouth at bedtime.     [provider]    Allergies Nsaids  Family History  Problem Relation Age of Onset   Diabetes Mother    Kidney cancer Mother    Diabetes Sister    Stroke Brother    Prostate cancer Neg Hx    Bladder Cancer Neg Hx     Social History Social History   Tobacco Use   Smoking status: Never   Smokeless tobacco: Never  Substance Use Topics   Alcohol use: No   Drug use: No    Review of Systems Constitutional: No fever/chills Eyes: No visual changes. ENT: No sore throat. Cardiovascular: Denies chest pain. Respiratory: Denies shortness of breath. Gastrointestinal: No abdominal pain.  No nausea, no vomiting.  No diarrhea.   Genitourinary: Negative for dysuria. Musculoskeletal: Negative for back pain. Skin: Negative for rash. Neurological: Negative for headaches, focal weakness or numbness.  ____________________________________________   PHYSICAL EXAM:  VITAL SIGNS: ED Triage Vitals  Enc Vitals Group     BP 11/29/21 1355 111/84     Pulse Rate 11/29/21 1355 79     Resp 11/29/21 1355 16     Temp 11/29/21 1355 97.8 F (36.6 C)     Temp Source 11/29/21 1355 Oral     SpO2 11/29/21 1355 100 %     Weight 11/29/21 1358 162 lb 4.8 oz (73.6 kg)     Height 11/29/21 1358 5\' 7"  (1.702 m)     Head Circumference --      Peak Flow --      Pain Score 11/29/21 1358 0   Constitutional: Alert and oriented.  Eyes: Conjunctivae are normal.  ENT      Head: Normocephalic and atraumatic.      Nose: No congestion/rhinnorhea.      Mouth/Throat: Mucous membranes are  moist.      Neck: No stridor. Hematological/Lymphatic/Immunilogical: No cervical lymphadenopathy. Cardiovascular: Normal rate, regular rhythm.  No murmurs, rubs, or gallops.  Respiratory: Normal respiratory effort without tachypnea nor retractions. Breath sounds are clear and equal bilaterally. No wheezes/rales/rhonchi. Gastrointestinal: Soft and non tender. No rebound. No guarding.  Genitourinary: Deferred Musculoskeletal: AV fistula in left upper arm. No warmth. No erythema.  Neurologic:  Normal speech and language. No gross focal neurologic deficits are appreciated.  Skin:  Skin is warm, dry and intact. No rash noted. Psychiatric: Mood and affect are normal. Speech and  behavior are normal. Patient exhibits appropriate insight and judgment.  ____________________________________________    LABS (pertinent positives/negatives)  CBC wbc 4.8, hgb 11.6, plt 73 CMP na 137, k 4.6, glu 98, cr 13.55  ____________________________________________   EKG  None  ____________________________________________    RADIOLOGY  None  ____________________________________________   PROCEDURES  Procedures  ____________________________________________   INITIAL IMPRESSION / ASSESSMENT AND PLAN / ED COURSE  Pertinent labs & imaging results that were available during my care of the patient were reviewed by me and considered in my medical decision making (see chart for details).   Patient presents to the emergency department today because of AV fistula malfunction and request by nephrology for admission for dialysis catheter. Patient is asymptomatic at this time for any fluid overload. Will plan on admission. ____________________________________________   FINAL CLINICAL IMPRESSION(S) / ED DIAGNOSES  Final diagnoses:  Complication of arteriovenous dialysis fistula, initial encounter     Note: This dictation was prepared with Dragon dictation. Any transcriptional errors that result from  this process are unintentional     Nance Pear, MD 11/29/21 1758

## 2021-11-30 DIAGNOSIS — T82590D Other mechanical complication of surgically created arteriovenous fistula, subsequent encounter: Secondary | ICD-10-CM

## 2021-11-30 LAB — COMPREHENSIVE METABOLIC PANEL
ALT: 12 U/L (ref 0–44)
AST: 9 U/L — ABNORMAL LOW (ref 15–41)
Albumin: 3.3 g/dL — ABNORMAL LOW (ref 3.5–5.0)
Alkaline Phosphatase: 85 U/L (ref 38–126)
Anion gap: 17 — ABNORMAL HIGH (ref 5–15)
BUN: 81 mg/dL — ABNORMAL HIGH (ref 8–23)
CO2: 25 mmol/L (ref 22–32)
Calcium: 8.4 mg/dL — ABNORMAL LOW (ref 8.9–10.3)
Chloride: 95 mmol/L — ABNORMAL LOW (ref 98–111)
Creatinine, Ser: 15.62 mg/dL — ABNORMAL HIGH (ref 0.61–1.24)
GFR, Estimated: 3 mL/min — ABNORMAL LOW (ref >60)
Glucose, Bld: 138 mg/dL — ABNORMAL HIGH (ref 70–99)
Potassium: 5.3 mmol/L — ABNORMAL HIGH (ref 3.5–5.1)
Sodium: 137 mmol/L (ref 135–145)
Total Bilirubin: 0.9 mg/dL (ref 0.3–1.2)
Total Protein: 6.6 g/dL (ref 6.5–8.1)

## 2021-11-30 LAB — DIFFERENTIAL
Abs Immature Granulocytes: 0.02 10*3/uL (ref 0.00–0.07)
Basophils Absolute: 0 10*3/uL (ref 0.0–0.1)
Basophils Relative: 0 %
Eosinophils Absolute: 0 10*3/uL (ref 0.0–0.5)
Eosinophils Relative: 1 %
Immature Granulocytes: 0 %
Lymphocytes Relative: 16 %
Lymphs Abs: 0.8 10*3/uL (ref 0.7–4.0)
Monocytes Absolute: 0.4 10*3/uL (ref 0.1–1.0)
Monocytes Relative: 7 %
Neutro Abs: 3.9 10*3/uL (ref 1.7–7.7)
Neutrophils Relative %: 76 %

## 2021-11-30 LAB — HEPATITIS B SURFACE ANTIGEN: Hepatitis B Surface Ag: NONREACTIVE

## 2021-11-30 LAB — CBC
HCT: 33.7 % — ABNORMAL LOW (ref 39.0–52.0)
Hemoglobin: 11 g/dL — ABNORMAL LOW (ref 13.0–17.0)
MCH: 32.8 pg (ref 26.0–34.0)
MCHC: 32.6 g/dL (ref 30.0–36.0)
MCV: 100.6 fL — ABNORMAL HIGH (ref 80.0–100.0)
Platelets: 80 10*3/uL — ABNORMAL LOW (ref 150–400)
RBC: 3.35 MIL/uL — ABNORMAL LOW (ref 4.22–5.81)
RDW: 15.8 % — ABNORMAL HIGH (ref 11.5–15.5)
WBC: 5.1 10*3/uL (ref 4.0–10.5)
nRBC: 0 % (ref 0.0–0.2)

## 2021-11-30 LAB — CBG MONITORING, ED
Glucose-Capillary: 168 mg/dL — ABNORMAL HIGH (ref 70–99)
Glucose-Capillary: 96 mg/dL (ref 70–99)

## 2021-11-30 LAB — GLUCOSE, CAPILLARY
Glucose-Capillary: 143 mg/dL — ABNORMAL HIGH (ref 70–99)
Glucose-Capillary: 153 mg/dL — ABNORMAL HIGH (ref 70–99)

## 2021-11-30 LAB — HIV ANTIBODY (ROUTINE TESTING W REFLEX): HIV Screen 4th Generation wRfx: NONREACTIVE

## 2021-11-30 MED ORDER — HEPARIN SODIUM (PORCINE) 5000 UNIT/ML IJ SOLN: 5000.0000 [IU] | Freq: Three times a day (TID) | INTRAMUSCULAR | Status: DC

## 2021-11-30 MED ORDER — SEVELAMER CARBONATE 800 MG PO TABS
1600.0000 mg | ORAL_TABLET | Freq: Three times a day (TID) | ORAL | Status: DC
  Administered 2021-11-30 – 2021-12-05 (×11): 1600 mg via ORAL
  Filled 2021-11-30 (×14): qty 2

## 2021-11-30 MED ORDER — HEPARIN SODIUM (PORCINE) 1000 UNIT/ML IJ SOLN
INTRAMUSCULAR | Status: AC
  Filled 2021-11-30: qty 4

## 2021-11-30 MED ORDER — CHLORHEXIDINE GLUCONATE CLOTH 2 % EX PADS
6.0000 | MEDICATED_PAD | Freq: Every day | CUTANEOUS | Status: DC
  Administered 2021-12-01 – 2021-12-05 (×5): 6 via TOPICAL
  Filled 2021-11-30: qty 6

## 2021-11-30 NOTE — ED Notes (Signed)
Pt NPO.

## 2021-11-30 NOTE — Op Note (Signed)
°  OPERATIVE NOTE   PROCEDURE: Ultrasound guidance for vascular access RIGHT Common femoral vein Placement of a 13 x 30cm dialysis catheter Right femoral vein vein  PRE-OPERATIVE DIAGNOSIS: 1. Acute renal failure 2. Malfunctioning LEFT Brachiocephalic fistula/aneurysmal  POST-OPERATIVE DIAGNOSIS: Same  SURGEON: Jamesetta So, MD  ASSISTANT(S): None  ANESTHESIA: local  ESTIMATED BLOOD LOSS: Minimal   FINDING(S): 1.  None  SPECIMEN(S):  None  INDICATIONS:    Patient is a 65 y.o.male who presents with malfunctioning LEFT AV fistula/aneurysmal.  Risks and benefits were discussed, and informed consent was obtained..  DESCRIPTION: After obtaining full informed written consent, the patient was laid flat in the bed.  The Right groin was sterilely prepped and draped in a sterile surgical field was created. The Right common femoral  vein was visualized with ultrasound and found to be widely patent. It was then accessed under direct guidance without difficulty with a Seldinger needle and a permanent image was recorded. A J-wire was then placed. After skin nick and dilatation, a 13 x 30cm dialysis catheter was placed over the wire and the wire was removed. The lumens withdrew dark red nonpulsatile blood and flushed easily with sterile saline. The catheter was secured to the skin with 3 nylon sutures. Sterile dressing was placed.  COMPLICATIONS: None  CONDITION: Stable  Lawrence [name] A 11/30/2021 10:22 AM  This note was created with Dragon Medical transcription system. Any errors in dictation are purely unintentional.

## 2021-11-30 NOTE — ED Notes (Signed)
Lawrence [name], Sister, [phone]

## 2021-11-30 NOTE — ED Notes (Signed)
IV Team at the bedside. 

## 2021-11-30 NOTE — Progress Notes (Signed)
PROGRESS NOTE    Lawrence [name]  YJE:[id number] DOB: Nov 02, 1956 DOA: 11/29/2021 PCP: Sharyne Peach, MD    Chief Complaint  Patient presents with   Vascular Access Problem    Brief Narrative:  Lawrence [name] is a 65 y.o. male sent to the emergency room by nephrologist for dysfunction of left AV fistula and inability to dialyze.  Patient receives hemodialysis on Tuesdays Thursdays and Saturdays.  Patient did go back to nephrology today to attempt dialysis and they were unable to access the catheter.  Clinically patient reports that he is stable he does not have any complaints.  Vascular surgery consulted, and he underwent right femoral temporary catheter placed.  Plan for permanent placement next week.   Assessment & Plan:   Principal Problem:   Dialysis AV fistula malfunction (HCC) Active Problems:   ESRD on dialysis ([place])   Benign hypertension with CKD (chronic kidney disease) stage IV (HCC)   Anemia in ESRD (end-stage renal disease) ([place])    AV fistula malfunction S/p right femoral temporary catheter placed Plan for permanent access next week. HD today Appreciate vascular and nephrology recommendations.   End-stage renal disease on dialysis TTS   Hypertension Continue with home medications, continue with coreg.    Anemia of chronic disease Transfuse to keep hemoglobin greater than 7   Hyperlipidemia:  Resume Pravachol.    Hyperparathyroidism:  Sevelamer with meals.    Chronic Thrombocytopenia;  Holding heparin for low platelets.     DVT prophylaxis: scd's Code Status: full code.  Family Communication: None at bedside.  Disposition:   Status is: Inpatient  Remains inpatient appropriate because: temporary HD catheter placement.        Consultants:  Nephrology Vascular surgery.   Procedures:  right femoral temp HD catheter by Vascular Antimicrobials: None.    Subjective: No new complaints.   Objective: Vitals:   11/30/21 0630  11/30/21 0730 11/30/21 0800 11/30/21 0945  BP: 110/73 109/70 118/80 117/77  Pulse: (!) 115 (!) 115 97 97  Resp:    18  Temp:    98.1 F (36.7 C)  TempSrc:      SpO2: 97% 99% 100% 98%  Weight:      Height:       No intake or output data in the 24 hours ending 11/30/21 1148 Filed Weights   11/29/21 1358  Weight: 73.6 kg    Examination:  General exam: Appears calm and comfortable  Respiratory system: Clear to auscultation. Respiratory effort normal. Cardiovascular system: S1 & S2 heard, RRR.  Gastrointestinal system: Abdomen is nondistended, soft and nontender.Normal bowel sounds heard. Central nervous system: Alert and oriented. No focal neurological deficits. Extremities: Symmetric 5 x 5 power. Skin: No rashes, lesions or ulcers Psychiatry: . Mood & affect appropriate.     Data Reviewed: I have personally reviewed following labs and imaging studies  CBC: Recent Labs  Lab 11/28/21 1528 11/29/21 1400 11/29/21 2129  WBC 5.1 4.8 4.7  NEUTROABS 2.9  --   --   HGB 11.8* 11.6* 11.9*  HCT 37.3* 36.6* 36.6*  MCV 106.0* 102.2* 100.5*  PLT 72* 73* 79*    Basic Metabolic Panel: Recent Labs  Lab 11/28/21 1528 11/29/21 1400 11/29/21 2129  NA 134* 137  --   K 4.4 4.6  --   CL 90* 95*  --   CO2 28 28  --   GLUCOSE 115* 98  --   BUN 58* 69*  --   CREATININE 11.91* 13.55* 14.03*  CALCIUM 8.3* 8.8*  --     GFR: Estimated Creatinine Clearance: 4.9 mL/min (A) (by C-G formula based on SCr of 14.03 mg/dL (H)).  Liver Function Tests: Recent Labs  Lab 11/29/21 1400  AST 11*  ALT 13  ALKPHOS 91  BILITOT 0.7  PROT 7.2  ALBUMIN 3.6    CBG: Recent Labs  Lab 11/30/21 0038 11/30/21 0752  GLUCAP 168* 96     Recent Results (from the past 240 hour(s))  Resp Panel by RT-PCR (Flu A&B, Covid) Nasopharyngeal Swab     Status: None   Collection Time: 11/29/21  6:45 PM   Specimen: Nasopharyngeal Swab; Nasopharyngeal(NP) swabs in vial transport medium  Result Value Ref  Range Status   SARS Coronavirus 2 by RT PCR NEGATIVE NEGATIVE Final    Comment: (NOTE) SARS-CoV-2 target nucleic acids are NOT DETECTED.  The SARS-CoV-2 RNA is generally detectable in upper respiratory specimens during the acute phase of infection. The lowest concentration of SARS-CoV-2 viral copies this assay can detect is 138 copies/mL. A negative result does not preclude SARS-Cov-2 infection and should not be used as the sole basis for treatment or other patient management decisions. A negative result may occur with  improper specimen collection/handling, submission of specimen other than nasopharyngeal swab, presence of viral mutation(s) within the areas targeted by this assay, and inadequate number of viral copies(<138 copies/mL). A negative result must be combined with clinical observations, patient history, and epidemiological information. The expected result is Negative.  Fact Sheet for Patients:  EntrepreneurPulse.com.au  Fact Sheet for Healthcare Providers:  IncredibleEmployment.be  This test is no t yet approved or cleared by the [place] FDA and  has been authorized for detection and/or diagnosis of SARS-CoV-2 by FDA under an Emergency Use Authorization (EUA). This EUA will remain  in effect (meaning this test can be used) for the duration of the COVID-19 declaration under Section 564(b)(1) of the Act, 21 U.S.C.section 360bbb-3(b)(1), unless the authorization is terminated  or revoked sooner.       Influenza A by PCR NEGATIVE NEGATIVE Final   Influenza B by PCR NEGATIVE NEGATIVE Final    Comment: (NOTE) The Xpert Xpress SARS-CoV-2/FLU/RSV plus assay is intended as an aid in the diagnosis of influenza from Nasopharyngeal swab specimens and should not be used as a sole basis for treatment. Nasal washings and aspirates are unacceptable for Xpert Xpress SARS-CoV-2/FLU/RSV testing.  Fact Sheet for  Patients: EntrepreneurPulse.com.au  Fact Sheet for Healthcare Providers: IncredibleEmployment.be  This test is not yet approved or cleared by the [place] FDA and has been authorized for detection and/or diagnosis of SARS-CoV-2 by FDA under an Emergency Use Authorization (EUA). This EUA will remain in effect (meaning this test can be used) for the duration of the COVID-19 declaration under Section 564(b)(1) of the Act, 21 U.S.C. section 360bbb-3(b)(1), unless the authorization is terminated or revoked.  Performed at [facility], [address], [place], [place] [address]          Radiology Studies: No results found.      Scheduled Meds:  carvedilol  25 mg Oral BID WC   Chlorhexidine Gluconate Cloth  6 each Topical Q0600   insulin aspart  0-6 Units Subcutaneous TID WC   midodrine  5 mg Oral BID WC   mirtazapine  15 mg Oral QHS   niacin  1,000 mg Oral QHS   pravastatin  40 mg Oral QHS   sevelamer carbonate  1,600 mg Oral TID WC  Continuous Infusions:   LOS: 1 day        Hosie Poisson, MD [facility]   To contact the attending provider between 7A-7P or the covering provider during after hours 7P-7A, please log into the web site [email or web address] and access using universal [place] password for that web site. If you do not have the password, please call the hospital operator.  11/30/2021, 11:48 AM

## 2021-11-30 NOTE — Plan of Care (Signed)
Admission care plan

## 2021-11-30 NOTE — Plan of Care (Signed)
  Problem: Health Behavior/Discharge Planning: Goal: Ability to manage health-related needs will improve Outcome: Progressing   

## 2021-11-30 NOTE — ED Notes (Signed)
Informed RN bed assigned 

## 2021-11-30 NOTE — H&P (View-Only) (Signed)
[facility] Vascular Consult Note  MRN : [id number]  Lawrence [name] is a 65 y.o. (01-Jun-1956) male who presents with chief complaint of  Chief Complaint  Patient presents with   Vascular Access Problem  .  History of Present Illness: Patient is admitted to the hospital with ESRD on HD via a LEFT brachiocephalic fistula and has been unable to have HD secondary to clotting. Last HD was Tuesday.  The patient is well known to our service. He has had multiple fistulograms in the last few years. When evaluated in November 2022, we recommended aneurysm resection with permacath placement at that time. The patient lives in a group home and for some reason did not follow up for recommended intervention. The nephrology service has decided to initiate dialysis at this time, and we are asked to place a temporary dialysis catheter for immediate dialysis use.    Current Facility-Administered Medications  Medication Dose Route Frequency Provider Last Rate Last Admin   acetaminophen (TYLENOL) tablet 650 mg  650 mg Oral Q6H PRN Para Skeans, MD       Or   acetaminophen (TYLENOL) suppository 650 mg  650 mg Rectal Q6H PRN Para Skeans, MD       carvedilol (COREG) tablet 25 mg  25 mg Oral BID WC Para Skeans, MD       Chlorhexidine Gluconate Cloth 2 % PADS 6 each  6 each Topical Q0600 Kolluru, Sarath, MD       insulin aspart (novoLOG) injection 0-6 Units  0-6 Units Subcutaneous TID WC Para Skeans, MD       losartan (COZAAR) tablet 100 mg  100 mg Oral Daily Florina Ou V, MD   100 mg at 11/29/21 2120   midodrine (PROAMATINE) tablet 5 mg  5 mg Oral BID WC Para Skeans, MD       mirtazapine (REMERON) tablet 15 mg  15 mg Oral QHS Florina Ou V, MD   15 mg at 11/29/21 2210   niacin (NIASPAN) CR tablet 1,000 mg  1,000 mg Oral QHS Florina Ou V, MD   1,000 mg at 11/29/21 2209   pravastatin (PRAVACHOL) tablet 40 mg  40 mg Oral QHS Para Skeans, MD   40 mg at 11/29/21 2210   Current  Outpatient Medications  Medication Sig Dispense Refill   acetaminophen (TYLENOL) 500 MG tablet Take 500-1,000 mg by mouth every 6 (six) hours as needed for mild pain or moderate pain.     aspirin EC 81 MG tablet Take 81 mg by mouth daily.      benztropine (COGENTIN) 0.5 MG tablet Take 0.5 mg by mouth 2 (two) times daily.     cetirizine (ZYRTEC) 10 MG tablet Take 5 mg by mouth at bedtime.     Coenzyme Q10 100 MG capsule Take 100 mg by mouth daily.     diphenhydrAMINE (BENADRYL) 25 mg capsule Take 25 mg by mouth See admin instructions. Take 1 capsule (25mg ) by mouth nightly as needed for sleep - may take 1 capsule (25mg ) by mouth during the daytime as needed for itching     docusate sodium (COLACE) 100 MG capsule Take 100 mg by mouth 2 (two) times daily.     fluticasone (FLONASE) 50 MCG/ACT nasal spray Place 2 sprays into both nostrils daily.     glipiZIDE (GLUCOTROL XL) 2.5 MG 24 hr tablet Take 2.5 mg by mouth daily with breakfast.      haloperidol (HALDOL) 10  MG tablet Take 15 mg by mouth at bedtime.     lidocaine-prilocaine (EMLA) cream Apply 1 application topically 3 (three) times a week. Apply over dialysis shunt 30 minutes prior to dialysis.     midodrine (PROAMATINE) 5 MG tablet Take 5 mg by mouth 3 (three) times a week. (Take 1 hour prior to dialysis)     mirtazapine (REMERON) 15 MG tablet Take 15 mg by mouth at bedtime.     Multiple Vitamins-Minerals (MULTIVITAMIN WITH MINERALS) tablet Take 1 tablet by mouth daily.      niacin (NIASPAN) 1000 MG CR tablet Take 1,000 mg by mouth at bedtime.      pravastatin (PRAVACHOL) 40 MG tablet Take 40 mg by mouth at bedtime.       Past Medical History:  Diagnosis Date   Anemia    Cerebral hemorrhage ([place]) 2012   Chronic constipation    Chronic kidney disease    Diabetes mellitus without complication ([place])    Hemodialysis patient ([place])    Hyperlipidemia    Hypertension    Schizophrenia ([place])    Stroke [facility])     Past Surgical History:   Procedure Laterality Date   A/V FISTULAGRAM Left 02/24/2017   Procedure: A/V Fistulagram;  Surgeon: Katha Cabal, MD;  Location: [facility];  Service: Cardiovascular;  Laterality: Left;   A/V FISTULAGRAM Left 09/03/2020   Procedure: A/V FISTULAGRAM;  Surgeon: Algernon Huxley, MD;  Location: [facility];  Service: Cardiovascular;  Laterality: Left;   A/V FISTULAGRAM Left 06/27/2021   Procedure: A/V FISTULAGRAM;  Surgeon: Algernon Huxley, MD;  Location: [facility];  Service: Cardiovascular;  Laterality: Left;   A/V FISTULAGRAM Left 07/24/2021   Procedure: A/V FISTULAGRAM;  Surgeon: Algernon Huxley, MD;  Location: [facility];  Service: Cardiovascular;  Laterality: Left;   PERIPHERAL VASCULAR CATHETERIZATION N/A 04/17/2015   Procedure: Dialysis/Perma Catheter Insertion;  Surgeon: Katha Cabal, MD;  Location: [facility];  Service: Cardiovascular;  Laterality: N/A;   PERIPHERAL VASCULAR CATHETERIZATION Left 07/30/2015   Procedure: A/V Shuntogram/Fistulagram;  Surgeon: Algernon Huxley, MD;  Location: [facility];  Service: Cardiovascular;  Laterality: Left;   PERIPHERAL VASCULAR CATHETERIZATION Left 07/30/2015   Procedure: A/V Shunt Intervention;  Surgeon: Algernon Huxley, MD;  Location: [facility];  Service: Cardiovascular;  Laterality: Left;   PERIPHERAL VASCULAR CATHETERIZATION N/A 08/20/2015   Procedure: DIALYSIS/PERMA CATHETER REMOVAL;  Surgeon: Algernon Huxley, MD;  Location: [facility];  Service: Cardiovascular;  Laterality: N/A;   VASCULAR SURGERY     Fistula placement    Social History Social History   Tobacco Use   Smoking status: Never   Smokeless tobacco: Never  Substance Use Topics   Alcohol use: No   Drug use: No    Family History Family History  Problem Relation Age of Onset   Diabetes Mother    Kidney cancer Mother    Diabetes Sister    Stroke Brother    Prostate cancer Neg Hx    Bladder Cancer Neg Hx      Allergies  Allergen Reactions   Nsaids Other (See Comments)    Cannot take due to renal disease     REVIEW OF SYSTEMS (Negative unless checked)  Constitutional: [] Weight loss  [] Fever  [] Chills Cardiac: [] Chest pain   [] Chest pressure   [] Palpitations   [] Shortness of breath when laying flat   [] Shortness of breath at rest   []   Shortness of breath with exertion. Vascular:  [] Pain in legs with walking   [] Pain in legs at rest   [] Pain in legs when laying flat   [] Claudication   [] Pain in feet when walking  [] Pain in feet at rest  [] Pain in feet when laying flat   [] History of DVT   [] Phlebitis   [] Swelling in legs   [] Varicose veins   [] Non-healing ulcers Pulmonary:   [] Uses home oxygen   [] Productive cough   [] Hemoptysis   [] Wheeze  [] COPD   [] Asthma Neurologic:  [] Dizziness  [] Blackouts   [] Seizures   [] History of stroke   [] History of TIA  [] Aphasia   [] Temporary blindness   [] Dysphagia   [] Weakness or numbness in arms   [] Weakness or numbness in legs Musculoskeletal:  [] Arthritis   [] Joint swelling   [] Joint pain   [] Low back pain Hematologic:  [] Easy bruising  [] Easy bleeding   [] Hypercoagulable state   [] Anemic  [] Hepatitis Gastrointestinal:  [] Blood in stool   [] Vomiting blood  [] Gastroesophageal reflux/heartburn   [] Difficulty swallowing. Genitourinary:  [] Chronic kidney disease   [] Difficult urination  [] Frequent urination  [] Burning with urination   [] Blood in urine Skin:  [] Rashes   [] Ulcers   [] Wounds Psychological:  [] History of anxiety   []  History of major depression.  Unable to obtain the review of systems due to the patient's severe systemic illness and altered mental status.       Physical Examination  Vitals:   11/30/21 0630 11/30/21 0730 11/30/21 0800 11/30/21 0945  BP: 110/73 109/70 118/80 117/77  Pulse: (!) 115 (!) 115 97 97  Resp:    18  Temp:    98.1 F (36.7 C)  TempSrc:      SpO2: 97% 99% 100% 98%  Weight:      Height:       Body mass index is  25.42 kg/m. Gen: WD/WN Head: [place]/AT, No temporalis wasting  Pulmonary:  Good air movement, clear to auscultation bilaterally.  Cardiac: RRR, normal S1, S2, no Murmurs, rubs or gallops. Vascular: NO thrill or Bruit in LEFT AV fistula, aneurysmal in two areas, skin intact, but thin, no erythema, no drainage Gastrointestinal: soft, non-tender/non-distended. No guarding/reflex.  Musculoskeletal: M/S 5/5 throughout.  Extremities without ischemic changes.  No deformity or atrophy. + Edema in the lower extremities bilaterally      CBC Lab Results  Component Value Date   WBC 4.7 11/29/2021   HGB 11.9 (L) 11/29/2021   HCT 36.6 (L) 11/29/2021   MCV 100.5 (H) 11/29/2021   PLT 79 (L) 11/29/2021    BMET    Component Value Date/Time   NA 137 11/29/2021 1400   NA 140 03/21/2015 1512   K 4.6 11/29/2021 1400   K 4.0 03/21/2015 1512   CL 95 (L) 11/29/2021 1400   CL 95 (L) 03/21/2015 1512   CO2 28 11/29/2021 1400   CO2 36 (H) 03/21/2015 1512   GLUCOSE 98 11/29/2021 1400   GLUCOSE 117 (H) 03/21/2015 1512   BUN 69 (H) 11/29/2021 1400   BUN 64 (H) 03/21/2015 1512   CREATININE 14.03 (H) 11/29/2021 2129   CREATININE 7.71 (H) 03/21/2015 1512   CALCIUM 8.8 (L) 11/29/2021 1400   CALCIUM 8.5 (L) 03/21/2015 1512   GFRNONAA 4 (L) 11/29/2021 2129   GFRNONAA 7 (L) 03/21/2015 1512   GFRAA 21 (L) 03/08/2020 1756   GFRAA 8 (L) 03/21/2015 1512   Estimated Creatinine Clearance: 4.9 mL/min (A) (by C-G formula based on SCr of  14.03 mg/dL (H)).  COAG Lab Results  Component Value Date   INR 1.1 12/13/2014    Radiology No results found.    Assessment/Plan 1. ARF/ ESRD 2. 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 Will require LEFT AV fistula aneurysm resection and revision with permacath placement next week.  Plan for placement of temporary HD catheter today.    We will proceed with temporary dialysis  catheter placement at this time.  Risks and benefits discussed with patient and/or family, and the catheter will be placed to allow immediate initiation of dialysis.  If the patient's renal function does not improve throughout the hospital course, we will be happy to place a tunneled dialysis catheter for long term use prior to discharge.     Evaristo Bury, MD  11/30/2021 10:28 AM

## 2021-11-30 NOTE — Progress Notes (Signed)
Patient completes 3-hour treatment without incident, femoral CVC functions within parameters, meeting prescribed BFR. Targeted UF reached with 1.5-liter fluid removal.  Patient offers no concerns, report given to nurse at bedside, patient transported to assigned room.

## 2021-11-30 NOTE — Consult Note (Signed)
[facility] Vascular Consult Note  MRN : [id number]  Lawrence [name] is a 65 y.o. (22-May-1956) male who presents with chief complaint of  Chief Complaint  Patient presents with   Vascular Access Problem  .  History of Present Illness: Patient is admitted to the hospital with ESRD on HD via a LEFT brachiocephalic fistula and has been unable to have HD secondary to clotting. Last HD was Tuesday.  The patient is well known to our service. He has had multiple fistulograms in the last few years. When evaluated in November 2022, we recommended aneurysm resection with permacath placement at that time. The patient lives in a group home and for some reason did not follow up for recommended intervention. The nephrology service has decided to initiate dialysis at this time, and we are asked to place a temporary dialysis catheter for immediate dialysis use.    Current Facility-Administered Medications  Medication Dose Route Frequency Provider Last Rate Last Admin   acetaminophen (TYLENOL) tablet 650 mg  650 mg Oral Q6H PRN Para Skeans, MD       Or   acetaminophen (TYLENOL) suppository 650 mg  650 mg Rectal Q6H PRN Para Skeans, MD       carvedilol (COREG) tablet 25 mg  25 mg Oral BID WC Para Skeans, MD       Chlorhexidine Gluconate Cloth 2 % PADS 6 each  6 each Topical Q0600 Kolluru, Sarath, MD       insulin aspart (novoLOG) injection 0-6 Units  0-6 Units Subcutaneous TID WC Para Skeans, MD       losartan (COZAAR) tablet 100 mg  100 mg Oral Daily Florina Ou V, MD   100 mg at 11/29/21 2120   midodrine (PROAMATINE) tablet 5 mg  5 mg Oral BID WC Para Skeans, MD       mirtazapine (REMERON) tablet 15 mg  15 mg Oral QHS Florina Ou V, MD   15 mg at 11/29/21 2210   niacin (NIASPAN) CR tablet 1,000 mg  1,000 mg Oral QHS Florina Ou V, MD   1,000 mg at 11/29/21 2209   pravastatin (PRAVACHOL) tablet 40 mg  40 mg Oral QHS Para Skeans, MD   40 mg at 11/29/21 2210   Current  Outpatient Medications  Medication Sig Dispense Refill   acetaminophen (TYLENOL) 500 MG tablet Take 500-1,000 mg by mouth every 6 (six) hours as needed for mild pain or moderate pain.     aspirin EC 81 MG tablet Take 81 mg by mouth daily.      benztropine (COGENTIN) 0.5 MG tablet Take 0.5 mg by mouth 2 (two) times daily.     cetirizine (ZYRTEC) 10 MG tablet Take 5 mg by mouth at bedtime.     Coenzyme Q10 100 MG capsule Take 100 mg by mouth daily.     diphenhydrAMINE (BENADRYL) 25 mg capsule Take 25 mg by mouth See admin instructions. Take 1 capsule (25mg ) by mouth nightly as needed for sleep - may take 1 capsule (25mg ) by mouth during the daytime as needed for itching     docusate sodium (COLACE) 100 MG capsule Take 100 mg by mouth 2 (two) times daily.     fluticasone (FLONASE) 50 MCG/ACT nasal spray Place 2 sprays into both nostrils daily.     glipiZIDE (GLUCOTROL XL) 2.5 MG 24 hr tablet Take 2.5 mg by mouth daily with breakfast.      haloperidol (HALDOL) 10  MG tablet Take 15 mg by mouth at bedtime.     lidocaine-prilocaine (EMLA) cream Apply 1 application topically 3 (three) times a week. Apply over dialysis shunt 30 minutes prior to dialysis.     midodrine (PROAMATINE) 5 MG tablet Take 5 mg by mouth 3 (three) times a week. (Take 1 hour prior to dialysis)     mirtazapine (REMERON) 15 MG tablet Take 15 mg by mouth at bedtime.     Multiple Vitamins-Minerals (MULTIVITAMIN WITH MINERALS) tablet Take 1 tablet by mouth daily.      niacin (NIASPAN) 1000 MG CR tablet Take 1,000 mg by mouth at bedtime.      pravastatin (PRAVACHOL) 40 MG tablet Take 40 mg by mouth at bedtime.       Past Medical History:  Diagnosis Date   Anemia    Cerebral hemorrhage ([place]) 2012   Chronic constipation    Chronic kidney disease    Diabetes mellitus without complication ([place])    Hemodialysis patient ([place])    Hyperlipidemia    Hypertension    Schizophrenia ([place])    Stroke [facility])     Past Surgical History:   Procedure Laterality Date   A/V FISTULAGRAM Left 02/24/2017   Procedure: A/V Fistulagram;  Surgeon: Katha Cabal, MD;  Location: [place] [facility];  Service: Cardiovascular;  Laterality: Left;   A/V FISTULAGRAM Left 09/03/2020   Procedure: A/V FISTULAGRAM;  Surgeon: Algernon Huxley, MD;  Location: [place] [facility];  Service: Cardiovascular;  Laterality: Left;   A/V FISTULAGRAM Left 06/27/2021   Procedure: A/V FISTULAGRAM;  Surgeon: Algernon Huxley, MD;  Location: [place] [facility];  Service: Cardiovascular;  Laterality: Left;   A/V FISTULAGRAM Left 07/24/2021   Procedure: A/V FISTULAGRAM;  Surgeon: Algernon Huxley, MD;  Location: [place] [facility];  Service: Cardiovascular;  Laterality: Left;   PERIPHERAL VASCULAR CATHETERIZATION N/A 04/17/2015   Procedure: Dialysis/Perma Catheter Insertion;  Surgeon: Katha Cabal, MD;  Location: [place] [facility];  Service: Cardiovascular;  Laterality: N/A;   PERIPHERAL VASCULAR CATHETERIZATION Left 07/30/2015   Procedure: A/V Shuntogram/Fistulagram;  Surgeon: Algernon Huxley, MD;  Location: [place] [facility];  Service: Cardiovascular;  Laterality: Left;   PERIPHERAL VASCULAR CATHETERIZATION Left 07/30/2015   Procedure: A/V Shunt Intervention;  Surgeon: Algernon Huxley, MD;  Location: [place] [facility];  Service: Cardiovascular;  Laterality: Left;   PERIPHERAL VASCULAR CATHETERIZATION N/A 08/20/2015   Procedure: DIALYSIS/PERMA CATHETER REMOVAL;  Surgeon: Algernon Huxley, MD;  Location: [place] [facility];  Service: Cardiovascular;  Laterality: N/A;   VASCULAR SURGERY     Fistula placement    Social History Social History   Tobacco Use   Smoking status: Never   Smokeless tobacco: Never  Substance Use Topics   Alcohol use: No   Drug use: No    Family History Family History  Problem Relation Age of Onset   Diabetes Mother    Kidney cancer Mother    Diabetes Sister    Stroke Brother    Prostate cancer Neg Hx    Bladder Cancer Neg Hx      Allergies  Allergen Reactions   Nsaids Other (See Comments)    Cannot take due to renal disease     REVIEW OF SYSTEMS (Negative unless checked)  Constitutional: [] Weight loss  [] Fever  [] Chills Cardiac: [] Chest pain   [] Chest pressure   [] Palpitations   [] Shortness of breath when laying flat   [] Shortness of breath at rest   []   Shortness of breath with exertion. Vascular:  [] Pain in legs with walking   [] Pain in legs at rest   [] Pain in legs when laying flat   [] Claudication   [] Pain in feet when walking  [] Pain in feet at rest  [] Pain in feet when laying flat   [] History of DVT   [] Phlebitis   [] Swelling in legs   [] Varicose veins   [] Non-healing ulcers Pulmonary:   [] Uses home oxygen   [] Productive cough   [] Hemoptysis   [] Wheeze  [] COPD   [] Asthma Neurologic:  [] Dizziness  [] Blackouts   [] Seizures   [] History of stroke   [] History of TIA  [] Aphasia   [] Temporary blindness   [] Dysphagia   [] Weakness or numbness in arms   [] Weakness or numbness in legs Musculoskeletal:  [] Arthritis   [] Joint swelling   [] Joint pain   [] Low back pain Hematologic:  [] Easy bruising  [] Easy bleeding   [] Hypercoagulable state   [] Anemic  [] Hepatitis Gastrointestinal:  [] Blood in stool   [] Vomiting blood  [] Gastroesophageal reflux/heartburn   [] Difficulty swallowing. Genitourinary:  [] Chronic kidney disease   [] Difficult urination  [] Frequent urination  [] Burning with urination   [] Blood in urine Skin:  [] Rashes   [] Ulcers   [] Wounds Psychological:  [] History of anxiety   []  History of major depression.  Unable to obtain the review of systems due to the patient's severe systemic illness and altered mental status.       Physical Examination  Vitals:   11/30/21 0630 11/30/21 0730 11/30/21 0800 11/30/21 0945  BP: 110/73 109/70 118/80 117/77  Pulse: (!) 115 (!) 115 97 97  Resp:    18  Temp:    98.1 F (36.7 C)  TempSrc:      SpO2: 97% 99% 100% 98%  Weight:      Height:       Body mass index is  25.42 kg/m. Gen: WD/WN Head: [place]/AT, No temporalis wasting  Pulmonary:  Good air movement, clear to auscultation bilaterally.  Cardiac: RRR, normal S1, S2, no Murmurs, rubs or gallops. Vascular: NO thrill or Bruit in LEFT AV fistula, aneurysmal in two areas, skin intact, but thin, no erythema, no drainage Gastrointestinal: soft, non-tender/non-distended. No guarding/reflex.  Musculoskeletal: M/S 5/5 throughout.  Extremities without ischemic changes.  No deformity or atrophy. + Edema in the lower extremities bilaterally      CBC Lab Results  Component Value Date   WBC 4.7 11/29/2021   HGB 11.9 (L) 11/29/2021   HCT 36.6 (L) 11/29/2021   MCV 100.5 (H) 11/29/2021   PLT 79 (L) 11/29/2021    BMET    Component Value Date/Time   NA 137 11/29/2021 1400   NA 140 03/21/2015 1512   K 4.6 11/29/2021 1400   K 4.0 03/21/2015 1512   CL 95 (L) 11/29/2021 1400   CL 95 (L) 03/21/2015 1512   CO2 28 11/29/2021 1400   CO2 36 (H) 03/21/2015 1512   GLUCOSE 98 11/29/2021 1400   GLUCOSE 117 (H) 03/21/2015 1512   BUN 69 (H) 11/29/2021 1400   BUN 64 (H) 03/21/2015 1512   CREATININE 14.03 (H) 11/29/2021 2129   CREATININE 7.71 (H) 03/21/2015 1512   CALCIUM 8.8 (L) 11/29/2021 1400   CALCIUM 8.5 (L) 03/21/2015 1512   GFRNONAA 4 (L) 11/29/2021 2129   GFRNONAA 7 (L) 03/21/2015 1512   GFRAA 21 (L) 03/08/2020 1756   GFRAA 8 (L) 03/21/2015 1512   Estimated Creatinine Clearance: 4.9 mL/min (A) (by C-G formula based on SCr of  14.03 mg/dL (H)).  COAG Lab Results  Component Value Date   INR 1.1 12/13/2014    Radiology No results found.    Assessment/Plan 1. ARF/ ESRD 2. 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 Will require LEFT AV fistula aneurysm resection and revision with permacath placement next week.  Plan for placement of temporary HD catheter today.    We will proceed with temporary dialysis  catheter placement at this time.  Risks and benefits discussed with patient and/or family, and the catheter will be placed to allow immediate initiation of dialysis.  If the patient's renal function does not improve throughout the hospital course, we will be happy to place a tunneled dialysis catheter for long term use prior to discharge.     Evaristo Bury, MD  11/30/2021 10:28 AM

## 2021-11-30 NOTE — Progress Notes (Signed)
[facility]  ROUNDING NOTE   Subjective:   [name] was admitted to [facility] on 11/29/2021 for Dialysis AV fistula malfunction [facility]) [T82.590A]  Last hemodialysis treatment was Tuesday, 12/20.   Patient has a right femoral temp catheter placed by vascular this morning.    Objective:  Vital signs in last 24 hours:  Temp:  [97.8 F (36.6 C)-98.1 F (36.7 C)] 98.1 F (36.7 C) (12/24 0945) Pulse Rate:  [79-115] 97 (12/24 0945) Resp:  [16-18] 18 (12/24 0945) BP: (107-132)/(70-95) 117/77 (12/24 0945) SpO2:  [95 %-100 %] 98 % (12/24 0945) Weight:  [73.6 kg] 73.6 kg (12/23 1358)  Weight change:  Filed Weights   11/29/21 1358  Weight: 73.6 kg    Intake/Output: No intake/output data recorded.   Intake/Output this shift:  No intake/output data recorded.  Physical Exam: General: NAD, laying in bed  Head: Normocephalic, atraumatic. Moist oral mucosal membranes  Eyes: Anicteric, PERRL  Neck: Supple, trachea midline  Lungs:  Clear to auscultation  Heart: Regular rate and rhythm  Abdomen:  Soft, nontender,   Extremities:  no peripheral edema.  Neurologic: Nonfocal, moving all four extremities  Skin: No lesions  Access: Left AVF thrombosed. Right femoral temp HD catheter 12/24 Dr. Lorenso [name]    Basic Metabolic Panel: Recent Labs  Lab 11/28/21 1528 11/29/21 1400 11/29/21 2129  NA 134* 137  --   K 4.4 4.6  --   CL 90* 95*  --   CO2 28 28  --   GLUCOSE 115* 98  --   BUN 58* 69*  --   CREATININE 11.91* 13.55* 14.03*  CALCIUM 8.3* 8.8*  --     Liver Function Tests: Recent Labs  Lab 11/29/21 1400  AST 11*  ALT 13  ALKPHOS 91  BILITOT 0.7  PROT 7.2  ALBUMIN 3.6   No results for input(s): LIPASE, AMYLASE in the last 168 hours. No results for input(s): AMMONIA in the last 168 hours.  CBC: Recent Labs  Lab 11/28/21 1528 11/29/21 1400 11/29/21 2129  WBC 5.1 4.8 4.7  NEUTROABS 2.9  --   --   HGB 11.8* 11.6* 11.9*  HCT 37.3* 36.6* 36.6*  MCV  106.0* 102.2* 100.5*  PLT 72* 73* 79*    Cardiac Enzymes: No results for input(s): CKTOTAL, CKMB, CKMBINDEX, TROPONINI in the last 168 hours.  BNP: Invalid input(s): POCBNP  CBG: Recent Labs  Lab 11/30/21 0038 11/30/21 0752  GLUCAP 168* 96    Microbiology: Results for orders placed or performed during the hospital encounter of 11/29/21  Resp Panel by RT-PCR (Flu A&B, Covid) Nasopharyngeal Swab     Status: None   Collection Time: 11/29/21  6:45 PM   Specimen: Nasopharyngeal Swab; Nasopharyngeal(NP) swabs in vial transport medium  Result Value Ref Range Status   SARS Coronavirus 2 by RT PCR NEGATIVE NEGATIVE Final    Comment: (NOTE) SARS-CoV-2 target nucleic acids are NOT DETECTED.  The SARS-CoV-2 RNA is generally detectable in upper respiratory specimens during the acute phase of infection. The lowest concentration of SARS-CoV-2 viral copies this assay can detect is 138 copies/mL. A negative result does not preclude SARS-Cov-2 infection and should not be used as the sole basis for treatment or other patient management decisions. A negative result may occur with  improper specimen collection/handling, submission of specimen other than nasopharyngeal swab, presence of viral mutation(s) within the areas targeted by this assay, and inadequate number of viral copies(<138 copies/mL). A negative result must be combined with  clinical observations, patient history, and epidemiological information. The expected result is Negative.  Fact Sheet for Patients:  EntrepreneurPulse.com.au  Fact Sheet for Healthcare Providers:  IncredibleEmployment.be  This test is no t yet approved or cleared by the [place] FDA and  has been authorized for detection and/or diagnosis of SARS-CoV-2 by FDA under an Emergency Use Authorization (EUA). This EUA will remain  in effect (meaning this test can be used) for the duration of the COVID-19 declaration under  Section 564(b)(1) of the Act, 21 U.S.C.section 360bbb-3(b)(1), unless the authorization is terminated  or revoked sooner.       Influenza A by PCR NEGATIVE NEGATIVE Final   Influenza B by PCR NEGATIVE NEGATIVE Final    Comment: (NOTE) The Xpert Xpress SARS-CoV-2/FLU/RSV plus assay is intended as an aid in the diagnosis of influenza from Nasopharyngeal swab specimens and should not be used as a sole basis for treatment. Nasal washings and aspirates are unacceptable for Xpert Xpress SARS-CoV-2/FLU/RSV testing.  Fact Sheet for Patients: EntrepreneurPulse.com.au  Fact Sheet for Healthcare Providers: IncredibleEmployment.be  This test is not yet approved or cleared by the [place] FDA and has been authorized for detection and/or diagnosis of SARS-CoV-2 by FDA under an Emergency Use Authorization (EUA). This EUA will remain in effect (meaning this test can be used) for the duration of the COVID-19 declaration under Section 564(b)(1) of the Act, 21 U.S.C. section 360bbb-3(b)(1), unless the authorization is terminated or revoked.  Performed at [facility], [address] [place], [place] [address]     Coagulation Studies: No results for input(s): LABPROT, INR in the last 72 hours.  Urinalysis: No results for input(s): COLORURINE, LABSPEC, PHURINE, GLUCOSEU, HGBUR, BILIRUBINUR, KETONESUR, PROTEINUR, UROBILINOGEN, NITRITE, LEUKOCYTESUR in the last 72 hours.  Invalid input(s): APPERANCEUR    Imaging: No results found.   Medications:     carvedilol  25 mg Oral BID WC   Chlorhexidine Gluconate Cloth  6 each Topical Q0600   insulin aspart  0-6 Units Subcutaneous TID WC   losartan  100 mg Oral Daily   midodrine  5 mg Oral BID WC   mirtazapine  15 mg Oral QHS   niacin  1,000 mg Oral QHS   pravastatin  40 mg Oral QHS   acetaminophen **OR** acetaminophen  Assessment/ Plan:  [name] is a 65 y.o. black male  with end stage renal disease on hemodialysis, hypertension, learning disability, schizophrenia, CVA, hyperlipidemia, who is admitted to [facility] on 11/29/2021 for Dialysis AV fistula malfunction ([place]) [T82.590A]  [facility] TTS [facility] Left AVF (thrombosed) 73.5kg  End stage renal disease: with complication of dialysis device. Now with right femoral temp HD catheter by Vascular. Plan on hemodialysis treatment today. Orders prepared.  - Appreciate vascular input. Permanent access for next week.   Hypertension: 117/77. Hold blood pressure agents prior to dialysis. Plan on midodrine with treatments.   Anemia of chronic kidney disease: hemoglobin 11.9, macrocytic. Mircera as outpatient.   Secondary Hyperparathyroidism: PTH, calcium and phosphorus at goal.  - sevelamer with meals.    LOS: 1 Elen Acero 12/24/202211:05 AM

## 2021-12-01 LAB — BASIC METABOLIC PANEL
Anion gap: 10 (ref 5–15)
BUN: 50 mg/dL — ABNORMAL HIGH (ref 8–23)
CO2: 27 mmol/L (ref 22–32)
Calcium: 8.5 mg/dL — ABNORMAL LOW (ref 8.9–10.3)
Chloride: 96 mmol/L — ABNORMAL LOW (ref 98–111)
Creatinine, Ser: 9.93 mg/dL — ABNORMAL HIGH (ref 0.61–1.24)
GFR, Estimated: 5 mL/min — ABNORMAL LOW (ref >60)
Glucose, Bld: 115 mg/dL — ABNORMAL HIGH (ref 70–99)
Potassium: 5.4 mmol/L — ABNORMAL HIGH (ref 3.5–5.1)
Sodium: 133 mmol/L — ABNORMAL LOW (ref 135–145)

## 2021-12-01 LAB — GLUCOSE, CAPILLARY
Glucose-Capillary: 111 mg/dL — ABNORMAL HIGH (ref 70–99)
Glucose-Capillary: 104 mg/dL — ABNORMAL HIGH (ref 70–99)
Glucose-Capillary: 112 mg/dL — ABNORMAL HIGH (ref 70–99)
Glucose-Capillary: 123 mg/dL — ABNORMAL HIGH (ref 70–99)

## 2021-12-01 LAB — HEPATITIS B SURFACE ANTIBODY,QUALITATIVE: Hep B S Ab: REACTIVE — AB

## 2021-12-01 MED ORDER — PATIROMER SORBITEX CALCIUM 8.4 G PO PACK
16.8000 g | PACK | Freq: Every day | ORAL | Status: DC
  Administered 2021-12-01 – 2021-12-03 (×3): 16.8 g via ORAL
  Filled 2021-12-01 (×5): qty 2

## 2021-12-01 NOTE — Progress Notes (Signed)
[facility]  ROUNDING NOTE   Subjective:   [name] was admitted to [facility] on 11/29/2021 for Dialysis AV fistula malfunction ([place]) [R51.884Z] Complication of arteriovenous dialysis fistula, initial encounter [T82.9XXA]  Hemodialysis treatment yesterday through left femoral temp HD catheter. Patient tolerated treatment well. UF of 1.5 liters.   Patient with no complaints this morning.    Objective:  Vital signs in last 24 hours:  Temp:  [97.8 F (36.6 C)-98.6 F (37 C)] 98.6 F (37 C) (12/25 0825) Pulse Rate:  [85-112] 96 (12/25 0800) Resp:  [16-32] 17 (12/25 0800) BP: (90-119)/(60-80) 119/74 (12/25 0800) SpO2:  [98 %-100 %] 98 % (12/25 0800) Weight:  [77.7 kg-79.2 kg] 77.7 kg (12/24 1700)  Weight change: 5.581 kg Filed Weights   11/29/21 1358 11/30/21 1334 11/30/21 1700  Weight: 73.6 kg 79.2 kg 77.7 kg    Intake/Output: I/O last 3 completed shifts: In: 100 [P.O.:100] Out: 1501 [Other:1501]   Intake/Output this shift:  No intake/output data recorded.  Physical Exam: General: NAD, laying in bed, in good mood  Head: Normocephalic, atraumatic. Moist oral mucosal membranes  Eyes: Anicteric, PERRL  Neck: Supple, trachea midline  Lungs:  Clear to auscultation  Heart: Regular rate and rhythm  Abdomen:  Soft, nontender,   Extremities: No peripheral edema.  Neurologic: Nonfocal, moving all four extremities,   Skin: No lesions  Access: Left AVF thrombosed. Right femoral temp HD catheter 12/24 Dr. Lorenso Courier    Basic Metabolic Panel: Recent Labs  Lab 11/28/21 1528 11/29/21 1400 11/29/21 2129 11/30/21 1147 12/01/21 1017  NA 134* 137  --  137 133*  K 4.4 4.6  --  5.3* 5.4*  CL 90* 95*  --  95* 96*  CO2 28 28  --  25 27  GLUCOSE 115* 98  --  138* 115*  BUN 58* 69*  --  81* 50*  CREATININE 11.91* 13.55* 14.03* 15.62* 9.93*  CALCIUM 8.3* 8.8*  --  8.4* 8.5*     Liver Function Tests: Recent Labs  Lab 11/29/21 1400 11/30/21 1147  AST 11* 9*   ALT 13 12  ALKPHOS 91 85  BILITOT 0.7 0.9  PROT 7.2 6.6  ALBUMIN 3.6 3.3*    No results for input(s): LIPASE, AMYLASE in the last 168 hours. No results for input(s): AMMONIA in the last 168 hours.  CBC: Recent Labs  Lab 11/28/21 1528 11/29/21 1400 11/29/21 2129 11/30/21 1147  WBC 5.1 4.8 4.7 5.1  NEUTROABS 2.9  --   --  3.9  HGB 11.8* 11.6* 11.9* 11.0*  HCT 37.3* 36.6* 36.6* 33.7*  MCV 106.0* 102.2* 100.5* 100.6*  PLT 72* 73* 79* 80*     Cardiac Enzymes: No results for input(s): CKTOTAL, CKMB, CKMBINDEX, TROPONINI in the last 168 hours.  BNP: Invalid input(s): POCBNP  CBG: Recent Labs  Lab 11/30/21 0752 11/30/21 1152 11/30/21 1823 12/01/21 0639 12/01/21 0801  GLUCAP 96 143* 153* 111* 104*     Microbiology: Results for orders placed or performed during the hospital encounter of 11/29/21  Resp Panel by RT-PCR (Flu A&B, Covid) Nasopharyngeal Swab     Status: None   Collection Time: 11/29/21  6:45 PM   Specimen: Nasopharyngeal Swab; Nasopharyngeal(NP) swabs in vial transport medium  Result Value Ref Range Status   SARS Coronavirus 2 by RT PCR NEGATIVE NEGATIVE Final    Comment: (NOTE) SARS-CoV-2 target nucleic acids are NOT DETECTED.  The SARS-CoV-2 RNA is generally detectable in upper respiratory specimens during the acute phase  of infection. The lowest concentration of SARS-CoV-2 viral copies this assay can detect is 138 copies/mL. A negative result does not preclude SARS-Cov-2 infection and should not be used as the sole basis for treatment or other patient management decisions. A negative result may occur with  improper specimen collection/handling, submission of specimen other than nasopharyngeal swab, presence of viral mutation(s) within the areas targeted by this assay, and inadequate number of viral copies(<138 copies/mL). A negative result must be combined with clinical observations, patient history, and epidemiological information. The expected  result is Negative.  Fact Sheet for Patients:  EntrepreneurPulse.com.au  Fact Sheet for Healthcare Providers:  IncredibleEmployment.be  This test is no t yet approved or cleared by the [place] FDA and  has been authorized for detection and/or diagnosis of SARS-CoV-2 by FDA under an Emergency Use Authorization (EUA). This EUA will remain  in effect (meaning this test can be used) for the duration of the COVID-19 declaration under Section 564(b)(1) of the Act, 21 U.S.C.section 360bbb-3(b)(1), unless the authorization is terminated  or revoked sooner.       Influenza A by PCR NEGATIVE NEGATIVE Final   Influenza B by PCR NEGATIVE NEGATIVE Final    Comment: (NOTE) The Xpert Xpress SARS-CoV-2/FLU/RSV plus assay is intended as an aid in the diagnosis of influenza from Nasopharyngeal swab specimens and should not be used as a sole basis for treatment. Nasal washings and aspirates are unacceptable for Xpert Xpress SARS-CoV-2/FLU/RSV testing.  Fact Sheet for Patients: EntrepreneurPulse.com.au  Fact Sheet for Healthcare Providers: IncredibleEmployment.be  This test is not yet approved or cleared by the [place] FDA and has been authorized for detection and/or diagnosis of SARS-CoV-2 by FDA under an Emergency Use Authorization (EUA). This EUA will remain in effect (meaning this test can be used) for the duration of the COVID-19 declaration under Section 564(b)(1) of the Act, 21 U.S.C. section 360bbb-3(b)(1), unless the authorization is terminated or revoked.  Performed at [facility], [address] [place],  [address]     Coagulation Studies: No results for input(s): LABPROT, INR in the last 72 hours.  Urinalysis: No results for input(s): COLORURINE, LABSPEC, PHURINE, GLUCOSEU, HGBUR, BILIRUBINUR, KETONESUR, PROTEINUR, UROBILINOGEN, NITRITE, LEUKOCYTESUR in the last 72  hours.  Invalid input(s): APPERANCEUR    Imaging: No results found.   Medications:     carvedilol  25 mg Oral BID WC   Chlorhexidine Gluconate Cloth  6 each Topical Q0600   insulin aspart  0-6 Units Subcutaneous TID WC   midodrine  5 mg Oral BID WC   mirtazapine  15 mg Oral QHS   niacin  1,000 mg Oral QHS   pravastatin  40 mg Oral QHS   sevelamer carbonate  1,600 mg Oral TID WC   acetaminophen **OR** acetaminophen  Assessment/ Plan:  [name] is a 65 y.o. black male with end stage renal disease on hemodialysis, hypertension, learning disability, schizophrenia, CVA, hyperlipidemia, who is admitted to [facility] on 11/29/2021 for Dialysis AV fistula malfunction ([place]) [S56.812X] Complication of arteriovenous dialysis fistula, initial encounter [T82.9XXA]  [facility] TTS [facility] Left AVF (thrombosed) 73.5kg  End stage renal disease: with complication of dialysis device. Now with right femoral temp HD catheter by Vascular. Hemodialysis treatment yesterday.   - Appreciate vascular input. Permanent access for next week.  - plan to continue TTS schedule.  - low potassium diet - start veltassa to maintain potassium levels.   Hypertension: 119/74. On carvedilol.  - midodrine with treatments.  Anemia of chronic kidney disease: hemoglobin 11, macrocytic. Mircera as outpatient.   Secondary Hyperparathyroidism: PTH, calcium and phosphorus at goal.  - sevelamer with meals.    LOS: 2 [place] Cookson 12/25/202210:44 AM

## 2021-12-01 NOTE — Progress Notes (Signed)
PROGRESS NOTE    COTTRELL GENTLES  FKC:[id number] DOB: 1956-11-26 DOA: 11/29/2021 PCP: Sharyne Peach, MD    Chief Complaint  Patient presents with   Vascular Access Problem    Brief Narrative:  MAZI BRAILSFORD is a 65 y.o. male sent to the emergency room by nephrologist for dysfunction of left AV fistula and inability to dialyze.  Patient receives hemodialysis on Tuesdays Thursdays and Saturdays.  Patient did go back to nephrology today to attempt dialysis and they were unable to access the catheter.  Clinically patient reports that he is stable he does not have any complaints.  Vascular surgery consulted, and he underwent right femoral temporary catheter placed.  Plan for permanent placement next week.   Assessment & Plan:   Principal Problem:   Dialysis AV fistula malfunction (HCC) Active Problems:   ESRD on dialysis ([place])   Benign hypertension with CKD (chronic kidney disease) stage IV (HCC)   Anemia in ESRD (end-stage renal disease) ([place])    AV fistula malfunction S/p right femoral temporary catheter placed Plan for permanent access next week. Appreciate vascular and nephrology recommendations.   End-stage renal disease on dialysis TTS Further recommendations as per nephrology.    Hypertension Optimal BP parameters. No changes in meds.  Continue with home medications, continue with coreg.    Anemia of chronic disease Transfuse to keep hemoglobin greater than 7   Hyperlipidemia:  Resume Pravachol.    Hyperparathyroidism:  Sevelamer with meals.    Chronic Thrombocytopenia;  Holding heparin for low platelets.    Hyperkalemia:  Valtessa added by nephrology.     DVT prophylaxis: scd's Code Status: full code.  Family Communication: None at bedside.  Disposition:   Status is: Inpatient  Remains inpatient appropriate because: temporary HD catheter placement. / access next week.        Consultants:  Nephrology Vascular surgery.    Procedures:  right femoral temp HD catheter by Vascular Antimicrobials: None.    Subjective: No new complaints.   Objective: Vitals:   11/30/21 2041 12/01/21 0450 12/01/21 0800 12/01/21 0825  BP: 100/78 101/60 119/74   Pulse: 96 85 96   Resp: 20 20 17    Temp: 98 F (36.7 C) 98.3 F (36.8 C)  98.6 F (37 C)  TempSrc:    Oral  SpO2: 99% 100% 98%   Weight:      Height:        Intake/Output Summary (Last 24 hours) at 12/01/2021 1452 Last data filed at 11/30/2021 2127 Gross per 24 hour  Intake 100 ml  Output 1501 ml  Net -1401 ml   Filed Weights   11/29/21 1358 11/30/21 1334 11/30/21 1700  Weight: 73.6 kg 79.2 kg 77.7 kg    Examination:  General exam: Appears calm and comfortable  Respiratory system: Clear to auscultation. Respiratory effort normal. Cardiovascular system: S1 & S2 heard, RRR. No JVD,  No pedal edema. Gastrointestinal system: Abdomen is nondistended, soft and nontender.  Normal bowel sounds heard. Central nervous system: Alert and oriented. No focal neurological deficits. Extremities: LEFT UPPER extremity fistula.  Skin: No rashes Psychiatry: Mood & affect appropriate.      Data Reviewed: I have personally reviewed following labs and imaging studies  CBC: Recent Labs  Lab 11/28/21 1528 11/29/21 1400 11/29/21 2129 11/30/21 1147  WBC 5.1 4.8 4.7 5.1  NEUTROABS 2.9  --   --  3.9  HGB 11.8* 11.6* 11.9* 11.0*  HCT 37.3* 36.6* 36.6* 33.7*  MCV 106.0* 102.2* 100.5*  100.6*  PLT 72* 73* 79* 80*     Basic Metabolic Panel: Recent Labs  Lab 11/28/21 1528 11/29/21 1400 11/29/21 2129 11/30/21 1147 12/01/21 1017  NA 134* 137  --  137 133*  K 4.4 4.6  --  5.3* 5.4*  CL 90* 95*  --  95* 96*  CO2 28 28  --  25 27  GLUCOSE 115* 98  --  138* 115*  BUN 58* 69*  --  81* 50*  CREATININE 11.91* 13.55* 14.03* 15.62* 9.93*  CALCIUM 8.3* 8.8*  --  8.4* 8.5*     GFR: Estimated Creatinine Clearance: 6.9 mL/min (A) (by C-G formula based on SCr of  9.93 mg/dL (H)).  Liver Function Tests: Recent Labs  Lab 11/29/21 1400 11/30/21 1147  AST 11* 9*  ALT 13 12  ALKPHOS 91 85  BILITOT 0.7 0.9  PROT 7.2 6.6  ALBUMIN 3.6 3.3*     CBG: Recent Labs  Lab 11/30/21 1152 11/30/21 1823 12/01/21 0639 12/01/21 0801 12/01/21 1155  GLUCAP 143* 153* 111* 104* 112*      Recent Results (from the past 240 hour(s))  Resp Panel by RT-PCR (Flu A&B, Covid) Nasopharyngeal Swab     Status: None   Collection Time: 11/29/21  6:45 PM   Specimen: Nasopharyngeal Swab; Nasopharyngeal(NP) swabs in vial transport medium  Result Value Ref Range Status   SARS Coronavirus 2 by RT PCR NEGATIVE NEGATIVE Final    Comment: (NOTE) SARS-CoV-2 target nucleic acids are NOT DETECTED.  The SARS-CoV-2 RNA is generally detectable in upper respiratory specimens during the acute phase of infection. The lowest concentration of SARS-CoV-2 viral copies this assay can detect is 138 copies/mL. A negative result does not preclude SARS-Cov-2 infection and should not be used as the sole basis for treatment or other patient management decisions. A negative result may occur with  improper specimen collection/handling, submission of specimen other than nasopharyngeal swab, presence of viral mutation(s) within the areas targeted by this assay, and inadequate number of viral copies(<138 copies/mL). A negative result must be combined with clinical observations, patient history, and epidemiological information. The expected result is Negative.  Fact Sheet for Patients:  EntrepreneurPulse.com.au  Fact Sheet for Healthcare Providers:  IncredibleEmployment.be  This test is no t yet approved or cleared by the [place] FDA and  has been authorized for detection and/or diagnosis of SARS-CoV-2 by FDA under an Emergency Use Authorization (EUA). This EUA will remain  in effect (meaning this test can be used) for the duration of  the COVID-19 declaration under Section 564(b)(1) of the Act, 21 U.S.C.section 360bbb-3(b)(1), unless the authorization is terminated  or revoked sooner.       Influenza A by PCR NEGATIVE NEGATIVE Final   Influenza B by PCR NEGATIVE NEGATIVE Final    Comment: (NOTE) The Xpert Xpress SARS-CoV-2/FLU/RSV plus assay is intended as an aid in the diagnosis of influenza from Nasopharyngeal swab specimens and should not be used as a sole basis for treatment. Nasal washings and aspirates are unacceptable for Xpert Xpress SARS-CoV-2/FLU/RSV testing.  Fact Sheet for Patients: EntrepreneurPulse.com.au  Fact Sheet for Healthcare Providers: IncredibleEmployment.be  This test is not yet approved or cleared by the [place] FDA and has been authorized for detection and/or diagnosis of SARS-CoV-2 by FDA under an Emergency Use Authorization (EUA). This EUA will remain in effect (meaning this test can be used) for the duration of the COVID-19 declaration under Section 564(b)(1) of the Act, 21 U.S.C. section 360bbb-3(b)(1), unless the  authorization is terminated or revoked.  Performed at [facility], [address], [place], [place] [address]           Radiology Studies: No results found.      Scheduled Meds:  carvedilol  25 mg Oral BID WC   Chlorhexidine Gluconate Cloth  6 each Topical Q0600   insulin aspart  0-6 Units Subcutaneous TID WC   midodrine  5 mg Oral BID WC   mirtazapine  15 mg Oral QHS   niacin  1,000 mg Oral QHS   patiromer  16.8 g Oral Daily   pravastatin  40 mg Oral QHS   sevelamer carbonate  1,600 mg Oral TID WC   Continuous Infusions:   LOS: 2 days        Hosie Poisson, MD [facility]   To contact the attending provider between 7A-7P or the covering provider during after hours 7P-7A, please log into the web site [email or web address] and access using universal [place] password for that web site. If  you do not have the password, please call the hospital operator.  12/01/2021, 2:52 PM

## 2021-12-02 LAB — BASIC METABOLIC PANEL
Anion gap: 12 (ref 5–15)
BUN: 62 mg/dL — ABNORMAL HIGH (ref 8–23)
CO2: 28 mmol/L (ref 22–32)
Calcium: 8.7 mg/dL — ABNORMAL LOW (ref 8.9–10.3)
Chloride: 96 mmol/L — ABNORMAL LOW (ref 98–111)
Creatinine, Ser: 12.61 mg/dL — ABNORMAL HIGH (ref 0.61–1.24)
GFR, Estimated: 4 mL/min — ABNORMAL LOW (ref >60)
Glucose, Bld: 139 mg/dL — ABNORMAL HIGH (ref 70–99)
Potassium: 4.7 mmol/L (ref 3.5–5.1)
Sodium: 136 mmol/L (ref 135–145)

## 2021-12-02 LAB — CBC
HCT: 32.3 % — ABNORMAL LOW (ref 39.0–52.0)
Hemoglobin: 10.4 g/dL — ABNORMAL LOW (ref 13.0–17.0)
MCH: 32.4 pg (ref 26.0–34.0)
MCHC: 32.2 g/dL (ref 30.0–36.0)
MCV: 100.6 fL — ABNORMAL HIGH (ref 80.0–100.0)
Platelets: 79 10*3/uL — ABNORMAL LOW (ref 150–400)
RBC: 3.21 MIL/uL — ABNORMAL LOW (ref 4.22–5.81)
RDW: 15.5 % (ref 11.5–15.5)
WBC: 6.1 10*3/uL (ref 4.0–10.5)
nRBC: 0 % (ref 0.0–0.2)

## 2021-12-02 LAB — GLUCOSE, CAPILLARY
Glucose-Capillary: 96 mg/dL (ref 70–99)
Glucose-Capillary: 109 mg/dL — ABNORMAL HIGH (ref 70–99)
Glucose-Capillary: 133 mg/dL — ABNORMAL HIGH (ref 70–99)

## 2021-12-02 LAB — HEPATITIS B SURFACE ANTIBODY, QUANTITATIVE: Hep B S AB Quant (Post): 42 m[IU]/mL (ref >9.9)

## 2021-12-02 MED ORDER — FLUTICASONE PROPIONATE 50 MCG/ACT NA SUSP
2.0000 | Freq: Every day | NASAL | Status: DC
  Administered 2021-12-03 – 2021-12-05 (×2): 2 via NASAL
  Filled 2021-12-02: qty 16

## 2021-12-02 MED ORDER — HEPARIN SODIUM (PORCINE) 1000 UNIT/ML IJ SOLN
INTRAMUSCULAR | Status: AC
  Filled 2021-12-02: qty 10

## 2021-12-02 MED ORDER — HALOPERIDOL 5 MG PO TABS
15.0000 mg | ORAL_TABLET | Freq: Every day | ORAL | Status: DC
  Administered 2021-12-02 – 2021-12-04 (×3): 15 mg via ORAL
  Filled 2021-12-02 (×4): qty 3

## 2021-12-02 MED ORDER — BENZTROPINE MESYLATE 0.5 MG PO TABS
0.5000 mg | ORAL_TABLET | Freq: Two times a day (BID) | ORAL | Status: DC
  Administered 2021-12-02 – 2021-12-05 (×6): 0.5 mg via ORAL
  Filled 2021-12-02 (×9): qty 1

## 2021-12-02 MED ORDER — HEPARIN SODIUM (PORCINE) 1000 UNIT/ML IJ SOLN: 1000.0000 [IU] | Freq: Once | INTRAMUSCULAR | Status: DC

## 2021-12-02 MED ORDER — HALOPERIDOL 5 MG PO TABS
15.0000 mg | ORAL_TABLET | Freq: Every day | ORAL | Status: DC
  Filled 2021-12-02: qty 3

## 2021-12-02 MED ORDER — HALOPERIDOL 5 MG PO TABS: 10.0000 mg | ORAL_TABLET | Freq: Every day | ORAL | Status: DC

## 2021-12-02 MED ORDER — MIDODRINE HCL 5 MG PO TABS
ORAL_TABLET | ORAL | Status: AC
  Filled 2021-12-02: qty 1

## 2021-12-02 MED ORDER — DOCUSATE SODIUM 100 MG PO CAPS
100.0000 mg | ORAL_CAPSULE | Freq: Two times a day (BID) | ORAL | Status: DC
  Administered 2021-12-02 – 2021-12-05 (×5): 100 mg via ORAL
  Filled 2021-12-02 (×5): qty 1

## 2021-12-02 MED ORDER — ASPIRIN EC 81 MG PO TBEC
81.0000 mg | DELAYED_RELEASE_TABLET | Freq: Every day | ORAL | Status: DC
  Administered 2021-12-03 – 2021-12-05 (×2): 81 mg via ORAL
  Filled 2021-12-02 (×2): qty 1

## 2021-12-02 NOTE — TOC Progression Note (Signed)
Transition of Care [facility]) - Progression Note    Patient Details  Name: Lawrence [name] MRN: [id number] Date of Birth: 03/23/1956  Transition of Care [facility]) CM/SW [place], RN Phone Number: 12/02/2021, 9:33 AM  Clinical Narrative:    Transition of Care [facility]) Screening Note   Patient Details  Name: Lawrence [name] Date of Birth: 1956/07/27   Transition of Care [facility]) CM/SW Contact:    Conception Oms, RN Phone Number: 12/02/2021, 9:33 AM    Transition of Care Department [facility]) has reviewed patient and no TOC needs have been identified at this time. We will continue to monitor patient advancement through interdisciplinary progression rounds. If new patient transition needs arise, please place a TOC consult.           Expected Discharge Plan and Services                                                 Social Determinants of Health (SDOH) Interventions    Readmission Risk Interventions No flowsheet data found.

## 2021-12-02 NOTE — Progress Notes (Signed)
[facility]  ROUNDING NOTE   Subjective:   [name] was admitted to [facility] on 11/29/2021 for Dialysis AV fistula malfunction ([place]) [W65.681E] Complication of arteriovenous dialysis fistula, initial encounter [T82.9XXA]  Patient with shivering. He states he does not feel sick or having a fever or chills.    Objective:  Vital signs in last 24 hours:  Temp:  [98.1 F (36.7 C)-98.2 F (36.8 C)] 98.2 F (36.8 C) (12/26 0841) Pulse Rate:  [77-95] 95 (12/26 0841) Resp:  [15-18] 15 (12/26 0841) BP: (99-119)/(72-80) 104/75 (12/26 0841) SpO2:  [94 %-97 %] 96 % (12/26 0841) Weight:  [76.8 kg] 76.8 kg (12/26 0500)  Weight change: -2.4 kg Filed Weights   11/30/21 1334 11/30/21 1700 12/02/21 0500  Weight: 79.2 kg 77.7 kg 76.8 kg    Intake/Output: I/O last 3 completed shifts: In: 100 [P.O.:100] Out: -    Intake/Output this shift:  Total I/O In: 120 [P.O.:120] Out: -   Physical Exam: General: NAD,   Head: Normocephalic, atraumatic. Moist oral mucosal membranes  Eyes: Anicteric, PERRL  Neck: Supple, trachea midline  Lungs:  Clear to auscultation  Heart: Regular rate and rhythm  Abdomen:  Soft, nontender,   Extremities: No peripheral edema.  Neurologic: +myoclonic jerking  Skin: No lesions  Access: Left AVF thrombosed. Right femoral temp HD catheter 12/24 Dr. Lorenso [name]    Basic Metabolic Panel: Recent Labs  Lab 11/28/21 1528 11/29/21 1400 11/29/21 2129 11/30/21 1147 12/01/21 1017 12/02/21 1036  NA 134* 137  --  137 133* 136  K 4.4 4.6  --  5.3* 5.4* 4.7  CL 90* 95*  --  95* 96* 96*  CO2 28 28  --  25 27 28   GLUCOSE 115* 98  --  138* 115* 139*  BUN 58* 69*  --  81* 50* 62*  CREATININE 11.91* 13.55* 14.03* 15.62* 9.93* 12.61*  CALCIUM 8.3* 8.8*  --  8.4* 8.5* 8.7*     Liver Function Tests: Recent Labs  Lab 11/29/21 1400 11/30/21 1147  AST 11* 9*  ALT 13 12  ALKPHOS 91 85  BILITOT 0.7 0.9  PROT 7.2 6.6  ALBUMIN 3.6 3.3*    No results  for input(s): LIPASE, AMYLASE in the last 168 hours. No results for input(s): AMMONIA in the last 168 hours.  CBC: Recent Labs  Lab 11/28/21 1528 11/29/21 1400 11/29/21 2129 11/30/21 1147 12/02/21 1036  WBC 5.1 4.8 4.7 5.1 6.1  NEUTROABS 2.9  --   --  3.9  --   HGB 11.8* 11.6* 11.9* 11.0* 10.4*  HCT 37.3* 36.6* 36.6* 33.7* 32.3*  MCV 106.0* 102.2* 100.5* 100.6* 100.6*  PLT 72* 73* 79* 80* 79*     Cardiac Enzymes: No results for input(s): CKTOTAL, CKMB, CKMBINDEX, TROPONINI in the last 168 hours.  BNP: Invalid input(s): POCBNP  CBG: Recent Labs  Lab 12/01/21 0801 12/01/21 1155 12/01/21 1618 12/02/21 0633 12/02/21 0827  GLUCAP 104* 112* 123* 96 109*     Microbiology: Results for orders placed or performed during the hospital encounter of 11/29/21  Resp Panel by RT-PCR (Flu A&B, Covid) Nasopharyngeal Swab     Status: None   Collection Time: 11/29/21  6:45 PM   Specimen: Nasopharyngeal Swab; Nasopharyngeal(NP) swabs in vial transport medium  Result Value Ref Range Status   SARS Coronavirus 2 by RT PCR NEGATIVE NEGATIVE Final    Comment: (NOTE) SARS-CoV-2 target nucleic acids are NOT DETECTED.  The SARS-CoV-2 RNA is generally detectable in upper respiratory  specimens during the acute phase of infection. The lowest concentration of SARS-CoV-2 viral copies this assay can detect is 138 copies/mL. A negative result does not preclude SARS-Cov-2 infection and should not be used as the sole basis for treatment or other patient management decisions. A negative result may occur with  improper specimen collection/handling, submission of specimen other than nasopharyngeal swab, presence of viral mutation(s) within the areas targeted by this assay, and inadequate number of viral copies(<138 copies/mL). A negative result must be combined with clinical observations, patient history, and epidemiological information. The expected result is Negative.  Fact Sheet for Patients:   EntrepreneurPulse.com.au  Fact Sheet for Healthcare Providers:  IncredibleEmployment.be  This test is no t yet approved or cleared by the [place] FDA and  has been authorized for detection and/or diagnosis of SARS-CoV-2 by FDA under an Emergency Use Authorization (EUA). This EUA will remain  in effect (meaning this test can be used) for the duration of the COVID-19 declaration under Section 564(b)(1) of the Act, 21 U.S.C.section 360bbb-3(b)(1), unless the authorization is terminated  or revoked sooner.       Influenza A by PCR NEGATIVE NEGATIVE Final   Influenza B by PCR NEGATIVE NEGATIVE Final    Comment: (NOTE) The Xpert Xpress SARS-CoV-2/FLU/RSV plus assay is intended as an aid in the diagnosis of influenza from Nasopharyngeal swab specimens and should not be used as a sole basis for treatment. Nasal washings and aspirates are unacceptable for Xpert Xpress SARS-CoV-2/FLU/RSV testing.  Fact Sheet for Patients: EntrepreneurPulse.com.au  Fact Sheet for Healthcare Providers: IncredibleEmployment.be  This test is not yet approved or cleared by the [place] FDA and has been authorized for detection and/or diagnosis of SARS-CoV-2 by FDA under an Emergency Use Authorization (EUA). This EUA will remain in effect (meaning this test can be used) for the duration of the COVID-19 declaration under Section 564(b)(1) of the Act, 21 U.S.C. section 360bbb-3(b)(1), unless the authorization is terminated or revoked.  Performed at [facility], [address] [place], [place] [address]     Coagulation Studies: No results for input(s): LABPROT, INR in the last 72 hours.  Urinalysis: No results for input(s): COLORURINE, LABSPEC, PHURINE, GLUCOSEU, HGBUR, BILIRUBINUR, KETONESUR, PROTEINUR, UROBILINOGEN, NITRITE, LEUKOCYTESUR in the last 72 hours.  Invalid input(s): APPERANCEUR    Imaging: No  results found.   Medications:     benztropine  0.5 mg Oral BID   carvedilol  25 mg Oral BID WC   Chlorhexidine Gluconate Cloth  6 each Topical Q0600   haloperidol  15 mg Oral QHS   insulin aspart  0-6 Units Subcutaneous TID WC   midodrine  5 mg Oral BID WC   mirtazapine  15 mg Oral QHS   niacin  1,000 mg Oral QHS   patiromer  16.8 g Oral Daily   pravastatin  40 mg Oral QHS   sevelamer carbonate  1,600 mg Oral TID WC   acetaminophen **OR** acetaminophen  Assessment/ Plan:  Lawrence [name] is a 65 y.o. black male with end stage renal disease on hemodialysis, hypertension, learning disability, schizophrenia, CVA, hyperlipidemia, who is admitted to [facility] on 11/29/2021 for Dialysis AV fistula malfunction ([place]) [I20.355H] Complication of arteriovenous dialysis fistula, initial encounter [T82.9XXA]  [facility] MWF [facility] Left AVF (thrombosed) 73.5kg  End stage renal disease: with hyperkalemia and complication of dialysis device. Now with right femoral temp HD catheter by Vascular. Hemodialysis treatment on Saturday. Patient to be transitioned to MWF this week.  -  Appreciate vascular input. Permanent access for this week.  - Dialysis for today. Orders prepared.  - low potassium diet - Continue patiromer    Hypertension: 104/75. On carvedilol.  - midodrine with treatments.   Anemia of chronic kidney disease: hemoglobin 10.4, macrocytic. Mircera as outpatient.   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 Restart both haloperidol and benztropine. If no improvement, may need neurology work up.   Secondary Hyperparathyroidism: PTH, calcium and phosphorus at goal.  - sevelamer with meals.     LOS: 3 Lawrence [name] 12/26/202211:31 AM

## 2021-12-02 NOTE — Care Management Important Message (Signed)
Important Message  Patient Details  Name: Lawrence [name] MRN: [id number] Date of Birth: [birth year]   Medicare Important Message Given:  Yes     Dannette Barbara 12/02/2021, 3:46 PM

## 2021-12-02 NOTE — Progress Notes (Signed)
PROGRESS NOTE    Lawrence [name]  TSV:[id number] DOB: 01/13/1956 DOA: 11/29/2021 PCP: Sharyne Peach, MD    Chief Complaint  Patient presents with   Vascular Access Problem    Brief Narrative:  Lawrence [name] is a 65 y.o. male sent to the emergency room by nephrologist for dysfunction of left AV fistula and inability to dialyze.  Patient receives hemodialysis on Tuesdays Thursdays and Saturdays.  Patient did go back to nephrology today to attempt dialysis and they were unable to access the catheter.  Clinically patient reports that he is stable he does not have any complaints.  Vascular surgery consulted, and he underwent right femoral temporary catheter placed.  Plan for permanent placement next week.  Pt seen and examined at bedside, reports shaking all night.   Assessment & Plan:   Principal Problem:   Dialysis AV fistula malfunction (HCC) Active Problems:   ESRD on dialysis ([place])   Benign hypertension with CKD (chronic kidney disease) stage IV (HCC)   Anemia in ESRD (end-stage renal disease) ([place])    AV fistula malfunction S/p right femoral temporary catheter placed Plan for permanent access next week. Appreciate vascular and nephrology recommendations.   End-stage renal disease on dialysis TTS Further recommendations as per nephrology.  Plan for HD today.    Myoclonic jerks:  Pt on Benztropine and haldol at home which were on hold on admission. Both the meds have been restarted.  Continue to monitor for improvement.     Hypertension Optimal BP parameters.    Anemia of chronic disease Transfuse to keep hemoglobin greater than 7 Hemoglobin between 10 to 11.  Continue to monitor.   Hyperlipidemia:  Resume Pravachol.    Hyperparathyroidism:  Sevelamer with meals.    Chronic Thrombocytopenia;  Holding heparin for low platelets.  Continue to monitor.    Hyperkalemia:  Valtessa added by nephrology.  Resolved.     DVT prophylaxis:  scd's Code Status: full code.  Family Communication: None at bedside.  Disposition:   Status is: Inpatient  Remains inpatient appropriate because: temporary HD catheter placement. / access next week.        Consultants:  Nephrology Vascular surgery.   Procedures:  right femoral temp HD catheter by Vascular Antimicrobials: None.    Subjective: Shaking lat night. No fever or chills.   Objective: Vitals:   12/01/21 1611 12/02/21 0458 12/02/21 0500 12/02/21 0841  BP: 119/80 99/72  104/75  Pulse: 77 93  95  Resp: 18 18  15   Temp: 98.1 F (36.7 C) 98.2 F (36.8 C)  98.2 F (36.8 C)  TempSrc: Oral     SpO2: 94% 97%  96%  Weight:   76.8 kg   Height:        Intake/Output Summary (Last 24 hours) at 12/02/2021 1252 Last data filed at 12/02/2021 0900 Gross per 24 hour  Intake 120 ml  Output --  Net 120 ml    Filed Weights   11/30/21 1334 11/30/21 1700 12/02/21 0500  Weight: 79.2 kg 77.7 kg 76.8 kg    Examination:  General exam: Appears calm and comfortable  Respiratory system: Clear to auscultation. Respiratory effort normal. Cardiovascular system: S1 & S2 heard, RRR. No JVD,  No pedal edema. Gastrointestinal system: Abdomen is nondistended, soft and nontender. Normal bowel sounds heard. Central nervous system: Alert and oriented. No focal neurological deficits. Myoclonic jerks of the upper extremity.  Extremities: Symmetric 5 x 5 power. Skin: No rashes, lesions or ulcers Psychiatry: Mood &  affect appropriate.       Data Reviewed: I have personally reviewed following labs and imaging studies  CBC: Recent Labs  Lab 11/28/21 1528 11/29/21 1400 11/29/21 2129 11/30/21 1147 12/02/21 1036  WBC 5.1 4.8 4.7 5.1 6.1  NEUTROABS 2.9  --   --  3.9  --   HGB 11.8* 11.6* 11.9* 11.0* 10.4*  HCT 37.3* 36.6* 36.6* 33.7* 32.3*  MCV 106.0* 102.2* 100.5* 100.6* 100.6*  PLT 72* 73* 79* 80* 79*     Basic Metabolic Panel: Recent Labs  Lab 11/28/21 1528  11/29/21 1400 11/29/21 2129 11/30/21 1147 12/01/21 1017 12/02/21 1036  NA 134* 137  --  137 133* 136  K 4.4 4.6  --  5.3* 5.4* 4.7  CL 90* 95*  --  95* 96* 96*  CO2 28 28  --  25 27 28   GLUCOSE 115* 98  --  138* 115* 139*  BUN 58* 69*  --  81* 50* 62*  CREATININE 11.91* 13.55* 14.03* 15.62* 9.93* 12.61*  CALCIUM 8.3* 8.8*  --  8.4* 8.5* 8.7*     GFR: Estimated Creatinine Clearance: 5.5 mL/min (A) (by C-G formula based on SCr of 12.61 mg/dL (H)).  Liver Function Tests: Recent Labs  Lab 11/29/21 1400 11/30/21 1147  AST 11* 9*  ALT 13 12  ALKPHOS 91 85  BILITOT 0.7 0.9  PROT 7.2 6.6  ALBUMIN 3.6 3.3*     CBG: Recent Labs  Lab 12/01/21 1155 12/01/21 1618 12/02/21 0633 12/02/21 0827 12/02/21 1206  GLUCAP 112* 123* 96 109* 133*      Recent Results (from the past 240 hour(s))  Resp Panel by RT-PCR (Flu A&B, Covid) Nasopharyngeal Swab     Status: None   Collection Time: 11/29/21  6:45 PM   Specimen: Nasopharyngeal Swab; Nasopharyngeal(NP) swabs in vial transport medium  Result Value Ref Range Status   SARS Coronavirus 2 by RT PCR NEGATIVE NEGATIVE Final    Comment: (NOTE) SARS-CoV-2 target nucleic acids are NOT DETECTED.  The SARS-CoV-2 RNA is generally detectable in upper respiratory specimens during the acute phase of infection. The lowest concentration of SARS-CoV-2 viral copies this assay can detect is 138 copies/mL. A negative result does not preclude SARS-Cov-2 infection and should not be used as the sole basis for treatment or other patient management decisions. A negative result may occur with  improper specimen collection/handling, submission of specimen other than nasopharyngeal swab, presence of viral mutation(s) within the areas targeted by this assay, and inadequate number of viral copies(<138 copies/mL). A negative result must be combined with clinical observations, patient history, and epidemiological information. The expected result is  Negative.  Fact Sheet for Patients:  EntrepreneurPulse.com.au  Fact Sheet for Healthcare Providers:  IncredibleEmployment.be  This test is no t yet approved or cleared by the [place] FDA and  has been authorized for detection and/or diagnosis of SARS-CoV-2 by FDA under an Emergency Use Authorization (EUA). This EUA will remain  in effect (meaning this test can be used) for the duration of the COVID-19 declaration under Section 564(b)(1) of the Act, 21 U.S.C.section 360bbb-3(b)(1), unless the authorization is terminated  or revoked sooner.       Influenza A by PCR NEGATIVE NEGATIVE Final   Influenza B by PCR NEGATIVE NEGATIVE Final    Comment: (NOTE) The Xpert Xpress SARS-CoV-2/FLU/RSV plus assay is intended as an aid in the diagnosis of influenza from Nasopharyngeal swab specimens and should not be used as a sole basis for treatment. Nasal  washings and aspirates are unacceptable for Xpert Xpress SARS-CoV-2/FLU/RSV testing.  Fact Sheet for Patients: EntrepreneurPulse.com.au  Fact Sheet for Healthcare Providers: IncredibleEmployment.be  This test is not yet approved or cleared by the [place] FDA and has been authorized for detection and/or diagnosis of SARS-CoV-2 by FDA under an Emergency Use Authorization (EUA). This EUA will remain in effect (meaning this test can be used) for the duration of the COVID-19 declaration under Section 564(b)(1) of the Act, 21 U.S.C. section 360bbb-3(b)(1), unless the authorization is terminated or revoked.  Performed at [facility], [address], [place], [place] [address]           Radiology Studies: No results found.      Scheduled Meds:  benztropine  0.5 mg Oral BID   carvedilol  25 mg Oral BID WC   Chlorhexidine Gluconate Cloth  6 each Topical Q0600   haloperidol  15 mg Oral QHS   insulin aspart  0-6 Units Subcutaneous TID WC    midodrine  5 mg Oral BID WC   mirtazapine  15 mg Oral QHS   niacin  1,000 mg Oral QHS   patiromer  16.8 g Oral Daily   pravastatin  40 mg Oral QHS   sevelamer carbonate  1,600 mg Oral TID WC   Continuous Infusions:   LOS: 3 days        Hosie Poisson, MD [facility]   To contact the attending provider between 7A-7P or the covering provider during after hours 7P-7A, please log into the web site [email or web address] and access using universal [place] password for that web site. If you do not have the password, please call the hospital operator.  12/02/2021, 12:52 PM

## 2021-12-03 ENCOUNTER — Encounter: Payer: Self-pay | Admitting: Vascular Surgery

## 2021-12-03 ENCOUNTER — Encounter
Admission: EM | Disposition: A | Discharge status: Skilled Nursing Facility | Payer: Self-pay | Source: Skilled Nursing Facility | Attending: Internal Medicine

## 2021-12-03 DIAGNOSIS — N185 Chronic kidney disease, stage 5: Secondary | ICD-10-CM

## 2021-12-03 HISTORY — PX: DIALYSIS/PERMA CATHETER INSERTION: CATH118288

## 2021-12-03 LAB — BASIC METABOLIC PANEL
Anion gap: 11 (ref 5–15)
BUN: 41 mg/dL — ABNORMAL HIGH (ref 8–23)
CO2: 29 mmol/L (ref 22–32)
Calcium: 8.8 mg/dL — ABNORMAL LOW (ref 8.9–10.3)
Chloride: 96 mmol/L — ABNORMAL LOW (ref 98–111)
Creatinine, Ser: 9.28 mg/dL — ABNORMAL HIGH (ref 0.61–1.24)
GFR, Estimated: 6 mL/min — ABNORMAL LOW (ref >60)
Glucose, Bld: 111 mg/dL — ABNORMAL HIGH (ref 70–99)
Potassium: 4.6 mmol/L (ref 3.5–5.1)
Sodium: 136 mmol/L (ref 135–145)

## 2021-12-03 LAB — GLUCOSE, CAPILLARY
Glucose-Capillary: 108 mg/dL — ABNORMAL HIGH (ref 70–99)
Glucose-Capillary: 110 mg/dL — ABNORMAL HIGH (ref 70–99)
Glucose-Capillary: 112 mg/dL — ABNORMAL HIGH (ref 70–99)
Glucose-Capillary: 128 mg/dL — ABNORMAL HIGH (ref 70–99)
Glucose-Capillary: 152 mg/dL — ABNORMAL HIGH (ref 70–99)
Glucose-Capillary: 160 mg/dL — ABNORMAL HIGH (ref 70–99)

## 2021-12-03 LAB — HEMOGLOBIN A1C
Hgb A1c MFr Bld: 5.5 % (ref 4.8–5.6)
Mean Plasma Glucose: 111 mg/dL

## 2021-12-03 SURGERY — DIALYSIS/PERMA CATHETER INSERTION
Anesthesia: Moderate Sedation

## 2021-12-03 MED ORDER — SODIUM CHLORIDE 0.9 % IV SOLN: INTRAVENOUS | Status: DC

## 2021-12-03 MED ORDER — FENTANYL CITRATE PF 50 MCG/ML IJ SOSY
PREFILLED_SYRINGE | INTRAMUSCULAR | Status: AC
  Filled 2021-12-03: qty 1

## 2021-12-03 MED ORDER — MIDAZOLAM HCL 2 MG/2ML IJ SOLN
INTRAMUSCULAR | Status: DC | PRN
  Administered 2021-12-03: 2 mg via INTRAVENOUS

## 2021-12-03 MED ORDER — CEFAZOLIN SODIUM-DEXTROSE 1-4 GM/50ML-% IV SOLN
INTRAVENOUS | Status: DC | PRN
  Administered 2021-12-03: 1 g via INTRAVENOUS

## 2021-12-03 MED ORDER — CEFAZOLIN SODIUM-DEXTROSE 2-4 GM/100ML-% IV SOLN
2.0000 g | INTRAVENOUS | Status: AC
  Administered 2021-12-04: 08:00:00 2 g via INTRAVENOUS

## 2021-12-03 MED ORDER — MIDAZOLAM HCL 2 MG/2ML IJ SOLN
INTRAMUSCULAR | Status: AC
  Filled 2021-12-03: qty 2

## 2021-12-03 MED ORDER — CEFAZOLIN SODIUM-DEXTROSE 1-4 GM/50ML-% IV SOLN: 1.0000 g | INTRAVENOUS | Status: DC

## 2021-12-03 MED ORDER — FENTANYL CITRATE (PF) 100 MCG/2ML IJ SOLN
INTRAMUSCULAR | Status: DC | PRN
  Administered 2021-12-03: 50 ug via INTRAVENOUS

## 2021-12-03 SURGICAL SUPPLY — 9 items
BIOPATCH RED 1 DISK 7.0 (GAUZE/BANDAGES/DRESSINGS) ×1 IMPLANT
CANNULA 5F STIFF (CANNULA) ×1 IMPLANT
CATH CANNON HEMO 15FR 19 (HEMODIALYSIS SUPPLIES) ×1 IMPLANT
COVER PROBE U/S 5X48 (MISCELLANEOUS) ×1 IMPLANT
DERMABOND ADVANCED (GAUZE/BANDAGES/DRESSINGS) ×1
DERMABOND ADVANCED .7 DNX12 (GAUZE/BANDAGES/DRESSINGS) IMPLANT
PACK ANGIOGRAPHY (CUSTOM PROCEDURE TRAY) ×1 IMPLANT
SUT MNCRL AB 4-0 PS2 18 (SUTURE) ×1 IMPLANT
SUT PROLENE 0 CT 1 30 (SUTURE) ×1 IMPLANT

## 2021-12-03 NOTE — Progress Notes (Signed)
Per MD note, explained to patient that benztropine and haldol were restarted and if no improvement with tremors, he may get an order for a neurology consult. Patient verbalized understanding.

## 2021-12-03 NOTE — Progress Notes (Signed)
PROGRESS NOTE    Lawrence [name]  ION:[id number] DOB: 1956-11-06 DOA: 11/29/2021 PCP: Sharyne Peach, MD    Chief Complaint  Patient presents with   Vascular Access Problem    Brief Narrative:  Lawrence [name] is a 65 y.o. male sent to the emergency room by nephrologist for dysfunction of left AV fistula and inability to dialyze.  Patient receives hemodialysis on Tuesdays Thursdays and Saturdays.  Patient did go back to nephrology today to attempt dialysis and they were unable to access the catheter.  Clinically patient reports that he is stable he does not have any complaints.  Vascular surgery consulted, and he underwent right femoral temporary catheter placed. He underwent placement of a 19 cm tip to cuff tunneled hemodialysis catheter via the right internal jugular vein on 12/03/21.   Assessment & Plan:   Principal Problem:   Dialysis AV fistula malfunction (HCC) Active Problems:   ESRD on dialysis ([place])   Benign hypertension with CKD (chronic kidney disease) stage IV (HCC)   Anemia in ESRD (end-stage renal disease) ([place])    AV fistula malfunction S/p right femoral temporary catheter placed on admission.  He underwent Placement of a 19 cm tip to cuff tunneled hemodialysis catheter via the right internal jugular vein today.  Appreciate vascular and nephrology recommendations.   End-stage renal disease on dialysis  Further recommendations as per nephrology.     Myoclonic jerks:  Pt on Benztropine and haldol at home which were on hold on admission. Both the meds have been restarted.  Continue to monitor for improvement.     Hypertension Well controlled BP parameters.    Anemia of chronic disease Transfuse to keep hemoglobin greater than 7 Hemoglobin between 10 to 11.  Continue to monitor.   Hyperlipidemia:  Resume Pravachol.    Hyperparathyroidism:  Sevelamer with meals.    Chronic Thrombocytopenia;  Holding heparin for low platelets.  Continue to  monitor.    Hyperkalemia:  Valtessa added by nephrology.  Resolved.     DVT prophylaxis: scd's Code Status: full code.  Family Communication: None at bedside.  Disposition:   Status is: Inpatient  Remains inpatient appropriate because: temporary HD catheter placement. / access        Consultants:  Nephrology Vascular surgery.   Procedures:  right femoral temp HD catheter by Vascular Antimicrobials: None.    Subjective: No new complaints.   Objective: Vitals:   12/03/21 1415 12/03/21 1430 12/03/21 1459 12/03/21 1519  BP: 127/84 114/74 135/87 130/80  Pulse: 89  80 98  Resp: (!) 23 17 17 18   Temp:   98 F (36.7 C) 97.7 F (36.5 C)  TempSrc:   Oral   SpO2: 97% 98% 98% 100%  Weight:      Height:        Intake/Output Summary (Last 24 hours) at 12/03/2021 1535 Last data filed at 12/03/2021 1500 Gross per 24 hour  Intake 290 ml  Output 900 ml  Net -610 ml    Filed Weights   12/02/21 1614 12/02/21 1954 12/03/21 0500  Weight: 80.9 kg 80.2 kg 79.3 kg    Examination:  General exam: Appears calm and comfortable  Respiratory system: Clear to auscultation. Respiratory effort normal. Cardiovascular system: S1 & S2 heard, RRR. No JVD,  No pedal edema. Gastrointestinal system: Abdomen is nondistended, soft and nontender. Normal bowel sounds heard. Central nervous system: Alert and oriented. No focal neurological deficits. Extremities: Symmetric 5 x 5 power. Skin: No rashes, lesions or ulcers  Psychiatry:  Mood & affect appropriate.        Data Reviewed: I have personally reviewed following labs and imaging studies  CBC: Recent Labs  Lab 11/28/21 1528 11/29/21 1400 11/29/21 2129 11/30/21 1147 12/02/21 1036  WBC 5.1 4.8 4.7 5.1 6.1  NEUTROABS 2.9  --   --  3.9  --   HGB 11.8* 11.6* 11.9* 11.0* 10.4*  HCT 37.3* 36.6* 36.6* 33.7* 32.3*  MCV 106.0* 102.2* 100.5* 100.6* 100.6*  PLT 72* 73* 79* 80* 79*     Basic Metabolic Panel: Recent Labs  Lab  11/29/21 1400 11/29/21 2129 11/30/21 1147 12/01/21 1017 12/02/21 1036 12/03/21 0319  NA 137  --  137 133* 136 136  K 4.6  --  5.3* 5.4* 4.7 4.6  CL 95*  --  95* 96* 96* 96*  CO2 28  --  25 27 28 29   GLUCOSE 98  --  138* 115* 139* 111*  BUN 69*  --  81* 50* 62* 41*  CREATININE 13.55* 14.03* 15.62* 9.93* 12.61* 9.28*  CALCIUM 8.8*  --  8.4* 8.5* 8.7* 8.8*     GFR: Estimated Creatinine Clearance: 7.4 mL/min (A) (by C-G formula based on SCr of 9.28 mg/dL (H)).  Liver Function Tests: Recent Labs  Lab 11/29/21 1400 11/30/21 1147  AST 11* 9*  ALT 13 12  ALKPHOS 91 85  BILITOT 0.7 0.9  PROT 7.2 6.6  ALBUMIN 3.6 3.3*     CBG: Recent Labs  Lab 12/02/21 2359 12/03/21 0518 12/03/21 0835 12/03/21 1207 12/03/21 1311  GLUCAP 128* 110* 108* 152* 112*      Recent Results (from the past 240 hour(s))  Resp Panel by RT-PCR (Flu A&B, Covid) Nasopharyngeal Swab     Status: None   Collection Time: 11/29/21  6:45 PM   Specimen: Nasopharyngeal Swab; Nasopharyngeal(NP) swabs in vial transport medium  Result Value Ref Range Status   SARS Coronavirus 2 by RT PCR NEGATIVE NEGATIVE Final    Comment: (NOTE) SARS-CoV-2 target nucleic acids are NOT DETECTED.  The SARS-CoV-2 RNA is generally detectable in upper respiratory specimens during the acute phase of infection. The lowest concentration of SARS-CoV-2 viral copies this assay can detect is 138 copies/mL. A negative result does not preclude SARS-Cov-2 infection and should not be used as the sole basis for treatment or other patient management decisions. A negative result may occur with  improper specimen collection/handling, submission of specimen other than nasopharyngeal swab, presence of viral mutation(s) within the areas targeted by this assay, and inadequate number of viral copies(<138 copies/mL). A negative result must be combined with clinical observations, patient history, and epidemiological information. The expected  result is Negative.  Fact Sheet for Patients:  EntrepreneurPulse.com.au  Fact Sheet for Healthcare Providers:  IncredibleEmployment.be  This test is no t yet approved or cleared by the [place] FDA and  has been authorized for detection and/or diagnosis of SARS-CoV-2 by FDA under an Emergency Use Authorization (EUA). This EUA will remain  in effect (meaning this test can be used) for the duration of the COVID-19 declaration under Section 564(b)(1) of the Act, 21 U.S.C.section 360bbb-3(b)(1), unless the authorization is terminated  or revoked sooner.       Influenza A by PCR NEGATIVE NEGATIVE Final   Influenza B by PCR NEGATIVE NEGATIVE Final    Comment: (NOTE) The Xpert Xpress SARS-CoV-2/FLU/RSV plus assay is intended as an aid in the diagnosis of influenza from Nasopharyngeal swab specimens and should not be used as a  sole basis for treatment. Nasal washings and aspirates are unacceptable for Xpert Xpress SARS-CoV-2/FLU/RSV testing.  Fact Sheet for Patients: EntrepreneurPulse.com.au  Fact Sheet for Healthcare Providers: IncredibleEmployment.be  This test is not yet approved or cleared by the [place] FDA and has been authorized for detection and/or diagnosis of SARS-CoV-2 by FDA under an Emergency Use Authorization (EUA). This EUA will remain in effect (meaning this test can be used) for the duration of the COVID-19 declaration under Section 564(b)(1) of the Act, 21 U.S.C. section 360bbb-3(b)(1), unless the authorization is terminated or revoked.  Performed at [facility], [address] [place], [place] [address]           Radiology Studies: PERIPHERAL VASCULAR CATHETERIZATION  Result Date: 12/03/2021 See surgical note for result.       Scheduled Meds:  aspirin EC  81 mg Oral Daily   benztropine  0.5 mg Oral BID   carvedilol  25 mg Oral BID WC   Chlorhexidine  Gluconate Cloth  6 each Topical Q0600   docusate sodium  100 mg Oral BID   fentaNYL       fluticasone  2 spray Each Nare Daily   haloperidol  15 mg Oral QHS   heparin sodium (porcine)  1,000 Units Intravenous Once in dialysis   insulin aspart  0-6 Units Subcutaneous TID WC   midazolam       midodrine  5 mg Oral BID WC   mirtazapine  15 mg Oral QHS   niacin  1,000 mg Oral QHS   patiromer  16.8 g Oral Daily   pravastatin  40 mg Oral QHS   sevelamer carbonate  1,600 mg Oral TID WC   Continuous Infusions:   LOS: 4 days        Hosie Poisson, MD [facility]   To contact the attending provider between 7A-7P or the covering provider during after hours 7P-7A, please log into the web site [email or web address] and access using universal [place] password for that web site. If you do not have the password, please call the hospital operator.  12/03/2021, 3:35 PM

## 2021-12-03 NOTE — Op Note (Signed)
OPERATIVE NOTE    PRE-OPERATIVE DIAGNOSIS: 1. ESRD 2. Aneursmal AVF requiring surgical revision  POST-OPERATIVE DIAGNOSIS: same as above  PROCEDURE: Ultrasound guidance for vascular access to the right internal jugular vein Fluoroscopic guidance for placement of catheter Placement of a 19 cm tip to cuff tunneled hemodialysis catheter via the right internal jugular vein  SURGEON: [name], MD  ANESTHESIA:  Local with Moderate conscious sedation for approximately 21 minutes using 2 mg of Versed and 50 mcg of Fentanyl  ESTIMATED BLOOD LOSS: 5 cc  FLUORO TIME: less than one minute  CONTRAST: none  FINDING(S): 1.  Patent right internal jugular vein  SPECIMEN(S):  None  INDICATIONS:   Lawrence [name] is a 65 y.o.male who presents with renal failure and need for revision of his AVF surgically.  The patient needs long term dialysis access for their ESRD, and a Permcath is necessary.  Risks and benefits are discussed and informed consent is obtained.    DESCRIPTION: After obtaining full informed written consent, the patient was brought back to the vascular suited. The patient's right neck and chest were sterilely prepped and draped in a sterile surgical field was created. Moderate conscious sedation was administered during a face to face encounter with the patient throughout the procedure with my supervision of the RN administering medicines and monitoring the patient's vital signs, pulse oximetry, telemetry and mental status throughout from the start of the procedure until the patient was taken to the recovery room.  The right internal jugular vein was visualized with ultrasound and found to be patent. It was then accessed under direct ultrasound guidance and a permanent image was recorded. A wire was placed. After skin nick and dilatation, the peel-away sheath was placed over the wire. I then turned my attention to an area under the clavicle. Approximately 1-2 fingerbreadths below the  clavicle a small counterincision was created and tunneled from the subclavicular incision to the access site. Using fluoroscopic guidance, a 19 centimeter tip to cuff tunneled hemodialysis catheter was selected, and tunneled from the subclavicular incision to the access site. It was then placed through the peel-away sheath and the peel-away sheath was removed. Using fluoroscopic guidance the catheter tips were parked in the right atrium. The appropriate distal connectors were placed. It withdrew blood well and flushed easily with heparinized saline and a concentrated heparin solution was then placed. It was secured to the chest wall with 2 Prolene sutures. The access incision was closed single 4-0 Monocryl. A 4-0 Monocryl pursestring suture was placed around the exit site. Sterile dressings were placed. The patient tolerated the procedure well and was taken to the recovery room in stable condition.  COMPLICATIONS: None  CONDITION: Stable  [name], MD 12/03/2021 2:04 PM   This note was created with Dragon Medical transcription system. Any errors in dictation are purely unintentional.

## 2021-12-03 NOTE — Plan of Care (Signed)

## 2021-12-03 NOTE — Progress Notes (Signed)
[facility]  ROUNDING NOTE   Subjective:   [name] was admitted to [facility] on 11/29/2021 for Dialysis AV fistula malfunction ([place]) [S06.301S] Complication of arteriovenous dialysis fistula, initial encounter [T82.9XXA]  Patient seen resting in bed, alert and oriented Currently n.p.o. for procedure Denies nausea and vomiting Denies shortness of breath Denies pain and discomfort  Dialysis treatment received yesterday, tolerated well.   Objective:  Vital signs in last 24 hours:  Temp:  [98 F (36.7 C)-99 F (37.2 C)] 98 F (36.7 C) (12/27 0845) Pulse Rate:  [53-105] 90 (12/27 0845) Resp:  [13-31] 15 (12/27 0845) BP: (102-146)/(65-126) 123/89 (12/27 0845) SpO2:  [97 %-100 %] 100 % (12/27 0845) Weight:  [79.3 kg-80.9 kg] 79.3 kg (12/27 0500)  Weight change: 4.1 kg Filed Weights   12/02/21 1614 12/02/21 1954 12/03/21 0500  Weight: 80.9 kg 80.2 kg 79.3 kg    Intake/Output: I/O last 3 completed shifts: In: 600 [P.O.:600] Out: 901 [Other:900; Stool:1]   Intake/Output this shift:  No intake/output data recorded.  Physical Exam: General: NAD, resting in bed  Head: Normocephalic, atraumatic. Moist oral mucosal membranes  Eyes: Anicteric  Lungs:  Clear to auscultation, normal effort  Heart: Regular rate and rhythm  Abdomen:  Soft, nontender  Extremities: No peripheral edema.  Neurologic: Alert, able to answer questioning  Skin: No lesions  Access: Left AVF thrombosed. Right femoral temp HD catheter 12/24 Dr. Lorenso Courier    Basic Metabolic Panel: Recent Labs  Lab 11/29/21 1400 11/29/21 2129 11/30/21 1147 12/01/21 1017 12/02/21 1036 12/03/21 0319  NA 137  --  137 133* 136 136  K 4.6  --  5.3* 5.4* 4.7 4.6  CL 95*  --  95* 96* 96* 96*  CO2 28  --  25 27 28 29   GLUCOSE 98  --  138* 115* 139* 111*  BUN 69*  --  81* 50* 62* 41*  CREATININE 13.55* 14.03* 15.62* 9.93* 12.61* 9.28*  CALCIUM 8.8*  --  8.4* 8.5* 8.7* 8.8*     Liver Function  Tests: Recent Labs  Lab 11/29/21 1400 11/30/21 1147  AST 11* 9*  ALT 13 12  ALKPHOS 91 85  BILITOT 0.7 0.9  PROT 7.2 6.6  ALBUMIN 3.6 3.3*    No results for input(s): LIPASE, AMYLASE in the last 168 hours. No results for input(s): AMMONIA in the last 168 hours.  CBC: Recent Labs  Lab 11/28/21 1528 11/29/21 1400 11/29/21 2129 11/30/21 1147 12/02/21 1036  WBC 5.1 4.8 4.7 5.1 6.1  NEUTROABS 2.9  --   --  3.9  --   HGB 11.8* 11.6* 11.9* 11.0* 10.4*  HCT 37.3* 36.6* 36.6* 33.7* 32.3*  MCV 106.0* 102.2* 100.5* 100.6* 100.6*  PLT 72* 73* 79* 80* 79*     Cardiac Enzymes: No results for input(s): CKTOTAL, CKMB, CKMBINDEX, TROPONINI in the last 168 hours.  BNP: Invalid input(s): POCBNP  CBG: Recent Labs  Lab 12/02/21 0827 12/02/21 1206 12/02/21 2359 12/03/21 0518 12/03/21 0835  GLUCAP 109* 133* 128* 110* 108*     Microbiology: Results for orders placed or performed during the hospital encounter of 11/29/21  Resp Panel by RT-PCR (Flu A&B, Covid) Nasopharyngeal Swab     Status: None   Collection Time: 11/29/21  6:45 PM   Specimen: Nasopharyngeal Swab; Nasopharyngeal(NP) swabs in vial transport medium  Result Value Ref Range Status   SARS Coronavirus 2 by RT PCR NEGATIVE NEGATIVE Final    Comment: (NOTE) SARS-CoV-2 target nucleic acids are  NOT DETECTED.  The SARS-CoV-2 RNA is generally detectable in upper respiratory specimens during the acute phase of infection. The lowest concentration of SARS-CoV-2 viral copies this assay can detect is 138 copies/mL. A negative result does not preclude SARS-Cov-2 infection and should not be used as the sole basis for treatment or other patient management decisions. A negative result may occur with  improper specimen collection/handling, submission of specimen other than nasopharyngeal swab, presence of viral mutation(s) within the areas targeted by this assay, and inadequate number of viral copies(<138 copies/mL). A  negative result must be combined with clinical observations, patient history, and epidemiological information. The expected result is Negative.  Fact Sheet for Patients:  EntrepreneurPulse.com.au  Fact Sheet for Healthcare Providers:  IncredibleEmployment.be  This test is no t yet approved or cleared by the [place] FDA and  has been authorized for detection and/or diagnosis of SARS-CoV-2 by FDA under an Emergency Use Authorization (EUA). This EUA will remain  in effect (meaning this test can be used) for the duration of the COVID-19 declaration under Section 564(b)(1) of the Act, 21 U.S.C.section 360bbb-3(b)(1), unless the authorization is terminated  or revoked sooner.       Influenza A by PCR NEGATIVE NEGATIVE Final   Influenza B by PCR NEGATIVE NEGATIVE Final    Comment: (NOTE) The Xpert Xpress SARS-CoV-2/FLU/RSV plus assay is intended as an aid in the diagnosis of influenza from Nasopharyngeal swab specimens and should not be used as a sole basis for treatment. Nasal washings and aspirates are unacceptable for Xpert Xpress SARS-CoV-2/FLU/RSV testing.  Fact Sheet for Patients: EntrepreneurPulse.com.au  Fact Sheet for Healthcare Providers: IncredibleEmployment.be  This test is not yet approved or cleared by the [place] FDA and has been authorized for detection and/or diagnosis of SARS-CoV-2 by FDA under an Emergency Use Authorization (EUA). This EUA will remain in effect (meaning this test can be used) for the duration of the COVID-19 declaration under Section 564(b)(1) of the Act, 21 U.S.C. section 360bbb-3(b)(1), unless the authorization is terminated or revoked.  Performed at [facility], [address] [place],  [address]     Coagulation Studies: No results for input(s): LABPROT, INR in the last 72 hours.  Urinalysis: No results for input(s): COLORURINE,  LABSPEC, PHURINE, GLUCOSEU, HGBUR, BILIRUBINUR, KETONESUR, PROTEINUR, UROBILINOGEN, NITRITE, LEUKOCYTESUR in the last 72 hours.  Invalid input(s): APPERANCEUR    Imaging: No results found.   Medications:     aspirin EC  81 mg Oral Daily   benztropine  0.5 mg Oral BID   carvedilol  25 mg Oral BID WC   Chlorhexidine Gluconate Cloth  6 each Topical Q0600   docusate sodium  100 mg Oral BID   fluticasone  2 spray Each Nare Daily   haloperidol  15 mg Oral QHS   heparin sodium (porcine)  1,000 Units Intravenous Once in dialysis   insulin aspart  0-6 Units Subcutaneous TID WC   midodrine  5 mg Oral BID WC   mirtazapine  15 mg Oral QHS   niacin  1,000 mg Oral QHS   patiromer  16.8 g Oral Daily   pravastatin  40 mg Oral QHS   sevelamer carbonate  1,600 mg Oral TID WC   acetaminophen **OR** acetaminophen  Assessment/ Plan:  [name] is a 65 y.o. black male with end stage renal disease on hemodialysis, hypertension, learning disability, schizophrenia, CVA, hyperlipidemia, who is admitted to [facility] on 11/29/2021 for Dialysis AV fistula malfunction ([place]) [J85.631S] Complication  of arteriovenous dialysis fistula, initial encounter [T82.9XXA]  [facility] MWF [facility] Left AVF (thrombosed) 73.5kg  End stage renal disease: with hyperkalemia and complication of dialysis device. Now with right femoral temp HD catheter by Vascular. Hemodialysis treatment on Saturday. Patient to be transitioned to MWF this week.  - Appreciate vascular input. Permanent access for this week.  -Dialysis received yesterday via temp cath.  Completed with decrease BFR of 300 due to new catheter placement. -Continue low potassium diet with fluid restriction - Continue patiromer based on potassium levels  Hypertension: 123/89 on carvedilol.  - midodrine with treatments.   Anemia of chronic kidney disease: hemoglobin 10.4, macrocytic. Mircera as outpatient.   Myoclonic jerking: extrapyramidal effects off  his bentroprine. Haloperidol and benztropine restarted.  Myoclonic jerking resolved.  Secondary Hyperparathyroidism: PTH, calcium and phosphorus at goal.  -Continue sevelamer with meals.     LOS: 4   12/27/202211:56 AM

## 2021-12-04 ENCOUNTER — Encounter
Admission: EM | Disposition: A | Discharge status: Skilled Nursing Facility | Payer: Medicare Other | Source: Skilled Nursing Facility | Attending: Internal Medicine

## 2021-12-04 ENCOUNTER — Inpatient Hospital Stay: Payer: Medicare Other | Admitting: Anesthesiology

## 2021-12-04 ENCOUNTER — Encounter: Payer: Self-pay | Admitting: Internal Medicine

## 2021-12-04 DIAGNOSIS — T82898A Other specified complication of vascular prosthetic devices, implants and grafts, initial encounter: Secondary | ICD-10-CM

## 2021-12-04 DIAGNOSIS — N185 Chronic kidney disease, stage 5: Secondary | ICD-10-CM

## 2021-12-04 HISTORY — PX: THROMBECTOMY W/ EMBOLECTOMY: SHX2507

## 2021-12-04 HISTORY — PX: REVISON OF ARTERIOVENOUS FISTULA: SHX6074

## 2021-12-04 LAB — BASIC METABOLIC PANEL
Anion gap: 12 (ref 5–15)
BUN: 64 mg/dL — ABNORMAL HIGH (ref 8–23)
CO2: 23 mmol/L (ref 22–32)
Calcium: 8.8 mg/dL — ABNORMAL LOW (ref 8.9–10.3)
Chloride: 96 mmol/L — ABNORMAL LOW (ref 98–111)
Creatinine, Ser: 11.49 mg/dL — ABNORMAL HIGH (ref 0.61–1.24)
GFR, Estimated: 4 mL/min — ABNORMAL LOW (ref >60)
Glucose, Bld: 134 mg/dL — ABNORMAL HIGH (ref 70–99)
Potassium: 4.5 mmol/L (ref 3.5–5.1)
Sodium: 131 mmol/L — ABNORMAL LOW (ref 135–145)

## 2021-12-04 LAB — GLUCOSE, CAPILLARY
Glucose-Capillary: 123 mg/dL — ABNORMAL HIGH (ref 70–99)
Glucose-Capillary: 125 mg/dL — ABNORMAL HIGH (ref 70–99)
Glucose-Capillary: 127 mg/dL — ABNORMAL HIGH (ref 70–99)
Glucose-Capillary: 139 mg/dL — ABNORMAL HIGH (ref 70–99)
Glucose-Capillary: 186 mg/dL — ABNORMAL HIGH (ref 70–99)

## 2021-12-04 SURGERY — REVISON OF ARTERIOVENOUS FISTULA
Anesthesia: General | Site: Arm Upper | Laterality: Left

## 2021-12-04 MED ORDER — MIDAZOLAM HCL 2 MG/2ML IJ SOLN
INTRAMUSCULAR | Status: DC | PRN
  Administered 2021-12-04 (×2): 1 mg via INTRAVENOUS

## 2021-12-04 MED ORDER — FENTANYL CITRATE (PF) 100 MCG/2ML IJ SOLN
INTRAMUSCULAR | Status: AC
  Filled 2021-12-04: qty 2

## 2021-12-04 MED ORDER — HEPARIN SODIUM (PORCINE) 1000 UNIT/ML IJ SOLN
INTRAMUSCULAR | Status: AC
  Filled 2021-12-04: qty 10

## 2021-12-04 MED ORDER — PHENYLEPHRINE HCL (PRESSORS) 10 MG/ML IV SOLN
INTRAVENOUS | Status: AC
  Filled 2021-12-04: qty 1

## 2021-12-04 MED ORDER — HEPARIN 5000 UNITS IN NS 1000 ML (FLUSH)
INTRAMUSCULAR | Status: DC | PRN
  Administered 2021-12-04: 501 mL via INTRAMUSCULAR

## 2021-12-04 MED ORDER — PROPOFOL 10 MG/ML IV BOLUS
INTRAVENOUS | Status: AC
  Filled 2021-12-04: qty 20

## 2021-12-04 MED ORDER — MIDODRINE HCL 5 MG PO TABS: 5.0000 mg | ORAL_TABLET | ORAL | Status: DC

## 2021-12-04 MED ORDER — MIDAZOLAM HCL 2 MG/2ML IJ SOLN
INTRAMUSCULAR | Status: AC
  Filled 2021-12-04: qty 2

## 2021-12-04 MED ORDER — HEPARIN SODIUM (PORCINE) 5000 UNIT/ML IJ SOLN
INTRAMUSCULAR | Status: AC
  Filled 2021-12-04: qty 1

## 2021-12-04 MED ORDER — LIDOCAINE HCL (CARDIAC) PF 100 MG/5ML IV SOSY
PREFILLED_SYRINGE | INTRAVENOUS | Status: DC | PRN
  Administered 2021-12-04: 80 mg via INTRAVENOUS

## 2021-12-04 MED ORDER — PROPOFOL 10 MG/ML IV BOLUS
INTRAVENOUS | Status: DC | PRN
  Administered 2021-12-04: 100 mg via INTRAVENOUS

## 2021-12-04 MED ORDER — ONDANSETRON HCL 4 MG/2ML IJ SOLN
INTRAMUSCULAR | Status: DC | PRN
  Administered 2021-12-04: 4 mg via INTRAVENOUS

## 2021-12-04 MED ORDER — DEXAMETHASONE SODIUM PHOSPHATE 10 MG/ML IJ SOLN
INTRAMUSCULAR | Status: DC | PRN
  Administered 2021-12-04: 5 mg via INTRAVENOUS

## 2021-12-04 MED ORDER — FENTANYL CITRATE (PF) 100 MCG/2ML IJ SOLN
INTRAMUSCULAR | Status: DC | PRN
  Administered 2021-12-04 (×4): 25 ug via INTRAVENOUS

## 2021-12-04 MED ORDER — BUPIVACAINE-EPINEPHRINE (PF) 0.5% -1:200000 IJ SOLN
INTRAMUSCULAR | Status: AC
  Filled 2021-12-04: qty 30

## 2021-12-04 MED ORDER — "VISTASEAL 4 ML SINGLE DOSE KIT "
PACK | CUTANEOUS | Status: DC | PRN
  Administered 2021-12-04: 4 mL via TOPICAL

## 2021-12-04 MED ORDER — SODIUM CHLORIDE 0.9 % IV SOLN
INTRAVENOUS | Status: DC | PRN
  Administered 2021-12-04: 09:00:00 120.5 mL

## 2021-12-04 MED ORDER — HEMOSTATIC AGENTS (NO CHARGE) OPTIME
TOPICAL | Status: DC | PRN
  Administered 2021-12-04: 2 via TOPICAL

## 2021-12-04 MED ORDER — PHENYLEPHRINE HCL-NACL 20-0.9 MG/250ML-% IV SOLN
INTRAVENOUS | Status: DC | PRN
  Administered 2021-12-04: 75 ug/min via INTRAVENOUS

## 2021-12-04 MED ORDER — HEPARIN SODIUM (PORCINE) 1000 UNIT/ML IJ SOLN
INTRAMUSCULAR | Status: DC | PRN
  Administered 2021-12-04: 4000 [IU] via INTRAVENOUS

## 2021-12-04 MED ORDER — CEFAZOLIN SODIUM-DEXTROSE 2-4 GM/100ML-% IV SOLN
INTRAVENOUS | Status: AC
  Filled 2021-12-04: qty 100

## 2021-12-04 MED ORDER — SODIUM CHLORIDE FLUSH 0.9 % IV SOLN
INTRAVENOUS | Status: AC
  Filled 2021-12-04: qty 10

## 2021-12-04 SURGICAL SUPPLY — 55 items
BAG DECANTER FOR FLEXI CONT (MISCELLANEOUS) ×3 IMPLANT
BLADE SURG SZ11 CARB STEEL (BLADE) ×2 IMPLANT
BOOT SUTURE AID YELLOW STND (SUTURE) ×2 IMPLANT
BRUSH SCRUB EZ  4% CHG (MISCELLANEOUS) ×1
BRUSH SCRUB EZ 4% CHG (MISCELLANEOUS) ×1 IMPLANT
CATH FOGERTY 4X80 WAS (CATHETERS) ×1 IMPLANT
CHLORAPREP W/TINT 26 (MISCELLANEOUS) ×2 IMPLANT
CLIP SPRNG 6 S-JAW DBL (CLIP) ×1 IMPLANT
CLIP SPRNG 6MM S-JAW DBL (CLIP) ×2
DERMABOND ADVANCED (GAUZE/BANDAGES/DRESSINGS) ×1
DERMABOND ADVANCED .7 DNX12 (GAUZE/BANDAGES/DRESSINGS) ×1 IMPLANT
ELECT CAUTERY BLADE 6.4 (BLADE) ×2 IMPLANT
ELECT REM PT RETURN 9FT ADLT (ELECTROSURGICAL) ×2
ELECTRODE REM PT RTRN 9FT ADLT (ELECTROSURGICAL) ×1 IMPLANT
GAUZE 4X4 16PLY ~~LOC~~+RFID DBL (SPONGE) ×2 IMPLANT
GEL ULTRASOUND 20GR AQUASONIC (MISCELLANEOUS) IMPLANT
GLOVE SURG SYN 7.0 (GLOVE) ×2 IMPLANT
GLOVE SURG SYN 7.0 PF PI (GLOVE) ×1 IMPLANT
GOWN STRL REUS W/ TWL LRG LVL3 (GOWN DISPOSABLE) ×1 IMPLANT
GOWN STRL REUS W/ TWL XL LVL3 (GOWN DISPOSABLE) ×2 IMPLANT
GOWN STRL REUS W/TWL LRG LVL3 (GOWN DISPOSABLE) ×1
GOWN STRL REUS W/TWL XL LVL3 (GOWN DISPOSABLE) ×2
GRAFT COLLAGEN VASCULAR 8X45 (Miscellaneous) ×1 IMPLANT
HEMOSTAT SURGICEL 2X3 (HEMOSTASIS) ×3 IMPLANT
IV NS 500ML (IV SOLUTION) ×2
IV NS 500ML BAXH (IV SOLUTION) ×1 IMPLANT
KIT TURNOVER KIT A (KITS) ×2 IMPLANT
LABEL OR SOLS (LABEL) ×2 IMPLANT
LOOP RED MAXI  1X406MM (MISCELLANEOUS) ×1
LOOP VESSEL MAXI 1X406 RED (MISCELLANEOUS) ×1 IMPLANT
LOOP VESSEL MINI 0.8X406 BLUE (MISCELLANEOUS) ×1 IMPLANT
LOOPS BLUE MINI 0.8X406MM (MISCELLANEOUS) ×1
MANIFOLD NEPTUNE II (INSTRUMENTS) ×2 IMPLANT
NDL FILTER BLUNT 18X1 1/2 (NEEDLE) ×1 IMPLANT
NEEDLE FILTER BLUNT 18X 1/2SAF (NEEDLE) ×1
NEEDLE FILTER BLUNT 18X1 1/2 (NEEDLE) ×1 IMPLANT
NS IRRIG 500ML POUR BTL (IV SOLUTION) ×2 IMPLANT
PACK EXTREMITY ARMC (MISCELLANEOUS) ×2 IMPLANT
PAD PREP 24X41 OB/GYN DISP (PERSONAL CARE ITEMS) ×2 IMPLANT
SOLUTION CELL SAVER (CLIP) ×1 IMPLANT
STOCKINETTE 48X4 2 PLY STRL (GAUZE/BANDAGES/DRESSINGS) ×1 IMPLANT
STOCKINETTE STRL 4IN 9604848 (GAUZE/BANDAGES/DRESSINGS) ×2 IMPLANT
SUT MNCRL AB 4-0 PS2 18 (SUTURE) ×2 IMPLANT
SUT PROLENE 6 0 BV (SUTURE) ×11 IMPLANT
SUT SILK 2 0 (SUTURE) ×1
SUT SILK 2-0 18XBRD TIE 12 (SUTURE) ×1 IMPLANT
SUT SILK 3 0 (SUTURE)
SUT SILK 3-0 18XBRD TIE 12 (SUTURE) ×1 IMPLANT
SUT SILK 4 0 (SUTURE)
SUT SILK 4-0 18XBRD TIE 12 (SUTURE) ×1 IMPLANT
SUT VIC AB 3-0 SH 27 (SUTURE) ×1
SUT VIC AB 3-0 SH 27X BRD (SUTURE) ×1 IMPLANT
SYR 20ML LL LF (SYRINGE) ×2 IMPLANT
SYR 3ML LL SCALE MARK (SYRINGE) ×2 IMPLANT
SYR TB 1ML 27GX1/2 LL (SYRINGE) IMPLANT

## 2021-12-04 NOTE — Transfer of Care (Signed)
Immediate Anesthesia Transfer of Care Note  Patient: TEVITA GOMER  Procedure(s) Performed: REVISON OF ARTERIOVENOUS FISTULA (Left: Arm Upper) THROMBECTOMY ARTERIOVENOUS FISTULA (Left: Arm Upper)  Patient Location: PACU  Anesthesia Type:General  Level of Consciousness: awake, alert  and oriented  Airway & Oxygen Therapy: Patient Spontanous Breathing and Patient connected to face mask oxygen  Post-op Assessment: Report given to RN and Post -op Vital signs reviewed and stable  Post vital signs: Reviewed and stable  Last Vitals:  Vitals Value Taken Time  BP 102/68 12/04/21 0953  Temp    Pulse 90 12/04/21 0956  Resp 20 12/04/21 0956  SpO2 100 % 12/04/21 0956  Vitals shown include unvalidated device data.  Last Pain:  Vitals:   12/04/21 0741  TempSrc:   PainSc: 0-No pain      Patients Stated Pain Goal: 0 (38/32/91 [id number])  Complications: No notable events documented.

## 2021-12-04 NOTE — Op Note (Signed)
VEIN AND VASCULAR SURGERY   OPERATIVE NOTE   PROCEDURE:  Jump graft revision as well as thrombectomy to left brachiocephalic arteriovenous fistula with 4 Fogarty embolectomy balloon and 7 mm Artegraft Ligation of aneurysmal left brachiocephalic AV fistula  PRE-OPERATIVE DIAGNOSIS: 1. 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 ESRD  POST-OPERATIVE DIAGNOSIS: same as above   SURGEON: [name], MD  ASSISTANT(S): None  ANESTHESIA: General  ESTIMATED BLOOD LOSS: 50 cc  FINDING(S): Thrombosed left brachiocephalic AV fistula with marked aneurysm degeneration.  Able to get inflow and outflow opened with thrombectomy Strong palpable thrill at end of the case  SPECIMEN(S): Left brachiocephalic AV fistula thrombus  INDICATIONS:   Lawrence [name] is a 65 y.o. male who  presents with aneurysmal degeneration of the left brachiocephalic arm arteriovenous access.  This has also recently thrombosed.  In order to salvage the fistula and decrease the bleeding complication risks, I recommended a jump graft revision with ligation of the access.  Risk, benefits, and alternatives to access surgery were discussed.  The patient is aware the risks include but are not limited to: bleeding, infection, steal syndrome, nerve damage, ischemic monomelic neuropathy, loss of the access, need for additional procedures, death and stroke.  The patient agrees to proceed forward with the procedure.  DESCRIPTION: After obtaining full informed written consent, the patient was brought back to the operating room and placed supine upon the operating table.  The patient received IV antibiotics prior to induction.  After obtaining adequate anesthesia, the patient was prepped and draped in the standard fashion for the access procedure.  As incision was created near the arterial anastomosis prior to the aneurysmal segment.  The access was encircled with vessel loops and prepared for control.  I  then created an incision in the proximal arm beyond the aneurysmal segment and encircled the access there for control with a vessel loop.  I then used the tunneller and tunnelled between the two incisions around the old access.  I brought a 7 mm Artegraft through the tunneller making sure to avoid twisting after marking for orientation.  The patient was then given 4000 units of intravenous heparin.  As the fistula was thrombosed, I began inspecting the proximal upper arm portion of the cephalic vein that would be the outflow.  It was clamped proximally and distally and transected with fresh thrombus seen.  This was removed locally and the area closer to the aneurysmal portions was oversewn with 2-0 silk suture ligatures.  To clear the outflow vein, 3 passes were made with a 4 Fogarty embolectomy balloon returning a large tail of thrombus with return of backbleeding which was vigorous.  The vein was then locally heparinized and clamped.  The Artegraft was then sewn to the cephalic vein in the proximal upper arm as the outflow vein in an end-to-end fashion with a pair of 6-0 Prolene sutures.  A single 6-0 Prolene patch suture was used for hemostasis.  I then pulled the graft to length at the area that had been dissected out that was still pulsatile.  It had the appearance of the fistula, but on transecting the vessel this was actually the brachial artery and the cephalic vein was completely thrombosed all the way to the anastomosis.  The artery was then repaired in an end-to-end fashion with a pair of 6-0 Prolene sutures with a single 6-0 Prolene patch suture used for hemostasis.  I then clamped the artery proximally and distally.  The artery was somewhat  thin-walled and somewhat aneurysmal after being in the fistula circuit.  An anterior wall arteriotomy was then created with an 11 blade and extended with Potts scissors.  The Artegraft was then cut and beveled to an appropriate length to match the arteriotomy.   Anastomosis was then created to the brachial artery with the Artegraft in an end-to-side fashion with a running 6-0 Prolene suture.  The graft was flushed and de-aired prior to release of control.  Patch sutures with 6-0 Prolene sutures were used as needed for control of bleeding.  Surgicel and Vistacel topical hemostatic agents were placed and hemostasis was complete.  I then closed the wound with 3-0 Vicryl suture in the subcutaneous space and a 4-0 Monocryl suture was used to close the skin.  Dermabond was placed as a dressing.  The patient was then awakened from anesthesia and taken to the recovery room in stable condition having tolerated the procedure well.    COMPLICATIONS: none  CONDITION: stable  [name]  12/04/2021, 9:53 AM   This note was created with Dragon Medical transcription system. Any errors in dictation are purely unintentional.

## 2021-12-04 NOTE — Progress Notes (Signed)
Patients surgical site in the [facility] area is becoming more firm to the touch. Old drainage present from arrival to PACU was marked by this nurse, now it is going outside of the markings. Other site is WNL. And no edema noted. Notified Dr. Lucky Cowboy, he is in a procedure at this time, and will be back to see the patient soon in PACU. Called vascular and secure chat sent also.

## 2021-12-04 NOTE — Progress Notes (Signed)
[facility]  ROUNDING NOTE   Subjective:   [name] was admitted to [facility] on 11/29/2021 for Dialysis AV fistula malfunction ([place]) [R15.400Q] Complication of arteriovenous dialysis fistula, initial encounter [T82.9XXA]  Patient seen and evaluated with dialysis   HEMODIALYSIS FLOWSHEET:  Blood Flow Rate (mL/min): 300 mL/min Arterial Pressure (mmHg): -110 mmHg Venous Pressure (mmHg): 80 mmHg Transmembrane Pressure (mmHg): 60 mmHg Ultrafiltration Rate (mL/min): 500 mL/min Dialysate Flow Rate (mL/min): 500 ml/min Conductivity: Machine : 13.8 Conductivity: Machine : 13.8 Dialysis Fluid Bolus: Normal Saline Bolus Amount (mL): 200 mL  No complaints of pain or discomfort at this time   Objective:  Vital signs in last 24 hours:  Temp:  [97.1 F (36.2 C)-99.4 F (37.4 C)] 97.7 F (36.5 C) (12/28 1430) Pulse Rate:  [83-120] 84 (12/28 1203) Resp:  [11-20] 20 (12/28 1430) BP: (99-130)/(68-84) 111/75 (12/28 1430) SpO2:  [97 %-100 %] 98 % (12/28 1203) Weight:  [83.6 kg] 83.6 kg (12/28 1430)  Weight change: 2.7 kg Filed Weights   12/04/21 0640 12/04/21 0741 12/04/21 1430  Weight: 83.6 kg 83.6 kg 83.6 kg    Intake/Output: I/O last 3 completed shifts: In: 290 [P.O.:240; IV Piggyback:50] Out: 900 [Other:900]   Intake/Output this shift:  Total I/O In: 300 [I.V.:200; IV Piggyback:100] Out: 5 [Blood:5]  Physical Exam: General: NAD, resting in bed  Head: Normocephalic, atraumatic. Moist oral mucosal membranes  Eyes: Anicteric  Lungs:  Clear to auscultation, normal effort  Heart: Regular rate and rhythm  Abdomen:  Soft, nontender  Extremities: No peripheral edema.  Neurologic: Alert, able to answer questioning  Skin: No lesions  Access: Left AVF thrombosed (surgical dressing). Rt IJ Permcath placed by Dew on 67/61/95    Basic Metabolic Panel: Recent Labs  Lab 11/30/21 1147 12/01/21 1017 12/02/21 1036 12/03/21 0319 12/04/21 0410  NA 137 133*  136 136 131*  K 5.3* 5.4* 4.7 4.6 4.5  CL 95* 96* 96* 96* 96*  CO2 25 27 28 29 23   GLUCOSE 138* 115* 139* 111* 134*  BUN 81* 50* 62* 41* 64*  CREATININE 15.62* 9.93* 12.61* 9.28* 11.49*  CALCIUM 8.4* 8.5* 8.7* 8.8* 8.8*     Liver Function Tests: Recent Labs  Lab 11/29/21 1400 11/30/21 1147  AST 11* 9*  ALT 13 12  ALKPHOS 91 85  BILITOT 0.7 0.9  PROT 7.2 6.6  ALBUMIN 3.6 3.3*    No results for input(s): LIPASE, AMYLASE in the last 168 hours. No results for input(s): AMMONIA in the last 168 hours.  CBC: Recent Labs  Lab 11/28/21 1528 11/29/21 1400 11/29/21 2129 11/30/21 1147 12/02/21 1036  WBC 5.1 4.8 4.7 5.1 6.1  NEUTROABS 2.9  --   --  3.9  --   HGB 11.8* 11.6* 11.9* 11.0* 10.4*  HCT 37.3* 36.6* 36.6* 33.7* 32.3*  MCV 106.0* 102.2* 100.5* 100.6* 100.6*  PLT 72* 73* 79* 80* 79*     Cardiac Enzymes: No results for input(s): CKTOTAL, CKMB, CKMBINDEX, TROPONINI in the last 168 hours.  BNP: Invalid input(s): POCBNP  CBG: Recent Labs  Lab 12/03/21 1732 12/04/21 0015 12/04/21 0612 12/04/21 0958 12/04/21 1205  GLUCAP 160* 139* 123* 127* 125*     Microbiology: Results for orders placed or performed during the hospital encounter of 11/29/21  Resp Panel by RT-PCR (Flu A&B, Covid) Nasopharyngeal Swab     Status: None   Collection Time: 11/29/21  6:45 PM   Specimen: Nasopharyngeal Swab; Nasopharyngeal(NP) swabs in vial transport medium  Result Value  Ref Range Status   SARS Coronavirus 2 by RT PCR NEGATIVE NEGATIVE Final    Comment: (NOTE) SARS-CoV-2 target nucleic acids are NOT DETECTED.  The SARS-CoV-2 RNA is generally detectable in upper respiratory specimens during the acute phase of infection. The lowest concentration of SARS-CoV-2 viral copies this assay can detect is 138 copies/mL. A negative result does not preclude SARS-Cov-2 infection and should not be used as the sole basis for treatment or other patient management decisions. A negative result  may occur with  improper specimen collection/handling, submission of specimen other than nasopharyngeal swab, presence of viral mutation(s) within the areas targeted by this assay, and inadequate number of viral copies(<138 copies/mL). A negative result must be combined with clinical observations, patient history, and epidemiological information. The expected result is Negative.  Fact Sheet for Patients:  EntrepreneurPulse.com.au  Fact Sheet for Healthcare Providers:  IncredibleEmployment.be  This test is no t yet approved or cleared by the [place] FDA and  has been authorized for detection and/or diagnosis of SARS-CoV-2 by FDA under an Emergency Use Authorization (EUA). This EUA will remain  in effect (meaning this test can be used) for the duration of the COVID-19 declaration under Section 564(b)(1) of the Act, 21 U.S.C.section 360bbb-3(b)(1), unless the authorization is terminated  or revoked sooner.       Influenza A by PCR NEGATIVE NEGATIVE Final   Influenza B by PCR NEGATIVE NEGATIVE Final    Comment: (NOTE) The Xpert Xpress SARS-CoV-2/FLU/RSV plus assay is intended as an aid in the diagnosis of influenza from Nasopharyngeal swab specimens and should not be used as a sole basis for treatment. Nasal washings and aspirates are unacceptable for Xpert Xpress SARS-CoV-2/FLU/RSV testing.  Fact Sheet for Patients: EntrepreneurPulse.com.au  Fact Sheet for Healthcare Providers: IncredibleEmployment.be  This test is not yet approved or cleared by the [place] FDA and has been authorized for detection and/or diagnosis of SARS-CoV-2 by FDA under an Emergency Use Authorization (EUA). This EUA will remain in effect (meaning this test can be used) for the duration of the COVID-19 declaration under Section 564(b)(1) of the Act, 21 U.S.C. section 360bbb-3(b)(1), unless the authorization is terminated  or revoked.  Performed at [facility], [address] [place], [place] [address]     Coagulation Studies: No results for input(s): LABPROT, INR in the last 72 hours.  Urinalysis: No results for input(s): COLORURINE, LABSPEC, PHURINE, GLUCOSEU, HGBUR, BILIRUBINUR, KETONESUR, PROTEINUR, UROBILINOGEN, NITRITE, LEUKOCYTESUR in the last 72 hours.  Invalid input(s): APPERANCEUR    Imaging: PERIPHERAL VASCULAR CATHETERIZATION  Result Date: 12/03/2021 See surgical note for result.    Medications:     aspirin EC  81 mg Oral Daily   benztropine  0.5 mg Oral BID   carvedilol  25 mg Oral BID WC   Chlorhexidine Gluconate Cloth  6 each Topical Q0600   docusate sodium  100 mg Oral BID   fluticasone  2 spray Each Nare Daily   haloperidol  15 mg Oral QHS   heparin sodium (porcine)  1,000 Units Intravenous Once in dialysis   insulin aspart  0-6 Units Subcutaneous TID WC   midodrine  5 mg Oral Q M,W,F-HD   mirtazapine  15 mg Oral QHS   niacin  1,000 mg Oral QHS   pravastatin  40 mg Oral QHS   sevelamer carbonate  1,600 mg Oral TID WC   acetaminophen **OR** acetaminophen  Assessment/ Plan:  [name] is a 65 y.o. black male with end  stage renal disease on hemodialysis, hypertension, learning disability, schizophrenia, CVA, hyperlipidemia, who is admitted to [facility] on 11/29/2021 for Dialysis AV fistula malfunction ([place]) [J68.115B] Complication of arteriovenous dialysis fistula, initial encounter [T82.9XXA]  [facility] MWF [facility] Left AVF (thrombosed) 73.5kg  End stage renal disease: with hyperkalemia and complication of dialysis device. Now with right femoral temp HD catheter by Vascular. Hemodialysis treatment on Saturday. Patient to be transitioned to MWF this week.  - Appreciate vascular input. Permanent access for this week.  -Receiving dialysis via Rt IJ Permcath, tolerating well -Vascular performed graft revision today along with thrombectomy.  Successful procedure with bruit and thrill at end of procedure.  -Will allow graft to rest for 4 weeks.  - Next treatment scheduled for Friday  Hypertension: 107/68 during dialysis on carvedilol.  - Continue midodrine with treatments.   Anemia of chronic kidney disease: hemoglobin 10.4, macrocytic. Mircera as outpatient.   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 Haloperidol and benztropine restarted.  Not reported today  Secondary Hyperparathyroidism: PTH, calcium and phosphorus at goal.  -Continue sevelamer with meals.     LOS: 5   12/28/20223:03 PM

## 2021-12-04 NOTE — Anesthesia Procedure Notes (Signed)
Procedure Name: LMA Insertion Date/Time: 12/04/2021 9:58 AM Performed by: Nelda Marseille, CRNA Pre-anesthesia Checklist: Patient identified, Patient being monitored, Timeout performed, Emergency Drugs available and Suction available Patient Re-evaluated:Patient Re-evaluated prior to induction Oxygen Delivery Method: Circle system utilized Preoxygenation: Pre-oxygenation with 100% oxygen Induction Type: IV induction Ventilation: Mask ventilation without difficulty LMA: LMA inserted LMA Size: 4.0 Tube type: Oral Number of attempts: 1 Placement Confirmation: positive ETCO2 and breath sounds checked- equal and bilateral Tube secured with: Tape Dental Injury: Teeth and Oropharynx as per pre-operative assessment

## 2021-12-04 NOTE — Interval H&P Note (Signed)
History and Physical Interval Note:  12/04/2021 7:19 AM  Lawrence [name]  has presented today for surgery, with the diagnosis of ESRD, aneurysmal AVF.  The various methods of treatment have been discussed with the patient and family. After consideration of risks, benefits and other options for treatment, the patient has consented to  Procedure(s): REVISON OF ARTERIOVENOUS FISTULA (Left) as a surgical intervention.  The patient's history has been reviewed, patient examined, no change in status, stable for surgery.  I have reviewed the patient's chart and labs.  Questions were answered to the patient's satisfaction.     [name]

## 2021-12-04 NOTE — Anesthesia Preprocedure Evaluation (Signed)
Anesthesia Evaluation  Patient identified by MRN, date of birth, ID band Patient awake    Reviewed: Allergy & Precautions, NPO status , Patient's Chart, lab work & pertinent test results  History of Anesthesia Complications Negative for: history of anesthetic complications  Airway Mallampati: III   Neck ROM: Full    Dental  (+) Lower Dentures, Upper Dentures   Pulmonary neg pulmonary ROS,    Pulmonary exam normal breath sounds clear to auscultation       Cardiovascular hypertension, Normal cardiovascular exam Rhythm:Regular Rate:Normal  ECG 11/29/21: normal   Neuro/Psych PSYCHIATRIC DISORDERS Schizophrenia CVA, No Residual Symptoms    GI/Hepatic negative GI ROS,   Endo/Other  diabetes, Type 2  Renal/GU Renal disease (ESRD on HD, last HD 12/03/21)     Musculoskeletal   Abdominal   Peds  Hematology  (+) Blood dyscrasia (chronic thrombocytopenia), anemia ,   Anesthesia Other Findings   Reproductive/Obstetrics                             Anesthesia Physical Anesthesia Plan  ASA: 4  Anesthesia Plan: General   Post-op Pain Management:    Induction: Intravenous  PONV Risk Score and Plan: 2 and Ondansetron, Dexamethasone and Treatment may vary due to age or medical condition  Airway Management Planned: LMA  Additional Equipment:   Intra-op Plan:   Post-operative Plan: Extubation in OR  Informed Consent: I have reviewed the patients History and Physical, chart, labs and discussed the procedure including the risks, benefits and alternatives for the proposed anesthesia with the patient or authorized representative who has indicated his/her understanding and acceptance.     Dental advisory given  Plan Discussed with: CRNA  Anesthesia Plan Comments: (Patient consented for risks of anesthesia including but not limited to:  - adverse reactions to medications - damage to eyes, teeth,  lips or other oral mucosa - nerve damage due to positioning  - sore throat or hoarseness - damage to heart, brain, nerves, lungs, other parts of body or loss of life  Informed patient about role of CRNA in peri- and intra-operative care.  Patient voiced understanding.)        Anesthesia Quick Evaluation

## 2021-12-04 NOTE — Progress Notes (Signed)
Consulted to removed R fem HD cath per order. Catheter removed intact per protocol. No bleeding. Vaseline gauze pressure dsg in place. Instructions to RN/and Patient to monitor for bleeding.

## 2021-12-04 NOTE — Progress Notes (Signed)
PROGRESS NOTE    Lawrence [name]  RCV:[id number] DOB: 12/08/1956 DOA: 11/29/2021 PCP: Sharyne Peach, MD   Brief Narrative:  Lawrence [name] is a 65 y.o. male sent to the emergency room by nephrologist for dysfunction of left AV fistula and inability to dialyze.  Patient receives hemodialysis on Tuesdays Thursdays and Saturdays.  Patient did go back to nephrology today to attempt dialysis and they were unable to access the catheter.  Clinically patient reports that he is stable he does not have any complaints.   Vascular surgery consulted, and he underwent right femoral temporary catheter placement, subsequently had tunneled hemodialysis catheter via the right internal jugular vein placed on 12/03/21     Assessment & Plan:   AV fistula malfunction, POA Status post temporary femoral and subsequent tunneled right IJ HD catheter Nephrology following -plan dialysis later this afternoon Vascular tentative plan for IV fistula evaluation and repair later this morning    End-stage renal disease on dialysis  Further recommendations as per nephrology.    Myoclonic jerks:  Continue benztropine and Haldol (at previous home dosing) Continue to monitor for improvement.    Hypertension, essential Well controlled BP parameters.    Anemia of chronic disease Baseline hemoglobin 10-11    Hyperlipidemia:  Resume Pravachol.    Hyperparathyroidism:  Sevelamer with meals.    Chronic Thrombocytopenia  Currently holding heparin     Hyperkalemia:  Valtessa added by nephrology.  Resolved.        DVT prophylaxis: scd's Code Status: full code.  Family Communication: None at bedside.   Status is: Inpatient  Dispo: The patient is from: Home              Anticipated d/c is to: Home              Anticipated d/c date is: 24 to 48 hours              Patient currently not medically stable for discharge  Consultants:  Nephrology, vascular surgery  Procedures:  Temporary femoral  dialysis catheter, tunneled right IJ, fistula evaluation repair  Antimicrobials:  None  Subjective: No acute issues or events overnight  Objective: Vitals:   12/04/21 0436 12/04/21 0640 12/04/21 0718 12/04/21 0741  BP:   108/80   Pulse: 93  97   Resp:   19   Temp:   98.3 F (36.8 C)   TempSrc:      SpO2:   98%   Weight:  83.6 kg  83.6 kg  Height:    5\' 7"  (1.702 m)    Intake/Output Summary (Last 24 hours) at 12/04/2021 0759 Last data filed at 12/04/2021 0000 Gross per 24 hour  Intake 50 ml  Output 0 ml  Net 50 ml   Filed Weights   12/03/21 0500 12/04/21 0640 12/04/21 0741  Weight: 79.3 kg 83.6 kg 83.6 kg    Examination:  General:  Pleasantly resting in bed, No acute distress. HEENT:  Normocephalic atraumatic.  Sclerae nonicteric, noninjected.  Extraocular movements intact bilaterally. Neck:  Without mass or deformity.  Trachea is midline.  Right IJ tunneled catheter clean dry intact Lungs:  Clear to auscultate bilaterally without rhonchi, wheeze, or rales. Heart:  Regular rate and rhythm.  Without murmurs, rubs, or gallops. Abdomen:  Soft, nontender, nondistended.  Without guarding or rebound. Extremities: Without cyanosis, clubbing, edema, or obvious deformity. Vascular:  Dorsalis pedis and posterior tibial pulses palpable bilaterally. Skin:  Warm and dry, no erythema, no ulcerations.  Data Reviewed: I have personally reviewed following labs and imaging studies  CBC: Recent Labs  Lab 11/28/21 1528 11/29/21 1400 11/29/21 2129 11/30/21 1147 12/02/21 1036  WBC 5.1 4.8 4.7 5.1 6.1  NEUTROABS 2.9  --   --  3.9  --   HGB 11.8* 11.6* 11.9* 11.0* 10.4*  HCT 37.3* 36.6* 36.6* 33.7* 32.3*  MCV 106.0* 102.2* 100.5* 100.6* 100.6*  PLT 72* 73* 79* 80* 79*   Basic Metabolic Panel: Recent Labs  Lab 11/30/21 1147 12/01/21 1017 12/02/21 1036 12/03/21 0319 12/04/21 0410  NA 137 133* 136 136 131*  K 5.3* 5.4* 4.7 4.6 4.5  CL 95* 96* 96* 96* 96*  CO2 25 27 28 29  23   GLUCOSE 138* 115* 139* 111* 134*  BUN 81* 50* 62* 41* 64*  CREATININE 15.62* 9.93* 12.61* 9.28* 11.49*  CALCIUM 8.4* 8.5* 8.7* 8.8* 8.8*   GFR: Estimated Creatinine Clearance: 6.6 mL/min (A) (by C-G formula based on SCr of 11.49 mg/dL (H)). Liver Function Tests: Recent Labs  Lab 11/29/21 1400 11/30/21 1147  AST 11* 9*  ALT 13 12  ALKPHOS 91 85  BILITOT 0.7 0.9  PROT 7.2 6.6  ALBUMIN 3.6 3.3*   No results for input(s): LIPASE, AMYLASE in the last 168 hours. No results for input(s): AMMONIA in the last 168 hours. Coagulation Profile: No results for input(s): INR, PROTIME in the last 168 hours. Cardiac Enzymes: No results for input(s): CKTOTAL, CKMB, CKMBINDEX, TROPONINI in the last 168 hours. BNP (last 3 results) No results for input(s): PROBNP in the last 8760 hours. HbA1C: No results for input(s): HGBA1C in the last 72 hours. CBG: Recent Labs  Lab 12/03/21 1207 12/03/21 1311 12/03/21 1732 12/04/21 0015 12/04/21 0612  GLUCAP 152* 112* 160* 139* 123*   Lipid Profile: No results for input(s): CHOL, HDL, LDLCALC, TRIG, CHOLHDL, LDLDIRECT in the last 72 hours. Thyroid Function Tests: No results for input(s): TSH, T4TOTAL, FREET4, T3FREE, THYROIDAB in the last 72 hours. Anemia Panel: No results for input(s): VITAMINB12, FOLATE, FERRITIN, TIBC, IRON, RETICCTPCT in the last 72 hours. Sepsis Labs: No results for input(s): PROCALCITON, LATICACIDVEN in the last 168 hours.  Recent Results (from the past 240 hour(s))  Resp Panel by RT-PCR (Flu A&B, Covid) Nasopharyngeal Swab     Status: None   Collection Time: 11/29/21  6:45 PM   Specimen: Nasopharyngeal Swab; Nasopharyngeal(NP) swabs in vial transport medium  Result Value Ref Range Status   SARS Coronavirus 2 by RT PCR NEGATIVE NEGATIVE Final    Comment: (NOTE) SARS-CoV-2 target nucleic acids are NOT DETECTED.  The SARS-CoV-2 RNA is generally detectable in upper respiratory specimens during the acute phase of  infection. The lowest concentration of SARS-CoV-2 viral copies this assay can detect is 138 copies/mL. A negative result does not preclude SARS-Cov-2 infection and should not be used as the sole basis for treatment or other patient management decisions. A negative result may occur with  improper specimen collection/handling, submission of specimen other than nasopharyngeal swab, presence of viral mutation(s) within the areas targeted by this assay, and inadequate number of viral copies(<138 copies/mL). A negative result must be combined with clinical observations, patient history, and epidemiological information. The expected result is Negative.  Fact Sheet for Patients:  EntrepreneurPulse.com.au  Fact Sheet for Healthcare Providers:  IncredibleEmployment.be  This test is no t yet approved or cleared by the [place] FDA and  has been authorized for detection and/or diagnosis of SARS-CoV-2 by FDA under an Emergency Use Authorization (EUA).  This EUA will remain  in effect (meaning this test can be used) for the duration of the COVID-19 declaration under Section 564(b)(1) of the Act, 21 U.S.C.section 360bbb-3(b)(1), unless the authorization is terminated  or revoked sooner.       Influenza A by PCR NEGATIVE NEGATIVE Final   Influenza B by PCR NEGATIVE NEGATIVE Final    Comment: (NOTE) The Xpert Xpress SARS-CoV-2/FLU/RSV plus assay is intended as an aid in the diagnosis of influenza from Nasopharyngeal swab specimens and should not be used as a sole basis for treatment. Nasal washings and aspirates are unacceptable for Xpert Xpress SARS-CoV-2/FLU/RSV testing.  Fact Sheet for Patients: EntrepreneurPulse.com.au  Fact Sheet for Healthcare Providers: IncredibleEmployment.be  This test is not yet approved or cleared by the [place] FDA and has been authorized for detection and/or diagnosis of SARS-CoV-2  by FDA under an Emergency Use Authorization (EUA). This EUA will remain in effect (meaning this test can be used) for the duration of the COVID-19 declaration under Section 564(b)(1) of the Act, 21 U.S.C. section 360bbb-3(b)(1), unless the authorization is terminated or revoked.  Performed at [facility], [address] [place], [place] [address]     Radiology Studies: PERIPHERAL VASCULAR CATHETERIZATION  Result Date: 12/03/2021 See surgical note for result.    Scheduled Meds:  [MAR Hold] aspirin EC  81 mg Oral Daily   [MAR Hold] benztropine  0.5 mg Oral BID   [MAR Hold] carvedilol  25 mg Oral BID WC   [MAR Hold] Chlorhexidine Gluconate Cloth  6 each Topical Q0600   [MAR Hold] docusate sodium  100 mg Oral BID   [MAR Hold] fluticasone  2 spray Each Nare Daily   [MAR Hold] haloperidol  15 mg Oral QHS   [MAR Hold] heparin sodium (porcine)  1,000 Units Intravenous Once in dialysis   [facility] Hold] insulin aspart  0-6 Units Subcutaneous TID WC   [MAR Hold] midodrine  5 mg Oral BID WC   [MAR Hold] mirtazapine  15 mg Oral QHS   [MAR Hold] niacin  1,000 mg Oral QHS   [MAR Hold] patiromer  16.8 g Oral Daily   [MAR Hold] pravastatin  40 mg Oral QHS   [MAR Hold] sevelamer carbonate  1,600 mg Oral TID WC   sodium chloride flush       Continuous Infusions:  sodium chloride     ceFAZolin      ceFAZolin (ANCEF) IV       LOS: 5 days    Time spent: 41min    Cashius Grandstaff C Braydin Aloi, DO [facility]  If 7PM-7AM, please contact night-coverage [email or web address]  12/04/2021, 7:59 AM

## 2021-12-04 NOTE — Anesthesia Postprocedure Evaluation (Signed)
Anesthesia Post Note  Patient: NUEL DEJAYNES  Procedure(s) Performed: REVISON OF ARTERIOVENOUS FISTULA (Left: Arm Upper) THROMBECTOMY ARTERIOVENOUS FISTULA (Left: Arm Upper)  Patient location during evaluation: PACU Anesthesia Type: General Level of consciousness: awake and alert, oriented and patient cooperative Pain management: pain level controlled Vital Signs Assessment: post-procedure vital signs reviewed and stable Respiratory status: spontaneous breathing, nonlabored ventilation and respiratory function stable Cardiovascular status: blood pressure returned to baseline and stable Postop Assessment: adequate PO intake Anesthetic complications: no   No notable events documented.   Last Vitals:  Vitals:   12/04/21 1045 12/04/21 1100  BP:  113/74  Pulse:  83  Resp:  14  Temp: (!) 36.4 C   SpO2:  97%    Last Pain:  Vitals:   12/04/21 1045  TempSrc:   PainSc: Asleep                 Darrin Nipper

## 2021-12-04 NOTE — Progress Notes (Signed)
Patient incisional site remains unchanged. Dr. Lucky Cowboy to see the patient once finished with current procedure.

## 2021-12-04 NOTE — Progress Notes (Signed)
Per Dr. Lucky Cowboy, patient is okay to return back to the floor, site is okay. Dr. Lucky Cowboy came to evaluate patient at the bedside. Site remains unchanged.

## 2021-12-05 ENCOUNTER — Encounter: Payer: Self-pay | Admitting: Vascular Surgery

## 2021-12-05 LAB — BASIC METABOLIC PANEL
Anion gap: 10 (ref 5–15)
BUN: 44 mg/dL — ABNORMAL HIGH (ref 8–23)
CO2: 28 mmol/L (ref 22–32)
Calcium: 9.1 mg/dL (ref 8.9–10.3)
Chloride: 95 mmol/L — ABNORMAL LOW (ref 98–111)
Creatinine, Ser: 8.78 mg/dL — ABNORMAL HIGH (ref 0.61–1.24)
GFR, Estimated: 6 mL/min — ABNORMAL LOW (ref >60)
Glucose, Bld: 148 mg/dL — ABNORMAL HIGH (ref 70–99)
Potassium: 3.8 mmol/L (ref 3.5–5.1)
Sodium: 133 mmol/L — ABNORMAL LOW (ref 135–145)

## 2021-12-05 LAB — GLUCOSE, CAPILLARY
Glucose-Capillary: 143 mg/dL — ABNORMAL HIGH (ref 70–99)
Glucose-Capillary: 156 mg/dL — ABNORMAL HIGH (ref 70–99)
Glucose-Capillary: 160 mg/dL — ABNORMAL HIGH (ref 70–99)

## 2021-12-05 LAB — CBC
HCT: 31.5 % — ABNORMAL LOW (ref 39.0–52.0)
Hemoglobin: 10.1 g/dL — ABNORMAL LOW (ref 13.0–17.0)
MCH: 31.5 pg (ref 26.0–34.0)
MCHC: 32.1 g/dL (ref 30.0–36.0)
MCV: 98.1 fL (ref 80.0–100.0)
Platelets: 99 10*3/uL — ABNORMAL LOW (ref 150–400)
RBC: 3.21 MIL/uL — ABNORMAL LOW (ref 4.22–5.81)
RDW: 14.6 % (ref 11.5–15.5)
WBC: 8.8 10*3/uL (ref 4.0–10.5)
nRBC: 0 % (ref 0.0–0.2)

## 2021-12-05 LAB — SURGICAL PATHOLOGY

## 2021-12-05 MED ORDER — SEVELAMER CARBONATE 800 MG PO TABS: 1600.0000 mg | ORAL_TABLET | Freq: Three times a day (TID) | ORAL | 0 refills | Status: DC

## 2021-12-05 MED ORDER — CARVEDILOL 25 MG PO TABS: 25.0000 mg | ORAL_TABLET | Freq: Two times a day (BID) | ORAL | 0 refills | Status: DC

## 2021-12-05 NOTE — Care Management Important Message (Signed)
Important Message  Patient Details  Name: Lawrence [name] MRN: [id number] Date of Birth: 05-20-1956   Medicare Important Message Given:  Yes     Juliann Pulse A Martavia Tye 12/05/2021, 11:29 AM

## 2021-12-05 NOTE — Progress Notes (Signed)
[facility]  Daily Progress Note   Subjective  -   Doing well.  Mild tenderness overlying his PermCath and his left arm.  PermCath worked well for dialysis.  No major events overnight  Objective Vitals:   12/05/21 0500 12/05/21 0510 12/05/21 0801 12/05/21 1106  BP:  93/64 96/62 92/65   Pulse:  82 (!) 112 (!) 105  Resp:  18 18 18   Temp:  98.8 F (37.1 C) 98.4 F (36.9 C) 98.2 F (36.8 C)  TempSrc:  Oral Oral Oral  SpO2:  97% 96% 100%  Weight: 85.7 kg     Height:        Intake/Output Summary (Last 24 hours) at 12/05/2021 1303 Last data filed at 12/04/2021 2048 Gross per 24 hour  Intake --  Output 1001 ml  Net -1001 ml    PULM  CTAB CV  RRR VASC  PermCath site clean, dry, and intact.  Left arm mildly swollen with good thrill and Artegraft revision of AV fistula.  Incisions are clean, dry, and intact  Laboratory CBC    Component Value Date/Time   WBC 8.8 12/05/2021 0432   HGB 10.1 (L) 12/05/2021 0432   HGB 8.0 (L) 03/21/2015 1512   HCT 31.5 (L) 12/05/2021 0432   HCT 24.8 (L) 03/21/2015 1512   PLT 99 (L) 12/05/2021 0432   PLT 93 (L) 03/21/2015 1512    BMET    Component Value Date/Time   NA 133 (L) 12/05/2021 0432   NA 140 03/21/2015 1512   K 3.8 12/05/2021 0432   K 4.0 03/21/2015 1512   CL 95 (L) 12/05/2021 0432   CL 95 (L) 03/21/2015 1512   CO2 28 12/05/2021 0432   CO2 36 (H) 03/21/2015 1512   GLUCOSE 148 (H) 12/05/2021 0432   GLUCOSE 117 (H) 03/21/2015 1512   BUN 44 (H) 12/05/2021 0432   BUN 64 (H) 03/21/2015 1512   CREATININE 8.78 (H) 12/05/2021 0432   CREATININE 7.71 (H) 03/21/2015 1512   CALCIUM 9.1 12/05/2021 0432   CALCIUM 8.5 (L) 03/21/2015 1512   GFRNONAA 6 (L) 12/05/2021 0432   GFRNONAA 7 (L) 03/21/2015 1512   GFRAA 21 (L) 03/08/2020 1756   GFRAA 8 (L) 03/21/2015 1512    Assessment/Planning: POD #1 s/p thrombectomy and revision of left brachiocephalic AV fistula  Arm looks good.  PermCath is working for  dialysis. Fistula will not be usable for 3-4 more weeks.  Can follow-up in the office in about 4 weeks to see if we can then use his fistula for dialysis.  [name]  12/05/2021, 1:03 PM

## 2021-12-05 NOTE — TOC Transition Note (Signed)
Transition of Care [facility]) - CM/SW Discharge Note   Patient Details  Name: Lawrence [name] MRN: [id number] Date of Birth: Apr 22, 1956  [facility]) CM/SW Contact:  Candie Chroman, LCSW Phone Number: 12/05/2021, 1:07 PM   Clinical Narrative: Patient has orders to discharge home today. He is a resident at [facility]. Spoke with caregiver Dorna Bloom. She will pick him up when ready. No home health prior to admission. Signed FL2 and discharge summary printed and will be put in discharge packet to go home with him. No further concerns. CSW signing off.    Final next level of care: Group Home Barriers to Discharge: No Barriers Identified   Patient Goals and CMS Choice        Discharge Placement                Patient to be transferred to facility by: [facility] caregiver will transport. Name of family member notified: Dorna Bloom Patient and family notified of of transfer: 12/05/21  Discharge Plan and Services                                     Social Determinants of Health (SDOH) Interventions     Readmission Risk Interventions No flowsheet data found.

## 2021-12-05 NOTE — Progress Notes (Signed)
[facility]  ROUNDING NOTE   Subjective:   [name] was admitted to [facility] on 11/29/2021 for Dialysis AV fistula malfunction ([place]) [U20.254Y] Complication of arteriovenous dialysis fistula, initial encounter [T82.9XXA]  Patient seen sitting up in bed  Alert and oriented Complains of soreness in LUE  Dialysis yesterday, tolerated well   Objective:  Vital signs in last 24 hours:  Temp:  [97.7 F (36.5 C)-98.8 F (37.1 C)] 98.2 F (36.8 C) (12/29 1106) Pulse Rate:  [82-112] 105 (12/29 1106) Resp:  [15-24] 18 (12/29 1106) BP: (82-119)/(54-80) 92/65 (12/29 1106) SpO2:  [96 %-100 %] 100 % (12/29 1106) Weight:  [83.6 kg-86.6 kg] 85.7 kg (12/29 0500)  Weight change: 0 kg Filed Weights   12/04/21 1430 12/04/21 1745 12/05/21 0500  Weight: 83.6 kg 86.6 kg 85.7 kg    Intake/Output: I/O last 3 completed shifts: In: 300 [I.V.:200; IV Piggyback:100] Out: 1006 [Other:1001; Blood:5]   Intake/Output this shift:  No intake/output data recorded.  Physical Exam: General: NAD, resting in bed  Head: Normocephalic, atraumatic. Moist oral mucosal membranes  Eyes: Anicteric  Lungs:  Clear to auscultation, normal effort  Heart: Regular rate and rhythm  Abdomen:  Soft, nontender  Extremities: No peripheral edema.  Neurologic: Alert, able to answer questioning  Skin: No lesions  Access: Left AVF thrombosed (surgical dressing). Rt IJ Permcath placed by Dew on 70/62/37    Basic Metabolic Panel: Recent Labs  Lab 12/01/21 1017 12/02/21 1036 12/03/21 0319 12/04/21 0410 12/05/21 0432  NA 133* 136 136 131* 133*  K 5.4* 4.7 4.6 4.5 3.8  CL 96* 96* 96* 96* 95*  CO2 27 28 29 23 28   GLUCOSE 115* 139* 111* 134* 148*  BUN 50* 62* 41* 64* 44*  CREATININE 9.93* 12.61* 9.28* 11.49* 8.78*  CALCIUM 8.5* 8.7* 8.8* 8.8* 9.1     Liver Function Tests: Recent Labs  Lab 11/29/21 1400 11/30/21 1147  AST 11* 9*  ALT 13 12  ALKPHOS 91 85  BILITOT 0.7 0.9  PROT 7.2  6.6  ALBUMIN 3.6 3.3*    No results for input(s): LIPASE, AMYLASE in the last 168 hours. No results for input(s): AMMONIA in the last 168 hours.  CBC: Recent Labs  Lab 11/28/21 1528 11/29/21 1400 11/29/21 2129 11/30/21 1147 12/02/21 1036 12/05/21 0432  WBC 5.1 4.8 4.7 5.1 6.1 8.8  NEUTROABS 2.9  --   --  3.9  --   --   HGB 11.8* 11.6* 11.9* 11.0* 10.4* 10.1*  HCT 37.3* 36.6* 36.6* 33.7* 32.3* 31.5*  MCV 106.0* 102.2* 100.5* 100.6* 100.6* 98.1  PLT 72* 73* 79* 80* 79* 99*     Cardiac Enzymes: No results for input(s): CKTOTAL, CKMB, CKMBINDEX, TROPONINI in the last 168 hours.  BNP: Invalid input(s): POCBNP  CBG: Recent Labs  Lab 12/04/21 1205 12/04/21 2144 12/05/21 0505 12/05/21 0931 12/05/21 1104  GLUCAP 125* 186* 143* 156* 160*     Microbiology: Results for orders placed or performed during the hospital encounter of 11/29/21  Resp Panel by RT-PCR (Flu A&B, Covid) Nasopharyngeal Swab     Status: None   Collection Time: 11/29/21  6:45 PM   Specimen: Nasopharyngeal Swab; Nasopharyngeal(NP) swabs in vial transport medium  Result Value Ref Range Status   SARS Coronavirus 2 by RT PCR NEGATIVE NEGATIVE Final    Comment: (NOTE) SARS-CoV-2 target nucleic acids are NOT DETECTED.  The SARS-CoV-2 RNA is generally detectable in upper respiratory specimens during the acute phase of infection. The lowest  concentration of SARS-CoV-2 viral copies this assay can detect is 138 copies/mL. A negative result does not preclude SARS-Cov-2 infection and should not be used as the sole basis for treatment or other patient management decisions. A negative result may occur with  improper specimen collection/handling, submission of specimen other than nasopharyngeal swab, presence of viral mutation(s) within the areas targeted by this assay, and inadequate number of viral copies(<138 copies/mL). A negative result must be combined with clinical observations, patient history, and  epidemiological information. The expected result is Negative.  Fact Sheet for Patients:  EntrepreneurPulse.com.au  Fact Sheet for Healthcare Providers:  IncredibleEmployment.be  This test is no t yet approved or cleared by the [place] FDA and  has been authorized for detection and/or diagnosis of SARS-CoV-2 by FDA under an Emergency Use Authorization (EUA). This EUA will remain  in effect (meaning this test can be used) for the duration of the COVID-19 declaration under Section 564(b)(1) of the Act, 21 U.S.C.section 360bbb-3(b)(1), unless the authorization is terminated  or revoked sooner.       Influenza A by PCR NEGATIVE NEGATIVE Final   Influenza B by PCR NEGATIVE NEGATIVE Final    Comment: (NOTE) The Xpert Xpress SARS-CoV-2/FLU/RSV plus assay is intended as an aid in the diagnosis of influenza from Nasopharyngeal swab specimens and should not be used as a sole basis for treatment. Nasal washings and aspirates are unacceptable for Xpert Xpress SARS-CoV-2/FLU/RSV testing.  Fact Sheet for Patients: EntrepreneurPulse.com.au  Fact Sheet for Healthcare Providers: IncredibleEmployment.be  This test is not yet approved or cleared by the [place] FDA and has been authorized for detection and/or diagnosis of SARS-CoV-2 by FDA under an Emergency Use Authorization (EUA). This EUA will remain in effect (meaning this test can be used) for the duration of the COVID-19 declaration under Section 564(b)(1) of the Act, 21 U.S.C. section 360bbb-3(b)(1), unless the authorization is terminated or revoked.  Performed at [facility], [address] [place], [place] [address]     Coagulation Studies: No results for input(s): LABPROT, INR in the last 72 hours.  Urinalysis: No results for input(s): COLORURINE, LABSPEC, PHURINE, GLUCOSEU, HGBUR, BILIRUBINUR, KETONESUR, PROTEINUR, UROBILINOGEN,  NITRITE, LEUKOCYTESUR in the last 72 hours.  Invalid input(s): APPERANCEUR    Imaging: PERIPHERAL VASCULAR CATHETERIZATION  Result Date: 12/03/2021 See surgical note for result.    Medications:     aspirin EC  81 mg Oral Daily   benztropine  0.5 mg Oral BID   carvedilol  25 mg Oral BID WC   Chlorhexidine Gluconate Cloth  6 each Topical Q0600   docusate sodium  100 mg Oral BID   fluticasone  2 spray Each Nare Daily   haloperidol  15 mg Oral QHS   heparin sodium (porcine)  1,000 Units Intravenous Once in dialysis   insulin aspart  0-6 Units Subcutaneous TID WC   midodrine  5 mg Oral Q M,W,F-HD   mirtazapine  15 mg Oral QHS   niacin  1,000 mg Oral QHS   pravastatin  40 mg Oral QHS   sevelamer carbonate  1,600 mg Oral TID WC   acetaminophen **OR** acetaminophen  Assessment/ Plan:  [name] is a 65 y.o. black male with end stage renal disease on hemodialysis, hypertension, learning disability, schizophrenia, CVA, hyperlipidemia, who is admitted to [facility] on 11/29/2021 for Dialysis AV fistula malfunction ([place]) [C94.709G] Complication of arteriovenous dialysis fistula, initial encounter [T82.9XXA]  [facility] MWF [facility] Left AVF (thrombosed) 73.5kg  End stage  renal disease: with hyperkalemia and complication of dialysis device. Now with right femoral temp HD catheter by Vascular. Hemodialysis treatment on Saturday. Patient to be transitioned to MWF this week.  - Appreciate vascular input and placement of Rt Permcath - Received dialysis yesterday, UF goal 1L achieved.  - Next treatment scheduled for Friday  Hypertension: soft at 92/65 on carvedilol.  - Continue midodrine with treatments.   Anemia of chronic kidney disease: hemoglobin 10.1, normocytic Mircera as outpatient.   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 Haloperidol and benztropine restarted.  Not reported today  Secondary Hyperparathyroidism: PTH, calcium and phosphorus at  goal.  -Continue sevelamer with meals.     LOS: 6   12/29/202211:54 AM

## 2021-12-05 NOTE — NC FL2 (Signed)
[place] LEVEL OF CARE SCREENING TOOL     IDENTIFICATION  Patient Name: Lawrence [name] Birthdate: 27-Nov-1956 Sex: male Admission Date (Current Location): 11/29/2021  [facility] and [place] Number:  Engineering geologist and Address:  [facility], [address] [place], [place] [address]      Provider Number: [id number]  Attending Physician Name and Address:  [name], MD  Relative Name and Phone Number:       Current Level of Care: Hospital Recommended Level of Care: [facility] Prior Approval Number:    Date Approved/Denied:   PASRR Number:    Discharge Plan: Other (Comment) [facility])    Current Diagnoses: Patient Active Problem List   Diagnosis Date Noted   Dialysis AV fistula malfunction ([place]) 11/29/2021   Fluid overload, unspecified 05/30/2020   Schizophrenia ([place]) 03/09/2020   Anuria 10/02/2019   Xerosis cutis 10/02/2019   Secondary hyperparathyroidism of renal origin ([place]) 06/28/2019   Leukopenia 04/17/2019   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   Elevated ferritin 04/17/2019   Hypokalemia 10/14/2017   Anemia in ESRD (end-stage renal disease) ([place]) 10/14/2017   Thrombocytopenia ([place]) 10/14/2017   Acute urinary tract infection 10/13/2017   History of CVA (cerebrovascular accident) 10/13/2017   Chronic schizoaffective disorder ([place]) 04/09/2016   Other schizoaffective disorders ([place]) 02/63/7858   Complication of diabetes mellitus ([place]) 03/09/2016   Stage 5 chronic kidney disease ([place]) 03/09/2016   Thyroid nodule 03/09/2016   Aftercare including intermittent dialysis ([place]) 09/25/2015   Encounter for immunization 07/03/2015   Moderate protein-calorie malnutrition ([place]) 85/01/7740   Complication of vascular dialysis catheter 04/30/2015   Chest pain, unspecified 04/26/2015   Coagulation defect, unspecified ([place]) 04/26/2015   Disorder of phosphorus metabolism, unspecified 04/26/2015   Hypoglycemia, unspecified  04/26/2015   Iron deficiency anemia, unspecified 04/26/2015   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[place]) 04/26/2015   Pain, unspecified 04/26/2015   Pruritus, unspecified 04/26/2015   Shortness of breath 04/26/2015   Type 2 diabetes mellitus without complications ([place]) 28/78/6767   Essential (primary) hypertension 04/26/2015   Schizophrenia, unspecified ([place]) 04/26/2015   Thrombocytopenia, unspecified ([place]) 04/26/2015   ESRD on dialysis ([place]) 04/16/2015   Goiter 02/27/2015   Dry mouth 02/27/2015   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   Controlled type 2 diabetes mellitus without complication, without long-term current use of insulin ([place]) 01/21/2015   Benign hypertension with CKD (chronic kidney disease) stage IV ([place]) 01/21/2015   Encounter for screening for malignant neoplasm of colon 08/02/2014   Vitamin D deficiency 05/12/2013   Cerebrovascular accident (CVA) ([place]) 04/19/2013   Type 2 diabetes mellitus ([place]) 04/19/2013    Orientation RESPIRATION BLADDER Height & Weight     Self, Time, Situation, Place  Normal Continent Weight: 188 lb 15 oz (85.7 kg) Height:  5\' 7"  (170.2 cm)  BEHAVIORAL SYMPTOMS/MOOD NEUROLOGICAL BOWEL NUTRITION STATUS   (None)  (None) Continent Diet (Renal)  AMBULATORY STATUS COMMUNICATION OF NEEDS Skin     Verbally Surgical wounds, Other (Comment) (Cracking on feet. Incision on left arm (honeycomb dressing).)                       Personal Care Assistance Level of Assistance              Functional Limitations Info  Sight, Hearing, Speech Sight Info: Adequate Hearing Info: Adequate Speech Info: Adequate    SPECIAL CARE FACTORS FREQUENCY  Contractures Contractures Info: Not present    Additional Factors Info  Code Status, Allergies, Psychotropic Code Status Info: Full code Allergies Info: Nsaids Psychotropic Info: Schizoaffective disorder, Schizophrenia         Current  Medications (12/05/2021):  This is the current hospital active medication list Current Facility-Administered Medications  Medication Dose Route Frequency Provider Last Rate Last Admin   acetaminophen (TYLENOL) tablet 650 mg  650 mg Oral Q6H PRN Algernon Huxley, MD   650 mg at 12/05/21 1207   Or   acetaminophen (TYLENOL) suppository 650 mg  650 mg Rectal Q6H PRN Algernon Huxley, MD       aspirin EC tablet 81 mg  81 mg Oral Daily Algernon Huxley, MD   81 mg at 12/05/21 0920   benztropine (COGENTIN) tablet 0.5 mg  0.5 mg Oral BID Algernon Huxley, MD   0.5 mg at 12/05/21 1208   carvedilol (COREG) tablet 25 mg  25 mg Oral BID WC Algernon Huxley, MD   25 mg at 12/05/21 0920   Chlorhexidine Gluconate Cloth 2 % PADS 6 each  6 each Topical Q0600 Algernon Huxley, MD   6 each at 12/05/21 0517   docusate sodium (COLACE) capsule 100 mg  100 mg Oral BID Algernon Huxley, MD   100 mg at 12/05/21 0920   fluticasone (FLONASE) 50 MCG/ACT nasal spray 2 spray  2 spray Each Nare Daily Algernon Huxley, MD   2 spray at 12/05/21 1207   haloperidol (HALDOL) tablet 15 mg  15 mg Oral QHS Algernon Huxley, MD   15 mg at 12/04/21 2053   heparin sodium (porcine) injection 1,000 Units  1,000 Units Intravenous Once in dialysis Algernon Huxley, MD       insulin aspart (novoLOG) injection 0-6 Units  0-6 Units Subcutaneous TID WC Algernon Huxley, MD   1 Units at 12/05/21 1158   midodrine (PROAMATINE) tablet 5 mg  5 mg Oral Q M,W,F-HD Colon Flattery, NP       mirtazapine (REMERON) tablet 15 mg  15 mg Oral QHS Algernon Huxley, MD   15 mg at 12/04/21 2053   niacin (NIASPAN) CR tablet 1,000 mg  1,000 mg Oral QHS Algernon Huxley, MD   1,000 mg at 12/04/21 2053   pravastatin (PRAVACHOL) tablet 40 mg  40 mg Oral QHS Algernon Huxley, MD   40 mg at 12/04/21 2052   sevelamer carbonate (RENVELA) tablet 1,600 mg  1,600 mg Oral TID WC Algernon Huxley, MD   1,600 mg at 12/05/21 1157     Discharge Medications: TAKE these medications     acetaminophen 500 MG tablet Commonly known  as: TYLENOL Take 500-1,000 mg by mouth every 6 (six) hours as needed for mild pain or moderate pain.    aspirin EC 81 MG tablet Take 81 mg by mouth daily.    benztropine 0.5 MG tablet Commonly known as: COGENTIN Take 0.5 mg by mouth 2 (two) times daily.    carvedilol 25 MG tablet Commonly known as: COREG Take 1 tablet (25 mg total) by mouth 2 (two) times daily with a meal.    cetirizine 10 MG tablet Commonly known as: ZYRTEC Take 5 mg by mouth at bedtime.    Coenzyme Q10 100 MG capsule Take 100 mg by mouth daily.    diphenhydrAMINE 25 mg capsule Commonly known as: BENADRYL Take 25 mg by mouth See admin instructions. Take 1 capsule (25mg ) by mouth  nightly as needed for sleep - may take 1 capsule (25mg ) by mouth during the daytime as needed for itching    docusate sodium 100 MG capsule Commonly known as: COLACE Take 100 mg by mouth 2 (two) times daily.    fluticasone 50 MCG/ACT nasal spray Commonly known as: FLONASE Place 2 sprays into both nostrils daily.    glipiZIDE 2.5 MG 24 hr tablet Commonly known as: GLUCOTROL XL Take 2.5 mg by mouth daily with breakfast.    haloperidol 10 MG tablet Commonly known as: HALDOL Take 15 mg by mouth at bedtime.    lidocaine-prilocaine cream Commonly known as: EMLA Apply 1 application topically 3 (three) times a week. Apply over dialysis shunt 30 minutes prior to dialysis.    midodrine 5 MG tablet Commonly known as: PROAMATINE Take 5 mg by mouth 3 (three) times a week. (Take 1 hour prior to dialysis)    mirtazapine 15 MG tablet Commonly known as: REMERON Take 15 mg by mouth at bedtime.    multivitamin with minerals tablet Take 1 tablet by mouth daily.    niacin 1000 MG CR tablet Commonly known as: NIASPAN Take 1,000 mg by mouth at bedtime.    pravastatin 40 MG tablet Commonly known as: PRAVACHOL Take 40 mg by mouth at bedtime.    sevelamer carbonate 800 MG tablet Commonly known as: RENVELA Take 2 tablets (1,600 mg  total) by mouth 3 (three) times daily with meals.    Relevant Imaging Results:  Relevant Lab Results:   Additional Information SS#: [id number]. HD TTS [employer].  Candie Chroman, LCSW

## 2021-12-05 NOTE — Discharge Summary (Signed)
Physician Discharge Summary  Lawrence [name] DVV:[id number] DOB: [birth year] DOA: 11/29/2021  PCP: Sharyne Peach, MD  Admit date: 11/29/2021 Discharge date: 12/05/2021  Admitted From: Facility Disposition: Same  Recommendations for Outpatient Follow-up:  Follow up with PCP in 1-2 weeks Please obtain BMP/CBC in one week Please follow up with nephrology as scheduled  Discharge Condition: Stable CODE STATUS: Full Diet recommendation: Renal diet as tolerated  Brief/Interim Summary:  Lawrence [name] is a 65 y.o. male sent to the emergency room by nephrologist for dysfunction of left AV fistula and inability to dialyze.  Patient receives hemodialysis on Tuesdays Thursdays and Saturdays.  Patient did go back to nephrology today to attempt dialysis and they were unable to access the catheter.  Clinically patient reports that he is stable he does not have any complaints.    Vascular surgery consulted, and he underwent right femoral temporary catheter placement, subsequently had tunneled hemodialysis catheter via the right internal jugular vein placed on 12/03/21. Temp cath removed 12/28, fistula repaired successfully 12/28.     Assessment & Plan:   AV fistula malfunction, POA Status post temporary femoral and subsequent tunneled right IJ HD catheter Nephrology following/Vascular following Temp HD cath removed; fistula repaired -wait 4 weeks prior to use.   End-stage renal disease on dialysis  Further recommendations as per nephrology.    Myoclonic jerks:  Continue benztropine and Haldol (at previous home dosing) Continue to monitor for improvement.    Hypertension, essential Well controlled BP parameters.    Anemia of chronic disease Baseline hemoglobin 10-11    Hyperlipidemia:  Resume Pravachol.    Hyperparathyroidism:  Sevelamer with meals.    Chronic Thrombocytopenia  Currently holding heparin   Hyperkalemia:  Valtessa added by nephrology.  Resolved.     Discharge Instructions   Allergies as of 12/05/2021       Reactions   Nsaids Other (See Comments)   Cannot take due to renal disease        Medication List     TAKE these medications    acetaminophen 500 MG tablet Commonly known as: TYLENOL Take 500-1,000 mg by mouth every 6 (six) hours as needed for mild pain or moderate pain.   aspirin EC 81 MG tablet Take 81 mg by mouth daily.   benztropine 0.5 MG tablet Commonly known as: COGENTIN Take 0.5 mg by mouth 2 (two) times daily.   carvedilol 25 MG tablet Commonly known as: COREG Take 1 tablet (25 mg total) by mouth 2 (two) times daily with a meal.   cetirizine 10 MG tablet Commonly known as: ZYRTEC Take 5 mg by mouth at bedtime.   Coenzyme Q10 100 MG capsule Take 100 mg by mouth daily.   diphenhydrAMINE 25 mg capsule Commonly known as: BENADRYL Take 25 mg by mouth See admin instructions. Take 1 capsule (25mg ) by mouth nightly as needed for sleep - may take 1 capsule (25mg ) by mouth during the daytime as needed for itching   docusate sodium 100 MG capsule Commonly known as: COLACE Take 100 mg by mouth 2 (two) times daily.   fluticasone 50 MCG/ACT nasal spray Commonly known as: FLONASE Place 2 sprays into both nostrils daily.   glipiZIDE 2.5 MG 24 hr tablet Commonly known as: GLUCOTROL XL Take 2.5 mg by mouth daily with breakfast.   haloperidol 10 MG tablet Commonly known as: HALDOL Take 15 mg by mouth at bedtime.   lidocaine-prilocaine cream Commonly known as: EMLA Apply 1 application topically 3 (three) times  a week. Apply over dialysis shunt 30 minutes prior to dialysis.   midodrine 5 MG tablet Commonly known as: PROAMATINE Take 5 mg by mouth 3 (three) times a week. (Take 1 hour prior to dialysis)   mirtazapine 15 MG tablet Commonly known as: REMERON Take 15 mg by mouth at bedtime.   multivitamin with minerals tablet Take 1 tablet by mouth daily.   niacin 1000 MG CR tablet Commonly known  as: NIASPAN Take 1,000 mg by mouth at bedtime.   pravastatin 40 MG tablet Commonly known as: PRAVACHOL Take 40 mg by mouth at bedtime.   sevelamer carbonate 800 MG tablet Commonly known as: RENVELA Take 2 tablets (1,600 mg total) by mouth 3 (three) times daily with meals.        Allergies  Allergen Reactions   Nsaids Other (See Comments)    Cannot take due to renal disease    Consultations: Nephrology, vascular surgery  Procedures/Studies: PERIPHERAL VASCULAR CATHETERIZATION  Result Date: 12/03/2021 See surgical note for result.    Subjective: No acute issues or events overnight   Discharge Exam: Vitals:   12/05/21 0801 12/05/21 1106  BP: 96/62 92/65  Pulse: (!) 112 (!) 105  Resp: 18 18  Temp: 98.4 F (36.9 C) 98.2 F (36.8 C)  SpO2: 96% 100%   Vitals:   12/05/21 0500 12/05/21 0510 12/05/21 0801 12/05/21 1106  BP:  93/64 96/62 92/65   Pulse:  82 (!) 112 (!) 105  Resp:  18 18 18   Temp:  98.8 F (37.1 C) 98.4 F (36.9 C) 98.2 F (36.8 C)  TempSrc:  Oral Oral Oral  SpO2:  97% 96% 100%  Weight: 85.7 kg     Height:        General: Pt is alert, awake, not in acute distress Cardiovascular: RRR, S1/S2 +, no rubs, no gallops Respiratory: CTA bilaterally, no wheezing, no rhonchi Abdominal: Soft, NT, ND, bowel sounds + Extremities: no edema, no cyanosis, left upper extremity fistula with bruit    The results of significant diagnostics from this hospitalization (including imaging, microbiology, ancillary and laboratory) are listed below for reference.     Microbiology: Recent Results (from the past 240 hour(s))  Resp Panel by RT-PCR (Flu A&B, Covid) Nasopharyngeal Swab     Status: None   Collection Time: 11/29/21  6:45 PM   Specimen: Nasopharyngeal Swab; Nasopharyngeal(NP) swabs in vial transport medium  Result Value Ref Range Status   SARS Coronavirus 2 by RT PCR NEGATIVE NEGATIVE Final    Comment: (NOTE) SARS-CoV-2 target nucleic acids are NOT  DETECTED.  The SARS-CoV-2 RNA is generally detectable in upper respiratory specimens during the acute phase of infection. The lowest concentration of SARS-CoV-2 viral copies this assay can detect is 138 copies/mL. A negative result does not preclude SARS-Cov-2 infection and should not be used as the sole basis for treatment or other patient management decisions. A negative result may occur with  improper specimen collection/handling, submission of specimen other than nasopharyngeal swab, presence of viral mutation(s) within the areas targeted by this assay, and inadequate number of viral copies(<138 copies/mL). A negative result must be combined with clinical observations, patient history, and epidemiological information. The expected result is Negative.  Fact Sheet for Patients:  EntrepreneurPulse.com.au  Fact Sheet for Healthcare Providers:  IncredibleEmployment.be  This test is no t yet approved or cleared by the [place] FDA and  has been authorized for detection and/or diagnosis of SARS-CoV-2 by FDA under an Emergency Use Authorization (EUA). This EUA  will remain  in effect (meaning this test can be used) for the duration of the COVID-19 declaration under Section 564(b)(1) of the Act, 21 U.S.C.section 360bbb-3(b)(1), unless the authorization is terminated  or revoked sooner.       Influenza A by PCR NEGATIVE NEGATIVE Final   Influenza B by PCR NEGATIVE NEGATIVE Final    Comment: (NOTE) The Xpert Xpress SARS-CoV-2/FLU/RSV plus assay is intended as an aid in the diagnosis of influenza from Nasopharyngeal swab specimens and should not be used as a sole basis for treatment. Nasal washings and aspirates are unacceptable for Xpert Xpress SARS-CoV-2/FLU/RSV testing.  Fact Sheet for Patients: EntrepreneurPulse.com.au  Fact Sheet for Healthcare Providers: IncredibleEmployment.be  This test is not yet  approved or cleared by the [place] FDA and has been authorized for detection and/or diagnosis of SARS-CoV-2 by FDA under an Emergency Use Authorization (EUA). This EUA will remain in effect (meaning this test can be used) for the duration of the COVID-19 declaration under Section 564(b)(1) of the Act, 21 U.S.C. section 360bbb-3(b)(1), unless the authorization is terminated or revoked.  Performed at [facility], [address] [place], [place] [address]      Labs: BNP (last 3 results) No results for input(s): BNP in the last 8760 hours. Basic Metabolic Panel: Recent Labs  Lab 12/01/21 1017 12/02/21 1036 12/03/21 0319 12/04/21 0410 12/05/21 0432  NA 133* 136 136 131* 133*  K 5.4* 4.7 4.6 4.5 3.8  CL 96* 96* 96* 96* 95*  CO2 27 28 29 23 28   GLUCOSE 115* 139* 111* 134* 148*  BUN 50* 62* 41* 64* 44*  CREATININE 9.93* 12.61* 9.28* 11.49* 8.78*  CALCIUM 8.5* 8.7* 8.8* 8.8* 9.1   Liver Function Tests: Recent Labs  Lab 11/29/21 1400 11/30/21 1147  AST 11* 9*  ALT 13 12  ALKPHOS 91 85  BILITOT 0.7 0.9  PROT 7.2 6.6  ALBUMIN 3.6 3.3*   No results for input(s): LIPASE, AMYLASE in the last 168 hours. No results for input(s): AMMONIA in the last 168 hours. CBC: Recent Labs  Lab 11/28/21 1528 11/29/21 1400 11/29/21 2129 11/30/21 1147 12/02/21 1036 12/05/21 0432  WBC 5.1 4.8 4.7 5.1 6.1 8.8  NEUTROABS 2.9  --   --  3.9  --   --   HGB 11.8* 11.6* 11.9* 11.0* 10.4* 10.1*  HCT 37.3* 36.6* 36.6* 33.7* 32.3* 31.5*  MCV 106.0* 102.2* 100.5* 100.6* 100.6* 98.1  PLT 72* 73* 79* 80* 79* 99*   Cardiac Enzymes: No results for input(s): CKTOTAL, CKMB, CKMBINDEX, TROPONINI in the last 168 hours. BNP: Invalid input(s): POCBNP CBG: Recent Labs  Lab 12/04/21 1205 12/04/21 2144 12/05/21 0505 12/05/21 0931 12/05/21 1104  GLUCAP 125* 186* 143* 156* 160*   D-Dimer No results for input(s): DDIMER in the last 72 hours. Hgb A1c No results for input(s):  HGBA1C in the last 72 hours. Lipid Profile No results for input(s): CHOL, HDL, LDLCALC, TRIG, CHOLHDL, LDLDIRECT in the last 72 hours. Thyroid function studies No results for input(s): TSH, T4TOTAL, T3FREE, THYROIDAB in the last 72 hours.  Invalid input(s): FREET3 Anemia work up No results for input(s): VITAMINB12, FOLATE, FERRITIN, TIBC, IRON, RETICCTPCT in the last 72 hours. Urinalysis    Component Value Date/Time   COLORURINE BROWN (A) 10/13/2017 1703   APPEARANCEUR HAZY (A) 10/13/2017 1703   APPEARANCEUR Clear 12/13/2014 1354   LABSPEC 1.020 10/13/2017 1703   LABSPEC 1.006 12/13/2014 1354   PHURINE 8.5 (H) 10/13/2017 1703   GLUCOSEU 250 (  A) 10/13/2017 1703   GLUCOSEU 50 mg/dL 12/13/2014 1354   HGBUR LARGE (A) 10/13/2017 1703   BILIRUBINUR MODERATE (A) 10/13/2017 1703   BILIRUBINUR Negative 12/13/2014 1354   KETONESUR TRACE (A) 10/13/2017 1703   PROTEINUR >300 (A) 10/13/2017 1703   NITRITE POSITIVE (A) 10/13/2017 1703   LEUKOCYTESUR LARGE (A) 10/13/2017 1703   LEUKOCYTESUR Negative 12/13/2014 1354   Sepsis Labs Invalid input(s): PROCALCITONIN,  WBC,  LACTICIDVEN Microbiology Recent Results (from the past 240 hour(s))  Resp Panel by RT-PCR (Flu A&B, Covid) Nasopharyngeal Swab     Status: None   Collection Time: 11/29/21  6:45 PM   Specimen: Nasopharyngeal Swab; Nasopharyngeal(NP) swabs in vial transport medium  Result Value Ref Range Status   SARS Coronavirus 2 by RT PCR NEGATIVE NEGATIVE Final    Comment: (NOTE) SARS-CoV-2 target nucleic acids are NOT DETECTED.  The SARS-CoV-2 RNA is generally detectable in upper respiratory specimens during the acute phase of infection. The lowest concentration of SARS-CoV-2 viral copies this assay can detect is 138 copies/mL. A negative result does not preclude SARS-Cov-2 infection and should not be used as the sole basis for treatment or other patient management decisions. A negative result may occur with  improper specimen  collection/handling, submission of specimen other than nasopharyngeal swab, presence of viral mutation(s) within the areas targeted by this assay, and inadequate number of viral copies(<138 copies/mL). A negative result must be combined with clinical observations, patient history, and epidemiological information. The expected result is Negative.  Fact Sheet for Patients:  EntrepreneurPulse.com.au  Fact Sheet for Healthcare Providers:  IncredibleEmployment.be  This test is no t yet approved or cleared by the [place] FDA and  has been authorized for detection and/or diagnosis of SARS-CoV-2 by FDA under an Emergency Use Authorization (EUA). This EUA will remain  in effect (meaning this test can be used) for the duration of the COVID-19 declaration under Section 564(b)(1) of the Act, 21 U.S.C.section 360bbb-3(b)(1), unless the authorization is terminated  or revoked sooner.       Influenza A by PCR NEGATIVE NEGATIVE Final   Influenza B by PCR NEGATIVE NEGATIVE Final    Comment: (NOTE) The Xpert Xpress SARS-CoV-2/FLU/RSV plus assay is intended as an aid in the diagnosis of influenza from Nasopharyngeal swab specimens and should not be used as a sole basis for treatment. Nasal washings and aspirates are unacceptable for Xpert Xpress SARS-CoV-2/FLU/RSV testing.  Fact Sheet for Patients: EntrepreneurPulse.com.au  Fact Sheet for Healthcare Providers: IncredibleEmployment.be  This test is not yet approved or cleared by the [place] FDA and has been authorized for detection and/or diagnosis of SARS-CoV-2 by FDA under an Emergency Use Authorization (EUA). This EUA will remain in effect (meaning this test can be used) for the duration of the COVID-19 declaration under Section 564(b)(1) of the Act, 21 U.S.C. section 360bbb-3(b)(1), unless the authorization is terminated or revoked.  Performed at [facility], [address], [place], [place] [address]      Time coordinating discharge: Over 30 minutes  SIGNED:   [name], DO [facility] 12/05/2021, 12:54 PM Pager   If 7PM-7AM, please contact night-coverage [email or web address]

## 2022-01-01 ENCOUNTER — Encounter (INDEPENDENT_AMBULATORY_CARE_PROVIDER_SITE_OTHER): Payer: Medicare Other

## 2022-01-01 ENCOUNTER — Ambulatory Visit (INDEPENDENT_AMBULATORY_CARE_PROVIDER_SITE_OTHER): Payer: Medicare Other | Admitting: Nurse Practitioner

## 2022-01-02 ENCOUNTER — Other Ambulatory Visit: Payer: Self-pay

## 2022-01-02 ENCOUNTER — Ambulatory Visit (INDEPENDENT_AMBULATORY_CARE_PROVIDER_SITE_OTHER): Payer: Medicare Other | Admitting: Nurse Practitioner

## 2022-01-02 ENCOUNTER — Ambulatory Visit (INDEPENDENT_AMBULATORY_CARE_PROVIDER_SITE_OTHER): Payer: Medicare Other

## 2022-01-02 ENCOUNTER — Other Ambulatory Visit (INDEPENDENT_AMBULATORY_CARE_PROVIDER_SITE_OTHER): Payer: Self-pay | Admitting: Vascular Surgery

## 2022-01-02 ENCOUNTER — Encounter (INDEPENDENT_AMBULATORY_CARE_PROVIDER_SITE_OTHER): Payer: Self-pay | Admitting: Nurse Practitioner

## 2022-01-02 VITALS — BP 96/63 | HR 103 | Resp 16 | Wt 161.0 lb

## 2022-01-02 DIAGNOSIS — T829XXD Unspecified complication of cardiac and vascular prosthetic device, implant and graft, subsequent encounter: Secondary | ICD-10-CM | POA: Diagnosis not present

## 2022-01-02 DIAGNOSIS — Z992 Dependence on renal dialysis: Secondary | ICD-10-CM

## 2022-01-02 DIAGNOSIS — N186 End stage renal disease: Secondary | ICD-10-CM

## 2022-01-02 DIAGNOSIS — E1122 Type 2 diabetes mellitus with diabetic chronic kidney disease: Secondary | ICD-10-CM

## 2022-01-02 DIAGNOSIS — I10 Essential (primary) hypertension: Secondary | ICD-10-CM

## 2022-01-05 ENCOUNTER — Encounter (INDEPENDENT_AMBULATORY_CARE_PROVIDER_SITE_OTHER): Payer: Self-pay | Admitting: Nurse Practitioner

## 2022-01-05 NOTE — Progress Notes (Signed)
Subjective:    Patient ID: Lawrence [name], male    DOB: December 08, 1956, 66 y.o.   MRN: [id number] Chief Complaint  Patient presents with   Routine Post Op    [facility] 4 week follow up    Lawrence [name] is a-year-old male that presents postintervention on 12/04/2021 including:  PROCEDURE:   1. Jump graft revision as well as thrombectomy to left brachiocephalic arteriovenous fistula with 4 Fogarty embolectomy balloon and 7 mm Artegraft 2. 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  The patient denies any significant pain or issues postintervention.  Today the patient has a flow volume of 1761.  No areas of significant stenosis.   Review of Systems  Skin:  Negative for wound.  All other systems reviewed and are negative.     Objective:   Physical Exam Vitals reviewed.  HENT:     Head: Normocephalic.  Cardiovascular:     Rate and Rhythm: Normal rate.     Pulses:          Radial pulses are 2+ on the left side.     Arteriovenous access: Left arteriovenous access is present.    Comments: Good thrill and bruit and jump graft Pulmonary:     Effort: Pulmonary effort is normal.  Skin:    General: Skin is warm and dry.  Neurological:     Mental Status: He is alert and oriented to person, place, and time.  Psychiatric:        Mood and Affect: Mood normal.        Behavior: Behavior normal.        Thought Content: Thought content normal.        Judgment: Judgment normal.    BP 96/63 (BP Location: Right Arm)    Pulse (!) 103    Resp 16    Wt 161 lb (73 kg)    BMI 25.22 kg/m   Past Medical History:  Diagnosis Date   Anemia    Cerebral hemorrhage ([place]) 2012   Chronic constipation    Chronic kidney disease    Diabetes mellitus without complication (HCC)    Hemodialysis patient ([place])    Hyperlipidemia    Hypertension    Schizophrenia ([place])    Stroke [facility])     Social History   Socioeconomic History   Marital status: Single     Spouse name: Not on file   Number of children: Not on file   Years of education: Not on file   Highest education level: Not on file  Occupational History   Not on file  Tobacco Use   Smoking status: Never   Smokeless tobacco: Never  Substance and Sexual Activity   Alcohol use: No   Drug use: No   Sexual activity: Not Currently    Birth control/protection: None  Other Topics Concern   Not on file  Social History Narrative    Lives at group home: [facility] with 6 other people.   Social Determinants of Health   Financial Resource Strain: Not on file  Food Insecurity: Not on file  Transportation Needs: Not on file  Physical Activity: Not on file  Stress: Not on file  Social Connections: Not on file  Intimate Partner Violence: Not on file    Past Surgical History:  Procedure Laterality Date   A/V FISTULAGRAM Left 02/24/2017   Procedure: A/V Fistulagram;  Surgeon: Katha Cabal, MD;  Location: [place] [facility];  Service: Cardiovascular;  Laterality: Left;   A/V FISTULAGRAM Left 09/03/2020   Procedure: A/V FISTULAGRAM;  Surgeon: Algernon Huxley, MD;  Location: [place] [facility];  Service: Cardiovascular;  Laterality: Left;   A/V FISTULAGRAM Left 06/27/2021   Procedure: A/V FISTULAGRAM;  Surgeon: Algernon Huxley, MD;  Location: [place] [facility];  Service: Cardiovascular;  Laterality: Left;   A/V FISTULAGRAM Left 07/24/2021   Procedure: A/V FISTULAGRAM;  Surgeon: Algernon Huxley, MD;  Location: [place] [facility];  Service: Cardiovascular;  Laterality: Left;   DIALYSIS/PERMA CATHETER INSERTION N/A 12/03/2021   Procedure: DIALYSIS/PERMA CATHETER INSERTION;  Surgeon: Algernon Huxley, MD;  Location: [place] [facility];  Service: Cardiovascular;  Laterality: N/A;   PERIPHERAL VASCULAR CATHETERIZATION N/A 04/17/2015   Procedure: Dialysis/Perma Catheter Insertion;  Surgeon: Katha Cabal, MD;  Location: [place] [facility];  Service: Cardiovascular;  Laterality: N/A;    PERIPHERAL VASCULAR CATHETERIZATION Left 07/30/2015   Procedure: A/V Shuntogram/Fistulagram;  Surgeon: Algernon Huxley, MD;  Location: [place] [facility];  Service: Cardiovascular;  Laterality: Left;   PERIPHERAL VASCULAR CATHETERIZATION Left 07/30/2015   Procedure: A/V Shunt Intervention;  Surgeon: Algernon Huxley, MD;  Location: [place] [facility];  Service: Cardiovascular;  Laterality: Left;   PERIPHERAL VASCULAR CATHETERIZATION N/A 08/20/2015   Procedure: DIALYSIS/PERMA CATHETER REMOVAL;  Surgeon: Algernon Huxley, MD;  Location: [place] [facility];  Service: Cardiovascular;  Laterality: N/A;   REVISON OF ARTERIOVENOUS FISTULA Left 12/04/2021   Procedure: REVISON OF ARTERIOVENOUS FISTULA;  Surgeon: Algernon Huxley, MD;  Location: [facility];  Service: Vascular;  Laterality: Left;   THROMBECTOMY W/ EMBOLECTOMY Left 12/04/2021   Procedure: THROMBECTOMY ARTERIOVENOUS FISTULA;  Surgeon: Algernon Huxley, MD;  Location: [facility];  Service: Vascular;  Laterality: Left;   VASCULAR SURGERY     Fistula placement    Family History  Problem Relation Age of Onset   Diabetes Mother    Kidney cancer Mother    Diabetes Sister    Stroke Brother    Prostate cancer Neg Hx    Bladder Cancer Neg Hx     Allergies  Allergen Reactions   Nsaids Other (See Comments)    Cannot take due to renal disease    CBC Latest Ref Rng & Units 12/05/2021 12/02/2021 11/30/2021  WBC 4.0 - 10.5 K/uL 8.8 6.1 5.1  Hemoglobin 13.0 - 17.0 g/dL 10.1(L) 10.4(L) 11.0(L)  Hematocrit 39.0 - 52.0 % 31.5(L) 32.3(L) 33.7(L)  Platelets 150 - 400 K/uL 99(L) 79(L) 80(L)      CMP     Component Value Date/Time   NA 133 (L) 12/05/2021 0432   NA 140 03/21/2015 1512   K 3.8 12/05/2021 0432   K 4.0 03/21/2015 1512   CL 95 (L) 12/05/2021 0432   CL 95 (L) 03/21/2015 1512   CO2 28 12/05/2021 0432   CO2 36 (H) 03/21/2015 1512   GLUCOSE 148 (H) 12/05/2021 0432   GLUCOSE 117 (H) 03/21/2015 1512   BUN 44 (H) 12/05/2021 0432   BUN 64 (H)  03/21/2015 1512   CREATININE 8.78 (H) 12/05/2021 0432   CREATININE 7.71 (H) 03/21/2015 1512   CALCIUM 9.1 12/05/2021 0432   CALCIUM 8.5 (L) 03/21/2015 1512   PROT 6.6 11/30/2021 1147   PROT 5.8 (L) 12/13/2014 1354   ALBUMIN 3.3 (L) 11/30/2021 1147   ALBUMIN 2.5 (L) 12/13/2014 1354   AST 9 (L) 11/30/2021 1147   AST 17 12/13/2014 1354   ALT 12 11/30/2021 1147  ALT 21 12/13/2014 1354   ALKPHOS 85 11/30/2021 1147   ALKPHOS 50 12/13/2014 1354   BILITOT 0.9 11/30/2021 1147   BILITOT 0.2 12/13/2014 1354   GFRNONAA 6 (L) 12/05/2021 0432   GFRNONAA 7 (L) 03/21/2015 1512   GFRAA 21 (L) 03/08/2020 1756   GFRAA 8 (L) 03/21/2015 1512     No results found.     Assessment & Plan:   1. ESRD (end stage renal disease) ([place]) The patient is due to graft has a good thrill and bruit with a good flow volume however there is concern that based on its location it may be difficult to stick.  We had our technician mark exactly where the fistula was prior to being utilized by the technicians at dialysis.  We will send a letter indicating it is available for usage.  If the patient has no issues we will see him back in 3 months for evaluation.  However the dialysis center has difficulties with running we may need to consider resection of the thrombosed aneurysmal portion as well as possible superficialization of the graft.  2. Essential hypertension Continue antihypertensive medications as already ordered, these medications have been reviewed and there are no changes at this time.   3. Type 2 diabetes mellitus with chronic kidney disease on chronic dialysis, unspecified whether long term insulin use ([place]) Continue hypoglycemic medications as already ordered, these medications have been reviewed and there are no changes at this time.  Hgb A1C to be monitored as already arranged by primary service    Current Outpatient Medications on File Prior to Visit  Medication Sig Dispense Refill   acetaminophen  (TYLENOL) 500 MG tablet Take 500-1,000 mg by mouth every 6 (six) hours as needed for mild pain or moderate pain.     aspirin EC 81 MG tablet Take 81 mg by mouth daily.      benztropine (COGENTIN) 0.5 MG tablet Take 0.5 mg by mouth 2 (two) times daily.     carvedilol (COREG) 25 MG tablet Take 1 tablet (25 mg total) by mouth 2 (two) times daily with a meal. (Patient taking differently: Take 25 mg by mouth 2 (two) times daily with a meal.) 60 tablet 0   cetirizine (ZYRTEC) 10 MG tablet Take 5 mg by mouth at bedtime.     Coenzyme Q10 100 MG capsule Take 100 mg by mouth daily.     diphenhydrAMINE (BENADRYL) 25 mg capsule Take 25 mg by mouth See admin instructions. Take 1 capsule (25mg ) by mouth nightly as needed for sleep - may take 1 capsule (25mg ) by mouth during the daytime as needed for itching     docusate sodium (COLACE) 100 MG capsule Take 100 mg by mouth 2 (two) times daily.     fluticasone (FLONASE) 50 MCG/ACT nasal spray Place 2 sprays into both nostrils daily.     glipiZIDE (GLUCOTROL XL) 2.5 MG 24 hr tablet Take 2.5 mg by mouth daily with breakfast.      haloperidol (HALDOL) 10 MG tablet Take 15 mg by mouth at bedtime.     lidocaine-prilocaine (EMLA) cream Apply 1 application topically 3 (three) times a week. Apply over dialysis shunt 30 minutes prior to dialysis.     midodrine (PROAMATINE) 5 MG tablet Take 5 mg by mouth 3 (three) times a week. (Take 1 hour prior to dialysis)     mirtazapine (REMERON) 15 MG tablet Take 15 mg by mouth at bedtime.     Multiple Vitamins-Minerals (MULTIVITAMIN WITH MINERALS)  tablet Take 1 tablet by mouth daily.      niacin (NIASPAN) 1000 MG CR tablet Take 1,000 mg by mouth at bedtime.      pravastatin (PRAVACHOL) 40 MG tablet Take 40 mg by mouth at bedtime.      sevelamer carbonate (RENVELA) 800 MG tablet Take 2 tablets (1,600 mg total) by mouth 3 (three) times daily with meals. 180 tablet 0   No current facility-administered medications on file prior to visit.     There are no Patient Instructions on file for this visit. No follow-ups on file.   Kris Hartmann, NP

## 2022-01-07 ENCOUNTER — Encounter: Payer: Self-pay | Admitting: Vascular Surgery

## 2022-02-18 ENCOUNTER — Ambulatory Visit (INDEPENDENT_AMBULATORY_CARE_PROVIDER_SITE_OTHER): Payer: Medicare Other | Admitting: Vascular Surgery

## 2022-02-18 ENCOUNTER — Other Ambulatory Visit: Payer: Self-pay

## 2022-02-18 ENCOUNTER — Encounter (INDEPENDENT_AMBULATORY_CARE_PROVIDER_SITE_OTHER): Payer: Self-pay | Admitting: Vascular Surgery

## 2022-02-18 VITALS — BP 94/62 | HR 84 | Resp 16 | Ht 67.0 in | Wt 163.0 lb

## 2022-02-18 DIAGNOSIS — N186 End stage renal disease: Secondary | ICD-10-CM

## 2022-02-18 DIAGNOSIS — I10 Essential (primary) hypertension: Secondary | ICD-10-CM

## 2022-02-18 DIAGNOSIS — E1122 Type 2 diabetes mellitus with diabetic chronic kidney disease: Secondary | ICD-10-CM

## 2022-02-18 DIAGNOSIS — Z992 Dependence on renal dialysis: Secondary | ICD-10-CM

## 2022-02-18 NOTE — Assessment & Plan Note (Signed)
blood pressure control important in reducing the progression of atherosclerotic disease. On appropriate oral medications.  

## 2022-02-18 NOTE — Progress Notes (Signed)
? ? ?Patient ID: Lawrence [name], male   DOB: 1956/10/30, 66 y.o.   MRN: [id number] ? ?Chief Complaint  ?Patient presents with  ? Follow-up  ?  Consult for left upper arm  ? ? ?HPI ?Lawrence [name] is a 66 y.o. male.  Patient returns in follow-up.  He is about 10 or 11 weeks status post jump graft revision of an aneurysmal left upper arm AV fistula.  It is working well for dialysis.  His wounds are healed.  The aneurysmal portions that were ligated are smaller although not entirely gone.  He still has his catheter in place but is not using it currently. ? ? ?Past Medical History:  ?Diagnosis Date  ? Anemia   ? Cerebral hemorrhage ([place]) 2012  ? Chronic constipation   ? Chronic kidney disease   ? Diabetes mellitus without complication ([place])   ? Hemodialysis patient [facility])   ? Hyperlipidemia   ? Hypertension   ? Schizophrenia ([place])   ? Stroke [facility])   ? ? ?Past Surgical History:  ?Procedure Laterality Date  ? A/V FISTULAGRAM Left 02/24/2017  ? Procedure: A/V Fistulagram;  Surgeon: Katha Cabal, MD;  Location: [place] [facility];  Service: Cardiovascular;  Laterality: Left;  ? A/V FISTULAGRAM Left 09/03/2020  ? Procedure: A/V FISTULAGRAM;  Surgeon: Algernon Huxley, MD;  Location: [place] [facility];  Service: Cardiovascular;  Laterality: Left;  ? A/V FISTULAGRAM Left 06/27/2021  ? Procedure: A/V FISTULAGRAM;  Surgeon: Algernon Huxley, MD;  Location: [place] [facility];  Service: Cardiovascular;  Laterality: Left;  ? A/V FISTULAGRAM Left 07/24/2021  ? Procedure: A/V FISTULAGRAM;  Surgeon: Algernon Huxley, MD;  Location: [place] [facility];  Service: Cardiovascular;  Laterality: Left;  ? DIALYSIS/PERMA CATHETER INSERTION N/A 12/03/2021  ? Procedure: DIALYSIS/PERMA CATHETER INSERTION;  Surgeon: Algernon Huxley, MD;  Location: [place] [facility];  Service: Cardiovascular;  Laterality: N/A;  ? PERIPHERAL VASCULAR CATHETERIZATION N/A 04/17/2015  ? Procedure: Dialysis/Perma Catheter Insertion;  Surgeon: Katha Cabal, MD;   Location: [place] [facility];  Service: Cardiovascular;  Laterality: N/A;  ? PERIPHERAL VASCULAR CATHETERIZATION Left 07/30/2015  ? Procedure: A/V Shuntogram/Fistulagram;  Surgeon: Algernon Huxley, MD;  Location: [place] [facility];  Service: Cardiovascular;  Laterality: Left;  ? PERIPHERAL VASCULAR CATHETERIZATION Left 07/30/2015  ? Procedure: A/V Shunt Intervention;  Surgeon: Algernon Huxley, MD;  Location: [place] CV [facility];  Service: Cardiovascular;  Laterality: Left;  ? PERIPHERAL VASCULAR CATHETERIZATION N/A 08/20/2015  ? Procedure: DIALYSIS/PERMA CATHETER REMOVAL;  Surgeon: Algernon Huxley, MD;  Location: [place] CV [facility];  Service: Cardiovascular;  Laterality: N/A;  ? REVISON OF ARTERIOVENOUS FISTULA Left 12/04/2021  ? Procedure: REVISON OF ARTERIOVENOUS FISTULA;  Surgeon: Algernon Huxley, MD;  Location: [facility];  Service: Vascular;  Laterality: Left;  ? THROMBECTOMY W/ EMBOLECTOMY Left 12/04/2021  ? Procedure: THROMBECTOMY ARTERIOVENOUS FISTULA;  Surgeon: Algernon Huxley, MD;  Location: [facility];  Service: Vascular;  Laterality: Left;  ? VASCULAR SURGERY    ? Fistula placement  ? ? ? ? ?Allergies  ?Allergen Reactions  ? Nsaids Other (See Comments)  ?  Cannot take due to renal disease  ? ? ?Current Outpatient Medications  ?Medication Sig Dispense Refill  ? acetaminophen (TYLENOL) 500 MG tablet Take 500-1,000 mg by mouth every 6 (six) hours as needed for mild pain or moderate pain.    ? aspirin EC 81 MG tablet Take 81 mg by mouth daily.     ?  benztropine (COGENTIN) 0.5 MG tablet Take 0.5 mg by mouth 2 (two) times daily.    ? carvedilol (COREG) 25 MG tablet Take 1 tablet (25 mg total) by mouth 2 (two) times daily with a meal. (Patient taking differently: Take 25 mg by mouth 2 (two) times daily with a meal.) 60 tablet 0  ? cetirizine (ZYRTEC) 10 MG tablet Take 5 mg by mouth at bedtime.    ? Cinacalcet HCl (SENSIPAR PO) Take by mouth.    ? Coenzyme Q10 100 MG capsule Take 100 mg by mouth daily.    ? diphenhydrAMINE  (BENADRYL) 25 mg capsule Take 25 mg by mouth See admin instructions. Take 1 capsule ('25mg'$ ) by mouth nightly as needed for sleep - may take 1 capsule ('25mg'$ ) by mouth during the daytime as needed for itching    ? docusate sodium (COLACE) 100 MG capsule Take 100 mg by mouth 2 (two) times daily.    ? fluticasone (FLONASE) 50 MCG/ACT nasal spray Place 2 sprays into both nostrils daily.    ? glipiZIDE (GLUCOTROL XL) 2.5 MG 24 hr tablet Take 2.5 mg by mouth daily with breakfast.     ? haloperidol (HALDOL) 10 MG tablet Take 15 mg by mouth at bedtime.    ? lidocaine-prilocaine (EMLA) cream Apply 1 application topically 3 (three) times a week. Apply over dialysis shunt 30 minutes prior to dialysis.    ? midodrine (PROAMATINE) 5 MG tablet Take 5 mg by mouth 3 (three) times a week. (Take 1 hour prior to dialysis)    ? mirtazapine (REMERON) 15 MG tablet Take 15 mg by mouth at bedtime.    ? Multiple Vitamins-Minerals (MULTIVITAMIN WITH MINERALS) tablet Take 1 tablet by mouth daily.     ? niacin (NIASPAN) 1000 MG CR tablet Take 1,000 mg by mouth at bedtime.     ? pravastatin (PRAVACHOL) 40 MG tablet Take 40 mg by mouth at bedtime.     ? sevelamer carbonate (RENVELA) 800 MG tablet Take 2 tablets (1,600 mg total) by mouth 3 (three) times daily with meals. 180 tablet 0  ? ?No current facility-administered medications for this visit.  ? ? ? ? ? ? ?Physical Exam ?BP 94/62 (BP Location: Right Arm)   Pulse 84   Resp 16   Ht '5\' 7"'$  (1.702 m)   Wt 163 lb (73.9 kg)   BMI 25.53 kg/m?  ?Gen:  WD/WN, NAD ?Skin: incision C/D/I.  Good thrill in the jump graft.  Aneurysmal segment without any flow and smaller than prior to surgery. ? ? ? ? ?Assessment/Plan: ? ?ESRD on dialysis [facility]) ?The jump graft is working well.  His PermCath can be removed next week.  We will plan on checking a duplex in the next few months. ? ?Essential (primary) hypertension ?blood pressure control important in reducing the progression of atherosclerotic disease. On  appropriate oral medications. ? ? ?Type 2 diabetes mellitus ([place]) ?blood glucose control important in reducing the progression of atherosclerotic disease. Also, involved in wound healing. On appropriate medications. ? ? ? ? ? ?[name] ?02/18/2022, 2:02 PM ? ? ?This note was created with Dragon medical transcription system.  Any errors from dictation are unintentional.    ?

## 2022-02-18 NOTE — H&P (View-Only) (Signed)
? ? ?Patient ID: Lawrence [name], male   DOB: November 03, 1956, 66 y.o.   MRN: [id number] ? ?Chief Complaint  ?Patient presents with  ? Follow-up  ?  Consult for left upper arm  ? ? ?HPI ?[name] is a 66 y.o. male.  Patient returns in follow-up.  He is about 10 or 11 weeks status post jump graft revision of an aneurysmal left upper arm AV fistula.  It is working well for dialysis.  His wounds are healed.  The aneurysmal portions that were ligated are smaller although not entirely gone.  He still has his catheter in place but is not using it currently. ? ? ?Past Medical History:  ?Diagnosis Date  ? Anemia   ? Cerebral hemorrhage ([place]) 2012  ? Chronic constipation   ? Chronic kidney disease   ? Diabetes mellitus without complication ([place])   ? Hemodialysis patient [facility])   ? Hyperlipidemia   ? Hypertension   ? Schizophrenia ([place])   ? Stroke [facility])   ? ? ?Past Surgical History:  ?Procedure Laterality Date  ? A/V FISTULAGRAM Left 02/24/2017  ? Procedure: A/V Fistulagram;  Surgeon: Katha Cabal, MD;  Location: [place] [facility];  Service: Cardiovascular;  Laterality: Left;  ? A/V FISTULAGRAM Left 09/03/2020  ? Procedure: A/V FISTULAGRAM;  Surgeon: Algernon Huxley, MD;  Location: [place] [facility];  Service: Cardiovascular;  Laterality: Left;  ? A/V FISTULAGRAM Left 06/27/2021  ? Procedure: A/V FISTULAGRAM;  Surgeon: Algernon Huxley, MD;  Location: [place] [facility];  Service: Cardiovascular;  Laterality: Left;  ? A/V FISTULAGRAM Left 07/24/2021  ? Procedure: A/V FISTULAGRAM;  Surgeon: Algernon Huxley, MD;  Location: [place] [facility];  Service: Cardiovascular;  Laterality: Left;  ? DIALYSIS/PERMA CATHETER INSERTION N/A 12/03/2021  ? Procedure: DIALYSIS/PERMA CATHETER INSERTION;  Surgeon: Algernon Huxley, MD;  Location: [place] [facility];  Service: Cardiovascular;  Laterality: N/A;  ? PERIPHERAL VASCULAR CATHETERIZATION N/A 04/17/2015  ? Procedure: Dialysis/Perma Catheter Insertion;  Surgeon: Katha Cabal, MD;   Location: [place] [facility];  Service: Cardiovascular;  Laterality: N/A;  ? PERIPHERAL VASCULAR CATHETERIZATION Left 07/30/2015  ? Procedure: A/V Shuntogram/Fistulagram;  Surgeon: Algernon Huxley, MD;  Location: [place] CV LAB;  Service: Cardiovascular;  Laterality: Left;  ? PERIPHERAL VASCULAR CATHETERIZATION Left 07/30/2015  ? Procedure: A/V Shunt Intervention;  Surgeon: Algernon Huxley, MD;  Location: [place] CV [facility];  Service: Cardiovascular;  Laterality: Left;  ? PERIPHERAL VASCULAR CATHETERIZATION N/A 08/20/2015  ? Procedure: DIALYSIS/PERMA CATHETER REMOVAL;  Surgeon: Algernon Huxley, MD;  Location: [place] CV [facility];  Service: Cardiovascular;  Laterality: N/A;  ? REVISON OF ARTERIOVENOUS FISTULA Left 12/04/2021  ? Procedure: REVISON OF ARTERIOVENOUS FISTULA;  Surgeon: Algernon Huxley, MD;  Location: [facility];  Service: Vascular;  Laterality: Left;  ? THROMBECTOMY W/ EMBOLECTOMY Left 12/04/2021  ? Procedure: THROMBECTOMY ARTERIOVENOUS FISTULA;  Surgeon: Algernon Huxley, MD;  Location: [facility];  Service: Vascular;  Laterality: Left;  ? VASCULAR SURGERY    ? Fistula placement  ? ? ? ? ?Allergies  ?Allergen Reactions  ? Nsaids Other (See Comments)  ?  Cannot take due to renal disease  ? ? ?Current Outpatient Medications  ?Medication Sig Dispense Refill  ? acetaminophen (TYLENOL) 500 MG tablet Take 500-1,000 mg by mouth every 6 (six) hours as needed for mild pain or moderate pain.    ? aspirin EC 81 MG tablet Take 81 mg by mouth daily.     ?  benztropine (COGENTIN) 0.5 MG tablet Take 0.5 mg by mouth 2 (two) times daily.    ? carvedilol (COREG) 25 MG tablet Take 1 tablet (25 mg total) by mouth 2 (two) times daily with a meal. (Patient taking differently: Take 25 mg by mouth 2 (two) times daily with a meal.) 60 tablet 0  ? cetirizine (ZYRTEC) 10 MG tablet Take 5 mg by mouth at bedtime.    ? Cinacalcet HCl (SENSIPAR PO) Take by mouth.    ? Coenzyme Q10 100 MG capsule Take 100 mg by mouth daily.    ? diphenhydrAMINE  (BENADRYL) 25 mg capsule Take 25 mg by mouth See admin instructions. Take 1 capsule ('25mg'$ ) by mouth nightly as needed for sleep - may take 1 capsule ('25mg'$ ) by mouth during the daytime as needed for itching    ? docusate sodium (COLACE) 100 MG capsule Take 100 mg by mouth 2 (two) times daily.    ? fluticasone (FLONASE) 50 MCG/ACT nasal spray Place 2 sprays into both nostrils daily.    ? glipiZIDE (GLUCOTROL XL) 2.5 MG 24 hr tablet Take 2.5 mg by mouth daily with breakfast.     ? haloperidol (HALDOL) 10 MG tablet Take 15 mg by mouth at bedtime.    ? lidocaine-prilocaine (EMLA) cream Apply 1 application topically 3 (three) times a week. Apply over dialysis shunt 30 minutes prior to dialysis.    ? midodrine (PROAMATINE) 5 MG tablet Take 5 mg by mouth 3 (three) times a week. (Take 1 hour prior to dialysis)    ? mirtazapine (REMERON) 15 MG tablet Take 15 mg by mouth at bedtime.    ? Multiple Vitamins-Minerals (MULTIVITAMIN WITH MINERALS) tablet Take 1 tablet by mouth daily.     ? niacin (NIASPAN) 1000 MG CR tablet Take 1,000 mg by mouth at bedtime.     ? pravastatin (PRAVACHOL) 40 MG tablet Take 40 mg by mouth at bedtime.     ? sevelamer carbonate (RENVELA) 800 MG tablet Take 2 tablets (1,600 mg total) by mouth 3 (three) times daily with meals. 180 tablet 0  ? ?No current facility-administered medications for this visit.  ? ? ? ? ? ? ?Physical Exam ?BP 94/62 (BP Location: Right Arm)   Pulse 84   Resp 16   Ht '5\' 7"'$  (1.702 m)   Wt 163 lb (73.9 kg)   BMI 25.53 kg/m?  ?Gen:  WD/WN, NAD ?Skin: incision C/D/I.  Good thrill in the jump graft.  Aneurysmal segment without any flow and smaller than prior to surgery. ? ? ? ? ?Assessment/Plan: ? ?ESRD on dialysis [facility]) ?The jump graft is working well.  His PermCath can be removed next week.  We will plan on checking a duplex in the next few months. ? ?Essential (primary) hypertension ?blood pressure control important in reducing the progression of atherosclerotic disease. On  appropriate oral medications. ? ? ?Type 2 diabetes mellitus ([place]) ?blood glucose control important in reducing the progression of atherosclerotic disease. Also, involved in wound healing. On appropriate medications. ? ? ? ? ? ?[name] ?02/18/2022, 2:02 PM ? ? ?This note was created with Dragon medical transcription system.  Any errors from dictation are unintentional.    ?

## 2022-02-18 NOTE — Assessment & Plan Note (Signed)
blood glucose control important in reducing the progression of atherosclerotic disease. Also, involved in wound healing. On appropriate medications.  

## 2022-02-18 NOTE — Assessment & Plan Note (Signed)
The jump graft is working well.  His PermCath can be removed next week.  We will plan on checking a duplex in the next few months. ?

## 2022-02-26 ENCOUNTER — Telehealth (INDEPENDENT_AMBULATORY_CARE_PROVIDER_SITE_OTHER): Payer: Self-pay

## 2022-02-26 NOTE — Telephone Encounter (Signed)
Spoke with Sherlynn Carbon at [facility] and the patient is scheduled with Dr. Delana Meyer on 03/04/22 for a permcath removal with a 1:15 pm arrival time to the MM. Pre-procedure instructions were discussed and will be faxed to [facility] at [facility] dialysis. ?

## 2022-03-04 DIAGNOSIS — N186 End stage renal disease: Secondary | ICD-10-CM

## 2022-03-06 ENCOUNTER — Telehealth (INDEPENDENT_AMBULATORY_CARE_PROVIDER_SITE_OTHER): Payer: Self-pay

## 2022-03-06 NOTE — Telephone Encounter (Signed)
Spoke with an employee at the [facility] and the patient has been rescheduled with Dr. Lucky Cowboy to 03/13/22 with a 9:30 am arrival time to the MM. Pre-procedure instructions were discussed and will be faxed to the facility. ?

## 2022-03-13 ENCOUNTER — Ambulatory Visit
Admission: RE | Admit: 2022-03-13 | Discharge: 2022-03-13 | Disposition: A | Discharge status: Home / Self Care | Payer: Medicare Other | Source: Ambulatory Visit | Attending: Vascular Surgery | Admitting: Vascular Surgery

## 2022-03-13 ENCOUNTER — Other Ambulatory Visit: Payer: Self-pay

## 2022-03-13 ENCOUNTER — Encounter
Admission: RE | Disposition: A | Discharge status: Home / Self Care | Payer: Self-pay | Source: Ambulatory Visit | Attending: Vascular Surgery

## 2022-03-13 ENCOUNTER — Encounter: Payer: Self-pay | Admitting: Vascular Surgery

## 2022-03-13 DIAGNOSIS — Z8673 Personal history of transient ischemic attack (TIA), and cerebral infarction without residual deficits: Secondary | ICD-10-CM | POA: Insufficient documentation

## 2022-03-13 DIAGNOSIS — N186 End stage renal disease: Secondary | ICD-10-CM | POA: Insufficient documentation

## 2022-03-13 DIAGNOSIS — Z7982 Long term (current) use of aspirin: Secondary | ICD-10-CM | POA: Insufficient documentation

## 2022-03-13 DIAGNOSIS — Z7984 Long term (current) use of oral hypoglycemic drugs: Secondary | ICD-10-CM | POA: Insufficient documentation

## 2022-03-13 DIAGNOSIS — F209 Schizophrenia, unspecified: Secondary | ICD-10-CM | POA: Insufficient documentation

## 2022-03-13 DIAGNOSIS — Z79899 Other long term (current) drug therapy: Secondary | ICD-10-CM | POA: Diagnosis not present

## 2022-03-13 DIAGNOSIS — E1122 Type 2 diabetes mellitus with diabetic chronic kidney disease: Secondary | ICD-10-CM | POA: Diagnosis not present

## 2022-03-13 DIAGNOSIS — E785 Hyperlipidemia, unspecified: Secondary | ICD-10-CM | POA: Insufficient documentation

## 2022-03-13 DIAGNOSIS — Z4901 Encounter for fitting and adjustment of extracorporeal dialysis catheter: Secondary | ICD-10-CM | POA: Insufficient documentation

## 2022-03-13 DIAGNOSIS — I120 Hypertensive chronic kidney disease with stage 5 chronic kidney disease or end stage renal disease: Secondary | ICD-10-CM | POA: Insufficient documentation

## 2022-03-13 HISTORY — PX: DIALYSIS/PERMA CATHETER REMOVAL: CATH118289

## 2022-03-13 SURGERY — DIALYSIS/PERMA CATHETER REMOVAL
Anesthesia: LOCAL

## 2022-03-13 MED ORDER — LIDOCAINE HCL (PF) 1 % IJ SOLN
INTRAMUSCULAR | Status: DC | PRN
  Administered 2022-03-13: 15 mL

## 2022-03-13 SURGICAL SUPPLY — 2 items
FORCEPS HALSTEAD CVD 5IN STRL (INSTRUMENTS) ×1 IMPLANT
TRAY LACERAT/PLASTIC (MISCELLANEOUS) ×1 IMPLANT

## 2022-03-13 NOTE — Op Note (Signed)
Operative Note ? ? ? ? ?Preoperative diagnosis:   1. ESRD with functional permanent access ? ?Postoperative diagnosis:  1. ESRD with functional permanent access ? ?Procedure:  Removal of right jugular Permcath ? ?Surgeon:  [name], MD ? ?Anesthesia:  Local ? ?EBL:  Minimal ? ?Indication for the Procedure:  The patient has a functional permanent dialysis access and no longer needs their permcath.  This can be removed.  Risks and benefits are discussed and informed consent is obtained. ? ?Description of the Procedure:  The patient's right neck, chest and existing catheter were sterilely prepped and draped. The area around the catheter was anesthetized copiously with 1% lidocaine. The catheter was dissected out with curved hemostats until the cuff was freed from the surrounding fibrous sheath. The fiber sheath was transected, and the catheter was then removed in its entirety using gentle traction. Pressure was held and sterile dressings were placed. The patient tolerated the procedure well and was taken to the recovery room in stable condition. ? ? ? ? ?[name] ? ?03/13/2022, 11:05 AM ?This note was created with Dragon Medical transcription system. Any errors in dictation are purely unintentional.  ?

## 2022-03-13 NOTE — Interval H&P Note (Signed)
History and Physical Interval Note: ? ?03/13/2022 ?9:35 AM ? ?Lawrence [name]  has presented today for surgery, with the diagnosis of Perma Cath Removal   End Stage Renal.  The various methods of treatment have been discussed with the patient and family. After consideration of risks, benefits and other options for treatment, the patient has consented to  Procedure(s): ?DIALYSIS/PERMA CATHETER REMOVAL (N/A) as a surgical intervention.  The patient's history has been reviewed, patient examined, no change in status, stable for surgery.  I have reviewed the patient's chart and labs.  Questions were answered to the patient's satisfaction.   ? ? ?[name] ? ? ?

## 2022-03-24 ENCOUNTER — Telehealth (INDEPENDENT_AMBULATORY_CARE_PROVIDER_SITE_OTHER): Payer: Self-pay

## 2022-03-24 ENCOUNTER — Other Ambulatory Visit (INDEPENDENT_AMBULATORY_CARE_PROVIDER_SITE_OTHER): Payer: Self-pay | Admitting: Nurse Practitioner

## 2022-03-24 ENCOUNTER — Other Ambulatory Visit: Payer: Self-pay

## 2022-03-24 ENCOUNTER — Observation Stay
Admission: EM | Admit: 2022-03-24 | Discharge: 2022-03-25 | Disposition: A | Discharge status: Home / Self Care | Payer: Medicare Other | Attending: Internal Medicine | Admitting: Internal Medicine

## 2022-03-24 ENCOUNTER — Encounter: Payer: Self-pay | Admitting: Emergency Medicine

## 2022-03-24 DIAGNOSIS — Z7982 Long term (current) use of aspirin: Secondary | ICD-10-CM | POA: Insufficient documentation

## 2022-03-24 DIAGNOSIS — E1169 Type 2 diabetes mellitus with other specified complication: Secondary | ICD-10-CM | POA: Diagnosis not present

## 2022-03-24 DIAGNOSIS — T82590A Other mechanical complication of surgically created arteriovenous fistula, initial encounter: Secondary | ICD-10-CM | POA: Diagnosis not present

## 2022-03-24 DIAGNOSIS — Z992 Dependence on renal dialysis: Secondary | ICD-10-CM | POA: Diagnosis not present

## 2022-03-24 DIAGNOSIS — N186 End stage renal disease: Secondary | ICD-10-CM

## 2022-03-24 DIAGNOSIS — I120 Hypertensive chronic kidney disease with stage 5 chronic kidney disease or end stage renal disease: Secondary | ICD-10-CM | POA: Diagnosis not present

## 2022-03-24 DIAGNOSIS — E785 Hyperlipidemia, unspecified: Secondary | ICD-10-CM

## 2022-03-24 DIAGNOSIS — Z8673 Personal history of transient ischemic attack (TIA), and cerebral infarction without residual deficits: Secondary | ICD-10-CM | POA: Insufficient documentation

## 2022-03-24 DIAGNOSIS — T82898A Other specified complication of vascular prosthetic devices, implants and grafts, initial encounter: Secondary | ICD-10-CM | POA: Diagnosis not present

## 2022-03-24 DIAGNOSIS — E1122 Type 2 diabetes mellitus with diabetic chronic kidney disease: Secondary | ICD-10-CM | POA: Diagnosis not present

## 2022-03-24 DIAGNOSIS — Y732 Prosthetic and other implants, materials and accessory gastroenterology and urology devices associated with adverse incidents: Secondary | ICD-10-CM | POA: Diagnosis not present

## 2022-03-24 DIAGNOSIS — Z79899 Other long term (current) drug therapy: Secondary | ICD-10-CM | POA: Insufficient documentation

## 2022-03-24 LAB — BASIC METABOLIC PANEL
Anion gap: 14 (ref 5–15)
BUN: 63 mg/dL — ABNORMAL HIGH (ref 8–23)
CO2: 31 mmol/L (ref 22–32)
Calcium: 10.4 mg/dL — ABNORMAL HIGH (ref 8.9–10.3)
Chloride: 90 mmol/L — ABNORMAL LOW (ref 98–111)
Creatinine, Ser: 12.79 mg/dL — ABNORMAL HIGH (ref 0.61–1.24)
GFR, Estimated: 4 mL/min — ABNORMAL LOW (ref >60)
Glucose, Bld: 88 mg/dL (ref 70–99)
Potassium: 4.9 mmol/L (ref 3.5–5.1)
Sodium: 135 mmol/L (ref 135–145)

## 2022-03-24 LAB — CBC
HCT: 38.9 % — ABNORMAL LOW (ref 39.0–52.0)
Hemoglobin: 12.1 g/dL — ABNORMAL LOW (ref 13.0–17.0)
MCH: 31.8 pg (ref 26.0–34.0)
MCHC: 31.1 g/dL (ref 30.0–36.0)
MCV: 102.4 fL — ABNORMAL HIGH (ref 80.0–100.0)
Platelets: 129 10*3/uL — ABNORMAL LOW (ref 150–400)
RBC: 3.8 MIL/uL — ABNORMAL LOW (ref 4.22–5.81)
RDW: 15.7 % — ABNORMAL HIGH (ref 11.5–15.5)
WBC: 6.3 10*3/uL (ref 4.0–10.5)
nRBC: 0 % (ref 0.0–0.2)

## 2022-03-24 MED ORDER — PRAVASTATIN SODIUM 20 MG PO TABS
40.0000 mg | ORAL_TABLET | Freq: Every day | ORAL | Status: DC
  Administered 2022-03-24 – 2022-03-25 (×2): 40 mg via ORAL
  Filled 2022-03-24 (×2): qty 2

## 2022-03-24 MED ORDER — NIACIN ER (ANTIHYPERLIPIDEMIC) 500 MG PO TBCR
1000.0000 mg | EXTENDED_RELEASE_TABLET | Freq: Every day | ORAL | Status: DC
  Administered 2022-03-25: 1000 mg via ORAL
  Filled 2022-03-24: qty 2

## 2022-03-24 MED ORDER — HALOPERIDOL 5 MG PO TABS
5.0000 mg | ORAL_TABLET | Freq: Every morning | ORAL | Status: DC
  Administered 2022-03-25: 5 mg via ORAL
  Filled 2022-03-24: qty 1

## 2022-03-24 MED ORDER — COENZYME Q10 100 MG PO CAPS: 100.0000 mg | ORAL_CAPSULE | Freq: Every day | ORAL | Status: DC

## 2022-03-24 MED ORDER — SEVELAMER CARBONATE 800 MG PO TABS
1600.0000 mg | ORAL_TABLET | Freq: Three times a day (TID) | ORAL | Status: DC
  Administered 2022-03-25: 1600 mg via ORAL
  Filled 2022-03-24 (×4): qty 2

## 2022-03-24 MED ORDER — BOOST HIGH PROTEIN PO LIQD
1.0000 | Freq: Every day | ORAL | Status: DC
  Administered 2022-03-25: 237 mL via ORAL
  Filled 2022-03-24: qty 237

## 2022-03-24 MED ORDER — HALOPERIDOL 5 MG PO TABS
10.0000 mg | ORAL_TABLET | Freq: Every day | ORAL | Status: DC
Start: 2022-03-24 — End: 2022-03-26
  Administered 2022-03-24 – 2022-03-25 (×2): 10 mg via ORAL
  Filled 2022-03-24: qty 5
  Filled 2022-03-24: qty 2

## 2022-03-24 MED ORDER — MIRTAZAPINE 15 MG PO TABS
15.0000 mg | ORAL_TABLET | Freq: Every day | ORAL | Status: DC
  Administered 2022-03-24 – 2022-03-25 (×2): 15 mg via ORAL
  Filled 2022-03-24 (×2): qty 1

## 2022-03-24 MED ORDER — ACETAMINOPHEN 500 MG PO TABS: 500.0000 mg | ORAL_TABLET | ORAL | Status: DC | PRN

## 2022-03-24 MED ORDER — MIDODRINE HCL 5 MG PO TABS
5.0000 mg | ORAL_TABLET | ORAL | Status: DC
  Administered 2022-03-24: 5 mg via ORAL
  Filled 2022-03-24: qty 1

## 2022-03-24 MED ORDER — HEPARIN SODIUM (PORCINE) 5000 UNIT/ML IJ SOLN
5000.0000 [IU] | Freq: Three times a day (TID) | INTRAMUSCULAR | Status: DC
  Administered 2022-03-24 – 2022-03-25 (×2): 5000 [IU] via SUBCUTANEOUS
  Filled 2022-03-24 (×2): qty 1

## 2022-03-24 MED ORDER — FLUTICASONE PROPIONATE 50 MCG/ACT NA SUSP: 2.0000 | Freq: Every day | NASAL | Status: DC

## 2022-03-24 MED ORDER — BENZTROPINE MESYLATE 0.5 MG PO TABS
0.5000 mg | ORAL_TABLET | Freq: Two times a day (BID) | ORAL | Status: DC
  Administered 2022-03-24 – 2022-03-25 (×3): 0.5 mg via ORAL
  Filled 2022-03-24 (×3): qty 1

## 2022-03-24 MED ORDER — DOCUSATE SODIUM 100 MG PO CAPS: 100.0000 mg | ORAL_CAPSULE | Freq: Every day | ORAL | Status: DC | PRN

## 2022-03-24 MED ORDER — VITAMIN D 25 MCG (1000 UNIT) PO TABS
5000.0000 [IU] | ORAL_TABLET | ORAL | Status: DC
  Administered 2022-03-25: 5000 [IU] via ORAL
  Filled 2022-03-24: qty 5

## 2022-03-24 NOTE — ED Triage Notes (Signed)
Pt went to dialysis today and dialysis rn could not hear flow so they did not do dialysis. Pt has dialysis MWF and has not missed any days. ?

## 2022-03-24 NOTE — Telephone Encounter (Signed)
I received a call from [facility] regarding this patient needing to be declotted. Per Dr. Lucky Cowboy the patient is to be sent to the ED for evaluation. I spoke with Sharyn Lull and let her know to sent the patient to the ED for evaluation and that Special procedures has been informed and to send per Dr. Lucky Cowboy. ?

## 2022-03-24 NOTE — ED Provider Notes (Signed)
? ?  [facility] ?Provider Note ? ? ? Event Date/Time  ? First MD Initiated Contact with Patient 03/24/22 1555   ?  (approximate) ? ?History  ? ?Chief Complaint: Vascular Access Problem ? ?HPI ? ?Lawrence [name] is a 66 y.o. male with a past medical history of ESRD on HD Monday/Wednesday/Friday, hypertension, schizophrenia, presents to the emergency department with a clotted left upper extremity fistula.  According to the patient he receives dialysis Monday/Wednesday/Friday.  Patient had a full session on Friday, went for session today where they were unable to palpate a thrill or audible bruit in the left upper extremity fistula and they sent the patient to the emergency department for evaluation.  Here the patient appears well, no distress.  No shortness of breath.  Reassuring vitals.  Patient has no pain. ? ?Physical Exam  ? ?Triage Vital Signs: ?ED Triage Vitals  ?Enc Vitals Group  ?   BP 03/24/22 1449 135/88  ?   Pulse Rate 03/24/22 1449 76  ?   Resp 03/24/22 1449 16  ?   Temp 03/24/22 1449 98.2 ?F (36.8 ?C)  ?   Temp Source 03/24/22 1449 Oral  ?   SpO2 03/24/22 1449 97 %  ?   Weight 03/24/22 1501 160 lb (72.6 kg)  ?   Height 03/24/22 1501 '5\' 7"'$  (1.702 m)  ?   Head Circumference --   ?   Peak Flow --   ?   Pain Score 03/24/22 1501 0  ?   Pain Loc --   ?   Pain Edu? --   ?   Excl. in [place]? --   ? ? ?Most recent vital signs: ?Vitals:  ? 03/24/22 1449  ?BP: 135/88  ?Pulse: 76  ?Resp: 16  ?Temp: 98.2 ?F (36.8 ?C)  ?SpO2: 97%  ? ? ?General: Awake, no distress.  ?CV:  Good peripheral perfusion.  Regular rate and rhythm  ?Resp:  Normal effort.  Equal breath sounds bilaterally.  ?Abd:  No distention.  Soft, nontender.  No rebound or guarding. ?Other:  Left upper extremity fistula present, no bruit auscultated, no thrill palpated.  No erythema or signs of infection. ? ? ?ED Results / Procedures / Treatments  ? ? ?MEDICATIONS ORDERED IN ED: ?Medications - No data to display ? ? ?IMPRESSION / MDM /  ASSESSMENT AND PLAN / ED COURSE  ?I reviewed the triage vital signs and the nursing notes. ? ?Patient presents to the emergency department for a left upper extremity fistula that is not working properly unable to obtain dialysis today.  Overall the patient appears well, lab work is reassuring normal/baseline CBC, chemistry shows a potassium of 4.9.  Patient's work-up today is overall reassuring.  I reviewed the patient's chart, patient has been following up with Dr. Lucky Cowboy, most recently 03/13/2022 where he had an operation to remove a permacath.  I spoke to Dr. Lorenso Courier of vascular surgery who recommends admission to the hospital service and she has added the patient on for tomorrow morning for declotting procedure.  I spoke to the patient he is agreeable to this plan of care.  We will admit to the hospitalist service. ? ?FINAL CLINICAL IMPRESSION(S) / ED DIAGNOSES  ? ?Clotted left upper extremity fistula ? ?Note:  This document was prepared using Dragon voice recognition software and may include unintentional dictation errors. ?  Harvest Dark, MD ?03/24/22 1633 ? ?

## 2022-03-24 NOTE — H&P (Signed)
?History and Physical  ? ? ?Patient: Lawrence [name] MHD:[id number] DOB: 01/14/1956 ?DOA: 03/24/2022 ?DOS: the patient was seen and examined on 03/24/2022 ?PCP: Sharyne Peach, MD  ?Patient coming from: Home ? ?Chief Complaint:  ?Chief Complaint  ?Patient presents with  ? Vascular Access Problem  ? ?HPI: Lawrence [name] is a 66 y.o. male with medical history significant of ESRD on hemodialysis Monday Wednesday Friday, hypertension, schizophrenia is being admitted for clotted left upper extremity fistula.  Patient did receive full session on Friday but was unable to get dialysis today and has the right able to palpate a thrill or audible bruit in the left upper extremity fistula.  He was sent over to the emergency department.  ED provider discussed with Dr. Lorenso Courier who is planning to do declotting procedure tomorrow for which he is being admitted.  Patient denies any symptoms.  His sister is at the bedside ?Review of Systems: As mentioned in the history of present illness. All other systems reviewed and are negative. ?Past Medical History:  ?Diagnosis Date  ? Anemia   ? Cerebral hemorrhage ([place]) 2012  ? Chronic constipation   ? Chronic kidney disease   ? Diabetes mellitus without complication ([place])   ? Hemodialysis patient [facility])   ? Hyperlipidemia   ? Hypertension   ? Schizophrenia ([place])   ? Stroke [facility])   ? ?Past Surgical History:  ?Procedure Laterality Date  ? A/V FISTULAGRAM Left 02/24/2017  ? Procedure: A/V Fistulagram;  Surgeon: Katha Cabal, MD;  Location: [place] [facility];  Service: Cardiovascular;  Laterality: Left;  ? A/V FISTULAGRAM Left 09/03/2020  ? Procedure: A/V FISTULAGRAM;  Surgeon: Algernon Huxley, MD;  Location: [place] [facility];  Service: Cardiovascular;  Laterality: Left;  ? A/V FISTULAGRAM Left 06/27/2021  ? Procedure: A/V FISTULAGRAM;  Surgeon: Algernon Huxley, MD;  Location: [place] [facility];  Service: Cardiovascular;  Laterality: Left;  ? A/V FISTULAGRAM Left 07/24/2021  ? Procedure: A/V  FISTULAGRAM;  Surgeon: Algernon Huxley, MD;  Location: [place] [facility];  Service: Cardiovascular;  Laterality: Left;  ? DIALYSIS/PERMA CATHETER INSERTION N/A 12/03/2021  ? Procedure: DIALYSIS/PERMA CATHETER INSERTION;  Surgeon: Algernon Huxley, MD;  Location: [place] [facility];  Service: Cardiovascular;  Laterality: N/A;  ? DIALYSIS/PERMA CATHETER REMOVAL N/A 03/13/2022  ? Procedure: DIALYSIS/PERMA CATHETER REMOVAL;  Surgeon: Algernon Huxley, MD;  Location: [place] [facility];  Service: Cardiovascular;  Laterality: N/A;  ? PERIPHERAL VASCULAR CATHETERIZATION N/A 04/17/2015  ? Procedure: Dialysis/Perma Catheter Insertion;  Surgeon: Katha Cabal, MD;  Location: [place] [facility];  Service: Cardiovascular;  Laterality: N/A;  ? PERIPHERAL VASCULAR CATHETERIZATION Left 07/30/2015  ? Procedure: A/V Shuntogram/Fistulagram;  Surgeon: Algernon Huxley, MD;  Location: [place] [facility];  Service: Cardiovascular;  Laterality: Left;  ? PERIPHERAL VASCULAR CATHETERIZATION Left 07/30/2015  ? Procedure: A/V Shunt Intervention;  Surgeon: Algernon Huxley, MD;  Location: [place] [facility];  Service: Cardiovascular;  Laterality: Left;  ? PERIPHERAL VASCULAR CATHETERIZATION N/A 08/20/2015  ? Procedure: DIALYSIS/PERMA CATHETER REMOVAL;  Surgeon: Algernon Huxley, MD;  Location: [place] [facility];  Service: Cardiovascular;  Laterality: N/A;  ? REVISON OF ARTERIOVENOUS FISTULA Left 12/04/2021  ? Procedure: REVISON OF ARTERIOVENOUS FISTULA;  Surgeon: Algernon Huxley, MD;  Location: [facility];  Service: Vascular;  Laterality: Left;  ? THROMBECTOMY W/ EMBOLECTOMY Left 12/04/2021  ? Procedure: THROMBECTOMY ARTERIOVENOUS FISTULA;  Surgeon: Algernon Huxley, MD;  Location: [facility];  Service: Vascular;  Laterality: Left;  ? VASCULAR SURGERY    ? Fistula placement  ? ?Social History:  reports that he has never smoked. He has never used smokeless tobacco. He reports that he does not drink alcohol and does not use drugs. ? ?Allergies  ?Allergen Reactions  ?  Nsaids Other (See Comments)  ?  Cannot take due to renal disease  ? ? ?Family History  ?Problem Relation Age of Onset  ? Diabetes Mother   ? Kidney cancer Mother   ? Diabetes Sister   ? Stroke Brother   ? Prostate cancer Neg Hx   ? Bladder Cancer Neg Hx   ? ? ?Prior to Admission medications   ?Medication Sig Start Date End Date Taking? Authorizing Provider  ?acetaminophen (TYLENOL) 500 MG tablet Take 500-1,000 mg by mouth every 4 (four) hours as needed for mild pain or moderate pain.    [provider]  ?aspirin EC 81 MG tablet Take 81 mg by mouth daily.     [provider]  ?benztropine (COGENTIN) 0.5 MG tablet Take 0.5 mg by mouth 2 (two) times daily.    [provider]  ?carvedilol (COREG) 25 MG tablet Take 1 tablet (25 mg total) by mouth 2 (two) times daily with a meal. ?Patient not taking: Reported on 02/27/2022 12/05/21   [name], MD  ?cetirizine (ZYRTEC) 10 MG tablet Take 5 mg by mouth every other day.    [provider]  ?Cholecalciferol (VITAMIN D3) 125 MCG (5000 UT) CAPS Take 5,000 Units by mouth once a week.    [provider]  ?Coenzyme Q10 100 MG capsule Take 100 mg by mouth at bedtime.    [provider]  ?diphenhydrAMINE (BENADRYL) 25 mg capsule Take 25 mg by mouth See admin instructions. Take 1 capsule ('25mg'$ ) by mouth nightly as needed for sleep - may take 1 capsule ('25mg'$ ) by mouth during the daytime as needed for itching    [provider]  ?DIPHENHYDRAMINE-ZINC OXIDE EX Apply 1 application. topically See admin instructions. Apply small amount of affected area three times daily as needed    [provider]  ?docusate sodium (COLACE) 100 MG capsule Take 100 mg by mouth daily as needed for mild constipation or moderate constipation.    [provider]  ?feeding supplement (BOOST HIGH PROTEIN) LIQD Take 1 Container by mouth See admin instructions. Take 1 container by mouth three times a week or as needed of  Nutritional Supplement    [provider]  ?fluticasone (FLONASE) 50 MCG/ACT nasal spray Place 2 sprays into both nostrils daily.    [provider]  ?glipiZIDE (GLUCOTROL XL) 2.5 MG 24 hr tablet Take 2.5 mg by mouth daily with breakfast.     [provider]  ?haloperidol (HALDOL) 10 MG tablet Take 5-10 mg by mouth See admin instructions. Take 5 mg in the morning and 10 mg at bedtime    [provider]  ?lidocaine-prilocaine (EMLA) cream Apply 1 application topically 3 (three) times a week. Apply over dialysis shunt 30 minutes prior to dialysis.    [provider]  ?midodrine (PROAMATINE) 5 MG tablet Take 5 mg by mouth 3 (three) times a week. (Take 1 hour prior to dialysis)    [provider]  ?mirtazapine (REMERON) 15 MG tablet Take 15 mg by mouth at bedtime.    [provider]  ?niacin (NIASPAN) 1000 MG CR tablet Take 1,000 mg by mouth at bedtime.  [provider]  ?POLYETHYLENE GLYCOL 3350 PO Take 17 g by mouth daily as needed (Constipation). Mix 17 g (1 capful) in to 8 ounces of Juice/Water    [provider]  ?pravastatin (PRAVACHOL) 40 MG tablet Take 40 mg by mouth at bedtime.     [provider]  ?sevelamer carbonate (RENVELA) 800 MG tablet Take 2 tablets (1,600 mg total) by mouth 3 (three) times daily with meals. 12/05/21   [name], MD  ? ? ?Physical Exam: ?Vitals:  ? 03/24/22 1449 03/24/22 1501  ?BP: 135/88   ?Pulse: 76   ?Resp: 16   ?Temp: 98.2 ?F (36.8 ?C)   ?TempSrc: Oral   ?SpO2: 97%   ?Weight:  72.6 kg  ?Height:  '5\' 7"'$  (1.702 m)  ? ?66 year old male sitting in bed comfortably any acute distress ?Lungs clear to auscultation bilaterally ?Cardiovascular S1-S2 normal, no murmur rales ?Abdomen soft, benign ?Skin no rash or lesion ?Vascular access -left upper extremity fistula present with no bruit auscultated.  No erythema or signs of infection ? ?Data Reviewed: ? ?Platelets 129 ? ?Assessment and  Plan: ?ESRD on hemodialysis Monday Wednesday Friday, hypertension, schizophrenia is being admitted for clotted left upper extremity fistula. ? ?Clotted left upper extremity fistula ?Discussed with vascular su

## 2022-03-25 ENCOUNTER — Ambulatory Visit
Admission: EM | Disposition: A | Discharge status: Home / Self Care | Payer: Self-pay | Source: Home / Self Care | Attending: Emergency Medicine

## 2022-03-25 ENCOUNTER — Ambulatory Visit: Admission: RE | Admit: 2022-03-25 | Payer: Medicare Other | Source: Home / Self Care | Admitting: Vascular Surgery

## 2022-03-25 DIAGNOSIS — T82898A Other specified complication of vascular prosthetic devices, implants and grafts, initial encounter: Secondary | ICD-10-CM | POA: Diagnosis not present

## 2022-03-25 DIAGNOSIS — E1169 Type 2 diabetes mellitus with other specified complication: Secondary | ICD-10-CM | POA: Diagnosis not present

## 2022-03-25 DIAGNOSIS — N186 End stage renal disease: Secondary | ICD-10-CM

## 2022-03-25 DIAGNOSIS — T82590A Other mechanical complication of surgically created arteriovenous fistula, initial encounter: Secondary | ICD-10-CM | POA: Diagnosis not present

## 2022-03-25 DIAGNOSIS — Z992 Dependence on renal dialysis: Secondary | ICD-10-CM | POA: Diagnosis not present

## 2022-03-25 HISTORY — PX: A/V FISTULAGRAM: CATH118298

## 2022-03-25 LAB — HEPATITIS B SURFACE ANTIGEN: Hepatitis B Surface Ag: NONREACTIVE

## 2022-03-25 LAB — GLUCOSE, CAPILLARY
Glucose-Capillary: 89 mg/dL (ref 70–99)
Glucose-Capillary: 78 mg/dL (ref 70–99)

## 2022-03-25 LAB — HEPATITIS B SURFACE ANTIBODY,QUALITATIVE: Hep B S Ab: REACTIVE — AB

## 2022-03-25 SURGERY — A/V FISTULAGRAM
Anesthesia: Moderate Sedation | Laterality: Left

## 2022-03-25 MED ORDER — METHYLPREDNISOLONE SODIUM SUCC 125 MG IJ SOLR: 125.0000 mg | Freq: Once | INTRAMUSCULAR | Status: DC | PRN

## 2022-03-25 MED ORDER — ALTEPLASE 2 MG IJ SOLR
INTRAMUSCULAR | Status: DC | PRN
  Administered 2022-03-25: 4 mg

## 2022-03-25 MED ORDER — SODIUM CHLORIDE 0.9 % IV SOLN: 100.0000 mL | INTRAVENOUS | Status: DC | PRN

## 2022-03-25 MED ORDER — HEPARIN SODIUM (PORCINE) 1000 UNIT/ML DIALYSIS: 1000.0000 [IU] | INTRAMUSCULAR | Status: DC | PRN

## 2022-03-25 MED ORDER — IODIXANOL 320 MG/ML IV SOLN
INTRAVENOUS | Status: DC | PRN
  Administered 2022-03-25: 25 mL via INTRAVENOUS

## 2022-03-25 MED ORDER — PENTAFLUOROPROP-TETRAFLUOROETH EX AERO
INHALATION_SPRAY | CUTANEOUS | Status: AC
  Filled 2022-03-25: qty 30

## 2022-03-25 MED ORDER — MIDAZOLAM HCL 2 MG/ML PO SYRP
8.0000 mg | ORAL_SOLUTION | Freq: Once | ORAL | Status: DC | PRN
Start: 2022-03-25 — End: 2022-03-25

## 2022-03-25 MED ORDER — CHLORHEXIDINE GLUCONATE CLOTH 2 % EX PADS: 6.0000 | MEDICATED_PAD | Freq: Every day | CUTANEOUS | Status: DC

## 2022-03-25 MED ORDER — ONDANSETRON HCL 4 MG/2ML IJ SOLN: 4.0000 mg | Freq: Four times a day (QID) | INTRAMUSCULAR | Status: DC | PRN

## 2022-03-25 MED ORDER — HYDROMORPHONE HCL 1 MG/ML IJ SOLN: 1.0000 mg | Freq: Once | INTRAMUSCULAR | Status: DC | PRN

## 2022-03-25 MED ORDER — HEPARIN SODIUM (PORCINE) 1000 UNIT/ML IJ SOLN
INTRAMUSCULAR | Status: DC | PRN
  Administered 2022-03-25: 2000 [IU] via INTRAVENOUS

## 2022-03-25 MED ORDER — DIPHENHYDRAMINE HCL 50 MG/ML IJ SOLN: 50.0000 mg | Freq: Once | INTRAMUSCULAR | Status: DC | PRN

## 2022-03-25 MED ORDER — HEPARIN SODIUM (PORCINE) 1000 UNIT/ML IJ SOLN
INTRAMUSCULAR | Status: AC
  Filled 2022-03-25: qty 10

## 2022-03-25 MED ORDER — LIDOCAINE HCL (PF) 1 % IJ SOLN
5.0000 mL | INTRAMUSCULAR | Status: DC | PRN
  Filled 2022-03-25: qty 5

## 2022-03-25 MED ORDER — CEFAZOLIN SODIUM-DEXTROSE 1-4 GM/50ML-% IV SOLN
1.0000 g | INTRAVENOUS | Status: AC
  Administered 2022-03-25: 1 g via INTRAVENOUS

## 2022-03-25 MED ORDER — SODIUM CHLORIDE 0.9 % IV SOLN
INTRAVENOUS | Status: DC
Start: 2022-03-25 — End: 2022-03-25

## 2022-03-25 MED ORDER — PENTAFLUOROPROP-TETRAFLUOROETH EX AERO: 1.0000 "application " | INHALATION_SPRAY | CUTANEOUS | Status: DC | PRN

## 2022-03-25 MED ORDER — FENTANYL CITRATE (PF) 100 MCG/2ML IJ SOLN
INTRAMUSCULAR | Status: DC | PRN
  Administered 2022-03-25 (×2): 25 ug via INTRAVENOUS

## 2022-03-25 MED ORDER — FAMOTIDINE 20 MG PO TABS: 40.0000 mg | ORAL_TABLET | Freq: Once | ORAL | Status: DC | PRN

## 2022-03-25 MED ORDER — ALTEPLASE 2 MG IJ SOLR: 2.0000 mg | Freq: Once | INTRAMUSCULAR | Status: DC | PRN

## 2022-03-25 MED ORDER — FENTANYL CITRATE PF 50 MCG/ML IJ SOSY
PREFILLED_SYRINGE | INTRAMUSCULAR | Status: AC
  Filled 2022-03-25: qty 1

## 2022-03-25 MED ORDER — MIDAZOLAM HCL 2 MG/2ML IJ SOLN
INTRAMUSCULAR | Status: DC | PRN
  Administered 2022-03-25 (×2): 1 mg via INTRAVENOUS

## 2022-03-25 MED ORDER — MIDAZOLAM HCL 2 MG/2ML IJ SOLN
INTRAMUSCULAR | Status: AC
  Filled 2022-03-25: qty 2

## 2022-03-25 MED ORDER — LIDOCAINE-PRILOCAINE 2.5-2.5 % EX CREA: 1.0000 "application " | TOPICAL_CREAM | CUTANEOUS | Status: DC | PRN

## 2022-03-25 SURGICAL SUPPLY — 15 items
BALLN DORADO 8X40X80 (BALLOONS) ×2
BALLOON DORADO 8X40X80 (BALLOONS) IMPLANT
CANISTER PENUMBRA ENGINE (MISCELLANEOUS) ×1 IMPLANT
CANNULA 5F STIFF (CANNULA) ×1 IMPLANT
CATH INDIGO 7D KIT (CATHETERS) ×1 IMPLANT
CATH KUMPE SOFT-VU 5FR 65 (CATHETERS) ×1 IMPLANT
COVER PROBE U/S 5X48 (MISCELLANEOUS) ×1 IMPLANT
DRAPE BRACHIAL (DRAPES) ×1 IMPLANT
GAUZE SPONGE 4X4 12PLY STRL (GAUZE/BANDAGES/DRESSINGS) ×3 IMPLANT
GLIDEWIRE ADV .035X180CM (WIRE) ×2 IMPLANT
KIT ENCORE 26 ADVANTAGE (KITS) ×1 IMPLANT
PACK ANGIOGRAPHY (CUSTOM PROCEDURE TRAY) ×1 IMPLANT
SHEATH BRITE TIP 6FRX5.5 (SHEATH) ×2 IMPLANT
SHEATH BRITE TIP 7FRX5.5 (SHEATH) ×2 IMPLANT
WIRE NITINOL .018 (WIRE) ×1 IMPLANT

## 2022-03-25 NOTE — Consult Note (Signed)
?[facility] ?Vascular Consult Note ? ?MRN : [id number] ? ?Lawrence [name] is a 66 y.o. (04-03-1956) male who presents with chief complaint of  ?Chief Complaint  ?Patient presents with  ? Vascular Access Problem  ?. ? ?History of Present Illness: Patient known to service with end-stage renal disease status post multiple catheter placements for such.  He is recently status post a left AV fistula aneurysm revision and placement of a Artegraft.  It had been working well and we recently removed his permacath.  He presents after attempting hemodialysis and they found that the graft was clotted.  His last hemodialysis session was last Wednesday. ? ?Current Facility-Administered Medications  ?Medication Dose Route Frequency Provider Last Rate Last Admin  ? 0.9 %  sodium chloride infusion   Intravenous Continuous Kris Hartmann, NP 10 mL/hr at 03/25/22 0719 New Bag at 03/25/22 0719  ? [MAR Hold] acetaminophen (TYLENOL) tablet 500-1,000 mg  500-1,000 mg Oral Q4H PRN Max Sane, MD      ? Doug Sou Hold] benztropine (COGENTIN) tablet 0.5 mg  0.5 mg Oral BID Max Sane, MD   0.5 mg at 03/24/22 2209  ? ceFAZolin (ANCEF) IVPB 1 g/50 mL premix  1 g Intravenous 30 min Pre-Op Kris Hartmann, NP      ? [MAR Hold] cholecalciferol (VITAMIN D3) tablet 5,000 Units  5,000 Units Oral Weekly Manuella Ghazi, Vipul, MD      ? diphenhydrAMINE (BENADRYL) injection 50 mg  50 mg Intravenous Once PRN Kris Hartmann, NP      ? [MAR Hold] docusate sodium (COLACE) capsule 100 mg  100 mg Oral Daily PRN Max Sane, MD      ? famotidine (PEPCID) tablet 40 mg  40 mg Oral Once PRN Kris Hartmann, NP      ? [MAR Hold] feeding supplement (BOOST HIGH PROTEIN) liquid 237 mL  1 Container Oral Daily Max Sane, MD      ? Doug Sou Hold] fluticasone (FLONASE) 50 MCG/ACT nasal spray 2 spray  2 spray Each Nare Daily Max Sane, MD      ? Doug Sou Hold] haloperidol (HALDOL) tablet 10 mg  10 mg Oral QHS Dorothe Pea, RPH   10 mg at 03/24/22 2209  ?  [MAR Hold] haloperidol (HALDOL) tablet 5 mg  5 mg Oral q morning Max Sane, MD      ? [facility] Hold] heparin injection 5,000 Units  5,000 Units Subcutaneous Q8H Max Sane, MD   5,000 Units at 03/24/22 2209  ? [MAR Hold] HYDROmorphone (DILAUDID) injection 1 mg  1 mg Intravenous Once PRN Kris Hartmann, NP      ? methylPREDNISolone sodium succinate (SOLU-MEDROL) 125 mg/2 mL injection 125 mg  125 mg Intravenous Once PRN Kris Hartmann, NP      ? midazolam (VERSED) 2 MG/ML syrup 8 mg  8 mg Oral Once PRN Kris Hartmann, NP      ? [MAR Hold] midodrine (PROAMATINE) tablet 5 mg  5 mg Oral Once per day on Mon Wed Fri Max Sane, MD   5 mg at 03/24/22 2017  ? [MAR Hold] mirtazapine (REMERON) tablet 15 mg  15 mg Oral QHS Max Sane, MD   15 mg at 03/24/22 2210  ? [MAR Hold] niacin (NIASPAN) CR tablet 1,000 mg  1,000 mg Oral QHS Max Sane, MD      ? Doug Sou Hold] ondansetron [facility]) injection 4 mg  4 mg Intravenous Q6H PRN Kris Hartmann, NP      ? [  MAR Hold] pravastatin (PRAVACHOL) tablet 40 mg  40 mg Oral QHS Max Sane, MD   40 mg at 03/24/22 2209  ? [MAR Hold] sevelamer carbonate (RENVELA) tablet 1,600 mg  1,600 mg Oral TID WC Max Sane, MD      ? ? ?Past Medical History:  ?Diagnosis Date  ? Anemia   ? Cerebral hemorrhage ([place]) 2012  ? Chronic constipation   ? Chronic kidney disease   ? Diabetes mellitus without complication ([place])   ? Hemodialysis patient [facility])   ? Hyperlipidemia   ? Hypertension   ? Schizophrenia ([place])   ? Stroke [facility])   ? ? ?Past Surgical History:  ?Procedure Laterality Date  ? A/V FISTULAGRAM Left 02/24/2017  ? Procedure: A/V Fistulagram;  Surgeon: Katha Cabal, MD;  Location: [place] [facility];  Service: Cardiovascular;  Laterality: Left;  ? A/V FISTULAGRAM Left 09/03/2020  ? Procedure: A/V FISTULAGRAM;  Surgeon: Algernon Huxley, MD;  Location: [place] [facility];  Service: Cardiovascular;  Laterality: Left;  ? A/V FISTULAGRAM Left 06/27/2021  ? Procedure: A/V FISTULAGRAM;  Surgeon: Algernon Huxley, MD;  Location: [place] [facility];  Service: Cardiovascular;  Laterality: Left;  ? A/V FISTULAGRAM Left 07/24/2021  ? Procedure: A/V FISTULAGRAM;  Surgeon: Algernon Huxley, MD;  Location: [place] [facility];  Service: Cardiovascular;  Laterality: Left;  ? DIALYSIS/PERMA CATHETER INSERTION N/A 12/03/2021  ? Procedure: DIALYSIS/PERMA CATHETER INSERTION;  Surgeon: Algernon Huxley, MD;  Location: [place] [facility];  Service: Cardiovascular;  Laterality: N/A;  ? DIALYSIS/PERMA CATHETER REMOVAL N/A 03/13/2022  ? Procedure: DIALYSIS/PERMA CATHETER REMOVAL;  Surgeon: Algernon Huxley, MD;  Location: [place] [facility];  Service: Cardiovascular;  Laterality: N/A;  ? PERIPHERAL VASCULAR CATHETERIZATION N/A 04/17/2015  ? Procedure: Dialysis/Perma Catheter Insertion;  Surgeon: Katha Cabal, MD;  Location: [place] [facility];  Service: Cardiovascular;  Laterality: N/A;  ? PERIPHERAL VASCULAR CATHETERIZATION Left 07/30/2015  ? Procedure: A/V Shuntogram/Fistulagram;  Surgeon: Algernon Huxley, MD;  Location: [place] [facility];  Service: Cardiovascular;  Laterality: Left;  ? PERIPHERAL VASCULAR CATHETERIZATION Left 07/30/2015  ? Procedure: A/V Shunt Intervention;  Surgeon: Algernon Huxley, MD;  Location: [place] [facility];  Service: Cardiovascular;  Laterality: Left;  ? PERIPHERAL VASCULAR CATHETERIZATION N/A 08/20/2015  ? Procedure: DIALYSIS/PERMA CATHETER REMOVAL;  Surgeon: Algernon Huxley, MD;  Location: [place] [facility];  Service: Cardiovascular;  Laterality: N/A;  ? REVISON OF ARTERIOVENOUS FISTULA Left 12/04/2021  ? Procedure: REVISON OF ARTERIOVENOUS FISTULA;  Surgeon: Algernon Huxley, MD;  Location: [facility];  Service: Vascular;  Laterality: Left;  ? THROMBECTOMY W/ EMBOLECTOMY Left 12/04/2021  ? Procedure: THROMBECTOMY ARTERIOVENOUS FISTULA;  Surgeon: Algernon Huxley, MD;  Location: [facility];  Service: Vascular;  Laterality: Left;  ? VASCULAR SURGERY    ? Fistula placement  ? ? ?Social History ?Social History  ? ?Tobacco  Use  ? Smoking status: Never  ? Smokeless tobacco: Never  ?Substance Use Topics  ? Alcohol use: No  ? Drug use: No  ? ? ?Family History ?Family History  ?Problem Relation Age of Onset  ? Diabetes Mother   ? Kidney cancer Mother   ? Diabetes Sister   ? Stroke Brother   ? Prostate cancer Neg Hx   ? Bladder Cancer Neg Hx   ? ? ?Allergies  ?Allergen Reactions  ? Nsaids Other (See Comments)  ?  Cannot take due to renal disease  ? ? ? ?REVIEW  OF SYSTEMS (Negative unless checked) ? ?Constitutional: '[]'$ Weight loss  '[]'$ Fever  '[]'$ Chills ?Cardiac: '[]'$ Chest pain   '[]'$ Chest pressure   '[]'$ Palpitations   '[]'$ Shortness of breath when laying flat   '[]'$ Shortness of breath at rest   '[]'$ Shortness of breath with exertion. ?Vascular:  '[]'$ Pain in legs with walking   '[]'$ Pain in legs at rest   '[]'$ Pain in legs when laying flat   '[]'$ Claudication   '[]'$ Pain in feet when walking  '[]'$ Pain in feet at rest  '[]'$ Pain in feet when laying flat   '[]'$ History of DVT   '[]'$ Phlebitis   '[]'$ Swelling in legs   '[]'$ Varicose veins   '[]'$ Non-healing ulcers ?Pulmonary:   '[]'$ Uses home oxygen   '[]'$ Productive cough   '[]'$ Hemoptysis   '[]'$ Wheeze  '[]'$ COPD   '[]'$ Asthma ?Neurologic:  '[]'$ Dizziness  '[]'$ Blackouts   '[]'$ Seizures   '[]'$ History of stroke   '[]'$ History of TIA  '[]'$ Aphasia   '[]'$ Temporary blindness   '[]'$ Dysphagia   '[]'$ Weakness or numbness in arms   '[]'$ Weakness or numbness in legs ?Musculoskeletal:  '[]'$ Arthritis   '[]'$ Joint swelling   '[]'$ Joint pain   '[]'$ Low back pain ?Hematologic:  '[]'$ Easy bruising  '[]'$ Easy bleeding   '[]'$ Hypercoagulable state   '[]'$ Anemic  '[]'$ Hepatitis ?Gastrointestinal:  '[]'$ Blood in stool   '[]'$ Vomiting blood  '[]'$ Gastroesophageal reflux/heartburn   '[]'$ Difficulty swallowing. ?Genitourinary:  '[x]'$ Chronic kidney disease   '[]'$ Difficult urination  '[]'$ Frequent urination  '[]'$ Burning with urination   '[]'$ Blood in urine ?Skin:  '[]'$ Rashes   '[]'$ Ulcers   '[]'$ Wounds ?Psychological:  '[]'$ History of anxiety   '[]'$  History of major depression. ? ?Physical Examination ? ?Vitals:  ? 03/25/22 0240 03/25/22 0549 03/25/22 0630 03/25/22 0711   ?BP: 133/88 (!) 132/93 139/82 124/87  ?Pulse: 87 72 85 79  ?Resp: '20 11 20 '$ (!) 22  ?Temp: 98 ?F (36.7 ?C) 97.9 ?F (36.6 ?C) 98.4 ?F (36.9 ?C) 97.9 ?F (36.6 ?C)  ?TempSrc: Oral Oral  Oral  ?SpO2: 100% 100% 10

## 2022-03-25 NOTE — TOC Transition Note (Signed)
Transition of Care (TOC) - CM/SW Discharge Note ? ? ?Patient Details  ?Name: Lawrence [name] ?MRN: [id number] ?Date of Birth: [birth year] ? ?Transition of Care (TOC) CM/SW Contact:  ?Candie Chroman, LCSW ?Phone Number: ?03/25/2022, 2:54 PM ? ? ?Clinical Narrative:   Patient has orders to discharge back to [facility] today. Faxed discharge summary to facility. They will pick him up after HD. No further concerns. CSW signing off. ? ?Final next level of care: Group Home ?Barriers to Discharge: No Barriers Identified ? ? ?Patient Goals and CMS Choice ?  ?  ?  ? ?Discharge Placement ?  ?           ?  ?Patient to be transferred to facility by: Group home staff will pick him up ?Name of family member notified: Dorna Bloom ?Patient and family notified of of transfer: 03/25/22 ? ?Discharge Plan and Services ?  ?  ?Post Acute Care Choice: NA          ?  ?  ?  ?  ?  ?  ?  ?  ?  ?  ? ?Social Determinants of Health (SDOH) Interventions ?  ? ? ?Readmission Risk Interventions ?   ? View : No data to display.  ?  ?  ?  ? ? ? ? ? ?

## 2022-03-25 NOTE — Progress Notes (Signed)
?[facility]  ?ROUNDING NOTE  ? ?Subjective:  ? ?Lawrence [name] is a 66 year old male with past medical histories including hypertension, schizophrenia, and end-stage renal disease on hemodialysis.  Patient presents to the hospital for a clotted dialysis access.  Patient was admitted under observation for ESRD on dialysis [facility]) [N18.6, Z99.2] ?Dialysis AV fistula malfunction ([place]) [Y30.160F] ?Arteriovenous fistula occlusion, initial encounter ([place]) [T82.898A] ? ?Patient is known to our practice and receives outpatient dialysis treatments at [facility] on a MWF schedule, supervised by Dr. Juleen China.  Patient was unable to receive dialysis yesterday at outpatient clinic due to clotted access.  He was encouraged to present to the emergency department.  Patient reports access has been troublesome for past few months.  He states he has maintained his outpatient dialysis schedule prior to missed treatment Monday.  Denies shortness of breath.  Denies nausea, vomiting, or diarrhea.  Denies pain or discomfort of any kind. ? ?Labs on arrival stable for renal patient.  Vascular notified by ED provider of clotted access.  We have been consulted to manage dialysis needs during this admission. ? ? ?Objective:  ?Vital signs in last 24 hours:  ?Temp:  [97.8 ?F (36.6 ?C)-98.4 ?F (36.9 ?C)] 97.9 ?F (36.6 ?C) (04/18 0932) ?Pulse Rate:  [72-90] 89 (04/18 0915) ?Resp:  [8-22] 17 (04/18 0915) ?BP: (117-144)/(78-100) 117/78 (04/18 0915) ?SpO2:  [97 %-100 %] 100 % (04/18 0915) ?Weight:  [72.6 kg] 72.6 kg (04/17 1501) ? ?Weight change:  ?Filed Weights  ? 03/24/22 1501  ?Weight: 72.6 kg  ? ? ?Intake/Output: ?No intake/output data recorded. ?  ?Intake/Output this shift: ? Total I/O ?In: 140 [P.O.:140] ?Out: -  ? ?Physical Exam: ?General: NAD, resting comfortably  ?Head: Normocephalic, atraumatic. Moist oral mucosal membranes  ?Eyes: Anicteric  ?Lungs:  Clear to auscultation, normal effort, room air  ?Heart: Regular rate  and rhythm  ?Abdomen:  Soft, nontender, nondistended  ?Extremities: No peripheral edema.  ?Neurologic: Nonfocal, moving all four extremities  ?Skin: No lesions  ?Access: Left upper aVF  ? ? ?Basic Metabolic Panel: ?Recent Labs  ?Lab 03/24/22 ?1454  ?NA 135  ?K 4.9  ?CL 90*  ?CO2 31  ?GLUCOSE 88  ?BUN 63*  ?CREATININE 12.79*  ?CALCIUM 10.4*  ? ? ?Liver Function Tests: ?No results for input(s): AST, ALT, ALKPHOS, BILITOT, PROT, ALBUMIN in the last 168 hours. ?No results for input(s): LIPASE, AMYLASE in the last 168 hours. ?No results for input(s): AMMONIA in the last 168 hours. ? ?CBC: ?Recent Labs  ?Lab 03/24/22 ?1454  ?WBC 6.3  ?HGB 12.1*  ?HCT 38.9*  ?MCV 102.4*  ?PLT 129*  ? ? ?Cardiac Enzymes: ?No results for input(s): CKTOTAL, CKMB, CKMBINDEX, TROPONINI in the last 168 hours. ? ?BNP: ?Invalid input(s): POCBNP ? ?CBG: ?Recent Labs  ?Lab 03/25/22 ?0645 03/25/22 ?3557  ?GLUCAP 89 78  ? ? ?Microbiology: ?Results for orders placed or performed during the hospital encounter of 11/29/21  ?Resp Panel by RT-PCR (Flu A&B, Covid) Nasopharyngeal Swab     Status: None  ? Collection Time: 11/29/21  6:45 PM  ? Specimen: Nasopharyngeal Swab; Nasopharyngeal(NP) swabs in vial transport medium  ?Result Value Ref Range Status  ? SARS Coronavirus 2 by RT PCR NEGATIVE NEGATIVE Final  ?  Comment: (NOTE) ?SARS-CoV-2 target nucleic acids are NOT DETECTED. ? ?The SARS-CoV-2 RNA is generally detectable in upper respiratory ?specimens during the acute phase of infection. The lowest ?concentration of SARS-CoV-2 viral copies this assay can detect is ?138 copies/mL. A negative  result does not preclude SARS-Cov-2 ?infection and should not be used as the sole basis for treatment or ?other patient management decisions. A negative result may occur with  ?improper specimen collection/handling, submission of specimen other ?than nasopharyngeal swab, presence of viral mutation(s) within the ?areas targeted by this assay, and inadequate number of  viral ?copies(<138 copies/mL). A negative result must be combined with ?clinical observations, patient history, and epidemiological ?information. The expected result is Negative. ? ?Fact Sheet for Patients:  ?EntrepreneurPulse.com.au ? ?Fact Sheet for Healthcare Providers:  ?IncredibleEmployment.be ? ?This test is no t yet approved or cleared by the [place] FDA and  ?has been authorized for detection and/or diagnosis of SARS-CoV-2 by ?FDA under an Emergency Use Authorization (EUA). This EUA will remain  ?in effect (meaning this test can be used) for the duration of the ?COVID-19 declaration under Section 564(b)(1) of the Act, 21 ?U.S.C.section 360bbb-3(b)(1), unless the authorization is terminated  ?or revoked sooner.  ? ? ?  ? Influenza A by PCR NEGATIVE NEGATIVE Final  ? Influenza B by PCR NEGATIVE NEGATIVE Final  ?  Comment: (NOTE) ?The Xpert Xpress SARS-CoV-2/FLU/RSV plus assay is intended as an aid ?in the diagnosis of influenza from Nasopharyngeal swab specimens and ?should not be used as a sole basis for treatment. Nasal washings and ?aspirates are unacceptable for Xpert Xpress SARS-CoV-2/FLU/RSV ?testing. ? ?Fact Sheet for Patients: ?EntrepreneurPulse.com.au ? ?Fact Sheet for Healthcare Providers: ?IncredibleEmployment.be ? ?This test is not yet approved or cleared by the [place] FDA and ?has been authorized for detection and/or diagnosis of SARS-CoV-2 by ?FDA under an Emergency Use Authorization (EUA). This EUA will remain ?in effect (meaning this test can be used) for the duration of the ?COVID-19 declaration under Section 564(b)(1) of the Act, 21 U.S.C. ?section 360bbb-3(b)(1), unless the authorization is terminated or ?revoked. ? ?Performed at [facility], [place], ?[place] [address] ?  ? ? ?Coagulation Studies: ?No results for input(s): LABPROT, INR in the last 72 hours. ? ?Urinalysis: ?No  results for input(s): COLORURINE, LABSPEC, [place], GLUCOSEU, HGBUR, BILIRUBINUR, KETONESUR, PROTEINUR, UROBILINOGEN, NITRITE, LEUKOCYTESUR in the last 72 hours. ? ?Invalid input(s): APPERANCEUR  ? ? ?Imaging: ?PERIPHERAL VASCULAR CATHETERIZATION ? ?Result Date: 03/25/2022 ?See surgical note for result.  ? ? ?Medications:  ? ? ? benztropine  0.5 mg Oral BID  ? [START ON 03/26/2022] Chlorhexidine Gluconate Cloth  6 each Topical Q0600  ? cholecalciferol  5,000 Units Oral Weekly  ? feeding supplement  1 Container Oral Daily  ? fentaNYL      ? fluticasone  2 spray Each Nare Daily  ? haloperidol  10 mg Oral QHS  ? haloperidol  5 mg Oral q morning  ? heparin  5,000 Units Subcutaneous Q8H  ? heparin sodium (porcine)      ? midazolam      ? midodrine  5 mg Oral Once per day on Mon Wed Fri  ? mirtazapine  15 mg Oral QHS  ? niacin  1,000 mg Oral QHS  ? pravastatin  40 mg Oral QHS  ? sevelamer carbonate  1,600 mg Oral TID WC  ? ?acetaminophen, docusate sodium, HYDROmorphone (DILAUDID) injection, ondansetron (ZOFRAN) IV ? ?Assessment/ Plan:  ?Mr. THERESA DOHRMAN is a 66 y.o.  male  with past medical histories including hypertension, schizophrenia, and end-stage renal disease on hemodialysis.  Patient presents to the hospital for a clotted dialysis access.  Patient was admitted under observation for ESRD on dialysis [facility]) [N18.6, Z99.2] ?Dialysis  AV fistula malfunction ([place]) [B70.488Q] ?Arteriovenous fistula occlusion, initial encounter ([place]) [T82.898A] ? ? ?[facility] MWF [facility]/ Left AVF/ 73.5kg ? ?Malfunctioning dialysis access with end-stage renal disease on hemodialysis.  Unable to perform outpatient today.  Appreciate vascular surgery managing thrombosed left brachiocephalic AV graft with 91% stenosis.  Vascular states okay available for use with next dialysis treatment.  Plan to dialyze patient later today using fistula.  If successful, patient cleared to discharge from renal stance to continue outpatient treatments  at assigned clinic. ? ?2. Anemia of chronic kidney disease ? Macrocytic ?Lab Results  ?Component Value Date  ? HGB 12.1 (L) 03/24/2022  ?  ?Hemoglobin remains within acceptable range. ?No need for ESA at this tim

## 2022-03-25 NOTE — Op Note (Signed)
[facility] ? ? ? ?OPERATIVE NOTE ? ? ?PROCEDURE: ?1.  LEFT Brachiocephalic arteriovenous graft cannulation under ultrasound guidance in both a retrograde and then antegrade fashion crossing ?2.  LEFT arm shuntogram and central venogram ?3.  Catheter directed thrombolysis with 4 mg of TPA  ?4.  Mechanical thrombectomy to the LEFT Brachiocephalic graft with the 7Cat pneumbra device ?5.  Percutaneous transluminal angioplasty of the LEFT Brachiocephalic graft with a 8 x 40 Dorado ? ?PRE-OPERATIVE DIAGNOSIS: 1. ESRD ?2.  Thrombosed LEFT arteriovenous graft ? ?POST-OPERATIVE DIAGNOSIS: same as above  ? ?SURGEON: Jamesetta So, MD ? ?ANESTHESIA: local with Moderate Conscious Sedation for approximately 38 minutes using 2 mg of Versed and 50 mcg of Fentanyl ? ?ESTIMATED BLOOD LOSS: 10 cc ? ?FINDING(S): ?Thrombosed LEFT Brachiocephalic AV graft, midgraft stenosis ~40% ? ?SPECIMEN(S):  None ? ?CONTRAST: 25 cc ? ?FLUORO TIME:  1.5 minutes ? ?INDICATIONS: ?Patient is a 66 y.o.male who presents with a thrombosed LEFT arteriovenous graft.  The patient is scheduled for an attempted declot and shuntogram.  The patient is aware the risks include but are not limited to: bleeding, infection, thrombosis of the cannulated access, and possible anaphylactic reaction to the contrast.  The patient is aware of the risks of the procedure and elects to proceed forward. ? ?DESCRIPTION: ?After full informed written consent was obtained, the patient was brought back to the angiography suite and placed supine upon the angiography table.  The patient was connected to monitoring equipment. Moderate conscious sedation was administered with a face to face encounter with the patient throughout the procedure with my supervision of the RN administering medicines and monitoring the patient's vital signs, pulse oximetry, telemetry and mental status throughout from the start of the procedure until the patient was taken to the recovery  room. The LEFT arm was prepped and draped in the standard fashion for a percutaneous access intervention.  Under ultrasound guidance, the LEFT arteriovenous graft was cannulated with a micropuncture needle under direct ultrasound guidance due to the pulseless nature of the graft in both an antegrade and a retrograde fashion crossing, and permanent images were performed.  The microwire was advanced and the needle was exchanged for the a microsheath.  I then upsized to a 6 Fr Sheath and imaging was performed.  Hand injections were completed to image the access including the central venous system. This demonstrated no flow within the AV graft.  Based on the images, this patient will need extensive treatment to salvage the graft. I then gave the patient 2000 units of intravenous heparin. ? ?I then placed a Magic torque wire into the brachial artery from the retrograde sheath and into the cephalic vein from the antegrade sheath. 4 mg of TPA were deployed. This was allowed to dwell. Mechanical thrombectomy using the 7CAT pneumbra was then performed throughout the graft and into the cephalic vein. This uncovered 40% midgraft stenosis.  A residual arterial plug was also seen at the arterial anastomosis. The arterial outflow was seen to be intact distally. The retrograde sheath was removed. I then turned my attention to the thrombus in the distal graft and the cephalic vein. Mechanical thrombectomy was performed.  I then elected to treat this with angioplasty of the midgraft stenosis using a 8 x 40 dorado balloon. There was less than 5% residual stenosis. ? ? ?Based on the completion imaging, no further intervention is necessary.  The wire and balloon were removed from the sheath.  A 4-0 Monocryl purse-string suture  was sewn around the sheath.  The sheath was removed while tying down the suture.  A sterile bandage was applied to the puncture site. ? ?COMPLICATIONS: None ? ?CONDITION: Stable ? ? ?Jamesetta So  A ?03/25/2022 ?8:49 AM ? ? ?This note was created with Dragon Medical transcription system. Any errors in dictation are purely unintentional.  ?

## 2022-03-25 NOTE — H&P (View-Only) (Signed)
?[facility] ?Vascular Consult Note ? ?MRN : [id number] ? ?Lawrence [name] is a 66 y.o. (Mar 27, 1956) male who presents with chief complaint of  ?Chief Complaint  ?Patient presents with  ? Vascular Access Problem  ?. ? ?History of Present Illness: Patient known to service with end-stage renal disease status post multiple catheter placements for such.  He is recently status post a left AV fistula aneurysm revision and placement of a Artegraft.  It had been working well and we recently removed his permacath.  He presents after attempting hemodialysis and they found that the graft was clotted.  His last hemodialysis session was last Wednesday. ? ?Current Facility-Administered Medications  ?Medication Dose Route Frequency Provider Last Rate Last Admin  ? 0.9 %  sodium chloride infusion   Intravenous Continuous Kris Hartmann, NP 10 mL/hr at 03/25/22 0719 New Bag at 03/25/22 0719  ? [MAR Hold] acetaminophen (TYLENOL) tablet 500-1,000 mg  500-1,000 mg Oral Q4H PRN Max Sane, MD      ? [facility] Hold] benztropine (COGENTIN) tablet 0.5 mg  0.5 mg Oral BID Max Sane, MD   0.5 mg at 03/24/22 2209  ? ceFAZolin (ANCEF) IVPB 1 g/50 mL premix  1 g Intravenous 30 min Pre-Op Kris Hartmann, NP      ? [MAR Hold] cholecalciferol (VITAMIN D3) tablet 5,000 Units  5,000 Units Oral Weekly Manuella Ghazi, Vip[place], MD      ? diphenhydrAMINE (BENADRYL) injection 50 mg  50 mg Intravenous Once PRN Kris Hartmann, NP      ? [MAR Hold] docusate sodium (COLACE) capsule 100 mg  100 mg Oral Daily PRN Max Sane, MD      ? famotidine (PEPCID) tablet 40 mg  40 mg Oral Once PRN Kris Hartmann, NP      ? [MAR Hold] feeding supplement (BOOST HIGH PROTEIN) liquid 237 mL  1 Container Oral Daily Max Sane, MD      ? [facility] Hold] fluticasone (FLONASE) 50 MCG/ACT nasal spray 2 spray  2 spray Each Nare Daily Max Sane, MD      ? Doug Sou Hold] haloperidol (HALDOL) tablet 10 mg  10 mg Oral QHS Dorothe Pea, RPH   10 mg at 03/24/22 2209  ?  [MAR Hold] haloperidol (HALDOL) tablet 5 mg  5 mg Oral q morning Max Sane, MD      ? [facility] Hold] heparin injection 5,000 Units  5,000 Units Subcutaneous Q8H Max Sane, MD   5,000 Units at 03/24/22 2209  ? [MAR Hold] HYDROmorphone (DILAUDID) injection 1 mg  1 mg Intravenous Once PRN Kris Hartmann, NP      ? methylPREDNISolone sodium succinate (SOLU-MEDROL) 125 mg/2 mL injection 125 mg  125 mg Intravenous Once PRN Kris Hartmann, NP      ? midazolam (VERSED) 2 MG/ML syrup 8 mg  8 mg Oral Once PRN Kris Hartmann, NP      ? [MAR Hold] midodrine (PROAMATINE) tablet 5 mg  5 mg Oral Once per day on Mon Wed Fri Max Sane, MD   5 mg at 03/24/22 2017  ? [MAR Hold] mirtazapine (REMERON) tablet 15 mg  15 mg Oral QHS Max Sane, MD   15 mg at 03/24/22 2210  ? [MAR Hold] niacin (NIASPAN) CR tablet 1,000 mg  1,000 mg Oral QHS Max Sane, MD      ? Doug Sou Hold] ondansetron [facility]) injection 4 mg  4 mg Intravenous Q6H PRN Kris Hartmann, NP      ? [  MAR Hold] pravastatin (PRAVACHOL) tablet 40 mg  40 mg Oral QHS Max Sane, MD   40 mg at 03/24/22 2209  ? [MAR Hold] sevelamer carbonate (RENVELA) tablet 1,600 mg  1,600 mg Oral TID WC Max Sane, MD      ? ? ?Past Medical History:  ?Diagnosis Date  ? Anemia   ? Cerebral hemorrhage ([place]) 2012  ? Chronic constipation   ? Chronic kidney disease   ? Diabetes mellitus without complication ([place])   ? Hemodialysis patient [facility])   ? Hyperlipidemia   ? Hypertension   ? Schizophrenia ([place])   ? Stroke [facility])   ? ? ?Past Surgical History:  ?Procedure Laterality Date  ? A/V FISTULAGRAM Left 02/24/2017  ? Procedure: A/V Fistulagram;  Surgeon: Katha Cabal, MD;  Location: [place] [facility];  Service: Cardiovascular;  Laterality: Left;  ? A/V FISTULAGRAM Left 09/03/2020  ? Procedure: A/V FISTULAGRAM;  Surgeon: Algernon Huxley, MD;  Location: [place] [facility];  Service: Cardiovascular;  Laterality: Left;  ? A/V FISTULAGRAM Left 06/27/2021  ? Procedure: A/V FISTULAGRAM;  Surgeon: Algernon Huxley, MD;  Location: [place] [facility];  Service: Cardiovascular;  Laterality: Left;  ? A/V FISTULAGRAM Left 07/24/2021  ? Procedure: A/V FISTULAGRAM;  Surgeon: Algernon Huxley, MD;  Location: [place] [facility];  Service: Cardiovascular;  Laterality: Left;  ? DIALYSIS/PERMA CATHETER INSERTION N/A 12/03/2021  ? Procedure: DIALYSIS/PERMA CATHETER INSERTION;  Surgeon: Algernon Huxley, MD;  Location: [place] [facility];  Service: Cardiovascular;  Laterality: N/A;  ? DIALYSIS/PERMA CATHETER REMOVAL N/A 03/13/2022  ? Procedure: DIALYSIS/PERMA CATHETER REMOVAL;  Surgeon: Algernon Huxley, MD;  Location: [place] [facility];  Service: Cardiovascular;  Laterality: N/A;  ? PERIPHERAL VASCULAR CATHETERIZATION N/A 04/17/2015  ? Procedure: Dialysis/Perma Catheter Insertion;  Surgeon: Katha Cabal, MD;  Location: [place] [facility];  Service: Cardiovascular;  Laterality: N/A;  ? PERIPHERAL VASCULAR CATHETERIZATION Left 07/30/2015  ? Procedure: A/V Shuntogram/Fistulagram;  Surgeon: Algernon Huxley, MD;  Location: [place] [facility];  Service: Cardiovascular;  Laterality: Left;  ? PERIPHERAL VASCULAR CATHETERIZATION Left 07/30/2015  ? Procedure: A/V Shunt Intervention;  Surgeon: Algernon Huxley, MD;  Location: [place] [facility];  Service: Cardiovascular;  Laterality: Left;  ? PERIPHERAL VASCULAR CATHETERIZATION N/A 08/20/2015  ? Procedure: DIALYSIS/PERMA CATHETER REMOVAL;  Surgeon: Algernon Huxley, MD;  Location: [place] [facility];  Service: Cardiovascular;  Laterality: N/A;  ? REVISON OF ARTERIOVENOUS FISTULA Left 12/04/2021  ? Procedure: REVISON OF ARTERIOVENOUS FISTULA;  Surgeon: Algernon Huxley, MD;  Location: [facility];  Service: Vascular;  Laterality: Left;  ? THROMBECTOMY W/ EMBOLECTOMY Left 12/04/2021  ? Procedure: THROMBECTOMY ARTERIOVENOUS FISTULA;  Surgeon: Algernon Huxley, MD;  Location: [facility];  Service: Vascular;  Laterality: Left;  ? VASCULAR SURGERY    ? Fistula placement  ? ? ?Social History ?Social History  ? ?Tobacco  Use  ? Smoking status: Never  ? Smokeless tobacco: Never  ?Substance Use Topics  ? Alcohol use: No  ? Drug use: No  ? ? ?Family History ?Family History  ?Problem Relation Age of Onset  ? Diabetes Mother   ? Kidney cancer Mother   ? Diabetes Sister   ? Stroke Brother   ? Prostate cancer Neg Hx   ? Bladder Cancer Neg Hx   ? ? ?Allergies  ?Allergen Reactions  ? Nsaids Other (See Comments)  ?  Cannot take due to renal disease  ? ? ? ?REVIEW  OF SYSTEMS (Negative unless checked) ? ?Constitutional: '[]'$ Weight loss  '[]'$ Fever  '[]'$ Chills ?Cardiac: '[]'$ Chest pain   '[]'$ Chest pressure   '[]'$ Palpitations   '[]'$ Shortness of breath when laying flat   '[]'$ Shortness of breath at rest   '[]'$ Shortness of breath with exertion. ?Vascular:  '[]'$ Pain in legs with walking   '[]'$ Pain in legs at rest   '[]'$ Pain in legs when laying flat   '[]'$ Claudication   '[]'$ Pain in feet when walking  '[]'$ Pain in feet at rest  '[]'$ Pain in feet when laying flat   '[]'$ History of DVT   '[]'$ Phlebitis   '[]'$ Swelling in legs   '[]'$ Varicose veins   '[]'$ Non-healing ulcers ?Pulmonary:   '[]'$ Uses home oxygen   '[]'$ Productive cough   '[]'$ Hemoptysis   '[]'$ Wheeze  '[]'$ COPD   '[]'$ Asthma ?Neurologic:  '[]'$ Dizziness  '[]'$ Blackouts   '[]'$ Seizures   '[]'$ History of stroke   '[]'$ History of TIA  '[]'$ Aphasia   '[]'$ Temporary blindness   '[]'$ Dysphagia   '[]'$ Weakness or numbness in arms   '[]'$ Weakness or numbness in legs ?Musculoskeletal:  '[]'$ Arthritis   '[]'$ Joint swelling   '[]'$ Joint pain   '[]'$ Low back pain ?Hematologic:  '[]'$ Easy bruising  '[]'$ Easy bleeding   '[]'$ Hypercoagulable state   '[]'$ Anemic  '[]'$ Hepatitis ?Gastrointestinal:  '[]'$ Blood in stool   '[]'$ Vomiting blood  '[]'$ Gastroesophageal reflux/heartburn   '[]'$ Difficulty swallowing. ?Genitourinary:  '[x]'$ Chronic kidney disease   '[]'$ Difficult urination  '[]'$ Frequent urination  '[]'$ Burning with urination   '[]'$ Blood in urine ?Skin:  '[]'$ Rashes   '[]'$ Ulcers   '[]'$ Wounds ?Psychological:  '[]'$ History of anxiety   '[]'$  History of major depression. ? ?Physical Examination ? ?Vitals:  ? 03/25/22 0240 03/25/22 0549 03/25/22 0630 03/25/22 0711   ?BP: 133/88 (!) 132/93 139/82 124/87  ?Pulse: 87 72 85 79  ?Resp: '20 11 20 '$ (!) 22  ?Temp: 98 ?F (36.7 ?C) 97.9 ?F (36.6 ?C) 98.4 ?F (36.9 ?C) 97.9 ?F (36.6 ?C)  ?TempSrc: Oral Oral  Oral  ?SpO2: 100% 100% 10

## 2022-03-25 NOTE — Discharge Summary (Signed)
?Physician Discharge Summary ?  ?Patient: Lawrence [name] MRN: [id number] DOB: 25-Jul-1956  ?Admit date:     03/24/2022  ?Discharge date: 03/25/22  ?Discharge Physician: Max Sane  ? ?PCP: Sharyne Peach, MD  ? ?Recommendations at discharge:  ? ?Follow-up with outpatient providers as requested ? ?Discharge Diagnoses: ?Principal Problem: ?  Dialysis AV fistula malfunction ([place]) ?Active Problems: ?  ESRD on dialysis [facility]) ?  Type 2 diabetes mellitus with hyperlipidemia ([place]) ?  ESRD on hemodialysis ([place]) ? ?Hospital Course: ? ?66 y.o. male with medical history significant of ESRD on hemodialysis Monday Wednesday Friday, hypertension, schizophrenia is being admitted for clotted left upper extremity fistula. ? ?Thrombosed left AV graft ?S/p declotting by vascular surgery on 4/18 and graft is working now ? ?ESRD ?Getting hemodialysis status post declotting of fistula ? ? ? ? ?  ? ? ?Consultants: Vascular surgery, nephrology ?Procedures performed: Declotting of AV fistula ?Disposition: Group home ?Diet recommendation:  ?Discharge Diet Orders (From admission, onward)  ? ?  Start     Ordered  ? 03/25/22 0000  Diet - low sodium heart healthy       ? 03/25/22 1337  ? ?  ?  ? ?  ? ?Renal diet ?DISCHARGE MEDICATION: ?Allergies as of 03/25/2022   ? ?   Reactions  ? Nsaids Other (See Comments)  ? Cannot take due to renal disease  ? ?  ? ?  ?Medication List  ?  ? ?STOP taking these medications   ? ?carvedilol 25 MG tablet ?Commonly known as: COREG ?  ? ?  ? ?TAKE these medications   ? ?acetaminophen 500 MG tablet ?Commonly known as: TYLENOL ?Take 500 mg by mouth every 4 (four) hours as needed for mild pain. ?  ?aspirin EC 81 MG tablet ?Take 81 mg by mouth daily. ?  ?benztropine 0.5 MG tablet ?Commonly known as: COGENTIN ?Take 0.5 mg by mouth 2 (two) times daily. ?  ?cetirizine 10 MG tablet ?Commonly known as: ZYRTEC ?Take 10 mg by mouth every other day. ?  ?Coenzyme Q10 100 MG capsule ?Take 100 mg by mouth at bedtime. ?   ?diphenhydrAMINE 25 mg capsule ?Commonly known as: BENADRYL ?Take 25 mg by mouth See admin instructions. Take 1 capsule ('25mg'$ ) by mouth nightly as needed for sleep - may take 1 capsule ('25mg'$ ) by mouth during the daytime as needed for itching ?  ?DIPHENHYDRAMINE-ZINC OXIDE EX ?Apply 1 application. topically See admin instructions. Apply small amount of affected area three times daily as needed ?  ?docusate sodium 100 MG capsule ?Commonly known as: COLACE ?Take 100 mg by mouth 2 (two) times daily. ?  ?docusate sodium 100 MG capsule ?Commonly known as: COLACE ?Take 200 mg by mouth daily as needed for mild constipation or moderate constipation. ?  ?feeding supplement Liqd ?Take 1 Container by mouth See admin instructions. Take 1 container by mouth three times a week or as needed of Nutritional Supplement ?  ?fluticasone 50 MCG/ACT nasal spray ?Commonly known as: FLONASE ?Place 2 sprays into both nostrils daily. ?  ?glipiZIDE 2.5 MG 24 hr tablet ?Commonly known as: GLUCOTROL XL ?Take 2.5 mg by mouth daily with breakfast. ?  ?haloperidol 10 MG tablet ?Commonly known as: HALDOL ?Take 5-10 mg by mouth See admin instructions. Take 5 mg in the morning and 10 mg at bedtime ?  ?lidocaine-prilocaine cream ?Commonly known as: EMLA ?Apply 1 application topically 3 (three) times a week. Apply over dialysis shunt 30 minutes prior to dialysis. ?  ?  midodrine 5 MG tablet ?Commonly known as: PROAMATINE ?Take 5 mg by mouth 3 (three) times a week. (Take 1 hour prior to dialysis) ?  ?mirtazapine 15 MG tablet ?Commonly known as: REMERON ?Take 15 mg by mouth at bedtime. ?  ?niacin 1000 MG CR tablet ?Commonly known as: NIASPAN ?Take 1,000 mg by mouth at bedtime. ?  ?polyethylene glycol 17 g packet ?Commonly known as: MIRALAX / GLYCOLAX ?Take 17 g by mouth daily as needed (Constipation). Mix 17 g (1 capful) in to 8 ounces of Juice/Water ?  ?pravastatin 40 MG tablet ?Commonly known as: PRAVACHOL ?Take 40 mg by mouth at bedtime. ?  ?sevelamer  carbonate 800 MG tablet ?Commonly known as: RENVELA ?Take 2 tablets (1,600 mg total) by mouth 3 (three) times daily with meals. ?  ?Vitamin D3 125 MCG (5000 UT) Caps ?Take 5,000 Units by mouth once a week. ?  ? ?  ? ? ?Discharge Exam: ?Filed Weights  ? 03/24/22 1501  ?Weight: 72.9 kg  ? ?66 year old male sitting in bed comfortably any acute distress ?Lungs clear to auscultation bilaterally ?Cardiovascular S1-S2 normal, no murmur rales ?Abdomen soft, benign ?Skin no rash or lesion ?Vascular access -left upper extremity fistula has no signs of infection.  Fistula is working ? ?Condition at discharge: good ? ?The results of significant diagnostics from this hospitalization (including imaging, microbiology, ancillary and laboratory) are listed below for reference.  ? ?Imaging Studies: ?PERIPHERAL VASCULAR CATHETERIZATION ? ?Result Date: 03/25/2022 ?See surgical note for result. ? ?PERIPHERAL VASCULAR CATHETERIZATION ? ?Result Date: 03/13/2022 ?See surgical note for result.  ? ?Microbiology: ?Results for orders placed or performed during the hospital encounter of 11/29/21  ?Resp Panel by RT-PCR (Flu A&B, Covid) Nasopharyngeal Swab     Status: None  ? Collection Time: 11/29/21  6:45 PM  ? Specimen: Nasopharyngeal Swab; Nasopharyngeal(NP) swabs in vial transport medium  ?Result Value Ref Range Status  ? SARS Coronavirus 2 by RT PCR NEGATIVE NEGATIVE Final  ?  Comment: (NOTE) ?SARS-CoV-2 target nucleic acids are NOT DETECTED. ? ?The SARS-CoV-2 RNA is generally detectable in upper respiratory ?specimens during the acute phase of infection. The lowest ?concentration of SARS-CoV-2 viral copies this assay can detect is ?138 copies/mL. A negative result does not preclude SARS-Cov-2 ?infection and should not be used as the sole basis for treatment or ?other patient management decisions. A negative result may occur with  ?improper specimen collection/handling, submission of specimen other ?than nasopharyngeal swab, presence of viral  mutation(s) within the ?areas targeted by this assay, and inadequate number of viral ?copies(<138 copies/mL). A negative result must be combined with ?clinical observations, patient history, and epidemiological ?information. The expected result is Negative. ? ?Fact Sheet for Patients:  ?EntrepreneurPulse.com.au ? ?Fact Sheet for Healthcare Providers:  ?IncredibleEmployment.be ? ?This test is no t yet approved or cleared by the [place] FDA and  ?has been authorized for detection and/or diagnosis of SARS-CoV-2 by ?FDA under an Emergency Use Authorization (EUA). This EUA will remain  ?in effect (meaning this test can be used) for the duration of the ?COVID-19 declaration under Section 564(b)(1) of the Act, 21 ?U.S.C.section 360bbb-3(b)(1), unless the authorization is terminated  ?or revoked sooner.  ? ? ?  ? Influenza A by PCR NEGATIVE NEGATIVE Final  ? Influenza B by PCR NEGATIVE NEGATIVE Final  ?  Comment: (NOTE) ?The Xpert Xpress SARS-CoV-2/FLU/RSV plus assay is intended as an aid ?in the diagnosis of influenza from Nasopharyngeal swab specimens and ?should not be used as a  sole basis for treatment. Nasal washings and ?aspirates are unacceptable for Xpert Xpress SARS-CoV-2/FLU/RSV ?testing. ? ?Fact Sheet for Patients: ?EntrepreneurPulse.com.au ? ?Fact Sheet for Healthcare Providers: ?IncredibleEmployment.be ? ?This test is not yet approved or cleared by the [place] FDA and ?has been authorized for detection and/or diagnosis of SARS-CoV-2 by ?FDA under an Emergency Use Authorization (EUA). This EUA will remain ?in effect (meaning this test can be used) for the duration of the ?COVID-19 declaration under Section 564(b)(1) of the Act, 21 U.S.C. ?section 360bbb-3(b)(1), unless the authorization is terminated or ?revoked. ? ?Performed at [facility], [place], ?[place] [address] ?  ? ? ?Labs: ?CBC: ?Recent Labs   ?Lab 03/24/22 ?1454  ?WBC 6.3  ?HGB 12.1*  ?HCT 38.9*  ?MCV 102.4*  ?PLT 129*  ? ?Basic Metabolic Panel: ?Recent Labs  ?Lab 03/24/22 ?1454  ?NA 135  ?K 4.9  ?CL 90*  ?CO2 31  ?GLUCOSE 88  ?BUN 63*  ?CREATININE 12.

## 2022-03-25 NOTE — TOC Initial Note (Signed)
Transition of Care (TOC) - Initial/Assessment Note  ? ? ?Patient Details  ?Name: Lawrence [name] ?MRN: [id number] ?Date of Birth: 07/18/1956 ? ?Transition of Care (TOC) CM/SW Contact:    ?Candie Chroman, LCSW ?Phone Number: ?03/25/2022, 1:33 PM ? ?Clinical Narrative:   CSW called Alfreda Graves. She confirmed patient is one of her residents at [facility]. Obtained fax number to send discharge paperwork once released. She confirmed patient is his own guardian.              ? ?Expected Discharge Plan: Group Home ?Barriers to Discharge: Continued Medical Work up ? ? ?Patient Goals and CMS Choice ?  ?  ?  ? ?Expected Discharge Plan and Services ?Expected Discharge Plan: Group Home ?  ?  ?Post Acute Care Choice: NA ?Living arrangements for the past 2 months: Group Home ?                ?  ?  ?  ?  ?  ?  ?  ?  ?  ?  ? ?Prior Living Arrangements/Services ?Living arrangements for the past 2 months: Group Home ?Lives with:: Facility Resident ?Patient language and need for interpreter reviewed:: Yes ?Do you feel safe going back to the place where you live?: Yes      ?Need for Family Participation in Patient Care: Yes (Comment) ?Care giver support system in place?: Yes (comment) ?  ?Criminal Activity/Legal Involvement Pertinent to Current Situation/Hospitalization: No - Comment as needed ? ?Activities of Daily Living ?Home Assistive Devices/Equipment: Eyeglasses ?ADL Screening (condition at time of admission) ?Patient's cognitive ability adequate to safely complete daily activities?: Yes ?Is the patient deaf or have difficulty hearing?: No ?Does the patient have difficulty seeing, even when wearing glasses/contacts?: No ?Does the patient have difficulty concentrating, remembering, or making decisions?: No ?Patient able to express need for assistance with ADLs?: Yes ?Does the patient have difficulty dressing or bathing?: No ?Independently performs ADLs?: Yes (appropriate for developmental age) ?Does the patient  have difficulty walking or climbing stairs?: No ?Weakness of Legs: None ?Weakness of Arms/Hands: None ? ?Permission Sought/Granted ?Permission sought to share information with : Customer service manager ?  ? Share Information with NAME: Alfreda Graves ? Permission granted to share info w AGENCY: [place] ? Permission granted to share info w Relationship: Group home caregiver ? Permission granted to share info w Contact Information: [phone] ? ?Emotional Assessment ?  ?  ?  ?Orientation: : Oriented to Self, Oriented to Place, Oriented to  Time, Oriented to Situation ?Alcohol / Substance Use: Not Applicable ?Psych Involvement: No (comment) ? ?Admission diagnosis:  ESRD on dialysis ([place]) [N18.6, Z99.2] ?Dialysis AV fistula malfunction ([place]) [I94.854O] ?Arteriovenous fistula occlusion, initial encounter ([place]) [T82.898A] ?Patient Active Problem List  ? Diagnosis Date Noted  ? Arteriovenous fistula occlusion (HCC)   ? Dialysis AV fistula malfunction ([place]) 11/29/2021  ? Fluid overload, unspecified 05/30/2020  ? Schizophrenia ([place]) 03/09/2020  ? Anuria 10/02/2019  ? Xerosis cutis 10/02/2019  ? Secondary hyperparathyroidism of renal origin ([place]) 06/28/2019  ? Leukopenia 04/17/2019  ? 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  ? Elevated ferritin 04/17/2019  ? Hypokalemia 10/14/2017  ? ESRD on hemodialysis ([place]) 10/14/2017  ? Thrombocytopenia ([place]) 10/14/2017  ? Acute urinary tract infection 10/13/2017  ? History of CVA (cerebrovascular accident) 10/13/2017  ? Chronic schizoaffective disorder ([place]) 04/09/2016  ? Other schizoaffective disorders ([place]) 04/09/2016  ? Complication of diabetes mellitus ([place]) 03/09/2016  ? Stage 5 chronic kidney disease (  [place]) 03/09/2016  ? Thyroid nodule 03/09/2016  ? Aftercare including intermittent dialysis ([place]) 09/25/2015  ? Encounter for immunization 07/03/2015  ? Moderate protein-calorie malnutrition ([place]) 05/17/2015  ? Complication of vascular dialysis catheter 04/30/2015  ? Chest  pain, unspecified 04/26/2015  ? Coagulation defect, unspecified ([place]) 04/26/2015  ? Disorder of phosphorus metabolism, unspecified 04/26/2015  ? Hypoglycemia, unspecified 04/26/2015  ? Iron deficiency anemia, unspecified 04/26/2015  ? 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 Pain, unspecified 04/26/2015  ? Pruritus, unspecified 04/26/2015  ? Shortness of breath 04/26/2015  ? Type 2 diabetes mellitus without complications ([place]) 02/26/2247  ? Essential (primary) hypertension 04/26/2015  ? Schizophrenia, unspecified ([place]) 04/26/2015  ? Thrombocytopenia, unspecified ([place]) 04/26/2015  ? ESRD on dialysis ([place]) 04/16/2015  ? Goiter 02/27/2015  ? Dry mouth 02/27/2015  ? 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 Controlled type 2 diabetes mellitus without complication, without long-term current use of insulin ([place]) 01/21/2015  ? Benign hypertension with CKD (chronic kidney disease) stage IV ([place]) 01/21/2015  ? Encounter for screening for malignant neoplasm of colon 08/02/2014  ? Vitamin D deficiency 05/12/2013  ? Cerebrovascular accident (CVA) ([place]) 04/19/2013  ? Type 2 diabetes mellitus with hyperlipidemia ([place]) 04/19/2013  ? ?PCP:  Sharyne Peach, MD ?Pharmacy:   ?[facility] ?[place] [address] ?Phone: [phone] Fax: [phone] ? ? ? ? ?Social Determinants of Health (SDOH) Interventions ?  ? ?Readmission Risk Interventions ?   ? View : No data to display.  ?  ?  ?  ? ? ? ?

## 2022-03-25 NOTE — Progress Notes (Signed)
Hemodialysis Post Treatment Note: ? ?Tx date:03/25/2022 ?Tx time: 3 hours ?Access:Left AVF ?UF Removed:1025m ? ?Note: ?HD completed, tolerated well. No complaints. Patient asymptomatic. ? ? ? ? ? ? ? ? ?  ?

## 2022-03-25 NOTE — Progress Notes (Signed)
Patient alert and oriented x4, no distress noted. Vital signs stable. Patient IV removed, clothing put on. Lawrence [name] notified that patient was ready for discharge; she sent transportation. Patient taken to lobby by staff in wheelchair; released to the care of his previous group home staff. ?

## 2022-03-26 ENCOUNTER — Telehealth (INDEPENDENT_AMBULATORY_CARE_PROVIDER_SITE_OTHER): Payer: Self-pay

## 2022-03-26 ENCOUNTER — Encounter: Payer: Self-pay | Admitting: Vascular Surgery

## 2022-03-26 NOTE — Telephone Encounter (Signed)
Spoke with Sharyn Lull at [facility] and the patient's left arm access is clotted. Spoke with Eulogio Ditch NP and per her the patient is scheduled with Dr. Lucky Cowboy on 03/27/22 for a left arm declot and permcath insertion. Arrival time to the MM is at 9:00 am and Sharyn Lull was given pre-procedure instructions and will relay the information to the patient's caregiver. ?

## 2022-03-27 ENCOUNTER — Encounter
Admission: RE | Disposition: A | Discharge status: Home / Self Care | Payer: Self-pay | Source: Ambulatory Visit | Attending: Vascular Surgery

## 2022-03-27 ENCOUNTER — Ambulatory Visit
Admission: RE | Admit: 2022-03-27 | Discharge: 2022-03-27 | Disposition: A | Discharge status: Home / Self Care | Payer: Medicare Other | Source: Ambulatory Visit | Attending: Vascular Surgery | Admitting: Vascular Surgery

## 2022-03-27 ENCOUNTER — Encounter: Payer: Self-pay | Admitting: Vascular Surgery

## 2022-03-27 ENCOUNTER — Other Ambulatory Visit: Payer: Self-pay

## 2022-03-27 DIAGNOSIS — Y841 Kidney dialysis as the cause of abnormal reaction of the patient, or of later complication, without mention of misadventure at the time of the procedure: Secondary | ICD-10-CM | POA: Diagnosis not present

## 2022-03-27 DIAGNOSIS — I120 Hypertensive chronic kidney disease with stage 5 chronic kidney disease or end stage renal disease: Secondary | ICD-10-CM | POA: Insufficient documentation

## 2022-03-27 DIAGNOSIS — E1122 Type 2 diabetes mellitus with diabetic chronic kidney disease: Secondary | ICD-10-CM | POA: Diagnosis not present

## 2022-03-27 DIAGNOSIS — T82898A Other specified complication of vascular prosthetic devices, implants and grafts, initial encounter: Secondary | ICD-10-CM | POA: Insufficient documentation

## 2022-03-27 DIAGNOSIS — Z992 Dependence on renal dialysis: Secondary | ICD-10-CM | POA: Diagnosis not present

## 2022-03-27 DIAGNOSIS — T82858A Stenosis of vascular prosthetic devices, implants and grafts, initial encounter: Secondary | ICD-10-CM

## 2022-03-27 DIAGNOSIS — N186 End stage renal disease: Secondary | ICD-10-CM | POA: Diagnosis not present

## 2022-03-27 HISTORY — PX: DIALYSIS/PERMA CATHETER INSERTION: CATH118288

## 2022-03-27 HISTORY — PX: PERIPHERAL VASCULAR THROMBECTOMY: CATH118306

## 2022-03-27 LAB — GLUCOSE, CAPILLARY: Glucose-Capillary: 82 mg/dL (ref 70–99)

## 2022-03-27 LAB — HEPATITIS B SURFACE ANTIBODY, QUANTITATIVE: Hep B S AB Quant (Post): 74.2 m[IU]/mL

## 2022-03-27 LAB — POTASSIUM (ARMC VASCULAR LAB ONLY): Potassium (ARMC vascular lab): 5.9 mmol/L — ABNORMAL HIGH (ref 3.5–5.1)

## 2022-03-27 SURGERY — PERIPHERAL VASCULAR THROMBECTOMY
Anesthesia: Moderate Sedation

## 2022-03-27 MED ORDER — SODIUM CHLORIDE 0.9 % IV SOLN: INTRAVENOUS | Status: DC

## 2022-03-27 MED ORDER — HYDROMORPHONE HCL 1 MG/ML IJ SOLN: 1.0000 mg | Freq: Once | INTRAMUSCULAR | Status: DC | PRN

## 2022-03-27 MED ORDER — MIDAZOLAM HCL 2 MG/2ML IJ SOLN
INTRAMUSCULAR | Status: AC
  Filled 2022-03-27: qty 4

## 2022-03-27 MED ORDER — MIDAZOLAM HCL 2 MG/ML PO SYRP
8.0000 mg | ORAL_SOLUTION | Freq: Once | ORAL | Status: DC | PRN
Start: 2022-03-27 — End: 2022-03-29

## 2022-03-27 MED ORDER — ONDANSETRON HCL 4 MG/2ML IJ SOLN: 4.0000 mg | Freq: Four times a day (QID) | INTRAMUSCULAR | Status: DC | PRN

## 2022-03-27 MED ORDER — IODIXANOL 320 MG/ML IV SOLN
INTRAVENOUS | Status: DC | PRN
  Administered 2022-03-27: 15 mL via INTRAVENOUS

## 2022-03-27 MED ORDER — CEFAZOLIN SODIUM-DEXTROSE 1-4 GM/50ML-% IV SOLN
INTRAVENOUS | Status: AC
  Administered 2022-03-27: 1 g via INTRAVENOUS
  Filled 2022-03-27: qty 50

## 2022-03-27 MED ORDER — METHYLPREDNISOLONE SODIUM SUCC 125 MG IJ SOLR: 125.0000 mg | Freq: Once | INTRAMUSCULAR | Status: DC | PRN

## 2022-03-27 MED ORDER — FAMOTIDINE 20 MG PO TABS
40.0000 mg | ORAL_TABLET | Freq: Once | ORAL | Status: DC | PRN
Start: 2022-03-27 — End: 2022-03-29

## 2022-03-27 MED ORDER — CEFAZOLIN SODIUM-DEXTROSE 1-4 GM/50ML-% IV SOLN: 1.0000 g | INTRAVENOUS | Status: AC

## 2022-03-27 MED ORDER — FENTANYL CITRATE PF 50 MCG/ML IJ SOSY
PREFILLED_SYRINGE | INTRAMUSCULAR | Status: AC
  Filled 2022-03-27: qty 2

## 2022-03-27 MED ORDER — HEPARIN SODIUM (PORCINE) 1000 UNIT/ML IJ SOLN
INTRAMUSCULAR | Status: AC
  Filled 2022-03-27: qty 10

## 2022-03-27 MED ORDER — DIPHENHYDRAMINE HCL 50 MG/ML IJ SOLN: 50.0000 mg | Freq: Once | INTRAMUSCULAR | Status: DC | PRN

## 2022-03-27 MED ORDER — FENTANYL CITRATE (PF) 100 MCG/2ML IJ SOLN
INTRAMUSCULAR | Status: DC | PRN
  Administered 2022-03-27: 50 ug via INTRAVENOUS

## 2022-03-27 MED ORDER — MIDAZOLAM HCL 2 MG/2ML IJ SOLN
INTRAMUSCULAR | Status: DC | PRN
  Administered 2022-03-27: 1 mg via INTRAVENOUS

## 2022-03-27 SURGICAL SUPPLY — 7 items
CANNULA 5F STIFF (CANNULA) ×1 IMPLANT
COVER PROBE U/S 5X48 (MISCELLANEOUS) ×1 IMPLANT
DRAPE BRACHIAL (DRAPES) ×1 IMPLANT
GAUZE SPONGE 4X4 12PLY STRL (GAUZE/BANDAGES/DRESSINGS) ×2 IMPLANT
PACK ANGIOGRAPHY (CUSTOM PROCEDURE TRAY) ×3 IMPLANT
SUT MNCRL AB 4-0 PS2 18 (SUTURE) ×1 IMPLANT
TOWEL OR 17X26 4PK STRL BLUE (TOWEL DISPOSABLE) ×1 IMPLANT

## 2022-03-27 NOTE — Op Note (Signed)
[facility] ? ? ? ?OPERATIVE NOTE ? ? ?PROCEDURE: ?1.   Left arm arteriovenous fistula cannulation under ultrasound guidance ?2.   Left arm fistulagram including central venogram ? ? ?PRE-OPERATIVE DIAGNOSIS: 1. ESRD ?2. Poorly functional left arm AVF with previous jump graft revision ? ?POST-OPERATIVE DIAGNOSIS: same as above  ? ?SURGEON: [name], MD ? ?ANESTHESIA: local with MCS ? ?ESTIMATED BLOOD LOSS: Minimal ? ?FINDING(S): ?This demonstrated that the AV fistula and jump graft were widely patent without significant stenosis.  There was mild stenosis at the cephalic vein subclavian vein confluence of less than 40%.  The central venous circulation was widely patent. ? ?SPECIMEN(S):  None ? ?CONTRAST: 15 cc ? ?FLUORO TIME: 0.2 minutes ? ?MODERATE CONSCIOUS SEDATION TIME: Approximately 18 minutes with 1 mg of Versed and 50 mcg of Fentanyl  ? ?INDICATIONS: ?Lawrence [name] is a 66 y.o. male who presents with malfunctioning left arm arteriovenous fistula with a previous jump graft revision.  The patient is scheduled for left fistulagram.  The patient is aware the risks include but are not limited to: bleeding, infection, thrombosis of the cannulated access, and possible anaphylactic reaction to the contrast.  The patient is aware of the risks of the procedure and elects to proceed forward. ? ?DESCRIPTION: ?After full informed written consent was obtained, the patient was brought back to the angiography suite and placed supine upon the angiography table.  The patient was connected to monitoring equipment. Moderate conscious sedation was administered with a face to face encounter with the patient throughout the procedure with my supervision of the RN administering medicines and monitoring the patient's vital signs and mental status throughout from the start of the procedure until the patient was taken to the recovery room. The left arm was prepped and draped in the standard fashion for a  percutaneous access intervention.  Under ultrasound guidance, the left arm arteriovenous fistula was cannulated with a micropuncture needle under direct ultrasound guidance where it was patent and a permanent image was performed.  The microwire was advanced into the fistula and the needle was exchanged for the a microsheath.  Hand injections were completed to image the access including the central venous system. This demonstrated that the AV fistula and jump graft were widely patent without significant stenosis.  There was mild stenosis at the cephalic vein subclavian vein confluence of less than 40%.  The central venous circulation was widely patent.  Based on the images, this patient will need no intervention.  A 4-0 Monocryl purse-string suture was sewn around the sheath.  The sheath was removed while tying down the suture.  A sterile bandage was applied to the puncture site. ? ?COMPLICATIONS: None ? ?CONDITION: Stable ? ? ?[name] ? ?03/27/2022 10:39 AM ? ? ?This note was created with Dragon Medical transcription system. Any errors in dictation are purely unintentional.  ?

## 2022-03-27 NOTE — Discharge Instructions (Signed)
Shuntogram, Care After °Refer to this sheet in the next few weeks. These instructions provide you with information on caring for yourself after your procedure. Your health care provider may also give you more specific instructions. Your treatment has been planned according to current medical practices, but problems sometimes occur. Call your health care provider if you have any problems or questions after your procedure. °What can I expect after the procedure? °After your procedure, it is typical to have the following: °· A small amount of discomfort in the area where the catheters were placed. °· A small amount of bruising around the fistula. °· Sleepiness and fatigue. ° °Follow these instructions at home: °· Rest at home for the day following your procedure. °· Do not drive or operate heavy machinery while taking pain medicine. °· Take medicines only as directed by your health care provider. °· Do not take baths, swim, or use a hot tub until your health care provider approves. You may shower 24 hours after the procedure or as directed by your health care provider. °· There are many different ways to close and cover an incision, including stitches, skin glue, and adhesive strips. Follow your health care provider's instructions on: °? Incision care. °? Bandage (dressing) changes and removal. °? Incision closure removal. °· Monitor your dialysis fistula carefully. °Contact a health care provider if: °· You have drainage, redness, swelling, or pain at your catheter site. °· You have a fever. °· You have chills. °Get help right away if: °· You feel weak. °· You have trouble balancing. °· You have trouble moving your arms or legs. °· You have problems with your speech or vision. °· You can no longer feel a vibration or buzz when you put your fingers over your dialysis fistula. °· The limb that was used for the procedure: °? Swells. °? Is painful. °? Is cold. °? Is discolored, such as blue or pale white. °This  information is not intended to replace advice given to you by your health care provider. Make sure you discuss any questions you have with your health care provider. °Document Released: 04/10/2014 Document Revised: 05/01/2016 Document Reviewed: 01/13/2014 °Elsevier Interactive Patient Education © 2018 [employer] Inc[employer] ° °

## 2022-03-27 NOTE — Interval H&P Note (Signed)
History and Physical Interval Note: ? ?03/27/2022 ?9:02 AM ? ?Lawrence [name]  has presented today for surgery, with the diagnosis of Left arm declot and Perma Cath Insert      End Stage Renal.  The various methods of treatment have been discussed with the patient and family. After consideration of risks, benefits and other options for treatment, the patient has consented to  Procedure(s): ?PERIPHERAL VASCULAR THROMBECTOMY (Left) ?DIALYSIS/PERMA CATHETER INSERTION (N/A) as a surgical intervention.  The patient's history has been reviewed, patient examined, no change in status, stable for surgery.  I have reviewed the patient's chart and labs.  Questions were answered to the patient's satisfaction.   ? ? ?[name] ? ? ?

## 2022-04-01 ENCOUNTER — Encounter (INDEPENDENT_AMBULATORY_CARE_PROVIDER_SITE_OTHER): Payer: Medicare Other

## 2022-04-01 ENCOUNTER — Ambulatory Visit (INDEPENDENT_AMBULATORY_CARE_PROVIDER_SITE_OTHER): Payer: Medicare Other | Admitting: Vascular Surgery

## 2022-04-23 ENCOUNTER — Other Ambulatory Visit: Payer: Self-pay

## 2022-04-23 DIAGNOSIS — R972 Elevated prostate specific antigen [PSA]: Secondary | ICD-10-CM

## 2022-05-12 ENCOUNTER — Other Ambulatory Visit: Payer: Medicare Other

## 2022-05-12 ENCOUNTER — Encounter: Payer: Self-pay | Admitting: Urology

## 2022-05-15 ENCOUNTER — Ambulatory Visit (INDEPENDENT_AMBULATORY_CARE_PROVIDER_SITE_OTHER): Payer: Medicare Other

## 2022-05-15 ENCOUNTER — Encounter (INDEPENDENT_AMBULATORY_CARE_PROVIDER_SITE_OTHER): Payer: Self-pay | Admitting: Nurse Practitioner

## 2022-05-15 ENCOUNTER — Ambulatory Visit: Payer: Medicare Other | Admitting: Urology

## 2022-05-15 ENCOUNTER — Ambulatory Visit (INDEPENDENT_AMBULATORY_CARE_PROVIDER_SITE_OTHER): Payer: Medicare Other | Admitting: Nurse Practitioner

## 2022-05-15 VITALS — BP 108/70 | HR 84 | Resp 17 | Ht 67.0 in | Wt 160.0 lb

## 2022-05-15 DIAGNOSIS — I10 Essential (primary) hypertension: Secondary | ICD-10-CM

## 2022-05-15 DIAGNOSIS — Z992 Dependence on renal dialysis: Secondary | ICD-10-CM

## 2022-05-15 DIAGNOSIS — N186 End stage renal disease: Secondary | ICD-10-CM | POA: Diagnosis not present

## 2022-05-15 DIAGNOSIS — E1122 Type 2 diabetes mellitus with diabetic chronic kidney disease: Secondary | ICD-10-CM | POA: Diagnosis not present

## 2022-05-20 ENCOUNTER — Ambulatory Visit (INDEPENDENT_AMBULATORY_CARE_PROVIDER_SITE_OTHER): Payer: Medicare Other | Admitting: Vascular Surgery

## 2022-05-20 ENCOUNTER — Encounter (INDEPENDENT_AMBULATORY_CARE_PROVIDER_SITE_OTHER): Payer: Medicare Other

## 2022-05-28 ENCOUNTER — Encounter (INDEPENDENT_AMBULATORY_CARE_PROVIDER_SITE_OTHER): Payer: Self-pay | Admitting: Nurse Practitioner

## 2022-05-28 NOTE — Progress Notes (Signed)
Subjective:    Patient ID: Lawrence [name], male    DOB: February 24, 1956, 66 y.o.   MRN: [id number] No chief complaint on file.   The patient returns to the office for followup of their dialysis access.   The patient reports the function of the access has been stable. Patient denies difficulty with cannulation. The patient denies increased bleeding time after removing the needles. The patient denies hand pain or other symptoms consistent with steal phenomena.  No significant arm swelling.  The patient denies any complaints from the dialysis center or their nephrologist.  The patient denies redness or swelling at the access site. The patient denies fever or chills at home or while on dialysis.  No recent shortening of the patient's walking distance or new symptoms consistent with claudication.  No history of rest pain symptoms. No new ulcers or wounds of the lower extremities have occurred.  The patient denies amaurosis fugax or recent TIA symptoms. There are no recent neurological changes noted. There is no history of DVT, PE or superficial thrombophlebitis. No recent episodes of angina or shortness of breath documented.   Duplex ultrasound of the AV access shows a patent access.  The previously noted stenosis is not significantly changed compared to last study.  Flow volume today is 2663 cc/min (previous flow volume was 1761 cc/min)       Review of Systems  Skin:  Negative for wound.  Hematological:  Does not bruise/bleed easily.  All other systems reviewed and are negative.      Objective:   Physical Exam Vitals reviewed.  HENT:     Head: Normocephalic.  Cardiovascular:     Rate and Rhythm: Normal rate.     Pulses: Normal pulses.  Pulmonary:     Effort: Pulmonary effort is normal.  Skin:    General: Skin is warm and dry.  Neurological:     Mental Status: He is alert and oriented to person, place, and time.  Psychiatric:        Mood and Affect: Mood normal.         Behavior: Behavior normal.        Thought Content: Thought content normal.        Judgment: Judgment normal.     BP 108/70 (BP Location: Right Arm)   Pulse 84   Resp 17   Ht '5\' 7"'$  (1.702 m)   Wt 160 lb (72.6 kg)   BMI 25.06 kg/m   Past Medical History:  Diagnosis Date   Anemia    Cerebral hemorrhage ([place]) 2012   Chronic constipation    Chronic kidney disease    Diabetes mellitus without complication (HCC)    Hemodialysis patient ([place])    Hyperlipidemia    Hypertension    Schizophrenia ([place])    Stroke [facility])     Social History   Socioeconomic History   Marital status: Single    Spouse name: Not on file   Number of children: Not on file   Years of education: Not on file   Highest education level: Not on file  Occupational History   Not on file  Tobacco Use   Smoking status: Never   Smokeless tobacco: Never  Substance and Sexual Activity   Alcohol use: No   Drug use: No   Sexual activity: Not Currently    Birth control/protection: None  Other Topics Concern   Not on file  Social History Narrative    Lives at group home: [facility]  home with 6 other people.   Social Determinants of Health   Financial Resource Strain: Not on file  Food Insecurity: Not on file  Transportation Needs: Not on file  Physical Activity: Not on file  Stress: Not on file  Social Connections: Not on file  Intimate Partner Violence: Not on file    Past Surgical History:  Procedure Laterality Date   A/V FISTULAGRAM Left 02/24/2017   Procedure: A/V Fistulagram;  Surgeon: Katha Cabal, MD;  Location: [place] [facility];  Service: Cardiovascular;  Laterality: Left;   A/V FISTULAGRAM Left 09/03/2020   Procedure: A/V FISTULAGRAM;  Surgeon: Algernon Huxley, MD;  Location: [place] [facility];  Service: Cardiovascular;  Laterality: Left;   A/V FISTULAGRAM Left 06/27/2021   Procedure: A/V FISTULAGRAM;  Surgeon: Algernon Huxley, MD;  Location: [place] [facility];  Service:  Cardiovascular;  Laterality: Left;   A/V FISTULAGRAM Left 07/24/2021   Procedure: A/V FISTULAGRAM;  Surgeon: Algernon Huxley, MD;  Location: [place] [facility];  Service: Cardiovascular;  Laterality: Left;   A/V FISTULAGRAM Left 03/25/2022   Procedure: A/V Fistulagram;  Surgeon: Evaristo Bury, MD;  Location: [place] [facility];  Service: Cardiovascular;  Laterality: Left;   DIALYSIS/PERMA CATHETER INSERTION N/A 12/03/2021   Procedure: DIALYSIS/PERMA CATHETER INSERTION;  Surgeon: Algernon Huxley, MD;  Location: [place] [facility];  Service: Cardiovascular;  Laterality: N/A;   DIALYSIS/PERMA CATHETER INSERTION N/A 03/27/2022   Procedure: DIALYSIS/PERMA CATHETER INSERTION;  Surgeon: Algernon Huxley, MD;  Location: [place] [facility];  Service: Cardiovascular;  Laterality: N/A;   DIALYSIS/PERMA CATHETER REMOVAL N/A 03/13/2022   Procedure: DIALYSIS/PERMA CATHETER REMOVAL;  Surgeon: Algernon Huxley, MD;  Location: [place] [facility];  Service: Cardiovascular;  Laterality: N/A;   PERIPHERAL VASCULAR CATHETERIZATION N/A 04/17/2015   Procedure: Dialysis/Perma Catheter Insertion;  Surgeon: Katha Cabal, MD;  Location: [place] [facility];  Service: Cardiovascular;  Laterality: N/A;   PERIPHERAL VASCULAR CATHETERIZATION Left 07/30/2015   Procedure: A/V Shuntogram/Fistulagram;  Surgeon: Algernon Huxley, MD;  Location: [place] [facility];  Service: Cardiovascular;  Laterality: Left;   PERIPHERAL VASCULAR CATHETERIZATION Left 07/30/2015   Procedure: A/V Shunt Intervention;  Surgeon: Algernon Huxley, MD;  Location: [place] [facility];  Service: Cardiovascular;  Laterality: Left;   PERIPHERAL VASCULAR CATHETERIZATION N/A 08/20/2015   Procedure: DIALYSIS/PERMA CATHETER REMOVAL;  Surgeon: Algernon Huxley, MD;  Location: [place] [facility];  Service: Cardiovascular;  Laterality: N/A;   PERIPHERAL VASCULAR THROMBECTOMY Left 03/27/2022   Procedure: PERIPHERAL VASCULAR THROMBECTOMY;  Surgeon: Algernon Huxley, MD;  Location: [place] [facility];  Service: Cardiovascular;  Laterality: Left;   REVISON OF ARTERIOVENOUS FISTULA Left 12/04/2021   Procedure: REVISON OF ARTERIOVENOUS FISTULA;  Surgeon: Algernon Huxley, MD;  Location: [facility];  Service: Vascular;  Laterality: Left;   THROMBECTOMY W/ EMBOLECTOMY Left 12/04/2021   Procedure: THROMBECTOMY ARTERIOVENOUS FISTULA;  Surgeon: Algernon Huxley, MD;  Location: [facility];  Service: Vascular;  Laterality: Left;   VASCULAR SURGERY     Fistula placement    Family History  Problem Relation Age of Onset   Diabetes Mother    Kidney cancer Mother    Diabetes Sister    Stroke Brother    Prostate cancer Neg Hx    Bladder Cancer Neg Hx     Allergies  Allergen Reactions   Nsaids Other (See Comments)    Cannot take due to renal disease  Latest Ref Rng & Units 03/24/2022    2:54 PM 12/05/2021    4:32 AM 12/02/2021   10:36 AM  CBC  WBC 4.0 - 10.5 K/uL 6.3  8.8  6.1   Hemoglobin 13.0 - 17.0 g/dL 12.1  10.1  10.4   Hematocrit 39.0 - 52.0 % 38.9  31.5  32.3   Platelets 150 - 400 K/uL 129  99  79       CMP     Component Value Date/Time   NA 135 03/24/2022 1454   NA 140 03/21/2015 1512   K 4.9 03/24/2022 1454   K 4.0 03/21/2015 1512   CL 90 (L) 03/24/2022 1454   CL 95 (L) 03/21/2015 1512   CO2 31 03/24/2022 1454   CO2 36 (H) 03/21/2015 1512   GLUCOSE 88 03/24/2022 1454   GLUCOSE 117 (H) 03/21/2015 1512   BUN 63 (H) 03/24/2022 1454   BUN 64 (H) 03/21/2015 1512   CREATININE 12.79 (H) 03/24/2022 1454   CREATININE 7.71 (H) 03/21/2015 1512   CALCIUM 10.4 (H) 03/24/2022 1454   CALCIUM 8.5 (L) 03/21/2015 1512   PROT 6.6 11/30/2021 1147   PROT 5.8 (L) 12/13/2014 1354   ALBUMIN 3.3 (L) 11/30/2021 1147   ALBUMIN 2.5 (L) 12/13/2014 1354   AST 9 (L) 11/30/2021 1147   AST 17 12/13/2014 1354   ALT 12 11/30/2021 1147   ALT 21 12/13/2014 1354   ALKPHOS 85 11/30/2021 1147   ALKPHOS 50 12/13/2014 1354   BILITOT 0.9 11/30/2021 1147   BILITOT 0.2 12/13/2014 1354    GFRNONAA 4 (L) 03/24/2022 1454   GFRNONAA 7 (L) 03/21/2015 1512   GFRAA 21 (L) 03/08/2020 1756   GFRAA 8 (L) 03/21/2015 1512     No results found.     Assessment & Plan:   1. ESRD on dialysis [facility]) Recommend:  The patient is doing well and currently has adequate dialysis access. The patient's dialysis center is not reporting any access issues. Flow pattern is stable when compared to the prior ultrasound.  The patient should have a duplex ultrasound of the dialysis access in 6 months. The patient will follow-up with me in the office after each ultrasound     2. Essential (primary) hypertension Continue antihypertensive medications as already ordered, these medications have been reviewed and there are no changes at this time.   3. Type 2 diabetes mellitus with chronic kidney disease on chronic dialysis, unspecified whether long term insulin use ([place]) Continue hypoglycemic medications as already ordered, these medications have been reviewed and there are no changes at this time.  Hgb A1C to be monitored as already arranged by primary service    Current Outpatient Medications on File Prior to Visit  Medication Sig Dispense Refill   acetaminophen (TYLENOL) 500 MG tablet Take 500 mg by mouth every 4 (four) hours as needed for mild pain.     aspirin EC 81 MG tablet Take 81 mg by mouth daily.      benztropine (COGENTIN) 0.5 MG tablet Take 0.5 mg by mouth 2 (two) times daily.     carvedilol (COREG) 25 MG tablet Take by mouth.     cetirizine (ZYRTEC) 10 MG tablet Take 10 mg by mouth every other day.     Cholecalciferol (VITAMIN D3) 125 MCG (5000 UT) CAPS Take 5,000 Units by mouth once a week.     Coenzyme Q10 100 MG capsule Take 100 mg by mouth at bedtime.     diphenhydrAMINE (BENADRYL) 25 mg  capsule Take 25 mg by mouth See admin instructions. Take 1 capsule ('25mg'$ ) by mouth nightly as needed for sleep - may take 1 capsule ('25mg'$ ) by mouth during the daytime as needed for itching      DIPHENHYDRAMINE-ZINC OXIDE EX Apply 1 application. topically See admin instructions. Apply small amount of affected area three times daily as needed     docusate sodium (COLACE) 100 MG capsule Take 100 mg by mouth 2 (two) times daily.     docusate sodium (COLACE) 100 MG capsule Take 200 mg by mouth daily as needed for mild constipation or moderate constipation.     feeding supplement (BOOST HIGH PROTEIN) LIQD Take 1 Container by mouth See admin instructions. Take 1 container by mouth three times a week or as needed of Nutritional Supplement     fluticasone (FLONASE) 50 MCG/ACT nasal spray Place 2 sprays into both nostrils daily.     glipiZIDE (GLUCOTROL XL) 2.5 MG 24 hr tablet Take 2.5 mg by mouth daily with breakfast.      haloperidol (HALDOL) 10 MG tablet Take 5-10 mg by mouth See admin instructions. Take 5 mg in the morning and 10 mg at bedtime     lidocaine-prilocaine (EMLA) cream Apply 1 application topically 3 (three) times a week. Apply over dialysis shunt 30 minutes prior to dialysis.     midodrine (PROAMATINE) 5 MG tablet Take 5 mg by mouth 3 (three) times a week. (Take 1 hour prior to dialysis)     mirtazapine (REMERON) 15 MG tablet Take 15 mg by mouth at bedtime.     niacin (NIASPAN) 1000 MG CR tablet Take 1,000 mg by mouth at bedtime.      polyethylene glycol (MIRALAX / GLYCOLAX) 17 g packet Take 17 g by mouth daily as needed (Constipation). Mix 17 g (1 capful) in to 8 ounces of Juice/Water     pravastatin (PRAVACHOL) 40 MG tablet Take 40 mg by mouth at bedtime.      sevelamer carbonate (RENVELA) 800 MG tablet Take 2 tablets (1,600 mg total) by mouth 3 (three) times daily with meals. 180 tablet 0   No current facility-administered medications on file prior to visit.    There are no Patient Instructions on file for this visit. No follow-ups on file.   Kris Hartmann, NP

## 2022-06-03 ENCOUNTER — Other Ambulatory Visit: Payer: Medicare Other

## 2022-06-03 DIAGNOSIS — R972 Elevated prostate specific antigen [PSA]: Secondary | ICD-10-CM

## 2022-06-04 LAB — PSA: Prostate Specific Ag, Serum: 6.1 ng/mL — ABNORMAL HIGH (ref 0.0–4.0)

## 2022-06-05 ENCOUNTER — Encounter: Payer: Self-pay | Admitting: Urology

## 2022-06-05 ENCOUNTER — Ambulatory Visit (INDEPENDENT_AMBULATORY_CARE_PROVIDER_SITE_OTHER): Payer: Medicare Other | Admitting: Urology

## 2022-06-05 VITALS — BP 111/74 | HR 80 | Ht 67.0 in | Wt 160.0 lb

## 2022-06-05 DIAGNOSIS — R972 Elevated prostate specific antigen [PSA]: Secondary | ICD-10-CM

## 2022-06-05 NOTE — Progress Notes (Signed)
06/05/2022 2:10 PM   Lawrence [name] 1956/02/04 [id number]  Referring provider: Sharyne Peach, MD [place],  [place] [address]  Chief Complaint  Patient presents with   Elevated PSA    HPI: 66 y.o. male referred for follow-up of an elevated PSA.  Initially saw Dr. Erlene Quan 2017 for PSA of 3.2 which had increased from a prior PSA of 2.3.  DRE was benign and prn follow-up recommended Follow-up visit 04/2017 for PSA 7.91.  PSA was repeated and was 4.9 and continued monitoring recommended 11/2018 follow-up PSA was 6.09.  DRE was benign and continued monitoring was recommended He last saw Dr. Erlene Quan September 2020 and PSA was 14.8.  No change in DRE.  Noncontrast MRI was discussed and recommended however it does not look like this was ever scheduled PSA 08/28/2021 was 6.32 He has no complaints.  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 PSA 06/03/2022 stable 6.1    PMH: Past Medical History:  Diagnosis Date   Anemia    Cerebral hemorrhage ([place]) 2012   Chronic constipation    Chronic kidney disease    Diabetes mellitus without complication ([place])    Hemodialysis patient ([place])    Hyperlipidemia    Hypertension    Schizophrenia ([place])    Stroke [facility])     Surgical History: Past Surgical History:  Procedure Laterality Date   A/V FISTULAGRAM Left 02/24/2017   Procedure: A/V Fistulagram;  Surgeon: Katha Cabal, MD;  Location: [place] [facility];  Service: Cardiovascular;  Laterality: Left;   A/V FISTULAGRAM Left 09/03/2020   Procedure: A/V FISTULAGRAM;  Surgeon: Algernon Huxley, MD;  Location: [place] [facility];  Service: Cardiovascular;  Laterality: Left;   A/V FISTULAGRAM Left 06/27/2021   Procedure: A/V FISTULAGRAM;  Surgeon: Algernon Huxley, MD;  Location: [place] [facility];  Service: Cardiovascular;  Laterality: Left;   A/V FISTULAGRAM Left 07/24/2021   Procedure: A/V FISTULAGRAM;  Surgeon: Algernon Huxley, MD;  Location: [place] [facility];  Service:  Cardiovascular;  Laterality: Left;   A/V FISTULAGRAM Left 03/25/2022   Procedure: A/V Fistulagram;  Surgeon: Evaristo Bury, MD;  Location: [place] [facility];  Service: Cardiovascular;  Laterality: Left;   DIALYSIS/PERMA CATHETER INSERTION N/A 12/03/2021   Procedure: DIALYSIS/PERMA CATHETER INSERTION;  Surgeon: Algernon Huxley, MD;  Location: [place] [facility];  Service: Cardiovascular;  Laterality: N/A;   DIALYSIS/PERMA CATHETER INSERTION N/A 03/27/2022   Procedure: DIALYSIS/PERMA CATHETER INSERTION;  Surgeon: Algernon Huxley, MD;  Location: [place] [facility];  Service: Cardiovascular;  Laterality: N/A;   DIALYSIS/PERMA CATHETER REMOVAL N/A 03/13/2022   Procedure: DIALYSIS/PERMA CATHETER REMOVAL;  Surgeon: Algernon Huxley, MD;  Location: [place] [facility];  Service: Cardiovascular;  Laterality: N/A;   PERIPHERAL VASCULAR CATHETERIZATION N/A 04/17/2015   Procedure: Dialysis/Perma Catheter Insertion;  Surgeon: Katha Cabal, MD;  Location: [place] [facility];  Service: Cardiovascular;  Laterality: N/A;   PERIPHERAL VASCULAR CATHETERIZATION Left 07/30/2015   Procedure: A/V Shuntogram/Fistulagram;  Surgeon: Algernon Huxley, MD;  Location: [place] [facility];  Service: Cardiovascular;  Laterality: Left;   PERIPHERAL VASCULAR CATHETERIZATION Left 07/30/2015   Procedure: A/V Shunt Intervention;  Surgeon: Algernon Huxley, MD;  Location: [place] [facility];  Service: Cardiovascular;  Laterality: Left;   PERIPHERAL VASCULAR CATHETERIZATION N/A 08/20/2015   Procedure: DIALYSIS/PERMA CATHETER REMOVAL;  Surgeon: Algernon Huxley, MD;  Location: [place] [facility];  Service: Cardiovascular;  Laterality: N/A;  PERIPHERAL VASCULAR THROMBECTOMY Left 03/27/2022   Procedure: PERIPHERAL VASCULAR THROMBECTOMY;  Surgeon: Algernon Huxley, MD;  Location: [place] [facility];  Service: Cardiovascular;  Laterality: Left;   REVISON OF ARTERIOVENOUS FISTULA Left 12/04/2021   Procedure: REVISON OF ARTERIOVENOUS FISTULA;  Surgeon:  Algernon Huxley, MD;  Location: [facility];  Service: Vascular;  Laterality: Left;   THROMBECTOMY W/ EMBOLECTOMY Left 12/04/2021   Procedure: THROMBECTOMY ARTERIOVENOUS FISTULA;  Surgeon: Algernon Huxley, MD;  Location: [facility];  Service: Vascular;  Laterality: Left;   VASCULAR SURGERY     Fistula placement    Home Medications:  Allergies as of 06/05/2022       Reactions   Nsaids Other (See Comments)   Cannot take due to renal disease        Medication List        Accurate as of June 05, 2022  2:10 PM. If you have any questions, ask your nurse or doctor.          acetaminophen 500 MG tablet Commonly known as: TYLENOL Take 500 mg by mouth every 4 (four) hours as needed for mild pain.   aspirin EC 81 MG tablet Take 81 mg by mouth daily.   benztropine 0.5 MG tablet Commonly known as: COGENTIN Take 0.5 mg by mouth 2 (two) times daily.   carvedilol 25 MG tablet Commonly known as: COREG Take by mouth.   cetirizine 10 MG tablet Commonly known as: ZYRTEC Take 10 mg by mouth every other day.   Coenzyme Q10 100 MG capsule Take 100 mg by mouth at bedtime.   diphenhydrAMINE 25 mg capsule Commonly known as: BENADRYL Take 25 mg by mouth See admin instructions. Take 1 capsule ('25mg'$ ) by mouth nightly as needed for sleep - may take 1 capsule ('25mg'$ ) by mouth during the daytime as needed for itching   DIPHENHYDRAMINE-ZINC OXIDE EX Apply 1 application. topically See admin instructions. Apply small amount of affected area three times daily as needed   docusate sodium 100 MG capsule Commonly known as: COLACE Take 100 mg by mouth 2 (two) times daily.   docusate sodium 100 MG capsule Commonly known as: COLACE Take 200 mg by mouth daily as needed for mild constipation or moderate constipation.   feeding supplement Liqd Take 1 Container by mouth See admin instructions. Take 1 container by mouth three times a week or as needed of Nutritional Supplement   fluticasone 50 MCG/ACT nasal  spray Commonly known as: FLONASE Place 2 sprays into both nostrils daily.   glipiZIDE 2.5 MG 24 hr tablet Commonly known as: GLUCOTROL XL Take 2.5 mg by mouth daily with breakfast.   haloperidol 10 MG tablet Commonly known as: HALDOL Take 5-10 mg by mouth See admin instructions. Take 5 mg in the morning and 10 mg at bedtime   lidocaine-prilocaine cream Commonly known as: EMLA Apply 1 application topically 3 (three) times a week. Apply over dialysis shunt 30 minutes prior to dialysis.   midodrine 5 MG tablet Commonly known as: PROAMATINE Take 5 mg by mouth 3 (three) times a week. (Take 1 hour prior to dialysis)   mirtazapine 15 MG tablet Commonly known as: REMERON Take 15 mg by mouth at bedtime.   niacin 1000 MG CR tablet Commonly known as: NIASPAN Take 1,000 mg by mouth at bedtime.   polyethylene glycol 17 g packet Commonly known as: MIRALAX / GLYCOLAX Take 17 g by mouth daily as needed (Constipation). Mix 17 g (1 capful) in to 8  ounces of Juice/Water   pravastatin 40 MG tablet Commonly known as: PRAVACHOL Take 40 mg by mouth at bedtime.   sevelamer carbonate 800 MG tablet Commonly known as: RENVELA Take 2 tablets (1,600 mg total) by mouth 3 (three) times daily with meals.   Vitamin D3 125 MCG (5000 UT) Caps Take 5,000 Units by mouth once a week.        Allergies:  Allergies  Allergen Reactions   Nsaids Other (See Comments)    Cannot take due to renal disease    Family History: Family History  Problem Relation Age of Onset   Diabetes Mother    Kidney cancer Mother    Diabetes Sister    Stroke Brother    Prostate cancer Neg Hx    Bladder Cancer Neg Hx     Social History:  reports that he has never smoked. He has never used smokeless tobacco. He reports that he does not drink alcohol and does not use drugs.   Physical Exam: BP 111/74   Pulse 80   Ht '5\' 7"'$  (1.702 m)   Wt 160 lb (72.6 kg)   BMI 25.06 kg/m   Constitutional:  Alert, No acute  distress. HEENT: [place] AT Psychiatric: Normal mood and affect.   Assessment & Plan:    1.  Elevated PSA PSA stable 6.1 He desires to continue surveillance 6 month follow-up with PSA/DRE   Abbie Sons, MD  [facility] [address], [place], [place] [address] [phone]

## 2022-06-09 ENCOUNTER — Emergency Department: Payer: Medicare Other

## 2022-06-09 ENCOUNTER — Other Ambulatory Visit: Payer: Self-pay

## 2022-06-09 ENCOUNTER — Inpatient Hospital Stay
Admission: EM | Admit: 2022-06-09 | Discharge: 2022-06-13 | DRG: 252 | Disposition: A | Discharge status: Home / Self Care | Payer: Medicare Other | Attending: Internal Medicine | Admitting: Internal Medicine

## 2022-06-09 DIAGNOSIS — E875 Hyperkalemia: Secondary | ICD-10-CM | POA: Diagnosis not present

## 2022-06-09 DIAGNOSIS — Z8051 Family history of malignant neoplasm of kidney: Secondary | ICD-10-CM

## 2022-06-09 DIAGNOSIS — E785 Hyperlipidemia, unspecified: Secondary | ICD-10-CM

## 2022-06-09 DIAGNOSIS — D631 Anemia in chronic kidney disease: Secondary | ICD-10-CM | POA: Diagnosis present

## 2022-06-09 DIAGNOSIS — K5909 Other constipation: Secondary | ICD-10-CM | POA: Diagnosis present

## 2022-06-09 DIAGNOSIS — T82868A Thrombosis of vascular prosthetic devices, implants and grafts, initial encounter: Secondary | ICD-10-CM | POA: Diagnosis not present

## 2022-06-09 DIAGNOSIS — Z8673 Personal history of transient ischemic attack (TIA), and cerebral infarction without residual deficits: Secondary | ICD-10-CM

## 2022-06-09 DIAGNOSIS — D696 Thrombocytopenia, unspecified: Secondary | ICD-10-CM | POA: Diagnosis present

## 2022-06-09 DIAGNOSIS — I10 Essential (primary) hypertension: Secondary | ICD-10-CM

## 2022-06-09 DIAGNOSIS — Z886 Allergy status to analgesic agent status: Secondary | ICD-10-CM

## 2022-06-09 DIAGNOSIS — Z992 Dependence on renal dialysis: Secondary | ICD-10-CM

## 2022-06-09 DIAGNOSIS — I120 Hypertensive chronic kidney disease with stage 5 chronic kidney disease or end stage renal disease: Secondary | ICD-10-CM | POA: Diagnosis present

## 2022-06-09 DIAGNOSIS — Z20822 Contact with and (suspected) exposure to covid-19: Secondary | ICD-10-CM | POA: Diagnosis present

## 2022-06-09 DIAGNOSIS — Y832 Surgical operation with anastomosis, bypass or graft as the cause of abnormal reaction of the patient, or of later complication, without mention of misadventure at the time of the procedure: Secondary | ICD-10-CM | POA: Diagnosis present

## 2022-06-09 DIAGNOSIS — Z833 Family history of diabetes mellitus: Secondary | ICD-10-CM

## 2022-06-09 DIAGNOSIS — N186 End stage renal disease: Secondary | ICD-10-CM

## 2022-06-09 DIAGNOSIS — T82510A Breakdown (mechanical) of surgically created arteriovenous fistula, initial encounter: Secondary | ICD-10-CM | POA: Diagnosis not present

## 2022-06-09 DIAGNOSIS — Y712 Prosthetic and other implants, materials and accessory cardiovascular devices associated with adverse incidents: Secondary | ICD-10-CM | POA: Diagnosis present

## 2022-06-09 DIAGNOSIS — Z79899 Other long term (current) drug therapy: Secondary | ICD-10-CM

## 2022-06-09 DIAGNOSIS — Z7984 Long term (current) use of oral hypoglycemic drugs: Secondary | ICD-10-CM

## 2022-06-09 DIAGNOSIS — Z91158 Patient's noncompliance with renal dialysis for other reason: Secondary | ICD-10-CM

## 2022-06-09 DIAGNOSIS — Z823 Family history of stroke: Secondary | ICD-10-CM

## 2022-06-09 DIAGNOSIS — T82858A Stenosis of vascular prosthetic devices, implants and grafts, initial encounter: Secondary | ICD-10-CM | POA: Diagnosis not present

## 2022-06-09 DIAGNOSIS — E1122 Type 2 diabetes mellitus with diabetic chronic kidney disease: Secondary | ICD-10-CM | POA: Diagnosis present

## 2022-06-09 DIAGNOSIS — T82898A Other specified complication of vascular prosthetic devices, implants and grafts, initial encounter: Secondary | ICD-10-CM | POA: Diagnosis present

## 2022-06-09 DIAGNOSIS — T8249XA Other complication of vascular dialysis catheter, initial encounter: Secondary | ICD-10-CM

## 2022-06-09 DIAGNOSIS — F259 Schizoaffective disorder, unspecified: Secondary | ICD-10-CM

## 2022-06-09 DIAGNOSIS — E119 Type 2 diabetes mellitus without complications: Secondary | ICD-10-CM

## 2022-06-09 DIAGNOSIS — N2581 Secondary hyperparathyroidism of renal origin: Secondary | ICD-10-CM | POA: Diagnosis present

## 2022-06-09 DIAGNOSIS — T82590A Other mechanical complication of surgically created arteriovenous fistula, initial encounter: Secondary | ICD-10-CM | POA: Diagnosis present

## 2022-06-09 DIAGNOSIS — Z7982 Long term (current) use of aspirin: Secondary | ICD-10-CM

## 2022-06-09 LAB — CBC WITH DIFFERENTIAL/PLATELET
Abs Immature Granulocytes: 0.02 10*3/uL (ref 0.00–0.07)
Basophils Absolute: 0 10*3/uL (ref 0.0–0.1)
Basophils Relative: 1 %
Eosinophils Absolute: 0.2 10*3/uL (ref 0.0–0.5)
Eosinophils Relative: 4 %
HCT: 40.4 % (ref 39.0–52.0)
Hemoglobin: 12.5 g/dL — ABNORMAL LOW (ref 13.0–17.0)
Immature Granulocytes: 0 %
Lymphocytes Relative: 30 %
Lymphs Abs: 1.6 10*3/uL (ref 0.7–4.0)
MCH: 31.6 pg (ref 26.0–34.0)
MCHC: 30.9 g/dL (ref 30.0–36.0)
MCV: 102.3 fL — ABNORMAL HIGH (ref 80.0–100.0)
Monocytes Absolute: 0.6 10*3/uL (ref 0.1–1.0)
Monocytes Relative: 10 %
Neutro Abs: 3.1 10*3/uL (ref 1.7–7.7)
Neutrophils Relative %: 55 %
Platelets: 69 10*3/uL — ABNORMAL LOW (ref 150–400)
RBC: 3.95 MIL/uL — ABNORMAL LOW (ref 4.22–5.81)
RDW: 15.8 % — ABNORMAL HIGH (ref 11.5–15.5)
WBC: 5.5 10*3/uL (ref 4.0–10.5)
nRBC: 0 % (ref 0.0–0.2)

## 2022-06-09 NOTE — ED Notes (Signed)
Pt with left fistula. + pulse, no audible thrill. Pt said dialysis had no problems with fistula on Friday. Pt M,W,F dialysis and has not missed any treatments until today.

## 2022-06-09 NOTE — ED Provider Notes (Signed)
[facility] Emergency Department Provider Note     Event Date/Time   First MD Initiated Contact with Patient 06/09/22 1825     (approximate)   History   Vascular Access Problem   HPI  Lawrence [name] is a 66 y.o. male with end-stage renal disease on dialysis (MWF), chronic schizoaffective disorder, diabetes, and CVA presents to the ED for evaluation of his left fistula.  Patient presented today for routine dialysis, but left without treatment after the nurse could not establish at audible thrill. Patient said dialysis had no problems with fistula on Friday. Patient has not missed any treatments until today. He is otherwise without complaint.    Physical Exam   Triage Vital Signs: ED Triage Vitals  Enc Vitals Group     BP 06/09/22 1528 131/87     Pulse Rate 06/09/22 1528 71     Resp 06/09/22 1528 16     Temp 06/09/22 1528 98.4 F (36.9 C)     Temp Source 06/09/22 1528 Oral     SpO2 06/09/22 1528 97 %     Weight --      Height --      Head Circumference --      Peak Flow --      Pain Score 06/09/22 1506 0     Pain Loc --      Pain Edu? --      Excl. in [place]? --     Most recent vital signs: Vitals:   06/09/22 1528 06/09/22 2046  BP: 131/87 (!) 146/100  Pulse: 71 65  Resp: 16 16  Temp: 98.4 F (36.9 C)   SpO2: 97% 100%    General Awake, no distress. NAD CV:  Good peripheral perfusion. LUE proximal antecubital AV fistula with palpable pulse but without palpable or audible thrill. No distal LUE edema, induration noted RESP:  Normal effort.  ABD:  No distention.    ED Results / Procedures / Treatments   Labs (all labs ordered are listed, but only abnormal results are displayed) Labs Reviewed  COMPREHENSIVE METABOLIC PANEL - Abnormal; Notable for the following components:      Result Value   Potassium 6.2 (*)    BUN 89 (*)    Creatinine, Ser 14.62 (*)    Alkaline Phosphatase 150 (*)    GFR, Estimated 3 (*)    All other  components within normal limits  CBC WITH DIFFERENTIAL/PLATELET - Abnormal; Notable for the following components:   RBC 3.95 (*)    Hemoglobin 12.5 (*)    MCV 102.3 (*)    RDW 15.8 (*)    Platelets 69 (*)    All other components within normal limits  POTASSIUM - Abnormal; Notable for the following components:   Potassium 5.5 (*)    All other components within normal limits  PROTIME-INR     EKG    RADIOLOGY  I personally viewed and evaluated these images as part of my medical decision making, as well as reviewing the written report by the radiologist.  ED Provider Interpretation: evidence of RUE AV fistula thrombus on US}  No results found.   PROCEDURES:  Critical Care performed: No  Procedures   MEDICATIONS ORDERED IN ED: Medications - No data to display   IMPRESSION / MDM / [place] / ED COURSE  I reviewed the triage vital signs and the nursing notes.  Differential diagnosis includes, but is not limited to, AV fistula thrombus, cellulitis, DVT  Patient's presentation is most consistent with acute complicated illness / injury requiring diagnostic workup.  ----------------------------------------- 11:18 PM on 06/09/2022 ----------------------------------------- S/W Dr. Trula Slade: after labs available, speak to Nephrology to determine need for emergent dialysis He can place temp catheter for dialysis first thing tomorrow morning, if needed. Otherwise he has see the patient in the office for access management on Wednesday.  ----------------------------------------- 12:08 AM on 06/10/2022 ----------------------------------------- L/M for Baldo Daub, RN (Dialysis). Awaiting call back regarding patient labs and potential need for emergent dialysis.   ----------------------------------------- 12:30 AM on 06/10/2022 ----------------------------------------- Repeat potassium 5.5  mg/dl  ----------------------------------------- 12:56 AM on 06/10/2022 ----------------------------------------- S/W Dr. Holley Raring: he suggests patient be admitted to medically manage until (temporary) AV fistula can be replaced.   Patient's diagnosis is consistent with AV fistula thrombus. Patient is given ED precautions to return to the ED for any worsening or new symptoms.  Clinical Course as of 06/10/22 0058  Tue Jun 10, 2022  0057 CBC with Differential(!) [KM]    Clinical Course User Index [KM] Rada Hay, MD    FINAL CLINICAL IMPRESSION(S) / ED DIAGNOSES   Final diagnoses:  Hemodialysis AV fistula thrombosis, initial encounter [facility])     Rx / DC Orders   ED Discharge Orders     None        Note:  This document was prepared using Dragon voice recognition software and may include unintentional dictation errors.    Melvenia Needles, PA-C 06/10/22 0100    Duffy Bruce, MD 06/11/22 1105

## 2022-06-09 NOTE — ED Triage Notes (Signed)
Pt comes with dialysis problem. Pt states he went to dialysis today and they weren't able to access his catheter. Pt denies any symptoms or complaints.

## 2022-06-10 ENCOUNTER — Encounter: Payer: Self-pay | Admitting: Family Medicine

## 2022-06-10 DIAGNOSIS — T82898A Other specified complication of vascular prosthetic devices, implants and grafts, initial encounter: Secondary | ICD-10-CM | POA: Diagnosis not present

## 2022-06-10 DIAGNOSIS — E119 Type 2 diabetes mellitus without complications: Secondary | ICD-10-CM

## 2022-06-10 DIAGNOSIS — E785 Hyperlipidemia, unspecified: Secondary | ICD-10-CM

## 2022-06-10 DIAGNOSIS — Z992 Dependence on renal dialysis: Secondary | ICD-10-CM | POA: Diagnosis not present

## 2022-06-10 DIAGNOSIS — I10 Essential (primary) hypertension: Secondary | ICD-10-CM

## 2022-06-10 DIAGNOSIS — N186 End stage renal disease: Secondary | ICD-10-CM

## 2022-06-10 LAB — BASIC METABOLIC PANEL
Anion gap: 15 (ref 5–15)
BUN: 98 mg/dL — ABNORMAL HIGH (ref 8–23)
CO2: 27 mmol/L (ref 22–32)
Calcium: 9.1 mg/dL (ref 8.9–10.3)
Chloride: 97 mmol/L — ABNORMAL LOW (ref 98–111)
Creatinine, Ser: 16.02 mg/dL — ABNORMAL HIGH (ref 0.61–1.24)
GFR, Estimated: 3 mL/min — ABNORMAL LOW (ref >60)
Glucose, Bld: 157 mg/dL — ABNORMAL HIGH (ref 70–99)
Potassium: 5.8 mmol/L — ABNORMAL HIGH (ref 3.5–5.1)
Sodium: 139 mmol/L (ref 135–145)

## 2022-06-10 LAB — COMPREHENSIVE METABOLIC PANEL
ALT: 11 U/L (ref 0–44)
AST: 16 U/L (ref 15–41)
Albumin: 3.8 g/dL (ref 3.5–5.0)
Alkaline Phosphatase: 150 U/L — ABNORMAL HIGH (ref 38–126)
Anion gap: 13 (ref 5–15)
BUN: 89 mg/dL — ABNORMAL HIGH (ref 8–23)
CO2: 29 mmol/L (ref 22–32)
Calcium: 9.8 mg/dL (ref 8.9–10.3)
Chloride: 101 mmol/L (ref 98–111)
Creatinine, Ser: 14.62 mg/dL — ABNORMAL HIGH (ref 0.61–1.24)
GFR, Estimated: 3 mL/min — ABNORMAL LOW (ref >60)
Glucose, Bld: 91 mg/dL (ref 70–99)
Potassium: 6.2 mmol/L — ABNORMAL HIGH (ref 3.5–5.1)
Sodium: 143 mmol/L (ref 135–145)
Total Bilirubin: 1 mg/dL (ref 0.3–1.2)
Total Protein: 7.6 g/dL (ref 6.5–8.1)

## 2022-06-10 LAB — HEMOGLOBIN A1C
Hgb A1c MFr Bld: 4.9 % (ref 4.8–5.6)
Mean Plasma Glucose: 93.93 mg/dL

## 2022-06-10 LAB — CBC
HCT: 36.9 % — ABNORMAL LOW (ref 39.0–52.0)
Hemoglobin: 11.6 g/dL — ABNORMAL LOW (ref 13.0–17.0)
MCH: 31.9 pg (ref 26.0–34.0)
MCHC: 31.4 g/dL (ref 30.0–36.0)
MCV: 101.4 fL — ABNORMAL HIGH (ref 80.0–100.0)
Platelets: 76 10*3/uL — ABNORMAL LOW (ref 150–400)
RBC: 3.64 MIL/uL — ABNORMAL LOW (ref 4.22–5.81)
RDW: 15.9 % — ABNORMAL HIGH (ref 11.5–15.5)
WBC: 5.1 10*3/uL (ref 4.0–10.5)
nRBC: 0 % (ref 0.0–0.2)

## 2022-06-10 LAB — PROTIME-INR
INR: 1.1 (ref 0.8–1.2)
Prothrombin Time: 14.4 seconds (ref 11.4–15.2)

## 2022-06-10 LAB — GLUCOSE, CAPILLARY
Glucose-Capillary: 176 mg/dL — ABNORMAL HIGH (ref 70–99)
Glucose-Capillary: 134 mg/dL — ABNORMAL HIGH (ref 70–99)

## 2022-06-10 LAB — CBG MONITORING, ED
Glucose-Capillary: 117 mg/dL — ABNORMAL HIGH (ref 70–99)
Glucose-Capillary: 141 mg/dL — ABNORMAL HIGH (ref 70–99)

## 2022-06-10 LAB — POTASSIUM: Potassium: 5.5 mmol/L — ABNORMAL HIGH (ref 3.5–5.1)

## 2022-06-10 MED ORDER — PATIROMER SORBITEX CALCIUM 8.4 G PO PACK
16.8000 g | PACK | Freq: Every day | ORAL | Status: DC
  Administered 2022-06-10 – 2022-06-12 (×3): 16.8 g via ORAL
  Filled 2022-06-10 (×5): qty 2

## 2022-06-10 MED ORDER — DOCUSATE SODIUM 100 MG PO CAPS
100.0000 mg | ORAL_CAPSULE | Freq: Two times a day (BID) | ORAL | Status: DC
  Administered 2022-06-10 – 2022-06-12 (×5): 100 mg via ORAL
  Filled 2022-06-10 (×5): qty 1

## 2022-06-10 MED ORDER — HALOPERIDOL 5 MG PO TABS
5.0000 mg | ORAL_TABLET | Freq: Every morning | ORAL | Status: DC
  Administered 2022-06-10 – 2022-06-12 (×2): 5 mg via ORAL
  Filled 2022-06-10: qty 3
  Filled 2022-06-10: qty 1

## 2022-06-10 MED ORDER — ACETAMINOPHEN 650 MG RE SUPP: 650.0000 mg | Freq: Four times a day (QID) | RECTAL | Status: DC | PRN

## 2022-06-10 MED ORDER — HALOPERIDOL 5 MG PO TABS
10.0000 mg | ORAL_TABLET | Freq: Every day | ORAL | Status: DC
  Administered 2022-06-10 – 2022-06-12 (×4): 10 mg via ORAL
  Filled 2022-06-10: qty 2
  Filled 2022-06-10: qty 5
  Filled 2022-06-10 (×2): qty 2

## 2022-06-10 MED ORDER — TRAZODONE HCL 50 MG PO TABS: 25.0000 mg | ORAL_TABLET | Freq: Every evening | ORAL | Status: DC | PRN

## 2022-06-10 MED ORDER — VITAMIN D 25 MCG (1000 UNIT) PO TABS
5000.0000 [IU] | ORAL_TABLET | ORAL | Status: DC
Start: 2022-06-10 — End: 2022-06-13
  Administered 2022-06-10: 5000 [IU] via ORAL
  Filled 2022-06-10: qty 5

## 2022-06-10 MED ORDER — CARVEDILOL 25 MG PO TABS
25.0000 mg | ORAL_TABLET | Freq: Two times a day (BID) | ORAL | Status: DC
  Administered 2022-06-10 – 2022-06-12 (×3): 25 mg via ORAL
  Filled 2022-06-10 (×2): qty 1
  Filled 2022-06-10: qty 4
  Filled 2022-06-10 (×2): qty 1

## 2022-06-10 MED ORDER — NIACIN ER (ANTIHYPERLIPIDEMIC) 500 MG PO TBCR
1000.0000 mg | EXTENDED_RELEASE_TABLET | Freq: Every day | ORAL | Status: DC
  Administered 2022-06-10 – 2022-06-12 (×3): 1000 mg via ORAL
  Filled 2022-06-10 (×4): qty 2

## 2022-06-10 MED ORDER — MIDODRINE HCL 5 MG PO TABS
5.0000 mg | ORAL_TABLET | ORAL | Status: DC
  Administered 2022-06-13: 5 mg via ORAL
  Filled 2022-06-10: qty 1

## 2022-06-10 MED ORDER — LORATADINE 10 MG PO TABS
10.0000 mg | ORAL_TABLET | Freq: Every day | ORAL | Status: DC
  Administered 2022-06-10 – 2022-06-12 (×2): 10 mg via ORAL
  Filled 2022-06-10 (×2): qty 1

## 2022-06-10 MED ORDER — BENZTROPINE MESYLATE 0.5 MG PO TABS
0.5000 mg | ORAL_TABLET | Freq: Two times a day (BID) | ORAL | Status: DC
  Administered 2022-06-10 – 2022-06-12 (×5): 0.5 mg via ORAL
  Filled 2022-06-10 (×5): qty 1

## 2022-06-10 MED ORDER — ONDANSETRON HCL 4 MG/2ML IJ SOLN: 4.0000 mg | Freq: Four times a day (QID) | INTRAMUSCULAR | Status: DC | PRN

## 2022-06-10 MED ORDER — SEVELAMER CARBONATE 800 MG PO TABS
1600.0000 mg | ORAL_TABLET | Freq: Three times a day (TID) | ORAL | Status: DC
  Administered 2022-06-10 – 2022-06-12 (×4): 1600 mg via ORAL
  Filled 2022-06-10 (×6): qty 2

## 2022-06-10 MED ORDER — POLYETHYLENE GLYCOL 3350 17 G PO PACK: 17.0000 g | PACK | Freq: Every day | ORAL | Status: DC | PRN

## 2022-06-10 MED ORDER — ACETAMINOPHEN 325 MG PO TABS
650.0000 mg | ORAL_TABLET | Freq: Four times a day (QID) | ORAL | Status: DC | PRN
  Administered 2022-06-12: 650 mg via ORAL
  Filled 2022-06-10: qty 2

## 2022-06-10 MED ORDER — ASPIRIN 81 MG PO TBEC
81.0000 mg | DELAYED_RELEASE_TABLET | Freq: Every day | ORAL | Status: DC
  Administered 2022-06-10 – 2022-06-12 (×2): 81 mg via ORAL
  Filled 2022-06-10 (×2): qty 1

## 2022-06-10 MED ORDER — COENZYME Q10 100 MG PO CAPS: 100.0000 mg | ORAL_CAPSULE | Freq: Every day | ORAL | Status: DC

## 2022-06-10 MED ORDER — ONDANSETRON HCL 4 MG PO TABS: 4.0000 mg | ORAL_TABLET | Freq: Four times a day (QID) | ORAL | Status: DC | PRN

## 2022-06-10 MED ORDER — BOOST HIGH PROTEIN PO LIQD
1.0000 | ORAL | Status: DC
  Filled 2022-06-10: qty 237

## 2022-06-10 MED ORDER — HEPARIN SODIUM (PORCINE) 5000 UNIT/ML IJ SOLN
5000.0000 [IU] | Freq: Three times a day (TID) | INTRAMUSCULAR | Status: DC
  Administered 2022-06-10 – 2022-06-13 (×7): 5000 [IU] via SUBCUTANEOUS
  Filled 2022-06-10 (×8): qty 1

## 2022-06-10 MED ORDER — PRAVASTATIN SODIUM 20 MG PO TABS
40.0000 mg | ORAL_TABLET | Freq: Every day | ORAL | Status: DC
  Administered 2022-06-10 – 2022-06-12 (×4): 40 mg via ORAL
  Filled 2022-06-10 (×4): qty 2

## 2022-06-10 MED ORDER — MAGNESIUM HYDROXIDE 400 MG/5ML PO SUSP: 30.0000 mL | Freq: Every day | ORAL | Status: DC | PRN

## 2022-06-10 MED ORDER — GLIPIZIDE ER 2.5 MG PO TB24
2.5000 mg | ORAL_TABLET | Freq: Every day | ORAL | Status: DC
  Filled 2022-06-10 (×2): qty 1

## 2022-06-10 MED ORDER — MIRTAZAPINE 15 MG PO TABS
15.0000 mg | ORAL_TABLET | Freq: Every day | ORAL | Status: DC
  Administered 2022-06-10 – 2022-06-12 (×4): 15 mg via ORAL
  Filled 2022-06-10 (×4): qty 1

## 2022-06-10 MED ORDER — DIPHENHYDRAMINE HCL 25 MG PO CAPS: 25.0000 mg | ORAL_CAPSULE | Freq: Every evening | ORAL | Status: DC | PRN

## 2022-06-10 MED ORDER — FLUTICASONE PROPIONATE 50 MCG/ACT NA SUSP
2.0000 | Freq: Every day | NASAL | Status: DC
Start: 2022-06-10 — End: 2022-06-13
  Administered 2022-06-10 – 2022-06-12 (×2): 2 via NASAL
  Filled 2022-06-10: qty 16

## 2022-06-10 MED ORDER — INSULIN ASPART 100 UNIT/ML IJ SOLN
0.0000 [IU] | Freq: Three times a day (TID) | INTRAMUSCULAR | Status: DC
  Administered 2022-06-10: 1 [IU] via SUBCUTANEOUS
  Administered 2022-06-10: 2 [IU] via SUBCUTANEOUS
  Administered 2022-06-10: 1 [IU] via SUBCUTANEOUS
  Administered 2022-06-11: 2 [IU] via SUBCUTANEOUS
  Administered 2022-06-11 – 2022-06-12 (×2): 1 [IU] via SUBCUTANEOUS
  Filled 2022-06-10 (×7): qty 1

## 2022-06-10 NOTE — Assessment & Plan Note (Signed)
-   We will continue statin therapy. 

## 2022-06-10 NOTE — Progress Notes (Signed)
PHARMACIST - PHYSICIAN ORDER COMMUNICATION  CONCERNING: P&T Medication Policy on Herbal Medications  DESCRIPTION:  This patient's order for:  Co Enzyme Q10  has been noted.  This product(s) is classified as an "herbal" or natural product. Due to a lack of definitive safety studies or FDA approval, nonstandard manufacturing practices, plus the potential risk of unknown drug-drug interactions while on inpatient medications, the Pharmacy and Therapeutics Committee does not permit the use of "herbal" or natural products of this type within [place].   ACTION TAKEN: The pharmacy department is unable to verify this order at this time and your patient has been informed of this safety policy. Please reevaluate patient's clinical condition at discharge and address if the herbal or natural product(s) should be resumed at that time.   

## 2022-06-10 NOTE — H&P (View-Only) (Signed)
[facility] [place]  Patient name: Lawrence [name] MRN: [id number] DOB: [birth year] Sex: male   REQUESTING PROVIDER:    ER   REASON FOR CONSULT:    Clotted dialysis access  HISTORY OF PRESENT ILLNESS:   Lawrence [name] is a 66 y.o. male, who was sent to the ER from his dialysis center when they could not access his fistula.  The patient has a left arm fistula with jump graft.  His last dialysis was Friday.  His potassium was elevated when he arrived and this was treated.  It is come down to 5.5.  He is not having any shortness of breath and feels well.  He is not on anticoagulation.  The patient's renal failure secondary to diabetes and hypertension.  He does have a history of stroke.  He is a schizophrenic.  PAST MEDICAL HISTORY    Past Medical History:  Diagnosis Date   Anemia    Cerebral hemorrhage ([place]) 2012   Chronic constipation    Chronic kidney disease    Diabetes mellitus without complication ([place])    Hemodialysis patient ([place])    Hyperlipidemia    Hypertension    Schizophrenia ([place])    Stroke ([place])      FAMILY HISTORY   Family History  Problem Relation Age of Onset   Diabetes Mother    Kidney cancer Mother    Diabetes Sister    Stroke Brother    Prostate cancer Neg Hx    Bladder Cancer Neg Hx     SOCIAL HISTORY:   Social History   Socioeconomic History   Marital status: Single    Spouse name: Not on file   Number of children: Not on file   Years of education: Not on file   Highest education level: Not on file  Occupational History   Not on file  Tobacco Use   Smoking status: Never   Smokeless tobacco: Never  Substance and Sexual Activity   Alcohol use: No   Drug use: No   Sexual activity: Not Currently    Birth control/protection: None  Other Topics Concern   Not on file  Social History Narrative    Lives at group home: [facility] with 6 other people.   Social  Determinants of Health   Financial Resource Strain: Not on file  Food Insecurity: Not on file  Transportation Needs: Not on file  Physical Activity: Not on file  Stress: Not on file  Social Connections: Not on file  Intimate Partner Violence: Not on file    ALLERGIES:    Allergies  Allergen Reactions   Nsaids Other (See Comments)    Cannot take due to renal disease    CURRENT MEDICATIONS:    Current Facility-Administered Medications  Medication Dose [name] Frequency Provider Last Rate Last Admin   acetaminophen (TYLENOL) tablet 650 mg  650 mg Oral Q6H PRN Mansy, Jan A, MD       Or   acetaminophen (TYLENOL) suppository 650 mg  650 mg Rectal Q6H PRN Mansy, Jan A, MD       aspirin EC tablet 81 mg  81 mg Oral Daily Mansy, Jan A, MD   81 mg at 06/10/22 0810   benztropine (COGENTIN) tablet 0.5 mg  0.5 mg Oral BID Mansy, Jan A, MD   0.5 mg at 06/10/22 0811   carvedilol (COREG) tablet 25 mg  25 mg Oral BID WC Mansy, Arvella Merles, MD   25 mg  at 06/10/22 0810   cholecalciferol (VITAMIN D3) tablet 5,000 Units  5,000 Units Oral Weekly Mansy, Jan A, MD   5,000 Units at 06/10/22 0547   diphenhydrAMINE (BENADRYL) capsule 25 mg  25 mg Oral QHS PRN Mansy, Jan A, MD       docusate sodium (COLACE) capsule 100 mg  100 mg Oral BID Mansy, Jan A, MD   100 mg at 06/10/22 0810   feeding supplement (BOOST HIGH PROTEIN) liquid 237 mL  1 Container Oral See admin instructions Mansy, Jan A, MD       fluticasone (FLONASE) 50 MCG/ACT nasal spray 2 spray  2 spray Each Nare Daily Mansy, Jan A, MD   2 spray at 06/10/22 0926   haloperidol (HALDOL) tablet 10 mg  10 mg Oral QHS Mansy, Jan A, MD   10 mg at 06/10/22 0546   haloperidol (HALDOL) tablet 5 mg  5 mg Oral q morning Mansy, Jan A, MD       heparin injection 5,000 Units  5,000 Units Subcutaneous Q8H Mansy, Jan A, MD   5,000 Units at 06/10/22 0547   insulin aspart (novoLOG) injection 0-9 Units  0-9 Units Subcutaneous TID AC & HS Mansy, Jan A, MD       loratadine  (CLARITIN) tablet 10 mg  10 mg Oral Daily Mansy, Jan A, MD   10 mg at 06/10/22 7619   magnesium hydroxide (MILK OF MAGNESIA) suspension 30 mL  30 mL Oral Daily PRN Mansy, Jan A, MD       [START ON 06/11/2022] midodrine (PROAMATINE) tablet 5 mg  5 mg Oral Once per day on Mon Wed Fri Mansy, Jan A, MD       mirtazapine (REMERON) tablet 15 mg  15 mg Oral QHS Mansy, Jan A, MD   15 mg at 06/10/22 0548   niacin (NIASPAN) CR tablet 1,000 mg  1,000 mg Oral QHS Mansy, Jan A, MD       ondansetron [facility]) tablet 4 mg  4 mg Oral Q6H PRN Mansy, Jan A, MD       Or   ondansetron [facility]) injection 4 mg  4 mg Intravenous Q6H PRN Mansy, Jan A, MD       polyethylene glycol (MIRALAX / GLYCOLAX) packet 17 g  17 g Oral Daily PRN Mansy, Jan A, MD       pravastatin (PRAVACHOL) tablet 40 mg  40 mg Oral QHS Mansy, Jan A, MD   40 mg at 06/10/22 0547   sevelamer carbonate (RENVELA) tablet 1,600 mg  1,600 mg Oral TID WC Mansy, Jan A, MD   1,600 mg at 06/10/22 5093   traZODone (DESYREL) tablet 25 mg  25 mg Oral QHS PRN Mansy, Arvella Merles, MD       Current Outpatient Medications  Medication Sig Dispense Refill   aspirin EC 81 MG tablet Take 81 mg by mouth daily.      benztropine (COGENTIN) 0.5 MG tablet Take 0.5 mg by mouth 2 (two) times daily.     carvedilol (COREG) 25 MG tablet Take 25 mg by mouth 2 (two) times daily with a meal.     cetirizine (ZYRTEC) 10 MG tablet Take 10 mg by mouth every other day.     Cholecalciferol (VITAMIN D3) 125 MCG (5000 UT) CAPS Take 5,000 Units by mouth once a week.     Coenzyme Q10 100 MG capsule Take 100 mg by mouth at bedtime.     docusate sodium (COLACE) 100 MG capsule Take  100 mg by mouth 2 (two) times daily.     feeding supplement (BOOST HIGH PROTEIN) LIQD Take 1 Container by mouth See admin instructions. Take 1 container by mouth three times a week or as needed of Nutritional Supplement     fluticasone (FLONASE) 50 MCG/ACT nasal spray Place 2 sprays into both nostrils daily.     glipiZIDE  (GLUCOTROL XL) 2.5 MG 24 hr tablet Take 2.5 mg by mouth daily with breakfast.      haloperidol (HALDOL) 10 MG tablet Take 5-10 mg by mouth See admin instructions. Take 5 mg in the morning and 10 mg at bedtime     lidocaine-prilocaine (EMLA) cream Apply 1 application topically 3 (three) times a week. Apply over dialysis shunt 30 minutes prior to dialysis.     midodrine (PROAMATINE) 5 MG tablet Take 5 mg by mouth 3 (three) times a week. (Take 1 hour prior to dialysis)     mirtazapine (REMERON) 15 MG tablet Take 15 mg by mouth at bedtime.     niacin (NIASPAN) 1000 MG CR tablet Take 1,000 mg by mouth at bedtime.      pravastatin (PRAVACHOL) 40 MG tablet Take 40 mg by mouth at bedtime.      sevelamer carbonate (RENVELA) 800 MG tablet Take 2 tablets (1,600 mg total) by mouth 3 (three) times daily with meals. 180 tablet 0   acetaminophen (TYLENOL) 500 MG tablet Take 500 mg by mouth every 4 (four) hours as needed for mild pain.     diphenhydrAMINE (BENADRYL) 25 mg capsule Take 25 mg by mouth See admin instructions. Take 1 capsule ('25mg'$ ) by mouth nightly as needed for sleep - may take 1 capsule ('25mg'$ ) by mouth during the daytime as needed for itching     DIPHENHYDRAMINE-ZINC OXIDE EX Apply 1 application. topically See admin instructions. Apply small amount of affected area three times daily as needed     docusate sodium (COLACE) 100 MG capsule Take 200 mg by mouth daily as needed for mild constipation or moderate constipation.     polyethylene glycol (MIRALAX / GLYCOLAX) 17 g packet Take 17 g by mouth daily as needed (Constipation). Mix 17 g (1 capful) in to 8 ounces of Juice/Water      REVIEW OF SYSTEMS:   '[X]'$  denotes positive finding, '[ ]'$  denotes negative finding Cardiac  Comments:  Chest pain or chest pressure:    Shortness of breath upon exertion:    Short of breath when lying flat:    Irregular heart rhythm:        Vascular    Pain in calf, thigh, or hip brought on by ambulation:    Pain in  feet at night that wakes you up from your sleep:     Blood clot in your veins:    Leg swelling:         Pulmonary    Oxygen at home:    Productive cough:     Wheezing:         Neurologic    Sudden weakness in arms or legs:     Sudden numbness in arms or legs:     Sudden onset of difficulty speaking or slurred speech:    Temporary loss of vision in one eye:     Problems with dizziness:         Gastrointestinal    Blood in stool:      Vomited blood:         Genitourinary    Burning  when urinating:     Blood in urine:        Psychiatric    Major depression:         Hematologic    Bleeding problems:    Problems with blood clotting too easily:        Skin    Rashes or ulcers:        Constitutional    Fever or chills:     PHYSICAL EXAM:   Vitals:   06/09/22 2046 06/10/22 0544 06/10/22 0823 06/10/22 1058  BP: (!) 146/100 118/85 (!) 135/95 132/88  Pulse: 65 84 91 92  Resp: 16 20 (!) 22 (!) 24  Temp:   97.7 F (36.5 C) 98.2 F (36.8 C)  TempSrc:   Oral Oral  SpO2: 100% 98% 100% 98%    GENERAL: The patient is a well-nourished male, in no acute distress. The vital signs are documented above. CARDIAC: There is a regular rate and rhythm.  VASCULAR: Left upper arm fistula is occluded.  There is a pulse proximally near the anastomosis. PULMONARY: Nonlabored respirations ABDOMEN: Soft and non-tender  MUSCULOSKELETAL: There are no major deformities or cyanosis. NEUROLOGIC: No focal weakness or paresthesias are detected. SKIN: There are no ulcers or rashes noted. PSYCHIATRIC: The patient has a normal affect.  STUDIES:   I have reviewed his ultrasound with the following findings: Evaluation of the brachiocephalic arteriovenous anastomosis shows patency although almost immediate thrombosis of the outflow cephalic vein is noted. The native subclavian vein beyond the cephalic vein is patent. Echogenic material is noted throughout the course of the cephalic vein  consistent with thrombosed AV fistula.  ASSESSMENT and PLAN   Occluded left arm fistula: I spoke with nephrology.  We will manage the patient's electrolytes today.  His volume status is normal.  He will be scheduled for a declot of his left arm access tomorrow.  He will be n.p.o. after midnight.   Leia Alf, MD, FACS [facility] [phone] Pager [phone]

## 2022-06-10 NOTE — Assessment & Plan Note (Signed)
-   We will continue Haldol and Cogentin.

## 2022-06-10 NOTE — H&P (Addendum)
[place]   PATIENT NAME: Lawrence [name]    MR#:  [id number]  DATE OF BIRTH:  1956/06/24  DATE OF ADMISSION:  06/09/2022  PRIMARY CARE PHYSICIAN: Sharyne Peach, MD   Patient is coming from: Home  REQUESTING/REFERRING PHYSICIAN: Linnell Fulling, [place]  CHIEF COMPLAINT:   Chief Complaint  Patient presents with   Vascular Access Problem    HISTORY OF PRESENT ILLNESS:  Lawrence [name] is a 66 y.o. African-American male with medical history significant for type 2 diabetes mellitus, hypertension, dyslipidemia, schizophrenia, CVA and chronic kidney disease, who presented to the emergency room after missing hemodialysis as he had no palpable thrill yesterday.  He denies any dyspnea or cough or wheezing.  No fever or chills.  No nausea or vomiting or abdominal pain.  His last completed hemodialysis session was on Friday.  No chest pain or palpitations.  No headache or dizziness or blurred vision.  No orthopnea or paroxysmal nocturnal dyspnea or worsening lower extremity edema.  ED Course: When he came to the ER vital signs were within normal.  Labs revealed a potassium of 6.2 and repeat level was 5.5 and BUN was 89 with a creatinine of 14.6.  Alkaline phosphatase was 150.  CBC showed anemia at baseline.  INR was 1.1 and PT 14.4.    Imaging: AV fistula ultrasound showed near complete thrombosis of the proximal cephalic AV fistula.  The patient was given 1 L bolus of IV lactated Ringer and 1 g of calcium gluconate.  The patient will be admitted to an observation medical telemetry bed for further evaluation and management. PAST MEDICAL HISTORY:   Past Medical History:  Diagnosis Date   Anemia    Cerebral hemorrhage ([place]) 2012   Chronic constipation    Chronic kidney disease    Diabetes mellitus without complication ([place])    Hemodialysis patient ([place])    Hyperlipidemia    Hypertension    Schizophrenia ([place])    Stroke [facility])     PAST SURGICAL HISTORY:   Past Surgical  History:  Procedure Laterality Date   A/V FISTULAGRAM Left 02/24/2017   Procedure: A/V Fistulagram;  Surgeon: Katha Cabal, MD;  Location: [place] [facility];  Service: Cardiovascular;  Laterality: Left;   A/V FISTULAGRAM Left 09/03/2020   Procedure: A/V FISTULAGRAM;  Surgeon: Algernon Huxley, MD;  Location: [place] [facility];  Service: Cardiovascular;  Laterality: Left;   A/V FISTULAGRAM Left 06/27/2021   Procedure: A/V FISTULAGRAM;  Surgeon: Algernon Huxley, MD;  Location: [facility];  Service: Cardiovascular;  Laterality: Left;   A/V FISTULAGRAM Left 07/24/2021   Procedure: A/V FISTULAGRAM;  Surgeon: Algernon Huxley, MD;  Location: [place] [facility];  Service: Cardiovascular;  Laterality: Left;   A/V FISTULAGRAM Left 03/25/2022   Procedure: A/V Fistulagram;  Surgeon: Evaristo Bury, MD;  Location: [place] [facility];  Service: Cardiovascular;  Laterality: Left;   DIALYSIS/PERMA CATHETER INSERTION N/A 12/03/2021   Procedure: DIALYSIS/PERMA CATHETER INSERTION;  Surgeon: Algernon Huxley, MD;  Location: [place] [facility];  Service: Cardiovascular;  Laterality: N/A;   DIALYSIS/PERMA CATHETER INSERTION N/A 03/27/2022   Procedure: DIALYSIS/PERMA CATHETER INSERTION;  Surgeon: Algernon Huxley, MD;  Location: [place] [facility];  Service: Cardiovascular;  Laterality: N/A;   DIALYSIS/PERMA CATHETER REMOVAL N/A 03/13/2022   Procedure: DIALYSIS/PERMA CATHETER REMOVAL;  Surgeon: Algernon Huxley, MD;  Location: [place] [facility];  Service: Cardiovascular;  Laterality: N/A;   PERIPHERAL  VASCULAR CATHETERIZATION N/A 04/17/2015   Procedure: Dialysis/Perma Catheter Insertion;  Surgeon: Katha Cabal, MD;  Location: [place] [facility];  Service: Cardiovascular;  Laterality: N/A;   PERIPHERAL VASCULAR CATHETERIZATION Left 07/30/2015   Procedure: A/V Shuntogram/Fistulagram;  Surgeon: Algernon Huxley, MD;  Location: [place] [facility];  Service: Cardiovascular;  Laterality: Left;   PERIPHERAL VASCULAR  CATHETERIZATION Left 07/30/2015   Procedure: A/V Shunt Intervention;  Surgeon: Algernon Huxley, MD;  Location: [place] [facility];  Service: Cardiovascular;  Laterality: Left;   PERIPHERAL VASCULAR CATHETERIZATION N/A 08/20/2015   Procedure: DIALYSIS/PERMA CATHETER REMOVAL;  Surgeon: Algernon Huxley, MD;  Location: [place] [facility];  Service: Cardiovascular;  Laterality: N/A;   PERIPHERAL VASCULAR THROMBECTOMY Left 03/27/2022   Procedure: PERIPHERAL VASCULAR THROMBECTOMY;  Surgeon: Algernon Huxley, MD;  Location: [place] [facility];  Service: Cardiovascular;  Laterality: Left;   REVISON OF ARTERIOVENOUS FISTULA Left 12/04/2021   Procedure: REVISON OF ARTERIOVENOUS FISTULA;  Surgeon: Algernon Huxley, MD;  Location: [facility];  Service: Vascular;  Laterality: Left;   THROMBECTOMY W/ EMBOLECTOMY Left 12/04/2021   Procedure: THROMBECTOMY ARTERIOVENOUS FISTULA;  Surgeon: Algernon Huxley, MD;  Location: [facility];  Service: Vascular;  Laterality: Left;   VASCULAR SURGERY     Fistula placement    SOCIAL HISTORY:   Social History   Tobacco Use   Smoking status: Never   Smokeless tobacco: Never  Substance Use Topics   Alcohol use: No    FAMILY HISTORY:   Family History  Problem Relation Age of Onset   Diabetes Mother    Kidney cancer Mother    Diabetes Sister    Stroke Brother    Prostate cancer Neg Hx    Bladder Cancer Neg Hx     DRUG ALLERGIES:   Allergies  Allergen Reactions   Nsaids Other (See Comments)    Cannot take due to renal disease    REVIEW OF SYSTEMS:   ROS As per history of present illness. All pertinent systems were reviewed above. Constitutional, HEENT, cardiovascular, respiratory, GI, GU, musculoskeletal, neuro, psychiatric, endocrine, integumentary and hematologic systems were reviewed and are otherwise negative/unremarkable except for positive findings mentioned above in the HPI.   MEDICATIONS AT HOME:   Prior to Admission medications   Medication Sig Start Date End  Date Taking? Authorizing Provider  acetaminophen (TYLENOL) 500 MG tablet Take 500 mg by mouth every 4 (four) hours as needed for mild pain.    [provider]  aspirin EC 81 MG tablet Take 81 mg by mouth daily.     [provider]  benztropine (COGENTIN) 0.5 MG tablet Take 0.5 mg by mouth 2 (two) times daily.    [provider]  carvedilol (COREG) 25 MG tablet Take by mouth. 04/04/22   [provider]  cetirizine (ZYRTEC) 10 MG tablet Take 10 mg by mouth every other day.    [provider]  Cholecalciferol (VITAMIN D3) 125 MCG (5000 UT) CAPS Take 5,000 Units by mouth once a week.    [provider]  Coenzyme Q10 100 MG capsule Take 100 mg by mouth at bedtime.    [provider]  diphenhydrAMINE (BENADRYL) 25 mg capsule Take 25 mg by mouth See admin instructions. Take 1 capsule ('25mg'$ ) by mouth nightly as needed for sleep - may take 1 capsule ('25mg'$ ) by mouth during the daytime as needed for itching    [provider]  DIPHENHYDRAMINE-ZINC OXIDE EX Apply 1 application. topically  See admin instructions. Apply small amount of affected area three times daily as needed    [provider]  docusate sodium (COLACE) 100 MG capsule Take 100 mg by mouth 2 (two) times daily.    [provider]  docusate sodium (COLACE) 100 MG capsule Take 200 mg by mouth daily as needed for mild constipation or moderate constipation.    [provider]  feeding supplement (BOOST HIGH PROTEIN) LIQD Take 1 Container by mouth See admin instructions. Take 1 container by mouth three times a week or as needed of Nutritional Supplement    [provider]  fluticasone (FLONASE) 50 MCG/ACT nasal spray Place 2 sprays into both nostrils daily.    [provider]  glipiZIDE (GLUCOTROL XL) 2.5 MG 24 hr tablet Take 2.5 mg by mouth daily with breakfast.     [provider]  haloperidol (HALDOL) 10 MG tablet Take 5-10 mg by  mouth See admin instructions. Take 5 mg in the morning and 10 mg at bedtime    [provider]  lidocaine-prilocaine (EMLA) cream Apply 1 application topically 3 (three) times a week. Apply over dialysis shunt 30 minutes prior to dialysis.    [provider]  midodrine (PROAMATINE) 5 MG tablet Take 5 mg by mouth 3 (three) times a week. (Take 1 hour prior to dialysis)    [provider]  mirtazapine (REMERON) 15 MG tablet Take 15 mg by mouth at bedtime.    [provider]  niacin (NIASPAN) 1000 MG CR tablet Take 1,000 mg by mouth at bedtime.     [provider]  polyethylene glycol (MIRALAX / GLYCOLAX) 17 g packet Take 17 g by mouth daily as needed (Constipation). Mix 17 g (1 capful) in to 8 ounces of Juice/Water    [provider]  pravastatin (PRAVACHOL) 40 MG tablet Take 40 mg by mouth at bedtime.     [provider]  sevelamer carbonate (RENVELA) 800 MG tablet Take 2 tablets (1,600 mg total) by mouth 3 (three) times daily with meals. 12/05/21   [name], MD      VITAL SIGNS:  Blood pressure (!) 146/100, pulse 65, temperature 98.4 F (36.9 C), temperature source Oral, resp. rate 16, SpO2 100 %.  PHYSICAL EXAMINATION:  Physical Exam  GENERAL:  65 y.o.-year-old African-American male patient lying in the bed with no acute distress.  EYES: Pupils equal, round, reactive to light and accommodation. No scleral icterus. Extraocular muscles intact.  HEENT: Head atraumatic, normocephalic. Oropharynx and nasopharynx clear.  NECK:  Supple, no jugular venous distention. No thyroid enlargement, no tenderness.  LUNGS: Normal breath sounds bilaterally, no wheezing, rales,rhonchi or crepitation. No use of accessory muscles of respiration.  CARDIOVASCULAR: Regular rate and rhythm, S1, S2 normal. No murmurs, rubs, or gallops.  No palpable thrill over his left upper extremity AV fistula. ABDOMEN: Soft, nondistended, nontender. Bowel  sounds present. No organomegaly or mass.  EXTREMITIES: No pedal edema, cyanosis, or clubbing.  NEUROLOGIC: Cranial nerves II through XII are intact. Muscle strength 5/5 in all extremities. Sensation intact. Gait not checked.  PSYCHIATRIC: The patient is alert and oriented x 3.  Normal affect and good eye contact. SKIN: No obvious rash, lesion, or ulcer.   LABORATORY PANEL:   CBC Recent Labs  Lab 06/09/22 2343  WBC 5.5  HGB 12.5*  HCT 40.4  PLT 69*   ------------------------------------------------------------------------------------------------------------------  Chemistries  Recent Labs  Lab 06/09/22 2343 06/10/22 0031  NA 143  --  K 6.2* 5.5*  CL 101  --   CO2 29  --   GLUCOSE 91  --   BUN 89*  --   CREATININE 14.62*  --   CALCIUM 9.8  --   AST 16  --   ALT 11  --   ALKPHOS 150*  --   BILITOT 1.0  --    ------------------------------------------------------------------------------------------------------------------  Cardiac Enzymes No results for input(s): "TROPONINI" in the last 168 hours. ------------------------------------------------------------------------------------------------------------------  RADIOLOGY:  [place] Dialysis Access  Result Date: 06/10/2022 CLINICAL DATA:  Inability to cannulate dialysis AV fistula EXAM: ULTRASOUND DIALYSIS ACCESS TECHNIQUE: Sonographic evaluation of the left upper extremity was performed for evaluation of the patient's known AV fistula. Additionally duplex evaluation was performed. COMPARISON:  Comparison is made with prior AV fistulagram from 03/27/2022 FINDINGS: Evaluation of the brachiocephalic arteriovenous anastomosis shows patency although almost immediate thrombosis of the outflow cephalic vein is noted. The native subclavian vein beyond the cephalic vein is patent. Echogenic material is noted throughout the course of the cephalic vein consistent with thrombosed AV fistula. IMPRESSION: Near complete thrombosis of the  brachiocephalic AV fistula as described. Vascular surgery consultation is recommended. Electronically Signed   By: Inez Catalina M.D.   On: 06/10/2022 01:38      IMPRESSION AND PLAN:  Assessment and Plan: * AV fistula occlusion [facility]) - The patient will be admitted medical telemetry observation bed. - Vascular surgery consult will be obtained. - Dr. Russella Dar was notified and is aware about the patient.  ESRD on dialysis [facility]) - Nephrology consult will be obtained. - Dr. Zollie Scale was notified and is aware about the patient. - We will continue Renvela. - The patient missed hemodialysis session on Monday. - His hyperkalemia was managed with 1 g of IV calcium gluconate. - We will follow potassium level.  Essential hypertension - We will continue Coreg  Controlled type 2 diabetes mellitus without complication, without long-term current use of insulin ([place]) - The patient will be placed on supplemental coverage with NovoLog. - We will continue Glucotrol XL.  Dyslipidemia - We will continue statin therapy.  Schizoaffective disorder ([place]) - We will continue Haldol and Cogentin.   DVT prophylaxis: Heparin.   Advanced Care Planning:  Code Status: full code. Family Communication:  The plan of care was discussed in details with the patient (and family). I answered all questions. The patient agreed to proceed with the above mentioned plan. Further management will depend upon hospital course. Disposition Plan: Back to previous home environment Consults called: Vascular surgery and nephrology. All the records are reviewed and case discussed with ED provider.  Status is: Observation   I certify that at the time of admission, it is my clinical judgment that the patient will require  hospital care extending less than 2 midnights.                            Dispo: The patient is from: Home              Anticipated d/c is to: Home              Patient currently is not medically stable to d/c.               Difficult to place patient: No  Christel Mormon M.D on 06/10/2022 at 2:26 AM  [facility]   From 7 PM-7 AM, contact night-coverage [email or web address]  CC: Primary care physician; Sharyne Peach,  MD

## 2022-06-10 NOTE — Assessment & Plan Note (Signed)
-   We will continue Coreg

## 2022-06-10 NOTE — Assessment & Plan Note (Addendum)
-   Nephrology consult will be obtained. - Dr. Zollie Scale was notified and is aware about the patient. - We will continue Renvela. - The patient missed hemodialysis session on Monday. - His hyperkalemia was managed with 1 g of IV calcium gluconate. - We will follow potassium level.

## 2022-06-10 NOTE — Assessment & Plan Note (Signed)
-   The patient will be placed on supplemental coverage with NovoLog. - We will continue Glucotrol XL.

## 2022-06-10 NOTE — Assessment & Plan Note (Signed)
-   The patient will be admitted medical telemetry observation bed. - Vascular surgery consult will be obtained. - Dr. Russella Dar was notified and is aware about the patient.

## 2022-06-10 NOTE — Progress Notes (Signed)
[facility]  ROUNDING NOTE   Subjective:   Patient well-known to [place] as we follow him for outpatient hemodialysis. Presents with a clotted left upper extremity AV access. Does not appear to be in volume overload at the moment. Serum potassium 5.5 at last check.  Objective:  Vital signs in last 24 hours:  Temp:  [97.7 F (36.5 C)-98.6 F (37 C)] 98.6 F (37 C) (07/04 1400) Pulse Rate:  [65-92] 89 (07/04 1400) Resp:  [16-24] 18 (07/04 1400) BP: (118-146)/(84-100) 118/84 (07/04 1400) SpO2:  [97 %-100 %] 97 % (07/04 1400)  Weight change:  There were no vitals filed for this visit.  Intake/Output: No intake/output data recorded.   Intake/Output this shift:  No intake/output data recorded.  Physical Exam: General: No acute distress  Head: Normocephalic, atraumatic. Moist oral mucosal membranes  Neck: Supple  Lungs:  Clear to auscultation, normal effort  Heart: S1S2 no rubs  Abdomen:  Soft, nontender, bowel sounds present  Extremities: Trace peripheral edema.  Neurologic: Awake, alert, following commands  Skin: No acute rash  Access: Clotted left upper extremity AV access    Basic Metabolic Panel: Recent Labs  Lab 06/09/22 2343 06/10/22 0031  NA 143  --   K 6.2* 5.5*  CL 101  --   CO2 29  --   GLUCOSE 91  --   BUN 89*  --   CREATININE 14.62*  --   CALCIUM 9.8  --     Liver Function Tests: Recent Labs  Lab 06/09/22 2343  AST 16  ALT 11  ALKPHOS 150*  BILITOT 1.0  PROT 7.6  ALBUMIN 3.8   No results for input(s): "LIPASE", "AMYLASE" in the last 168 hours. No results for input(s): "AMMONIA" in the last 168 hours.  CBC: Recent Labs  Lab 06/09/22 2343 06/10/22 0521  WBC 5.5 5.1  NEUTROABS 3.1  --   HGB 12.5* 11.6*  HCT 40.4 36.9*  MCV 102.3* 101.4*  PLT 69* 76*    Cardiac Enzymes: No results for input(s): "CKTOTAL", "CKMB", "CKMBINDEX", "TROPONINI" in the last 168 hours.  BNP: Invalid input(s): "POCBNP"  CBG: Recent Labs   Lab 06/10/22 0843 06/10/22 1156  GLUCAP 117* 141*    Microbiology: Results for orders placed or performed during the hospital encounter of 11/29/21  Resp Panel by RT-PCR (Flu A&B, Covid) Nasopharyngeal Swab     Status: None   Collection Time: 11/29/21  6:45 PM   Specimen: Nasopharyngeal Swab; Nasopharyngeal(NP) swabs in vial transport medium  Result Value Ref Range Status   SARS Coronavirus 2 by RT PCR NEGATIVE NEGATIVE Final    Comment: (NOTE) SARS-CoV-2 target nucleic acids are NOT DETECTED.  The SARS-CoV-2 RNA is generally detectable in upper respiratory specimens during the acute phase of infection. The lowest concentration of SARS-CoV-2 viral copies this assay can detect is 138 copies/mL. A negative result does not preclude SARS-Cov-2 infection and should not be used as the sole basis for treatment or other patient management decisions. A negative result may occur with  improper specimen collection/handling, submission of specimen other than nasopharyngeal swab, presence of viral mutation(s) within the areas targeted by this assay, and inadequate number of viral copies(<138 copies/mL). A negative result must be combined with clinical observations, patient history, and epidemiological information. The expected result is Negative.  Fact Sheet for Patients:  EntrepreneurPulse.com.au  Fact Sheet for Healthcare Providers:  IncredibleEmployment.be  This test is no t yet approved or cleared by the [place] FDA and  has  been authorized for detection and/or diagnosis of SARS-CoV-2 by FDA under an Emergency Use Authorization (EUA). This EUA will remain  in effect (meaning this test can be used) for the duration of the COVID-19 declaration under Section 564(b)(1) of the Act, 21 U.S.C.section 360bbb-3(b)(1), unless the authorization is terminated  or revoked sooner.       Influenza A by PCR NEGATIVE NEGATIVE Final   Influenza B by PCR  NEGATIVE NEGATIVE Final    Comment: (NOTE) The Xpert Xpress SARS-CoV-2/FLU/RSV plus assay is intended as an aid in the diagnosis of influenza from Nasopharyngeal swab specimens and should not be used as a sole basis for treatment. Nasal washings and aspirates are unacceptable for Xpert Xpress SARS-CoV-2/FLU/RSV testing.  Fact Sheet for Patients: EntrepreneurPulse.com.au  Fact Sheet for Healthcare Providers: IncredibleEmployment.be  This test is not yet approved or cleared by the [place] FDA and has been authorized for detection and/or diagnosis of SARS-CoV-2 by FDA under an Emergency Use Authorization (EUA). This EUA will remain in effect (meaning this test can be used) for the duration of the COVID-19 declaration under Section 564(b)(1) of the Act, 21 U.S.C. section 360bbb-3(b)(1), unless the authorization is terminated or revoked.  Performed at [facility], [address] [place], [place] [address]     Coagulation Studies: Recent Labs    06/10/22 0004  LABPROT 14.4  INR 1.1    Urinalysis: No results for input(s): "COLORURINE", "LABSPEC", "PHURINE", "GLUCOSEU", "HGBUR", "BILIRUBINUR", "KETONESUR", "PROTEINUR", "UROBILINOGEN", "NITRITE", "LEUKOCYTESUR" in the last 72 hours.  Invalid input(s): "APPERANCEUR"    Imaging: [place] Dialysis Access  Result Date: 06/10/2022 CLINICAL DATA:  Inability to cannulate dialysis AV fistula EXAM: ULTRASOUND DIALYSIS ACCESS TECHNIQUE: Sonographic evaluation of the left upper extremity was performed for evaluation of the patient's known AV fistula. Additionally duplex evaluation was performed. COMPARISON:  Comparison is made with prior AV fistulagram from 03/27/2022 FINDINGS: Evaluation of the brachiocephalic arteriovenous anastomosis shows patency although almost immediate thrombosis of the outflow cephalic vein is noted. The native subclavian vein beyond the cephalic vein is patent. Echogenic  material is noted throughout the course of the cephalic vein consistent with thrombosed AV fistula. IMPRESSION: Near complete thrombosis of the brachiocephalic AV fistula as described. Vascular surgery consultation is recommended. Electronically Signed   By: Inez Catalina M.D.   On: 06/10/2022 01:38     Medications:     aspirin EC  81 mg Oral Daily   benztropine  0.5 mg Oral BID   carvedilol  25 mg Oral BID WC   cholecalciferol  5,000 Units Oral Weekly   docusate sodium  100 mg Oral BID   feeding supplement  1 Container Oral See admin instructions   fluticasone  2 spray Each Nare Daily   haloperidol  10 mg Oral QHS   haloperidol  5 mg Oral q morning   heparin  5,000 Units Subcutaneous Q8H   insulin aspart  0-9 Units Subcutaneous TID AC & HS   loratadine  10 mg Oral Daily   [START ON 06/11/2022] midodrine  5 mg Oral Once per day on Mon Wed Fri   mirtazapine  15 mg Oral QHS   niacin  1,000 mg Oral QHS   pravastatin  40 mg Oral QHS   sevelamer carbonate  1,600 mg Oral TID WC   acetaminophen **OR** acetaminophen, diphenhydrAMINE, magnesium hydroxide, ondansetron **OR** ondansetron (ZOFRAN) IV, polyethylene glycol, traZODone  Assessment/ Plan:  66 y.o. male with past medical history of diabetes mellitus type 2, hypertension, hyperlipidemia,  schizophrenia, history of CVA, ESRD who presents with clotted access.  [facility]/MWF-2/LUE AV access  1.  ESRD on HD MWF/hyperkalemia/clotted dialysis access.  Patient's access is now clotted.  Patient does not appear to be in frank volume overload.  Hyperkalemia noted.  Start the patient on [place] 16.8 g daily.  Case discussed with vascular surgery.  Hopefully his access can be declotted tomorrow.  2.  Anemia of chronic kidney disease.  Hemoglobin 11.6.  Hold off on Mircera while here.  3.  Secondary hyperparathyroidism.  Maintain the patient on Renvela 1600 mg p.o. 3 times daily.  4.  Hypertension.  Maintain the patient on carvedilol.    LOS: 0 Tiffinie Caillier 7/4/20233:18 PM

## 2022-06-10 NOTE — ED Notes (Signed)
Simon Rhein, MD and made him aware that the results are in

## 2022-06-10 NOTE — Progress Notes (Signed)
PROGRESS NOTE   HPI was taken from Dr. Sidney Ace: Lawrence [name] is a 66 y.o. African-American male with medical history significant for type 2 diabetes mellitus, hypertension, dyslipidemia, schizophrenia, CVA and chronic kidney disease, who presented to the emergency room after missing hemodialysis as he had no palpable thrill yesterday.  He denies any dyspnea or cough or wheezing.  No fever or chills.  No nausea or vomiting or abdominal pain.  His last completed hemodialysis session was on Friday.  No chest pain or palpitations.  No headache or dizziness or blurred vision.  No orthopnea or paroxysmal nocturnal dyspnea or worsening lower extremity edema.  ED Course: When he came to the ER vital signs were within normal.  Labs revealed a potassium of 6.2 and repeat level was 5.5 and BUN was 89 with a creatinine of 14.6.  Alkaline phosphatase was 150.  CBC showed anemia at baseline.  INR was 1.1 and PT 14.4.     Imaging: AV fistula ultrasound showed near complete thrombosis of the proximal cephalic AV fistula.  The patient was given 1 L bolus of IV lactated Ringer and 1 g of calcium gluconate.  The patient will be admitted to an observation medical telemetry bed for further evaluation and management.   Lawrence [name]  [name] DOB: Jul 06, 1956 DOA: 06/09/2022 PCP: Sharyne Peach, MD   Assessment & Plan:   Principal Problem:   AV fistula occlusion [facility]) Active Problems:   ESRD on dialysis [facility])   Controlled type 2 diabetes mellitus without complication, without long-term current use of insulin ([place])   Essential hypertension   Dyslipidemia   Schizoaffective disorder ([place])  Assessment and Plan: AV fistula occlusion: unable to receive HD. Will go for de-clotting of AV fistula tomorrow as per vasc surg. NPO after midnight.Vasc surg following and recs apprec .   ESRD: on HD. Missed HD on Monday likely secondary AV fistula occlusion.   Hyperkalemia: secondary to missing HD. S/p IV  calcium gluconate given. Needs HD  Thrombocytopenia: etiology unclear. Will continue to monitor    HTN: continue on coreg    DM2: well controlled. Holding home dose of glipizide. Continue on SSI w/ accuchecks    HLD: continue on statin   Schizoaffective disorder: continue on home dose of haldol, benztropine      DVT prophylaxis: heparin sq Code Status:full  Family Communication:  Disposition Plan: likely d/c back to group home  Level of care: Telemetry Medical  Status is: Observation The patient remains OBS appropriate and will d/c before 2 midnights.    Consultants:  Vasc surg Nephro   Procedures:  Antimicrobials:  Subjective: Pt c/o fatigue   Objective: Vitals:   06/09/22 1528 06/09/22 2046 06/10/22 0544 06/10/22 0823  BP: 131/87 (!) 146/100 118/85   Pulse: 71 65 84   Resp: '16 16 20   '$ Temp: 98.4 F (36.9 C)   97.7 F (36.5 C)  TempSrc: Oral     SpO2: 97% 100% 98%    No intake or output data in the 24 hours ending 06/10/22 0842 There were no vitals filed for this visit.  Examination:  General exam: Appears calm and comfortable  Respiratory system: decreased breath sounds b/l  Cardiovascular system: S1 & S2 +. No rubs, gallops or clicks.  Gastrointestinal system: Abdomen is nondistended, soft and nontender.Normal bowel sounds heard. Central nervous system: Alert and oriented. Moves all extremities Psychiatry: Judgement and insight appear normal. Mood & affect appropriate.     Data Reviewed: I have  personally reviewed following labs and imaging studies  CBC: Recent Labs  Lab 06/09/22 2343 06/10/22 0521  WBC 5.5 5.1  NEUTROABS 3.1  --   HGB 12.5* 11.6*  HCT 40.4 36.9*  MCV 102.3* 101.4*  PLT 69* 76*   Basic Metabolic Panel: Recent Labs  Lab 06/09/22 2343 06/10/22 0031  NA 143  --   K 6.2* 5.5*  CL 101  --   CO2 29  --   GLUCOSE 91  --   BUN 89*  --   CREATININE 14.62*  --   CALCIUM 9.8  --    GFR: Estimated Creatinine  Clearance: 4.6 mL/min (A) (by C-G formula based on SCr of 14.62 mg/dL (H)). Liver Function Tests: Recent Labs  Lab 06/09/22 2343  AST 16  ALT 11  ALKPHOS 150*  BILITOT 1.0  PROT 7.6  ALBUMIN 3.8   No results for input(s): "LIPASE", "AMYLASE" in the last 168 hours. No results for input(s): "AMMONIA" in the last 168 hours. Coagulation Profile: Recent Labs  Lab 06/10/22 0004  INR 1.1   Cardiac Enzymes: No results for input(s): "CKTOTAL", "CKMB", "CKMBINDEX", "TROPONINI" in the last 168 hours. BNP (last 3 results) No results for input(s): "PROBNP" in the last 8760 hours. HbA1C: No results for input(s): "HGBA1C" in the last 72 hours. CBG: No results for input(s): "GLUCAP" in the last 168 hours. Lipid Profile: No results for input(s): "CHOL", "HDL", "LDLCALC", "TRIG", "CHOLHDL", "LDLDIRECT" in the last 72 hours. Thyroid Function Tests: No results for input(s): "TSH", "T4TOTAL", "FREET4", "T3FREE", "THYROIDAB" in the last 72 hours. Anemia Panel: No results for input(s): "VITAMINB12", "FOLATE", "FERRITIN", "TIBC", "IRON", "RETICCTPCT" in the last 72 hours. Sepsis Labs: No results for input(s): "PROCALCITON", "LATICACIDVEN" in the last 168 hours.  No results found for this or any previous visit (from the past 240 hour(s)).       Radiology Studies: [place] Dialysis Access  Result Date: 06/10/2022 CLINICAL DATA:  Inability to cannulate dialysis AV fistula EXAM: ULTRASOUND DIALYSIS ACCESS TECHNIQUE: Sonographic evaluation of the left upper extremity was performed for evaluation of the patient's known AV fistula. Additionally duplex evaluation was performed. COMPARISON:  Comparison is made with prior AV fistulagram from 03/27/2022 FINDINGS: Evaluation of the brachiocephalic arteriovenous anastomosis shows patency although almost immediate thrombosis of the outflow cephalic vein is noted. The native subclavian vein beyond the cephalic vein is patent. Echogenic material is noted throughout  the course of the cephalic vein consistent with thrombosed AV fistula. IMPRESSION: Near complete thrombosis of the brachiocephalic AV fistula as described. Vascular surgery consultation is recommended. Electronically Signed   By: Inez Catalina M.D.   On: 06/10/2022 01:38        Scheduled Meds:  aspirin EC  81 mg Oral Daily   benztropine  0.5 mg Oral BID   carvedilol  25 mg Oral BID WC   cholecalciferol  5,000 Units Oral Weekly   docusate sodium  100 mg Oral BID   feeding supplement  1 Container Oral See admin instructions   fluticasone  2 spray Each Nare Daily   glipiZIDE  2.5 mg Oral Q breakfast   haloperidol  10 mg Oral QHS   haloperidol  5 mg Oral q morning   heparin  5,000 Units Subcutaneous Q8H   insulin aspart  0-9 Units Subcutaneous TID AC & HS   loratadine  10 mg Oral Daily   [START ON 06/11/2022] midodrine  5 mg Oral Once per day on Mon Wed Fri   mirtazapine  15 mg Oral QHS   niacin  1,000 mg Oral QHS   pravastatin  40 mg Oral QHS   sevelamer carbonate  1,600 mg Oral TID WC   Continuous Infusions:   LOS: 0 days    Time spent: 35 mins    Wyvonnia Dusky, MD [facility] Pager [phone]  If 7PM-7AM, please contact night-coverage [email or web address] 06/10/2022, 8:42 AM

## 2022-06-10 NOTE — Plan of Care (Signed)
  Problem: Fluid Volume: Goal: Ability to maintain a balanced intake and output will improve Outcome: Progressing   Problem: Nutritional: Goal: Maintenance of adequate nutrition will improve Outcome: Progressing   Problem: Clinical Measurements: Goal: Ability to maintain clinical measurements within normal limits will improve Outcome: Progressing   Problem: Activity: Goal: Risk for activity intolerance will decrease Outcome: Progressing   Problem: Nutrition: Goal: Adequate nutrition will be maintained Outcome: Progressing

## 2022-06-10 NOTE — Consult Note (Signed)
[facility] [place]  Patient name: Lawrence [name] MRN: [id number] DOB: 1955/12/23 Sex: male   REQUESTING PROVIDER:    ER   REASON FOR CONSULT:    Clotted dialysis access  HISTORY OF PRESENT ILLNESS:   Lawrence [name] is a 66 y.o. male, who was sent to the ER from his dialysis center when they could not access his fistula.  The patient has a left arm fistula with jump graft.  His last dialysis was Friday.  His potassium was elevated when he arrived and this was treated.  It is come down to 5.5.  He is not having any shortness of breath and feels well.  He is not on anticoagulation.  The patient's renal failure secondary to diabetes and hypertension.  He does have a history of stroke.  He is a schizophrenic.  PAST MEDICAL HISTORY    Past Medical History:  Diagnosis Date   Anemia    Cerebral hemorrhage ([place]) 2012   Chronic constipation    Chronic kidney disease    Diabetes mellitus without complication ([place])    Hemodialysis patient ([place])    Hyperlipidemia    Hypertension    Schizophrenia ([place])    Stroke ([place])      FAMILY HISTORY   Family History  Problem Relation Age of Onset   Diabetes Mother    Kidney cancer Mother    Diabetes Sister    Stroke Brother    Prostate cancer Neg Hx    Bladder Cancer Neg Hx     SOCIAL HISTORY:   Social History   Socioeconomic History   Marital status: Single    Spouse name: Not on file   Number of children: Not on file   Years of education: Not on file   Highest education level: Not on file  Occupational History   Not on file  Tobacco Use   Smoking status: Never   Smokeless tobacco: Never  Substance and Sexual Activity   Alcohol use: No   Drug use: No   Sexual activity: Not Currently    Birth control/protection: None  Other Topics Concern   Not on file  Social History Narrative    Lives at group home: [facility] with 6 other people.   Social  Determinants of Health   Financial Resource Strain: Not on file  Food Insecurity: Not on file  Transportation Needs: Not on file  Physical Activity: Not on file  Stress: Not on file  Social Connections: Not on file  Intimate Partner Violence: Not on file    ALLERGIES:    Allergies  Allergen Reactions   Nsaids Other (See Comments)    Cannot take due to renal disease    CURRENT MEDICATIONS:    Current Facility-Administered Medications  Medication Dose Route Frequency Provider Last Rate Last Admin   acetaminophen (TYLENOL) tablet 650 mg  650 mg Oral Q6H PRN Mansy, Jan A, MD       Or   acetaminophen (TYLENOL) suppository 650 mg  650 mg Rectal Q6H PRN Mansy, Jan A, MD       aspirin EC tablet 81 mg  81 mg Oral Daily Mansy, Jan A, MD   81 mg at 06/10/22 0810   benztropine (COGENTIN) tablet 0.5 mg  0.5 mg Oral BID Mansy, Jan A, MD   0.5 mg at 06/10/22 0811   carvedilol (COREG) tablet 25 mg  25 mg Oral BID WC Mansy, Arvella Merles, MD   25 mg  at 06/10/22 0810   cholecalciferol (VITAMIN D3) tablet 5,000 Units  5,000 Units Oral Weekly Mansy, Jan A, MD   5,000 Units at 06/10/22 0547   diphenhydrAMINE (BENADRYL) capsule 25 mg  25 mg Oral QHS PRN Mansy, Jan A, MD       docusate sodium (COLACE) capsule 100 mg  100 mg Oral BID Mansy, Jan A, MD   100 mg at 06/10/22 0810   feeding supplement (BOOST HIGH PROTEIN) liquid 237 mL  1 Container Oral See admin instructions Mansy, Jan A, MD       fluticasone (FLONASE) 50 MCG/ACT nasal spray 2 spray  2 spray Each Nare Daily Mansy, Jan A, MD   2 spray at 06/10/22 0926   haloperidol (HALDOL) tablet 10 mg  10 mg Oral QHS Mansy, Jan A, MD   10 mg at 06/10/22 0546   haloperidol (HALDOL) tablet 5 mg  5 mg Oral q morning Mansy, Jan A, MD       heparin injection 5,000 Units  5,000 Units Subcutaneous Q8H Mansy, Jan A, MD   5,000 Units at 06/10/22 0547   insulin aspart (novoLOG) injection 0-9 Units  0-9 Units Subcutaneous TID AC & HS Mansy, Jan A, MD       loratadine  (CLARITIN) tablet 10 mg  10 mg Oral Daily Mansy, Jan A, MD   10 mg at 06/10/22 9211   magnesium hydroxide (MILK OF MAGNESIA) suspension 30 mL  30 mL Oral Daily PRN Mansy, Jan A, MD       [START ON 06/11/2022] midodrine (PROAMATINE) tablet 5 mg  5 mg Oral Once per day on Mon Wed Fri Mansy, Jan A, MD       mirtazapine (REMERON) tablet 15 mg  15 mg Oral QHS Mansy, Jan A, MD   15 mg at 06/10/22 0548   niacin (NIASPAN) CR tablet 1,000 mg  1,000 mg Oral QHS Mansy, Jan A, MD       ondansetron [facility] An) tablet 4 mg  4 mg Oral Q6H PRN Mansy, Jan A, MD       Or   ondansetron [facility]) injection 4 mg  4 mg Intravenous Q6H PRN Mansy, Jan A, MD       polyethylene glycol (MIRALAX / GLYCOLAX) packet 17 g  17 g Oral Daily PRN Mansy, Jan A, MD       pravastatin (PRAVACHOL) tablet 40 mg  40 mg Oral QHS Mansy, Jan A, MD   40 mg at 06/10/22 0547   sevelamer carbonate (RENVELA) tablet 1,600 mg  1,600 mg Oral TID WC Mansy, Jan A, MD   1,600 mg at 06/10/22 9417   traZODone (DESYREL) tablet 25 mg  25 mg Oral QHS PRN Mansy, Arvella Merles, MD       Current Outpatient Medications  Medication Sig Dispense Refill   aspirin EC 81 MG tablet Take 81 mg by mouth daily.      benztropine (COGENTIN) 0.5 MG tablet Take 0.5 mg by mouth 2 (two) times daily.     carvedilol (COREG) 25 MG tablet Take 25 mg by mouth 2 (two) times daily with a meal.     cetirizine (ZYRTEC) 10 MG tablet Take 10 mg by mouth every other day.     Cholecalciferol (VITAMIN D3) 125 MCG (5000 UT) CAPS Take 5,000 Units by mouth once a week.     Coenzyme Q10 100 MG capsule Take 100 mg by mouth at bedtime.     docusate sodium (COLACE) 100 MG capsule Take  100 mg by mouth 2 (two) times daily.     feeding supplement (BOOST HIGH PROTEIN) LIQD Take 1 Container by mouth See admin instructions. Take 1 container by mouth three times a week or as needed of Nutritional Supplement     fluticasone (FLONASE) 50 MCG/ACT nasal spray Place 2 sprays into both nostrils daily.     glipiZIDE  (GLUCOTROL XL) 2.5 MG 24 hr tablet Take 2.5 mg by mouth daily with breakfast.      haloperidol (HALDOL) 10 MG tablet Take 5-10 mg by mouth See admin instructions. Take 5 mg in the morning and 10 mg at bedtime     lidocaine-prilocaine (EMLA) cream Apply 1 application topically 3 (three) times a week. Apply over dialysis shunt 30 minutes prior to dialysis.     midodrine (PROAMATINE) 5 MG tablet Take 5 mg by mouth 3 (three) times a week. (Take 1 hour prior to dialysis)     mirtazapine (REMERON) 15 MG tablet Take 15 mg by mouth at bedtime.     niacin (NIASPAN) 1000 MG CR tablet Take 1,000 mg by mouth at bedtime.      pravastatin (PRAVACHOL) 40 MG tablet Take 40 mg by mouth at bedtime.      sevelamer carbonate (RENVELA) 800 MG tablet Take 2 tablets (1,600 mg total) by mouth 3 (three) times daily with meals. 180 tablet 0   acetaminophen (TYLENOL) 500 MG tablet Take 500 mg by mouth every 4 (four) hours as needed for mild pain.     diphenhydrAMINE (BENADRYL) 25 mg capsule Take 25 mg by mouth See admin instructions. Take 1 capsule ('25mg'$ ) by mouth nightly as needed for sleep - may take 1 capsule ('25mg'$ ) by mouth during the daytime as needed for itching     DIPHENHYDRAMINE-ZINC OXIDE EX Apply 1 application. topically See admin instructions. Apply small amount of affected area three times daily as needed     docusate sodium (COLACE) 100 MG capsule Take 200 mg by mouth daily as needed for mild constipation or moderate constipation.     polyethylene glycol (MIRALAX / GLYCOLAX) 17 g packet Take 17 g by mouth daily as needed (Constipation). Mix 17 g (1 capful) in to 8 ounces of Juice/Water      REVIEW OF SYSTEMS:   '[X]'$  denotes positive finding, '[ ]'$  denotes negative finding Cardiac  Comments:  Chest pain or chest pressure:    Shortness of breath upon exertion:    Short of breath when lying flat:    Irregular heart rhythm:        Vascular    Pain in calf, thigh, or hip brought on by ambulation:    Pain in  feet at night that wakes you up from your sleep:     Blood clot in your veins:    Leg swelling:         Pulmonary    Oxygen at home:    Productive cough:     Wheezing:         Neurologic    Sudden weakness in arms or legs:     Sudden numbness in arms or legs:     Sudden onset of difficulty speaking or slurred speech:    Temporary loss of vision in one eye:     Problems with dizziness:         Gastrointestinal    Blood in stool:      Vomited blood:         Genitourinary    Burning  when urinating:     Blood in urine:        Psychiatric    Major depression:         Hematologic    Bleeding problems:    Problems with blood clotting too easily:        Skin    Rashes or ulcers:        Constitutional    Fever or chills:     PHYSICAL EXAM:   Vitals:   06/09/22 2046 06/10/22 0544 06/10/22 0823 06/10/22 1058  BP: (!) 146/100 118/85 (!) 135/95 132/88  Pulse: 65 84 91 92  Resp: 16 20 (!) 22 (!) 24  Temp:   97.7 F (36.5 C) 98.2 F (36.8 C)  TempSrc:   Oral Oral  SpO2: 100% 98% 100% 98%    GENERAL: The patient is a well-nourished male, in no acute distress. The vital signs are documented above. CARDIAC: There is a regular rate and rhythm.  VASCULAR: Left upper arm fistula is occluded.  There is a pulse proximally near the anastomosis. PULMONARY: Nonlabored respirations ABDOMEN: Soft and non-tender  MUSCULOSKELETAL: There are no major deformities or cyanosis. NEUROLOGIC: No focal weakness or paresthesias are detected. SKIN: There are no ulcers or rashes noted. PSYCHIATRIC: The patient has a normal affect.  STUDIES:   I have reviewed his ultrasound with the following findings: Evaluation of the brachiocephalic arteriovenous anastomosis shows patency although almost immediate thrombosis of the outflow cephalic vein is noted. The native subclavian vein beyond the cephalic vein is patent. Echogenic material is noted throughout the course of the cephalic vein  consistent with thrombosed AV fistula.  ASSESSMENT and PLAN   Occluded left arm fistula: I spoke with nephrology.  We will manage the patient's electrolytes today.  His volume status is normal.  He will be scheduled for a declot of his left arm access tomorrow.  He will be n.p.o. after midnight.   Leia Alf, MD, FACS [facility] [place] [facility] [phone] Pager [phone]

## 2022-06-11 ENCOUNTER — Inpatient Hospital Stay
Admission: EM | Disposition: A | Discharge status: Home / Self Care | Payer: Self-pay | Source: Home / Self Care | Attending: Internal Medicine

## 2022-06-11 ENCOUNTER — Encounter: Payer: Self-pay | Admitting: Vascular Surgery

## 2022-06-11 DIAGNOSIS — Z91158 Patient's noncompliance with renal dialysis for other reason: Secondary | ICD-10-CM | POA: Diagnosis not present

## 2022-06-11 DIAGNOSIS — Z833 Family history of diabetes mellitus: Secondary | ICD-10-CM | POA: Diagnosis not present

## 2022-06-11 DIAGNOSIS — E875 Hyperkalemia: Secondary | ICD-10-CM | POA: Diagnosis present

## 2022-06-11 DIAGNOSIS — T82590A Other mechanical complication of surgically created arteriovenous fistula, initial encounter: Secondary | ICD-10-CM | POA: Diagnosis not present

## 2022-06-11 DIAGNOSIS — Z886 Allergy status to analgesic agent status: Secondary | ICD-10-CM | POA: Diagnosis not present

## 2022-06-11 DIAGNOSIS — Z992 Dependence on renal dialysis: Secondary | ICD-10-CM | POA: Diagnosis not present

## 2022-06-11 DIAGNOSIS — D631 Anemia in chronic kidney disease: Secondary | ICD-10-CM | POA: Diagnosis present

## 2022-06-11 DIAGNOSIS — N186 End stage renal disease: Secondary | ICD-10-CM | POA: Diagnosis present

## 2022-06-11 DIAGNOSIS — Z823 Family history of stroke: Secondary | ICD-10-CM | POA: Diagnosis not present

## 2022-06-11 DIAGNOSIS — I120 Hypertensive chronic kidney disease with stage 5 chronic kidney disease or end stage renal disease: Secondary | ICD-10-CM | POA: Diagnosis present

## 2022-06-11 DIAGNOSIS — N2581 Secondary hyperparathyroidism of renal origin: Secondary | ICD-10-CM | POA: Diagnosis present

## 2022-06-11 DIAGNOSIS — Z79899 Other long term (current) drug therapy: Secondary | ICD-10-CM | POA: Diagnosis not present

## 2022-06-11 DIAGNOSIS — D696 Thrombocytopenia, unspecified: Secondary | ICD-10-CM | POA: Diagnosis present

## 2022-06-11 DIAGNOSIS — E1122 Type 2 diabetes mellitus with diabetic chronic kidney disease: Secondary | ICD-10-CM | POA: Diagnosis present

## 2022-06-11 DIAGNOSIS — Y712 Prosthetic and other implants, materials and accessory cardiovascular devices associated with adverse incidents: Secondary | ICD-10-CM | POA: Diagnosis present

## 2022-06-11 DIAGNOSIS — Z7982 Long term (current) use of aspirin: Secondary | ICD-10-CM | POA: Diagnosis not present

## 2022-06-11 DIAGNOSIS — Z7984 Long term (current) use of oral hypoglycemic drugs: Secondary | ICD-10-CM | POA: Diagnosis not present

## 2022-06-11 DIAGNOSIS — T82868A Thrombosis of vascular prosthetic devices, implants and grafts, initial encounter: Secondary | ICD-10-CM | POA: Diagnosis present

## 2022-06-11 DIAGNOSIS — K5909 Other constipation: Secondary | ICD-10-CM | POA: Diagnosis present

## 2022-06-11 DIAGNOSIS — Z8051 Family history of malignant neoplasm of kidney: Secondary | ICD-10-CM | POA: Diagnosis not present

## 2022-06-11 DIAGNOSIS — Z20822 Contact with and (suspected) exposure to covid-19: Secondary | ICD-10-CM | POA: Diagnosis present

## 2022-06-11 DIAGNOSIS — F259 Schizoaffective disorder, unspecified: Secondary | ICD-10-CM | POA: Diagnosis present

## 2022-06-11 DIAGNOSIS — T82898A Other specified complication of vascular prosthetic devices, implants and grafts, initial encounter: Secondary | ICD-10-CM | POA: Diagnosis not present

## 2022-06-11 DIAGNOSIS — E119 Type 2 diabetes mellitus without complications: Secondary | ICD-10-CM | POA: Diagnosis not present

## 2022-06-11 DIAGNOSIS — Z8673 Personal history of transient ischemic attack (TIA), and cerebral infarction without residual deficits: Secondary | ICD-10-CM | POA: Diagnosis not present

## 2022-06-11 DIAGNOSIS — T82858A Stenosis of vascular prosthetic devices, implants and grafts, initial encounter: Secondary | ICD-10-CM | POA: Diagnosis not present

## 2022-06-11 DIAGNOSIS — T82510A Breakdown (mechanical) of surgically created arteriovenous fistula, initial encounter: Secondary | ICD-10-CM | POA: Diagnosis present

## 2022-06-11 DIAGNOSIS — Y832 Surgical operation with anastomosis, bypass or graft as the cause of abnormal reaction of the patient, or of later complication, without mention of misadventure at the time of the procedure: Secondary | ICD-10-CM | POA: Diagnosis present

## 2022-06-11 DIAGNOSIS — E785 Hyperlipidemia, unspecified: Secondary | ICD-10-CM | POA: Diagnosis present

## 2022-06-11 HISTORY — PX: TEMPORARY DIALYSIS CATHETER: CATH118312

## 2022-06-11 LAB — BASIC METABOLIC PANEL
Anion gap: 15 (ref 5–15)
BUN: 102 mg/dL — ABNORMAL HIGH (ref 8–23)
CO2: 25 mmol/L (ref 22–32)
Calcium: 9.3 mg/dL (ref 8.9–10.3)
Chloride: 99 mmol/L (ref 98–111)
Creatinine, Ser: 16.25 mg/dL — ABNORMAL HIGH (ref 0.61–1.24)
GFR, Estimated: 3 mL/min — ABNORMAL LOW (ref >60)
Glucose, Bld: 99 mg/dL (ref 70–99)
Potassium: 6.6 mmol/L (ref 3.5–5.1)
Sodium: 139 mmol/L (ref 135–145)

## 2022-06-11 LAB — HEPATITIS B SURFACE ANTIGEN: Hepatitis B Surface Ag: NONREACTIVE

## 2022-06-11 LAB — GLUCOSE, CAPILLARY
Glucose-Capillary: 124 mg/dL — ABNORMAL HIGH (ref 70–99)
Glucose-Capillary: 113 mg/dL — ABNORMAL HIGH (ref 70–99)
Glucose-Capillary: 169 mg/dL — ABNORMAL HIGH (ref 70–99)

## 2022-06-11 LAB — CBC
HCT: 34.2 % — ABNORMAL LOW (ref 39.0–52.0)
Hemoglobin: 11.1 g/dL — ABNORMAL LOW (ref 13.0–17.0)
MCH: 32.1 pg (ref 26.0–34.0)
MCHC: 32.5 g/dL (ref 30.0–36.0)
MCV: 98.8 fL (ref 80.0–100.0)
Platelets: 72 10*3/uL — ABNORMAL LOW (ref 150–400)
RBC: 3.46 MIL/uL — ABNORMAL LOW (ref 4.22–5.81)
RDW: 15.4 % (ref 11.5–15.5)
WBC: 5.3 10*3/uL (ref 4.0–10.5)
nRBC: 0 % (ref 0.0–0.2)

## 2022-06-11 LAB — POTASSIUM
Potassium: 5.8 mmol/L — ABNORMAL HIGH (ref 3.5–5.1)
Potassium: 5.5 mmol/L — ABNORMAL HIGH (ref 3.5–5.1)

## 2022-06-11 LAB — HEPATITIS B CORE ANTIBODY, TOTAL: Hep B Core Total Ab: NONREACTIVE

## 2022-06-11 LAB — HEPATITIS C ANTIBODY: HCV Ab: NONREACTIVE

## 2022-06-11 LAB — HEPATITIS B SURFACE ANTIBODY,QUALITATIVE: Hep B S Ab: REACTIVE — AB

## 2022-06-11 SURGERY — TEMPORARY DIALYSIS CATHETER
Anesthesia: LOCAL

## 2022-06-11 MED ORDER — HEPARIN SODIUM (PORCINE) 1000 UNIT/ML DIALYSIS: 1000.0000 [IU] | INTRAMUSCULAR | Status: DC | PRN

## 2022-06-11 MED ORDER — SODIUM ZIRCONIUM CYCLOSILICATE 10 G PO PACK
10.0000 g | PACK | Freq: Once | ORAL | Status: AC
  Administered 2022-06-11: 10 g via ORAL
  Filled 2022-06-11: qty 1

## 2022-06-11 MED ORDER — ALTEPLASE 2 MG IJ SOLR: 2.0000 mg | Freq: Once | INTRAMUSCULAR | Status: DC | PRN

## 2022-06-11 MED ORDER — LIDOCAINE HCL (PF) 1 % IJ SOLN: 5.0000 mL | INTRAMUSCULAR | Status: DC | PRN

## 2022-06-11 MED ORDER — LIDOCAINE-PRILOCAINE 2.5-2.5 % EX CREA: 1.0000 | TOPICAL_CREAM | CUTANEOUS | Status: DC | PRN

## 2022-06-11 MED ORDER — HEPARIN SODIUM (PORCINE) 1000 UNIT/ML IJ SOLN
INTRAMUSCULAR | Status: AC
  Administered 2022-06-11: 3600 [IU] via INTRAVENOUS_CENTRAL
  Filled 2022-06-11: qty 10

## 2022-06-11 MED ORDER — CHLORHEXIDINE GLUCONATE CLOTH 2 % EX PADS
6.0000 | MEDICATED_PAD | Freq: Every day | CUTANEOUS | Status: DC
  Administered 2022-06-11 – 2022-06-13 (×3): 6 via TOPICAL

## 2022-06-11 MED ORDER — LIDOCAINE HCL (PF) 1 % IJ SOLN
INTRAMUSCULAR | Status: DC | PRN
  Administered 2022-06-11: 5 mL via INTRADERMAL

## 2022-06-11 MED ORDER — PENTAFLUOROPROP-TETRAFLUOROETH EX AERO: 1.0000 | INHALATION_SPRAY | CUTANEOUS | Status: DC | PRN

## 2022-06-11 SURGICAL SUPPLY — 3 items
COVER PROBE U/S 5X48 (MISCELLANEOUS) ×1 IMPLANT
KIT DIALYSIS CATH TRI 30X13 (CATHETERS) ×1 IMPLANT
PACK ANGIOGRAPHY (CUSTOM PROCEDURE TRAY) ×2 IMPLANT

## 2022-06-11 NOTE — Interval H&P Note (Signed)
History and Physical Interval Note:  06/11/2022 1:49 PM  Lawrence [name]  has presented today for surgery, with the diagnosis of clotted fistula.  The various methods of treatment have been discussed with the patient and family. After consideration of risks, benefits and other options for treatment, the patient has consented to  Procedure(s): A/V Fistulagram (N/A) as a surgical intervention.  The patient's history has been reviewed, patient examined, no change in status, stable for surgery.  I have reviewed the patient's chart and labs.  Questions were answered to the patient's satisfaction.     Hortencia Pilar

## 2022-06-11 NOTE — Progress Notes (Signed)
PROGRESS NOTE    Lawrence [name]  KWI:[id number] DOB: 12/22/1955 DOA: 06/09/2022 PCP: Sharyne Peach, MD    Brief Narrative:  66 year old gentleman with history of type 2 diabetes, hypertension, dyslipidemia, schizophrenia, ESRD came to the emergency room after missed hemodialysis due to losing access from AV fistula clotting.  He was sent to the ER from dialysis center.  Patient was without any complaints.  At the emergency room vitals are stable.  Potassium 6.2 and repeat potassium 5.5.  AV fistula ultrasound showed near complete thrombosis of the proximal cephalic AV fistula.  Patient was given 1 L of Ringer lactate and 1 g of calcium gluconate.   Assessment & Plan:   ESRD on hemodialysis Monday Wednesday Friday, missed hemodialysis AV fistula malfunction and occlusion Hyperkalemia  Currently hemodynamically stable. Potassium 6.6-1 dose of Lokelma-5.8.  No obvious fluid overload. Seen by vascular surgery, undergoing declotting of AV fistula today.  Will have subsequent hemodialysis after procedure.  If unable to use fistula, he will need a temporary dialysis access. Followed by nephrology.  Essential hypertension: Blood pressure fairly stable on current medications.  Type 2 diabetes , controlled: On Glucotrol at home.  Currently remains on sliding scale insulin.  Schizoaffective disorder: On Haldol and Cogentin.  Continue.   DVT prophylaxis: heparin injection 5,000 Units Start: 06/10/22 0145   Code Status: Full code Family Communication: None Disposition Plan: Status is: Inpatient Remains inpatient appropriate because: Inpatient procedures needed.  Hyperkalemia and inpatient dialysis needed.    Consultants:  Vascular surgery Nephrology  Procedures:  Going for procedure  Antimicrobials:  None   Subjective: I examined patient in the morning rounds.  Overnight events noted.  Was given a dose of Lokelma.  Patient himself denies any complaints.  Objective: Vitals:    06/10/22 2134 06/11/22 0610 06/11/22 0816 06/11/22 1344  BP: (!) 134/92 (!) 143/94 135/90 (!) 141/93  Pulse: 73 83 84 83  Resp: '17 18 19 '$ (!) 23  Temp: 97.8 F (36.6 C) 98 F (36.7 C) 98.1 F (36.7 C) 97.9 F (36.6 C)  TempSrc: Oral Oral Oral Oral  SpO2: 100% 96% 96% 95%  Height:        Intake/Output Summary (Last 24 hours) at 06/11/2022 1350 Last data filed at 06/10/2022 1700 Gross per 24 hour  Intake 240 ml  Output 0 ml  Net 240 ml   There were no vitals filed for this visit.  Examination:  General exam: Appears calm and comfortable  Pleasant comfortable on room air. Respiratory system: No added sounds. Cardiovascular system: S1 & S2 heard, RRR.  Gastrointestinal system: Soft.  Nontender.  Bowel sound present.   Left upper arm AV fistula, no thrill.    Data Reviewed: I have personally reviewed following labs and imaging studies  CBC: Recent Labs  Lab 06/09/22 2343 06/10/22 0521 06/11/22 0358  WBC 5.5 5.1 5.3  NEUTROABS 3.1  --   --   HGB 12.5* 11.6* 11.1*  HCT 40.4 36.9* 34.2*  MCV 102.3* 101.4* 98.8  PLT 69* 76* 72*   Basic Metabolic Panel: Recent Labs  Lab 06/09/22 2343 06/10/22 0031 06/10/22 1705 06/11/22 0358 06/11/22 1208  NA 143  --  139 139  --   K 6.2* 5.5* 5.8* 6.6* 5.8*  CL 101  --  97* 99  --   CO2 29  --  27 25  --   GLUCOSE 91  --  157* 99  --   BUN 89*  --  98* 102*  --  CREATININE 14.62*  --  16.02* 16.25*  --   CALCIUM 9.8  --  9.1 9.3  --    GFR: Estimated Creatinine Clearance: 4.2 mL/min (A) (by C-G formula based on SCr of 16.25 mg/dL (H)). Liver Function Tests: Recent Labs  Lab 06/09/22 2343  AST 16  ALT 11  ALKPHOS 150*  BILITOT 1.0  PROT 7.6  ALBUMIN 3.8   No results for input(s): "LIPASE", "AMYLASE" in the last 168 hours. No results for input(s): "AMMONIA" in the last 168 hours. Coagulation Profile: Recent Labs  Lab 06/10/22 0004  INR 1.1   Cardiac Enzymes: No results for input(s): "CKTOTAL", "CKMB",  "CKMBINDEX", "TROPONINI" in the last 168 hours. BNP (last 3 results) No results for input(s): "PROBNP" in the last 8760 hours. HbA1C: Recent Labs    06/10/22 0521  HGBA1C 4.9   CBG: Recent Labs  Lab 06/10/22 1156 06/10/22 1630 06/10/22 2128 06/11/22 0814 06/11/22 1138  GLUCAP 141* 176* 134* 124* 113*   Lipid Profile: No results for input(s): "CHOL", "HDL", "LDLCALC", "TRIG", "CHOLHDL", "LDLDIRECT" in the last 72 hours. Thyroid Function Tests: No results for input(s): "TSH", "T4TOTAL", "FREET4", "T3FREE", "THYROIDAB" in the last 72 hours. Anemia Panel: No results for input(s): "VITAMINB12", "FOLATE", "FERRITIN", "TIBC", "IRON", "RETICCTPCT" in the last 72 hours. Sepsis Labs: No results for input(s): "PROCALCITON", "LATICACIDVEN" in the last 168 hours.  No results found for this or any previous visit (from the past 240 hour(s)).       Radiology Studies: [place] Dialysis Access  Result Date: 06/10/2022 CLINICAL DATA:  Inability to cannulate dialysis AV fistula EXAM: ULTRASOUND DIALYSIS ACCESS TECHNIQUE: Sonographic evaluation of the left upper extremity was performed for evaluation of the patient's known AV fistula. Additionally duplex evaluation was performed. COMPARISON:  Comparison is made with prior AV fistulagram from 03/27/2022 FINDINGS: Evaluation of the brachiocephalic arteriovenous anastomosis shows patency although almost immediate thrombosis of the outflow cephalic vein is noted. The native subclavian vein beyond the cephalic vein is patent. Echogenic material is noted throughout the course of the cephalic vein consistent with thrombosed AV fistula. IMPRESSION: Near complete thrombosis of the brachiocephalic AV fistula as described. Vascular surgery consultation is recommended. Electronically Signed   By: Inez Catalina M.D.   On: 06/10/2022 01:38        Scheduled Meds:  [MAR Hold] aspirin EC  81 mg Oral Daily   [MAR Hold] benztropine  0.5 mg Oral BID   [MAR Hold]  carvedilol  25 mg Oral BID WC   [MAR Hold] Chlorhexidine Gluconate Cloth  6 each Topical Q0600   [MAR Hold] cholecalciferol  5,000 Units Oral Weekly   [MAR Hold] docusate sodium  100 mg Oral BID   [MAR Hold] feeding supplement  1 Container Oral See admin instructions   [MAR Hold] fluticasone  2 spray Each Nare Daily   [MAR Hold] haloperidol  10 mg Oral QHS   [MAR Hold] haloperidol  5 mg Oral q morning   [MAR Hold] heparin  5,000 Units Subcutaneous Q8H   [MAR Hold] insulin aspart  0-9 Units Subcutaneous TID AC & HS   [MAR Hold] loratadine  10 mg Oral Daily   [MAR Hold] midodrine  5 mg Oral Once per day on Mon Wed Fri   [facility] Hold] mirtazapine  15 mg Oral QHS   [MAR Hold] niacin  1,000 mg Oral QHS   [MAR Hold] patiromer  16.8 g Oral Daily   [MAR Hold] pravastatin  40 mg Oral QHS   [MAR Hold]  sevelamer carbonate  1,600 mg Oral TID WC   Continuous Infusions:   LOS: 0 days    Time spent: 35 minutes    Barb Merino, MD [facility] Pager [phone]

## 2022-06-11 NOTE — Progress Notes (Signed)
[facility]  ROUNDING NOTE   Subjective:   Patient seen sitting up in bed Alert and oriented Patient NPO for procedure States he was unable to receive dialysis on Monday, due to clotted access Remains on room air, no lower extremity edema   Objective:  Vital signs in last 24 hours:  Temp:  [97.8 F (36.6 C)-98.6 F (37 C)] 98.1 F (36.7 C) (07/05 0816) Pulse Rate:  [73-92] 84 (07/05 0816) Resp:  [16-19] 19 (07/05 0816) BP: (118-143)/(84-95) 135/90 (07/05 0816) SpO2:  [96 %-100 %] 96 % (07/05 0816)  Weight change:  There were no vitals filed for this visit.  Intake/Output: I/O last 3 completed shifts: In: 240 [P.O.:240] Out: 0    Intake/Output this shift:  No intake/output data recorded.  Physical Exam: General: No acute distress  Head: Normocephalic, atraumatic. Moist oral mucosal membranes  Lungs:  Clear to auscultation, normal effort  Heart: S1S2 no rubs  Abdomen:  Soft, nontender, bowel sounds present  Extremities: Trace peripheral edema.  Neurologic: Awake, alert, following commands  Skin: No acute rash  Access: Clotted left upper extremity AV access    Basic Metabolic Panel: Recent Labs  Lab 06/09/22 2343 06/10/22 0031 06/10/22 1705 06/11/22 0358  NA 143  --  139 139  K 6.2* 5.5* 5.8* 6.6*  CL 101  --  97* 99  CO2 29  --  27 25  GLUCOSE 91  --  157* 99  BUN 89*  --  98* 102*  CREATININE 14.62*  --  16.02* 16.25*  CALCIUM 9.8  --  9.1 9.3     Liver Function Tests: Recent Labs  Lab 06/09/22 2343  AST 16  ALT 11  ALKPHOS 150*  BILITOT 1.0  PROT 7.6  ALBUMIN 3.8    No results for input(s): "LIPASE", "AMYLASE" in the last 168 hours. No results for input(s): "AMMONIA" in the last 168 hours.  CBC: Recent Labs  Lab 06/09/22 2343 06/10/22 0521 06/11/22 0358  WBC 5.5 5.1 5.3  NEUTROABS 3.1  --   --   HGB 12.5* 11.6* 11.1*  HCT 40.4 36.9* 34.2*  MCV 102.3* 101.4* 98.8  PLT 69* 76* 72*     Cardiac Enzymes: No  results for input(s): "CKTOTAL", "CKMB", "CKMBINDEX", "TROPONINI" in the last 168 hours.  BNP: Invalid input(s): "POCBNP"  CBG: Recent Labs  Lab 06/10/22 1156 06/10/22 1630 06/10/22 2128 06/11/22 0814 06/11/22 1138  GLUCAP 141* 176* 134* 124* 113*     Microbiology: Results for orders placed or performed during the hospital encounter of 11/29/21  Resp Panel by RT-PCR (Flu A&B, Covid) Nasopharyngeal Swab     Status: None   Collection Time: 11/29/21  6:45 PM   Specimen: Nasopharyngeal Swab; Nasopharyngeal(NP) swabs in vial transport medium  Result Value Ref Range Status   SARS Coronavirus 2 by RT PCR NEGATIVE NEGATIVE Final    Comment: (NOTE) SARS-CoV-2 target nucleic acids are NOT DETECTED.  The SARS-CoV-2 RNA is generally detectable in upper respiratory specimens during the acute phase of infection. The lowest concentration of SARS-CoV-2 viral copies this assay can detect is 138 copies/mL. A negative result does not preclude SARS-Cov-2 infection and should not be used as the sole basis for treatment or other patient management decisions. A negative result may occur with  improper specimen collection/handling, submission of specimen other than nasopharyngeal swab, presence of viral mutation(s) within the areas targeted by this assay, and inadequate number of viral copies(<138 copies/mL). A negative result must be combined with  clinical observations, patient history, and epidemiological information. The expected result is Negative.  Fact Sheet for Patients:  EntrepreneurPulse.com.au  Fact Sheet for Healthcare Providers:  IncredibleEmployment.be  This test is no t yet approved or cleared by the [place] FDA and  has been authorized for detection and/or diagnosis of SARS-CoV-2 by FDA under an Emergency Use Authorization (EUA). This EUA will remain  in effect (meaning this test can be used) for the duration of the COVID-19  declaration under Section 564(b)(1) of the Act, 21 U.S.C.section 360bbb-3(b)(1), unless the authorization is terminated  or revoked sooner.       Influenza A by PCR NEGATIVE NEGATIVE Final   Influenza B by PCR NEGATIVE NEGATIVE Final    Comment: (NOTE) The Xpert Xpress SARS-CoV-2/FLU/RSV plus assay is intended as an aid in the diagnosis of influenza from Nasopharyngeal swab specimens and should not be used as a sole basis for treatment. Nasal washings and aspirates are unacceptable for Xpert Xpress SARS-CoV-2/FLU/RSV testing.  Fact Sheet for Patients: EntrepreneurPulse.com.au  Fact Sheet for Healthcare Providers: IncredibleEmployment.be  This test is not yet approved or cleared by the [place] FDA and has been authorized for detection and/or diagnosis of SARS-CoV-2 by FDA under an Emergency Use Authorization (EUA). This EUA will remain in effect (meaning this test can be used) for the duration of the COVID-19 declaration under Section 564(b)(1) of the Act, 21 U.S.C. section 360bbb-3(b)(1), unless the authorization is terminated or revoked.  Performed at [facility], [address] [place], [place] [address]     Coagulation Studies: Recent Labs    06/10/22 0004  LABPROT 14.4  INR 1.1     Urinalysis: No results for input(s): "COLORURINE", "LABSPEC", "PHURINE", "GLUCOSEU", "HGBUR", "BILIRUBINUR", "KETONESUR", "PROTEINUR", "UROBILINOGEN", "NITRITE", "LEUKOCYTESUR" in the last 72 hours.  Invalid input(s): "APPERANCEUR"    Imaging: [place] Dialysis Access  Result Date: 06/10/2022 CLINICAL DATA:  Inability to cannulate dialysis AV fistula EXAM: ULTRASOUND DIALYSIS ACCESS TECHNIQUE: Sonographic evaluation of the left upper extremity was performed for evaluation of the patient's known AV fistula. Additionally duplex evaluation was performed. COMPARISON:  Comparison is made with prior AV fistulagram from 03/27/2022 FINDINGS:  Evaluation of the brachiocephalic arteriovenous anastomosis shows patency although almost immediate thrombosis of the outflow cephalic vein is noted. The native subclavian vein beyond the cephalic vein is patent. Echogenic material is noted throughout the course of the cephalic vein consistent with thrombosed AV fistula. IMPRESSION: Near complete thrombosis of the brachiocephalic AV fistula as described. Vascular surgery consultation is recommended. Electronically Signed   By: Inez Catalina M.D.   On: 06/10/2022 01:38     Medications:     aspirin EC  81 mg Oral Daily   benztropine  0.5 mg Oral BID   carvedilol  25 mg Oral BID WC   Chlorhexidine Gluconate Cloth  6 each Topical Q0600   cholecalciferol  5,000 Units Oral Weekly   docusate sodium  100 mg Oral BID   feeding supplement  1 Container Oral See admin instructions   fluticasone  2 spray Each Nare Daily   haloperidol  10 mg Oral QHS   haloperidol  5 mg Oral q morning   heparin  5,000 Units Subcutaneous Q8H   insulin aspart  0-9 Units Subcutaneous TID AC & HS   loratadine  10 mg Oral Daily   midodrine  5 mg Oral Once per day on Mon Wed Fri   mirtazapine  15 mg Oral QHS   niacin  1,000 mg Oral  QHS   patiromer  16.8 g Oral Daily   pravastatin  40 mg Oral QHS   sevelamer carbonate  1,600 mg Oral TID WC   acetaminophen **OR** acetaminophen, alteplase, diphenhydrAMINE, heparin, lidocaine (PF), lidocaine-prilocaine, magnesium hydroxide, ondansetron **OR** ondansetron (ZOFRAN) IV, pentafluoroprop-tetrafluoroeth, polyethylene glycol, traZODone  Assessment/ Plan:  66 y.o. male with past medical history of diabetes mellitus type 2, hypertension, hyperlipidemia, schizophrenia, history of CVA, ESRD who presents with clotted access.  [facility]/[place]/MWF-2/LUE AV access  1.  ESRD on HD MWF/hyperkalemia/clotted dialysis access.  Patient's access is now clotted.   Consulted vascular to evaluate yesterday. Once procedure completed, will plan for  dialysis. Ordered Lokelma 10g for potassium 6.6. Will recheck potassium at noon.   2.  Anemia of chronic kidney disease.  Hemoglobin 11.1, acceptable.  Hold off on Mircera while here.  3.  Secondary hyperparathyroidism. Calcium remains within acceptable range.  Continue on Renvela 1600 mg p.o. 3 times daily.  4.  Hypertension.  Maintain the patient on carvedilol. Blood pressure 135/90   LOS: 0   7/5/202312:13 PM

## 2022-06-11 NOTE — Progress Notes (Signed)
Notified Neomia Glass NP of patients 6.6 potassium this morning. No new orders, will continue to monitor.

## 2022-06-11 NOTE — Op Note (Signed)
  OPERATIVE NOTE   PROCEDURE: Insertion of temporary dialysis catheter catheter right femoral approach.  PRE-OPERATIVE DIAGNOSIS: Hyperkalemia; end-stage renal disease on hemodialysis; thrombosis AV access  POST-OPERATIVE DIAGNOSIS: Same  SURGEON: Katha Cabal M.D.  ANESTHESIA: 1% lidocaine local infiltration  ESTIMATED BLOOD LOSS: Minimal cc  INDICATIONS:   Lawrence [name] is a 66 y.o. male who presents with thrombosis of his AV access.  Unfortunately, his potassium is too high to safely perform a thrombectomy and therefore temporary catheter is being inserted so that he may undergo urgent dialysis and we will plan for thrombectomy once his potassium is under better control.  Risks and benefits of been reviewed all questions answered patient agrees to proceed.  DESCRIPTION: After obtaining full informed written consent, the patient was positioned supine. The right groin was prepped and draped in a sterile fashion. Ultrasound was placed in a sterile sleeve. Ultrasound was utilized to identify the right common femoral vein which is noted to be echolucent and compressible indicating patency. Images recorded for the permanent record. Under real-time visualization a Seldinger needle is inserted into the vein and the guidewires advanced without difficulty. Small counterincision was made at the wire insertion site. Dilator is passed over the wire and the temporary dialysis catheter catheter is fed over the wire without difficulty.  All lumens aspirate and flush easily and are packed with heparin saline. Catheter secured to the skin of the right thigh with 2-0 silk. A sterile dressing is applied with Biopatch.  COMPLICATIONS: None  CONDITION: Unchanged  Hortencia Pilar Office:  [phone] 06/11/2022, 2:29 PM

## 2022-06-11 NOTE — Progress Notes (Signed)
Hemodialysis Post Treatment Note:  Tx date:06/11/2022 Tx time:  3 hours Access: right femoral catheter UF Removed: 1 L  Note: HD treatment completed. Tolerated well. No HD complications. Patient asymptomatic

## 2022-06-12 ENCOUNTER — Inpatient Hospital Stay
Admission: EM | Disposition: A | Discharge status: Home / Self Care | Payer: Self-pay | Source: Home / Self Care | Attending: Internal Medicine

## 2022-06-12 DIAGNOSIS — T82858A Stenosis of vascular prosthetic devices, implants and grafts, initial encounter: Secondary | ICD-10-CM

## 2022-06-12 DIAGNOSIS — T82898A Other specified complication of vascular prosthetic devices, implants and grafts, initial encounter: Secondary | ICD-10-CM | POA: Diagnosis not present

## 2022-06-12 HISTORY — PX: A/V FISTULAGRAM: CATH118298

## 2022-06-12 LAB — RENAL FUNCTION PANEL
Albumin: 3 g/dL — ABNORMAL LOW (ref 3.5–5.0)
Anion gap: 12 (ref 5–15)
BUN: 51 mg/dL — ABNORMAL HIGH (ref 8–23)
CO2: 25 mmol/L (ref 22–32)
Calcium: 8.8 mg/dL — ABNORMAL LOW (ref 8.9–10.3)
Chloride: 97 mmol/L — ABNORMAL LOW (ref 98–111)
Creatinine, Ser: 11.07 mg/dL — ABNORMAL HIGH (ref 0.61–1.24)
GFR, Estimated: 5 mL/min — ABNORMAL LOW (ref >60)
Glucose, Bld: 103 mg/dL — ABNORMAL HIGH (ref 70–99)
Phosphorus: 5.1 mg/dL — ABNORMAL HIGH (ref 2.5–4.6)
Potassium: 5.3 mmol/L — ABNORMAL HIGH (ref 3.5–5.1)
Sodium: 134 mmol/L — ABNORMAL LOW (ref 135–145)

## 2022-06-12 LAB — HEPATITIS B SURFACE ANTIBODY, QUANTITATIVE: Hep B S AB Quant (Post): 65.7 m[IU]/mL (ref >9.9)

## 2022-06-12 LAB — CBC
HCT: 34.9 % — ABNORMAL LOW (ref 39.0–52.0)
Hemoglobin: 11.3 g/dL — ABNORMAL LOW (ref 13.0–17.0)
MCH: 31.4 pg (ref 26.0–34.0)
MCHC: 32.4 g/dL (ref 30.0–36.0)
MCV: 96.9 fL (ref 80.0–100.0)
Platelets: 70 10*3/uL — ABNORMAL LOW (ref 150–400)
RBC: 3.6 MIL/uL — ABNORMAL LOW (ref 4.22–5.81)
RDW: 15.2 % (ref 11.5–15.5)
WBC: 4.1 10*3/uL (ref 4.0–10.5)
nRBC: 0 % (ref 0.0–0.2)

## 2022-06-12 LAB — GLUCOSE, CAPILLARY
Glucose-Capillary: 110 mg/dL — ABNORMAL HIGH (ref 70–99)
Glucose-Capillary: 102 mg/dL — ABNORMAL HIGH (ref 70–99)
Glucose-Capillary: 105 mg/dL — ABNORMAL HIGH (ref 70–99)
Glucose-Capillary: 131 mg/dL — ABNORMAL HIGH (ref 70–99)

## 2022-06-12 SURGERY — A/V FISTULAGRAM
Anesthesia: Moderate Sedation

## 2022-06-12 MED ORDER — CEFAZOLIN SODIUM-DEXTROSE 1-4 GM/50ML-% IV SOLN
INTRAVENOUS | Status: AC | PRN
  Administered 2022-06-12: 1 g via INTRAVENOUS

## 2022-06-12 MED ORDER — HEPARIN SODIUM (PORCINE) 1000 UNIT/ML IJ SOLN
INTRAMUSCULAR | Status: AC
  Filled 2022-06-12: qty 10

## 2022-06-12 MED ORDER — FAMOTIDINE 20 MG PO TABS: 40.0000 mg | ORAL_TABLET | Freq: Once | ORAL | Status: DC | PRN

## 2022-06-12 MED ORDER — SODIUM CHLORIDE 0.9 % IV SOLN: INTRAVENOUS | Status: DC

## 2022-06-12 MED ORDER — CEFAZOLIN SODIUM-DEXTROSE 1-4 GM/50ML-% IV SOLN
1.0000 g | INTRAVENOUS | Status: DC
  Filled 2022-06-12: qty 50

## 2022-06-12 MED ORDER — MIDAZOLAM HCL 5 MG/5ML IJ SOLN
INTRAMUSCULAR | Status: AC
  Filled 2022-06-12: qty 5

## 2022-06-12 MED ORDER — HYDROMORPHONE HCL 1 MG/ML IJ SOLN: 1.0000 mg | Freq: Once | INTRAMUSCULAR | Status: DC | PRN

## 2022-06-12 MED ORDER — ALTEPLASE 1 MG/ML SYRINGE FOR VASCULAR PROCEDURE
INTRAMUSCULAR | Status: DC | PRN
  Administered 2022-06-12: 10 mg via INTRA_ARTERIAL

## 2022-06-12 MED ORDER — MIDAZOLAM HCL 2 MG/ML PO SYRP: 8.0000 mg | ORAL_SOLUTION | Freq: Once | ORAL | Status: DC | PRN

## 2022-06-12 MED ORDER — METHYLPREDNISOLONE SODIUM SUCC 125 MG IJ SOLR: 125.0000 mg | Freq: Once | INTRAMUSCULAR | Status: DC | PRN

## 2022-06-12 MED ORDER — FENTANYL CITRATE (PF) 100 MCG/2ML IJ SOLN
INTRAMUSCULAR | Status: AC
  Filled 2022-06-12: qty 2

## 2022-06-12 MED ORDER — HEPARIN SODIUM (PORCINE) 1000 UNIT/ML IJ SOLN
2000.0000 [IU] | Freq: Once | INTRAMUSCULAR | Status: AC
  Administered 2022-06-12: 2000 [IU] via INTRAVENOUS

## 2022-06-12 MED ORDER — MIDAZOLAM HCL 2 MG/2ML IJ SOLN
INTRAMUSCULAR | Status: DC | PRN
  Administered 2022-06-12: 1 mg via INTRAVENOUS
  Administered 2022-06-12: 2 mg via INTRAVENOUS

## 2022-06-12 MED ORDER — DIPHENHYDRAMINE HCL 50 MG/ML IJ SOLN: 50.0000 mg | Freq: Once | INTRAMUSCULAR | Status: DC | PRN

## 2022-06-12 MED ORDER — ONDANSETRON HCL 4 MG/2ML IJ SOLN
4.0000 mg | Freq: Four times a day (QID) | INTRAMUSCULAR | Status: DC | PRN
Start: 2022-06-12 — End: 2022-06-12

## 2022-06-12 MED ORDER — FENTANYL CITRATE (PF) 100 MCG/2ML IJ SOLN
INTRAMUSCULAR | Status: DC | PRN
  Administered 2022-06-12: 25 ug via INTRAVENOUS
  Administered 2022-06-12: 50 ug via INTRAVENOUS

## 2022-06-12 SURGICAL SUPPLY — 21 items
BALLN LUTONIX AV 8X60X75 (BALLOONS) ×2
BALLN LUTONIX DCB 6X40X130 (BALLOONS) ×2
BALLN LUTONIX DCB 7X40X130 (BALLOONS) ×2
BALLOON LUTONIX AV 8X60X75 (BALLOONS) IMPLANT
BALLOON LUTONIX DCB 6X40X130 (BALLOONS) IMPLANT
BALLOON LUTONIX DCB 7X40X130 (BALLOONS) IMPLANT
CANISTER PENUMBRA ENGINE (MISCELLANEOUS) ×1 IMPLANT
CANNULA 5F STIFF (CANNULA) ×1 IMPLANT
CATH BEACON 5 .035 65 KMP TIP (CATHETERS) ×1 IMPLANT
CATH INDIGO 7D KIT (CATHETERS) ×1 IMPLANT
COVER PROBE U/S 5X48 (MISCELLANEOUS) ×1 IMPLANT
DEVICE TORQUE (MISCELLANEOUS) ×1 IMPLANT
DRAPE BRACHIAL (DRAPES) ×2 IMPLANT
GLIDEWIRE STIFF .35X180X3 HYDR (WIRE) ×1 IMPLANT
GUIDEWIRE VERSACORE 175CM (WIRE) ×1 IMPLANT
GUIDEWIRE VERSACORE 260 (WIRE) ×1 IMPLANT
KIT ENCORE 26 ADVANTAGE (KITS) ×1 IMPLANT
PACK ANGIOGRAPHY (CUSTOM PROCEDURE TRAY) ×2 IMPLANT
SHEATH BRITE TIP 6FRX5.5 (SHEATH) ×1 IMPLANT
SHEATH BRITE TIP 7FRX5.5 (SHEATH) ×2 IMPLANT
SUT MNCRL AB 4-0 PS2 18 (SUTURE) ×1 IMPLANT

## 2022-06-12 NOTE — Progress Notes (Signed)
  Treatment Date: 06/12/2022   Treatment Time: 2 hours 30 minutes   Access: Left AVF   UF Removed: 1000 ml   Next Treatment: 06/13/2022   Note:   Patient s/p fistulagram, received okay to use AVF by Dr. Holley Raring as long as it's functional. No complication with AVF during treatment. HD treatment completed. Tolerated well, no adverse effects noted or reported. Patient hemodynamically stable. Report given to floor nurse K. Marshell Levan, RN.

## 2022-06-12 NOTE — Op Note (Signed)
OPERATIVE NOTE   PROCEDURE: Contrast injection left arm AV access. Mechanical thrombectomy left arm AV access Percutaneous transluminal angioplasty of the left arm AV access in 2 locations both peripheral  PRE-OPERATIVE DIAGNOSIS: Thrombosis of dialysis access                                                       End Stage Renal Disease  POST-OPERATIVE DIAGNOSIS: same as above   SURGEON: Katha Cabal, M.D.  ANESTHESIA: Conscious sedation was administered by the radiology RN under my direct supervision. IV Versed plus fentanyl were utilized. Continuous ECG, pulse oximetry and blood pressure was monitored throughout the entire procedure. Conscious sedation was for a total of 90 minutes.    ESTIMATED BLOOD LOSS: minimal  FINDING(S): Thrombus within the graft associated narrowing at the arterial anastomosis as well as at the venous outflow  SPECIMEN(S):  None  CONTRAST: 55 cc  FLUOROSCOPY TIME: 9.4 minutes  INDICATIONS: Lawrence [name] is a 66 y.o. male who  presents with malfunctioning left arm AV access.  The patient is scheduled for angiography with possible intervention of the AV access.  The patient is aware the risks include but are not limited to: bleeding, infection, thrombosis of the cannulated access, and possible anaphylactic reaction to the contrast.  The patient acknowledges if the access can not be salvaged a tunneled catheter will be needed and will be placed during this procedure.  The patient is aware of the risks of the procedure and elects to proceed with the angiogram and intervention.  DESCRIPTION: After full informed written consent was obtained, the patient was brought back to the Special Procedure suite and placed supine position.  Appropriate cardiopulmonary monitors were placed.  The left arm was prepped and draped in the standard fashion.  Appropriate timeout is called. The left AV access was cannulated with a micropuncture needle using ultrasound  guidance and an antegrade direction.  With the ultrasound the AV access appeared to be filled with heterogeneous material and was poorly compressible indicating thrombosis of the AV access. The puncture was made under direct ultrasound visualization and an image was recorded for the permanent record.  The microwire was advanced and the needle was exchanged for  a microsheath.  The J-wire was then advanced and a 7 Fr sheath inserted.  Hand was then performed which demonstrated thrombus within the AV access.  The central venous structures were also imaged by hand injections and these were noted to be widely patent.  4000 units of heparin was given and allowed to circulate as well.  10 mg of tPA was reconstituted and then laced throughout the entire length of the AV access.  A penumbra CAT 7 device was then advanced beginning at the sheath and advancing forward into the central venous system.  Several passes were made through the venous portion of the graft. Follow-up imaging now demonstrates the vast majority of the clot had been treated. Therefore a retrograde sheath was inserted. This was a 7 [place] sheath and was positioned more proximally on the arm and angled in the retrograde direction. The AV access was cannulated with a micropuncture needle using ultrasound guidance.  With the ultrasound the AV access appeared to be filled with heterogeneous material and was poorly compressible indicating thrombosis of the AV access. The puncture was made under  direct ultrasound visualization and an image was recorded for the permanent record.  Subsequently a floppy Glidewire and a KMP catheter were negotiated into the arterial system and hand injection contrast was then utilized to demonstrate patency of the brachial artery as well as the location for the anastomosis. The penumbra CAT 7 device was now advanced through the retrograde sheath was extended out to the artery at the level of the anastomosis and it was slowly  pulled back into the graft. Several passes were made on the arterial portion and pulsatility of the access was reestablished. Follow-up imaging demonstrates there was essentially resolution of the thrombus within the AV graft.  Hand-injection now demonstrated there was a greater than 70% stenosis at the anastomosis with the brachial artery and there was a diffuse 80% stenosis at the venous outflow.  And 035 wire was then advanced through the retrograde sheath initially a 6 mm x 40 mm Lutonix drug-eluting balloon was used to treat this lesion and then based on follow-up imaging and greater than 30% residual stenosis a 7 mm x 40 mm Lutonix drug-eluting balloon was used to treat this.  Multiple oblique views were obtained to try to get the best angle but ultimately it appeared that the anastomosis as well as the brachial artery were widely patent imaging was obtained by advancing a Kumpe catheter over the wire and positioning the tip of the catheter in the proximal brachial artery each time a image was obtained.  Satisfied with the less than 10% residual stenosis at the arterial anastomosis hand-injection of contrast through the antegrade sheath was performed.  Antegrade sheath and an 8 x 6 Lutonix balloon was used to angioplasty the venous outflow portion of the AV access.  1 inflation was performed with inflation times of 1 minute with maximum pressures of 14 ATM.  Follow-up imaging now demonstrated wide patency of the AV access with preservation of the central veins.  I therefore elected to terminate the procedure.  A 4-0 Monocryl purse-string suture was sewn around both of the sheaths.  The sheaths were removed and light pressure was applied.  A sterile bandage was applied to the puncture site.    COMPLICATIONS: None  CONDITION: Improved  Katha Cabal, M.D [facility] Office: [phone]  06/12/2022 11:00 PM

## 2022-06-12 NOTE — Progress Notes (Signed)
[facility]  ROUNDING NOTE   Subjective:   Patient seen and evaluated at bedside Currently NPO for vascular procedure Denies pain and discomfort No lower extremity edema   Objective:  Vital signs in last 24 hours:  Temp:  [97.9 F (36.6 C)-98.9 F (37.2 C)] 98.6 F (37 C) (07/06 0801) Pulse Rate:  [78-93] 85 (07/06 0801) Resp:  [10-24] 20 (07/06 0801) BP: (125-148)/(85-112) 130/89 (07/06 0801) SpO2:  [95 %-100 %] 97 % (07/06 0801) Weight:  [79.9 kg-82.2 kg] 79.9 kg (07/05 1808)  Weight change:  Filed Weights   06/11/22 1445 06/11/22 1808  Weight: 82.2 kg 79.9 kg    Intake/Output: I/O last 3 completed shifts: In: 240.7 [P.O.:240; I.V.:0.7] Out: 1000 [Other:1000]   Intake/Output this shift:  Total I/O In: 30 [P.O.:30] Out: -   Physical Exam: General: No acute distress  Head: Normocephalic, atraumatic. Moist oral mucosal membranes  Lungs:  Clear to auscultation, normal effort  Heart: S1S2 no rubs  Abdomen:  Soft, nontender, bowel sounds present  Extremities: Trace peripheral edema.  Neurologic: Awake, alert, following commands  Skin: No acute rash  Access: Clotted left upper extremity AV access    Basic Metabolic Panel: Recent Labs  Lab 06/09/22 2343 06/10/22 0031 06/10/22 1705 06/11/22 0358 06/11/22 1208 06/11/22 1454 06/12/22 0856  NA 143  --  139 139  --   --  134*  K 6.2*   < > 5.8* 6.6* 5.8* 5.5* 5.3*  CL 101  --  97* 99  --   --  97*  CO2 29  --  27 25  --   --  25  GLUCOSE 91  --  157* 99  --   --  103*  BUN 89*  --  98* 102*  --   --  51*  CREATININE 14.62*  --  16.02* 16.25*  --   --  11.07*  CALCIUM 9.8  --  9.1 9.3  --   --  8.8*  PHOS  --   --   --   --   --   --  5.1*   < > = values in this interval not displayed.     Liver Function Tests: Recent Labs  Lab 06/09/22 2343 06/12/22 0856  AST 16  --   ALT 11  --   ALKPHOS 150*  --   BILITOT 1.0  --   PROT 7.6  --   ALBUMIN 3.8 3.0*    No results for input(s):  "LIPASE", "AMYLASE" in the last 168 hours. No results for input(s): "AMMONIA" in the last 168 hours.  CBC: Recent Labs  Lab 06/09/22 2343 06/10/22 0521 06/11/22 0358 06/12/22 0856  WBC 5.5 5.1 5.3 4.1  NEUTROABS 3.1  --   --   --   HGB 12.5* 11.6* 11.1* 11.3*  HCT 40.4 36.9* 34.2* 34.9*  MCV 102.3* 101.4* 98.8 96.9  PLT 69* 76* 72* 70*     Cardiac Enzymes: No results for input(s): "CKTOTAL", "CKMB", "CKMBINDEX", "TROPONINI" in the last 168 hours.  BNP: Invalid input(s): "POCBNP"  CBG: Recent Labs  Lab 06/10/22 2128 06/11/22 0814 06/11/22 1138 06/11/22 2226 06/12/22 0802  GLUCAP 134* 124* 113* 169* 110*     Microbiology: Results for orders placed or performed during the hospital encounter of 11/29/21  Resp Panel by RT-PCR (Flu A&B, Covid) Nasopharyngeal Swab     Status: None   Collection Time: 11/29/21  6:45 PM   Specimen: Nasopharyngeal Swab; Nasopharyngeal(NP) swabs in vial  transport medium  Result Value Ref Range Status   SARS Coronavirus 2 by RT PCR NEGATIVE NEGATIVE Final    Comment: (NOTE) SARS-CoV-2 target nucleic acids are NOT DETECTED.  The SARS-CoV-2 RNA is generally detectable in upper respiratory specimens during the acute phase of infection. The lowest concentration of SARS-CoV-2 viral copies this assay can detect is 138 copies/mL. A negative result does not preclude SARS-Cov-2 infection and should not be used as the sole basis for treatment or other patient management decisions. A negative result may occur with  improper specimen collection/handling, submission of specimen other than nasopharyngeal swab, presence of viral mutation(s) within the areas targeted by this assay, and inadequate number of viral copies(<138 copies/mL). A negative result must be combined with clinical observations, patient history, and epidemiological information. The expected result is Negative.  Fact Sheet for Patients:   EntrepreneurPulse.com.au  Fact Sheet for Healthcare Providers:  IncredibleEmployment.be  This test is no t yet approved or cleared by the [place] FDA and  has been authorized for detection and/or diagnosis of SARS-CoV-2 by FDA under an Emergency Use Authorization (EUA). This EUA will remain  in effect (meaning this test can be used) for the duration of the COVID-19 declaration under Section 564(b)(1) of the Act, 21 U.S.C.section 360bbb-3(b)(1), unless the authorization is terminated  or revoked sooner.       Influenza A by PCR NEGATIVE NEGATIVE Final   Influenza B by PCR NEGATIVE NEGATIVE Final    Comment: (NOTE) The Xpert Xpress SARS-CoV-2/FLU/RSV plus assay is intended as an aid in the diagnosis of influenza from Nasopharyngeal swab specimens and should not be used as a sole basis for treatment. Nasal washings and aspirates are unacceptable for Xpert Xpress SARS-CoV-2/FLU/RSV testing.  Fact Sheet for Patients: EntrepreneurPulse.com.au  Fact Sheet for Healthcare Providers: IncredibleEmployment.be  This test is not yet approved or cleared by the [place] FDA and has been authorized for detection and/or diagnosis of SARS-CoV-2 by FDA under an Emergency Use Authorization (EUA). This EUA will remain in effect (meaning this test can be used) for the duration of the COVID-19 declaration under Section 564(b)(1) of the Act, 21 U.S.C. section 360bbb-3(b)(1), unless the authorization is terminated or revoked.  Performed at [facility], [address] [place], [place] [address]     Coagulation Studies: Recent Labs    06/10/22 0004  LABPROT 14.4  INR 1.1     Urinalysis: No results for input(s): "COLORURINE", "LABSPEC", "PHURINE", "GLUCOSEU", "HGBUR", "BILIRUBINUR", "KETONESUR", "PROTEINUR", "UROBILINOGEN", "NITRITE", "LEUKOCYTESUR" in the last 72 hours.  Invalid input(s):  "APPERANCEUR"    Imaging: PERIPHERAL VASCULAR CATHETERIZATION  Result Date: 06/11/2022 See surgical note for result.    Medications:    sodium chloride 10 mL/hr at 06/12/22 0657    ceFAZolin (ANCEF) IV      aspirin EC  81 mg Oral Daily   benztropine  0.5 mg Oral BID   carvedilol  25 mg Oral BID WC   Chlorhexidine Gluconate Cloth  6 each Topical Q0600   cholecalciferol  5,000 Units Oral Weekly   docusate sodium  100 mg Oral BID   feeding supplement  1 Container Oral See admin instructions   fluticasone  2 spray Each Nare Daily   haloperidol  10 mg Oral QHS   haloperidol  5 mg Oral q morning   heparin  5,000 Units Subcutaneous Q8H   insulin aspart  0-9 Units Subcutaneous TID AC & HS   loratadine  10 mg Oral Daily   midodrine  5 mg Oral Once per day on Mon Wed Fri   mirtazapine  15 mg Oral QHS   niacin  1,000 mg Oral QHS   patiromer  16.8 g Oral Daily   pravastatin  40 mg Oral QHS   sevelamer carbonate  1,600 mg Oral TID WC   acetaminophen **OR** acetaminophen, diphenhydrAMINE, diphenhydrAMINE, famotidine, HYDROmorphone (DILAUDID) injection, magnesium hydroxide, methylPREDNISolone (SOLU-MEDROL) injection, midazolam, ondansetron **OR** ondansetron (ZOFRAN) IV, polyethylene glycol, traZODone  Assessment/ Plan:  66 y.o. male with past medical history of diabetes mellitus type 2, hypertension, hyperlipidemia, schizophrenia, history of CVA, ESRD who presents with clotted access.  [facility]/[facility]WF-2/LUE AV access  1.  ESRD on HD MWF/hyperkalemia/clotted dialysis access.  Patient's access is now clotted.   Vascular unable to perform procedure yesterday due to elevated potassium. Temp cath placed and patient received treatment yesterday on 2K bath. Potassium remains 5.3 today. Patient ordered veltassa daily. Next treatment scheduled for Friday.   2.  Anemia of chronic kidney disease.   Hgb 11.3, at goal  3.  Secondary hyperparathyroidism.  Calcium and phosphorus within  acceptable range. Continue on Renvela 1600 mg p.o. 3 times daily.  4.  Hypertension.  Maintain the patient on carvedilol. Blood pressure 130/89   LOS: 1   7/6/202311:26 AM

## 2022-06-12 NOTE — Progress Notes (Signed)
PROGRESS NOTE    Lawrence [name]  JAS:[id number] DOB: [birth year] DOA: 06/09/2022 PCP: Sharyne Peach, MD    Brief Narrative:  66 year old gentleman with history of type 2 diabetes, hypertension, dyslipidemia, schizophrenia, ESRD came to the emergency room after missed hemodialysis due to losing access from AV fistula clotting.  He was sent to the ER from dialysis center.  Patient was without any complaints.  At the emergency room vitals are stable.  Potassium 6.2 and repeat potassium 5.5.  AV fistula ultrasound showed near complete thrombosis of the proximal cephalic AV fistula.  Patient was given 1 L of Ringer lactate and 1 g of calcium gluconate.   Assessment & Plan:   ESRD on hemodialysis Monday Wednesday Friday, missed hemodialysis AV fistula malfunction and occlusion Hyperkalemia  Currently hemodynamically stable. Potassium corrected after hemodialysis through temporary hemodialysis catheter. Temporary dialysis catheter 7/5, hemodialysis Vascular surgery following, hopefully can undergo declotting of AV fistula and resume dialysis through permanent access.  Essential hypertension: Blood pressure fairly stable on current medications.  Type 2 diabetes , controlled: On Glucotrol at home.  Currently remains on sliding scale insulin.  Schizoaffective disorder: On Haldol and Cogentin.  Continue.  Lives in a group home.  Can go back when is stable.   DVT prophylaxis: heparin injection 5,000 Units Start: 06/10/22 0145   Code Status: Full code Family Communication: None Disposition Plan: Status is: Inpatient Remains inpatient appropriate because: Inpatient procedures needed.  Hyperkalemia and inpatient dialysis needed.    Consultants:  Vascular surgery Nephrology  Procedures:  Going for procedure  Antimicrobials:  None   Subjective:  Seen and examined in the morning rounds.  Denies any complaints.  Objective: Vitals:   06/11/22 1808 06/11/22 1929 06/12/22 0348  06/12/22 0801  BP: (!) 128/112 (!) 135/93 (!) 137/96 130/89  Pulse: 82 82 90 85  Resp: '20 20 20 20  '$ Temp: 98.5 F (36.9 C) 97.9 F (36.6 C) 98.9 F (37.2 C) 98.6 F (37 C)  TempSrc: Oral Oral Oral Oral  SpO2: 97% 98% 99% 97%  Weight: 79.9 kg     Height:        Intake/Output Summary (Last 24 hours) at 06/12/2022 1039 Last data filed at 06/12/2022 0657 Gross per 24 hour  Intake 240.67 ml  Output 1000 ml  Net -759.33 ml    Filed Weights   06/11/22 1445 06/11/22 1808  Weight: 82.2 kg 79.9 kg    Examination:  General exam: Appears calm and comfortable  Pleasant comfortable on room air.  Flat affect.  Alert and oriented. Respiratory system: No added sounds. Cardiovascular system: S1 & S2 heard, RRR.  Gastrointestinal system: Soft.  Nontender.  Bowel sound present.   Left upper arm AV fistula, no thrill. Femoral line present.  Clean and dry.   Data Reviewed: I have personally reviewed following labs and imaging studies  CBC: Recent Labs  Lab 06/09/22 2343 06/10/22 0521 06/11/22 0358 06/12/22 0856  WBC 5.5 5.1 5.3 4.1  NEUTROABS 3.1  --   --   --   HGB 12.5* 11.6* 11.1* 11.3*  HCT 40.4 36.9* 34.2* 34.9*  MCV 102.3* 101.4* 98.8 96.9  PLT 69* 76* 72* 70*    Basic Metabolic Panel: Recent Labs  Lab 06/09/22 2343 06/10/22 0031 06/10/22 1705 06/11/22 0358 06/11/22 1208 06/11/22 1454 06/12/22 0856  NA 143  --  139 139  --   --  134*  K 6.2*   < > 5.8* 6.6* 5.8* 5.5* 5.3*  CL 101  --  97* 99  --   --  97*  CO2 29  --  27 25  --   --  25  GLUCOSE 91  --  157* 99  --   --  103*  BUN 89*  --  98* 102*  --   --  51*  CREATININE 14.62*  --  16.02* 16.25*  --   --  11.07*  CALCIUM 9.8  --  9.1 9.3  --   --  8.8*  PHOS  --   --   --   --   --   --  5.1*   < > = values in this interval not displayed.    GFR: Estimated Creatinine Clearance: 6.6 mL/min (A) (by C-G formula based on SCr of 11.07 mg/dL (H)). Liver Function Tests: Recent Labs  Lab 06/09/22 2343  06/12/22 0856  AST 16  --   ALT 11  --   ALKPHOS 150*  --   BILITOT 1.0  --   PROT 7.6  --   ALBUMIN 3.8 3.0*    No results for input(s): "LIPASE", "AMYLASE" in the last 168 hours. No results for input(s): "AMMONIA" in the last 168 hours. Coagulation Profile: Recent Labs  Lab 06/10/22 0004  INR 1.1    Cardiac Enzymes: No results for input(s): "CKTOTAL", "CKMB", "CKMBINDEX", "TROPONINI" in the last 168 hours. BNP (last 3 results) No results for input(s): "PROBNP" in the last 8760 hours. HbA1C: Recent Labs    06/10/22 0521  HGBA1C 4.9    CBG: Recent Labs  Lab 06/10/22 2128 06/11/22 0814 06/11/22 1138 06/11/22 2226 06/12/22 0802  GLUCAP 134* 124* 113* 169* 110*    Lipid Profile: No results for input(s): "CHOL", "HDL", "LDLCALC", "TRIG", "CHOLHDL", "LDLDIRECT" in the last 72 hours. Thyroid Function Tests: No results for input(s): "TSH", "T4TOTAL", "FREET4", "T3FREE", "THYROIDAB" in the last 72 hours. Anemia Panel: No results for input(s): "VITAMINB12", "FOLATE", "FERRITIN", "TIBC", "IRON", "RETICCTPCT" in the last 72 hours. Sepsis Labs: No results for input(s): "PROCALCITON", "LATICACIDVEN" in the last 168 hours.  No results found for this or any previous visit (from the past 240 hour(s)).       Radiology Studies: PERIPHERAL VASCULAR CATHETERIZATION  Result Date: 06/11/2022 See surgical note for result.       Scheduled Meds:  aspirin EC  81 mg Oral Daily   benztropine  0.5 mg Oral BID   carvedilol  25 mg Oral BID WC   Chlorhexidine Gluconate Cloth  6 each Topical Q0600   cholecalciferol  5,000 Units Oral Weekly   docusate sodium  100 mg Oral BID   feeding supplement  1 Container Oral See admin instructions   fluticasone  2 spray Each Nare Daily   haloperidol  10 mg Oral QHS   haloperidol  5 mg Oral q morning   heparin  5,000 Units Subcutaneous Q8H   insulin aspart  0-9 Units Subcutaneous TID AC & HS   loratadine  10 mg Oral Daily   midodrine   5 mg Oral Once per day on Mon Wed Fri   mirtazapine  15 mg Oral QHS   niacin  1,000 mg Oral QHS   patiromer  16.8 g Oral Daily   pravastatin  40 mg Oral QHS   sevelamer carbonate  1,600 mg Oral TID WC   Continuous Infusions:  sodium chloride 10 mL/hr at 06/12/22 0657    ceFAZolin (ANCEF) IV       LOS: 1 day    Time  spent: 35 minutes    Barb Merino, MD [facility] Pager [phone]

## 2022-06-13 ENCOUNTER — Encounter: Payer: Self-pay | Admitting: Vascular Surgery

## 2022-06-13 DIAGNOSIS — E119 Type 2 diabetes mellitus without complications: Secondary | ICD-10-CM | POA: Diagnosis not present

## 2022-06-13 DIAGNOSIS — T82898A Other specified complication of vascular prosthetic devices, implants and grafts, initial encounter: Secondary | ICD-10-CM | POA: Diagnosis not present

## 2022-06-13 DIAGNOSIS — T82590A Other mechanical complication of surgically created arteriovenous fistula, initial encounter: Secondary | ICD-10-CM | POA: Diagnosis not present

## 2022-06-13 LAB — RENAL FUNCTION PANEL
Albumin: 3 g/dL — ABNORMAL LOW (ref 3.5–5.0)
Anion gap: 9 (ref 5–15)
BUN: 40 mg/dL — ABNORMAL HIGH (ref 8–23)
CO2: 28 mmol/L (ref 22–32)
Calcium: 8.8 mg/dL — ABNORMAL LOW (ref 8.9–10.3)
Chloride: 96 mmol/L — ABNORMAL LOW (ref 98–111)
Creatinine, Ser: 9.24 mg/dL — ABNORMAL HIGH (ref 0.61–1.24)
GFR, Estimated: 6 mL/min — ABNORMAL LOW (ref >60)
Glucose, Bld: 100 mg/dL — ABNORMAL HIGH (ref 70–99)
Phosphorus: 5.5 mg/dL — ABNORMAL HIGH (ref 2.5–4.6)
Potassium: 4.5 mmol/L (ref 3.5–5.1)
Sodium: 133 mmol/L — ABNORMAL LOW (ref 135–145)

## 2022-06-13 LAB — CBC
HCT: 33.4 % — ABNORMAL LOW (ref 39.0–52.0)
Hemoglobin: 10.8 g/dL — ABNORMAL LOW (ref 13.0–17.0)
MCH: 32 pg (ref 26.0–34.0)
MCHC: 32.3 g/dL (ref 30.0–36.0)
MCV: 98.8 fL (ref 80.0–100.0)
Platelets: 64 10*3/uL — ABNORMAL LOW (ref 150–400)
RBC: 3.38 MIL/uL — ABNORMAL LOW (ref 4.22–5.81)
RDW: 15 % (ref 11.5–15.5)
WBC: 4 10*3/uL (ref 4.0–10.5)
nRBC: 0 % (ref 0.0–0.2)

## 2022-06-13 LAB — GLUCOSE, CAPILLARY: Glucose-Capillary: 103 mg/dL — ABNORMAL HIGH (ref 70–99)

## 2022-06-13 MED ORDER — HEPARIN SODIUM (PORCINE) 1000 UNIT/ML DIALYSIS: 1000.0000 [IU] | INTRAMUSCULAR | Status: DC | PRN

## 2022-06-13 MED ORDER — LIDOCAINE-PRILOCAINE 2.5-2.5 % EX CREA: 1.0000 | TOPICAL_CREAM | CUTANEOUS | Status: DC | PRN

## 2022-06-13 MED ORDER — ALTEPLASE 2 MG IJ SOLR: 2.0000 mg | Freq: Once | INTRAMUSCULAR | Status: DC | PRN

## 2022-06-13 MED ORDER — PENTAFLUOROPROP-TETRAFLUOROETH EX AERO
1.0000 | INHALATION_SPRAY | CUTANEOUS | Status: DC | PRN
  Filled 2022-06-13: qty 30

## 2022-06-13 MED ORDER — LIDOCAINE HCL (PF) 1 % IJ SOLN: 5.0000 mL | INTRAMUSCULAR | Status: DC | PRN

## 2022-06-13 NOTE — Progress Notes (Signed)
Hemodialysis Post Treatment Note:  Tx date:06/13/2022 Tx time: 3 hours Access: Left AVF UF Removed: 1 L  Note:   09:44 HD started but machine keeps on alarming air detector, lines inspected,air noticed on venous chamber, air removed but machine still alarming air detector then venous chamber high alarm and TMP alarm, blood reached the transducer. HD terminated right away and new set up done   09:56 HD started  13:01 HD completed, tolerated well. No HD complications noted. Patient asymptomatic

## 2022-06-13 NOTE — Progress Notes (Signed)
[facility]  ROUNDING NOTE   Subjective:   Patient seen and evaluated during dialysis   HEMODIALYSIS FLOWSHEET:  Blood Flow Rate (mL/min): 400 mL/min Arterial Pressure (mmHg): -160 mmHg Venous Pressure (mmHg): 230 mmHg Transmembrane Pressure (mmHg): 60 mmHg Ultrafiltration Rate (mL/min): 500 mL/min Dialysate Flow Rate (mL/min): 500 ml/min Conductivity: 13.9 Conductivity: 13.9 Dialysis Fluid Bolus: Normal Saline Bolus Amount (mL): 250 mL  No complaints at this time.    Objective:  Vital signs in last 24 hours:  Temp:  [97.5 F (36.4 C)-98.6 F (37 C)] 98.5 F (36.9 C) (07/07 0847) Pulse Rate:  [0-96] 86 (07/07 1115) Resp:  [0-23] 16 (07/07 1230) BP: (113-152)/(71-102) 115/73 (07/07 1230) SpO2:  [92 %-100 %] 100 % (07/07 1230) Weight:  [77.2 kg-85 kg] 77.2 kg (07/07 0847)  Weight change: 2.8 kg Filed Weights   06/12/22 1550 06/12/22 1929 06/13/22 0847  Weight: 85 kg 84.6 kg 77.2 kg    Intake/Output: I/O last 3 completed shifts: In: 321.6 [P.O.:270; I.V.:51.6] Out: 1000 [Other:1000]   Intake/Output this shift:  No intake/output data recorded.  Physical Exam: General: No acute distress  Head: Normocephalic, atraumatic. Moist oral mucosal membranes  Lungs:  Clear to auscultation, normal effort  Heart: S1S2 no rubs  Abdomen:  Soft, nontender, bowel sounds present  Extremities: Trace peripheral edema.  Neurologic: Awake, alert, following commands  Skin: No acute rash  Access: Left upper extremity AV access    Basic Metabolic Panel: Recent Labs  Lab 06/09/22 2343 06/10/22 0031 06/10/22 1705 06/11/22 0358 06/11/22 1208 06/11/22 1454 06/12/22 0856 06/13/22 0930  NA 143  --  139 139  --   --  134* 133*  K 6.2*   < > 5.8* 6.6* 5.8* 5.5* 5.3* 4.5  CL 101  --  97* 99  --   --  97* 96*  CO2 29  --  27 25  --   --  25 28  GLUCOSE 91  --  157* 99  --   --  103* 100*  BUN 89*  --  98* 102*  --   --  51* 40*  CREATININE 14.62*  --  16.02*  16.25*  --   --  11.07* 9.24*  CALCIUM 9.8  --  9.1 9.3  --   --  8.8* 8.8*  PHOS  --   --   --   --   --   --  5.1* 5.5*   < > = values in this interval not displayed.     Liver Function Tests: Recent Labs  Lab 06/09/22 2343 06/12/22 0856 06/13/22 0930  AST 16  --   --   ALT 11  --   --   ALKPHOS 150*  --   --   BILITOT 1.0  --   --   PROT 7.6  --   --   ALBUMIN 3.8 3.0* 3.0*    No results for input(s): "LIPASE", "AMYLASE" in the last 168 hours. No results for input(s): "AMMONIA" in the last 168 hours.  CBC: Recent Labs  Lab 06/09/22 2343 06/10/22 0521 06/11/22 0358 06/12/22 0856 06/13/22 0930  WBC 5.5 5.1 5.3 4.1 4.0  NEUTROABS 3.1  --   --   --   --   HGB 12.5* 11.6* 11.1* 11.3* 10.8*  HCT 40.4 36.9* 34.2* 34.9* 33.4*  MCV 102.3* 101.4* 98.8 96.9 98.8  PLT 69* 76* 72* 70* 64*     Cardiac Enzymes: No results for input(s): "CKTOTAL", "CKMB", "CKMBINDEX", "  TROPONINI" in the last 168 hours.  BNP: Invalid input(s): "POCBNP"  CBG: Recent Labs  Lab 06/12/22 0802 06/12/22 1140 06/12/22 1214 06/12/22 2108 06/13/22 0800  GLUCAP 110* 105* 102* 131* 103*     Microbiology: Results for orders placed or performed during the hospital encounter of 11/29/21  Resp Panel by RT-PCR (Flu A&B, Covid) Nasopharyngeal Swab     Status: None   Collection Time: 11/29/21  6:45 PM   Specimen: Nasopharyngeal Swab; Nasopharyngeal(NP) swabs in vial transport medium  Result Value Ref Range Status   SARS Coronavirus 2 by RT PCR NEGATIVE NEGATIVE Final    Comment: (NOTE) SARS-CoV-2 target nucleic acids are NOT DETECTED.  The SARS-CoV-2 RNA is generally detectable in upper respiratory specimens during the acute phase of infection. The lowest concentration of SARS-CoV-2 viral copies this assay can detect is 138 copies/mL. A negative result does not preclude SARS-Cov-2 infection and should not be used as the sole basis for treatment or other patient management decisions. A  negative result may occur with  improper specimen collection/handling, submission of specimen other than nasopharyngeal swab, presence of viral mutation(s) within the areas targeted by this assay, and inadequate number of viral copies(<138 copies/mL). A negative result must be combined with clinical observations, patient history, and epidemiological information. The expected result is Negative.  Fact Sheet for Patients:  EntrepreneurPulse.com.au  Fact Sheet for Healthcare Providers:  IncredibleEmployment.be  This test is no t yet approved or cleared by the [place] FDA and  has been authorized for detection and/or diagnosis of SARS-CoV-2 by FDA under an Emergency Use Authorization (EUA). This EUA will remain  in effect (meaning this test can be used) for the duration of the COVID-19 declaration under Section 564(b)(1) of the Act, 21 U.S.C.section 360bbb-3(b)(1), unless the authorization is terminated  or revoked sooner.       Influenza A by PCR NEGATIVE NEGATIVE Final   Influenza B by PCR NEGATIVE NEGATIVE Final    Comment: (NOTE) The Xpert Xpress SARS-CoV-2/FLU/RSV plus assay is intended as an aid in the diagnosis of influenza from Nasopharyngeal swab specimens and should not be used as a sole basis for treatment. Nasal washings and aspirates are unacceptable for Xpert Xpress SARS-CoV-2/FLU/RSV testing.  Fact Sheet for Patients: EntrepreneurPulse.com.au  Fact Sheet for Healthcare Providers: IncredibleEmployment.be  This test is not yet approved or cleared by the [place] FDA and has been authorized for detection and/or diagnosis of SARS-CoV-2 by FDA under an Emergency Use Authorization (EUA). This EUA will remain in effect (meaning this test can be used) for the duration of the COVID-19 declaration under Section 564(b)(1) of the Act, 21 U.S.C. section 360bbb-3(b)(1), unless the authorization  is terminated or revoked.  Performed at [facility], [address] [place], [place] [address]     Coagulation Studies: No results for input(s): "LABPROT", "INR" in the last 72 hours.   Urinalysis: No results for input(s): "COLORURINE", "LABSPEC", "PHURINE", "GLUCOSEU", "HGBUR", "BILIRUBINUR", "KETONESUR", "PROTEINUR", "UROBILINOGEN", "NITRITE", "LEUKOCYTESUR" in the last 72 hours.  Invalid input(s): "APPERANCEUR"    Imaging: PERIPHERAL VASCULAR CATHETERIZATION  Result Date: 06/12/2022 See surgical note for result.  PERIPHERAL VASCULAR CATHETERIZATION  Result Date: 06/11/2022 See surgical note for result.    Medications:      aspirin EC  81 mg Oral Daily   benztropine  0.5 mg Oral BID   carvedilol  25 mg Oral BID WC   Chlorhexidine Gluconate Cloth  6 each Topical Q0600   cholecalciferol  5,000 Units Oral Weekly  docusate sodium  100 mg Oral BID   feeding supplement  1 Container Oral See admin instructions   fluticasone  2 spray Each Nare Daily   haloperidol  10 mg Oral QHS   haloperidol  5 mg Oral q morning   heparin  5,000 Units Subcutaneous Q8H   insulin aspart  0-9 Units Subcutaneous TID AC & HS   loratadine  10 mg Oral Daily   midodrine  5 mg Oral Once per day on Mon Wed Fri   mirtazapine  15 mg Oral QHS   niacin  1,000 mg Oral QHS   patiromer  16.8 g Oral Daily   pravastatin  40 mg Oral QHS   sevelamer carbonate  1,600 mg Oral TID WC   acetaminophen **OR** acetaminophen, alteplase, diphenhydrAMINE, heparin, HYDROmorphone (DILAUDID) injection, lidocaine (PF), lidocaine-prilocaine, magnesium hydroxide, ondansetron **OR** ondansetron (ZOFRAN) IV, pentafluoroprop-tetrafluoroeth, polyethylene glycol, traZODone  Assessment/ Plan:  66 y.o. male with past medical history of diabetes mellitus type 2, hypertension, hyperlipidemia, schizophrenia, history of CVA, ESRD who presents with clotted access.  [facility]/[facility]WF-2/LUE AV access  1.  ESRD on HD  MWF/hyperkalemia/clotted dialysis access.  Patient's access is now clotted.   Appreciate vascular performing angiogram and access currently being used without concern for treatment today. Patient cleared for discharge after treatment today. Order placed to remove temp cath after dialysis. Next treatment will be scheduled for Monday.  2.  Anemia of chronic kidney disease.   Hgb stable  3.  Secondary hyperparathyroidism.  Continue on Renvela 1600 mg p.o. 3 times daily.  4.  Hypertension.  Maintain the patient on carvedilol. Blood pressure 112873 during dialysis   LOS: 2   7/7/202312:58 PM

## 2022-06-13 NOTE — NC FL2 (Signed)
[place] LEVEL OF CARE SCREENING TOOL     IDENTIFICATION  Patient Name: Lawrence [name] Birthdate: 27-Aug-1956 Sex: male Admission Date (Current Location): 06/09/2022  [place] and [place] Number:  Engineering geologist and Address:  [facility], [address] [place], [place] [address]      Provider Number: [id number]  Attending Physician Name and Address:  Barb Merino, MD  Relative Name and Phone Number:  Dorna Bloom ([phone] (Home Phone)    Current Level of Care: Hospital Recommended Level of Care: [facility] Prior Approval Number:    Date Approved/Denied:   PASRR Number:    Discharge Plan: Other (Comment) (Family Care (Group) Home)    Current Diagnoses: Patient Active Problem List   Diagnosis Date Noted   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[place]) 06/10/2022   Clotted dialysis access ([place]) 06/09/2022   Arteriovenous fistula occlusion ([place])    Dialysis AV fistula malfunction ([place]) 11/29/2021   Fluid overload, unspecified 05/30/2020   Schizophrenia ([place]) 03/09/2020   Anuria 10/02/2019   Xerosis cutis 10/02/2019   Secondary hyperparathyroidism of renal origin ([place]) 06/28/2019   Leukopenia 04/17/2019   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   Elevated ferritin 04/17/2019   Hypokalemia 10/14/2017   ESRD on hemodialysis ([place]) 10/14/2017   Thrombocytopenia ([place]) 10/14/2017   Acute urinary tract infection 10/13/2017   History of CVA (cerebrovascular accident) 10/13/2017   Chronic schizoaffective disorder ([place]) 04/09/2016   Schizoaffective disorder ([place]) 98/26/4158   Complication of diabetes mellitus ([place]) 03/09/2016   Stage 5 chronic kidney disease ([place]) 03/09/2016   Thyroid nodule 03/09/2016   Aftercare including intermittent dialysis ([place]) 09/25/2015   Encounter for immunization 07/03/2015   Moderate protein-calorie malnutrition ([place]) 30/94/0768   Complication of vascular dialysis catheter 04/30/2015   Chest pain, unspecified  04/26/2015   Coagulation defect, unspecified ([place]) 04/26/2015   Disorder of phosphorus metabolism, unspecified 04/26/2015   Hypoglycemia, unspecified 04/26/2015   Iron deficiency anemia, unspecified 04/26/2015   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[place]) 04/26/2015   Pain, unspecified 04/26/2015   Pruritus, unspecified 04/26/2015   Shortness of breath 04/26/2015   Type 2 diabetes mellitus without complications ([place]) 08/81/1031   Dyslipidemia 04/26/2015   Essential hypertension 04/26/2015   Schizophrenia, unspecified ([place]) 04/26/2015   Thrombocytopenia, unspecified ([place]) 04/26/2015   ESRD on dialysis ([place]) 04/16/2015   Goiter 02/27/2015   Dry mouth 02/27/2015   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   Controlled type 2 diabetes mellitus without complication, without long-term current use of insulin ([place]) 01/21/2015   Benign hypertension with CKD (chronic kidney disease) stage IV ([place]) 01/21/2015   Encounter for screening for malignant neoplasm of colon 08/02/2014   Vitamin D deficiency 05/12/2013   Cerebrovascular accident (CVA) ([place]) 04/19/2013   Type 2 diabetes mellitus with hyperlipidemia ([place]) 04/19/2013    Orientation RESPIRATION BLADDER Height & Weight     Self, Time, Situation, Place  Normal  (Patient is receiving dialysis) Weight: 76.2 kg Height:  '5\' 7"'$  (170.2 cm)  BEHAVIORAL SYMPTOMS/MOOD NEUROLOGICAL BOWEL NUTRITION STATUS        Diet (Regular)  AMBULATORY STATUS COMMUNICATION OF NEEDS Skin     Verbally Normal                       Personal Care Assistance Level of Assistance  Bathing, Feeding, Dressing           Functional Limitations Info  Sight, Hearing, Speech Sight Info: Adequate Hearing Info: Adequate Speech Info: Adequate  SPECIAL CARE FACTORS FREQUENCY                       Contractures Contractures Info: Not present    Additional Factors Info  Code Status, Allergies Code Status Info: FULL CODE Allergies  Info: NSAIDs due to renal disease           Current Medications (06/13/2022):  This is the current hospital active medication list Current Facility-Administered Medications  Medication Dose Route Frequency Provider Last Rate Last Admin   acetaminophen (TYLENOL) tablet 650 mg  650 mg Oral Q6H PRN Mansy, Jan A, MD   650 mg at 06/12/22 0548   Or   acetaminophen (TYLENOL) suppository 650 mg  650 mg Rectal Q6H PRN Mansy, Jan A, MD       aspirin EC tablet 81 mg  81 mg Oral Daily Mansy, Jan A, MD   81 mg at 06/12/22 0841   benztropine (COGENTIN) tablet 0.5 mg  0.5 mg Oral BID Mansy, Jan A, MD   0.5 mg at 06/12/22 2144   carvedilol (COREG) tablet 25 mg  25 mg Oral BID WC Mansy, Jan A, MD   25 mg at 06/12/22 0841   Chlorhexidine Gluconate Cloth 2 % PADS 6 each  6 each Topical Q0600 Colon Flattery, NP   6 each at 06/13/22 3545   cholecalciferol (VITAMIN D3) tablet 5,000 Units  5,000 Units Oral Weekly Mansy, Jan A, MD   5,000 Units at 06/10/22 0547   diphenhydrAMINE (BENADRYL) capsule 25 mg  25 mg Oral QHS PRN Mansy, Jan A, MD       docusate sodium (COLACE) capsule 100 mg  100 mg Oral BID Mansy, Jan A, MD   100 mg at 06/12/22 2143   feeding supplement (BOOST HIGH PROTEIN) liquid 237 mL  1 Container Oral See admin instructions Mansy, Jan A, MD       fluticasone (FLONASE) 50 MCG/ACT nasal spray 2 spray  2 spray Each Nare Daily Mansy, Jan A, MD   2 spray at 06/12/22 0842   haloperidol (HALDOL) tablet 10 mg  10 mg Oral QHS Mansy, Jan A, MD   10 mg at 06/12/22 2144   haloperidol (HALDOL) tablet 5 mg  5 mg Oral q morning Mansy, Jan A, MD   5 mg at 06/12/22 0841   heparin injection 5,000 Units  5,000 Units Subcutaneous Q8H Mansy, Jan A, MD   5,000 Units at 06/13/22 0555   HYDROmorphone (DILAUDID) injection 1 mg  1 mg Intravenous Once PRN Kris Hartmann, NP       insulin aspart (novoLOG) injection 0-9 Units  0-9 Units Subcutaneous TID AC & HS Mansy, Jan A, MD   1 Units at 06/12/22 2144   loratadine  (CLARITIN) tablet 10 mg  10 mg Oral Daily Mansy, Jan A, MD   10 mg at 06/12/22 0841   magnesium hydroxide (MILK OF MAGNESIA) suspension 30 mL  30 mL Oral Daily PRN Mansy, Jan A, MD       midodrine (PROAMATINE) tablet 5 mg  5 mg Oral Once per day on Mon Wed Fri Mansy, Jan A, MD   5 mg at 06/13/22 0817   mirtazapine (REMERON) tablet 15 mg  15 mg Oral QHS Mansy, Jan A, MD   15 mg at 06/12/22 2144   niacin (NIASPAN) CR tablet 1,000 mg  1,000 mg Oral QHS Mansy, Jan A, MD   1,000 mg at 06/12/22 2144   ondansetron (ZOFRAN) tablet 4  mg  4 mg Oral Q6H PRN Mansy, Jan A, MD       Or   ondansetron [facility]) injection 4 mg  4 mg Intravenous Q6H PRN Mansy, Arvella Merles, MD       patiromer Daryll Drown) packet 16.8 g  16.8 g Oral Daily Lateef, Munsoor, MD   16.8 g at 06/12/22 0910   polyethylene glycol (MIRALAX / GLYCOLAX) packet 17 g  17 g Oral Daily PRN Mansy, Jan A, MD       pravastatin (PRAVACHOL) tablet 40 mg  40 mg Oral QHS Mansy, Jan A, MD   40 mg at 06/12/22 2144   sevelamer carbonate (RENVELA) tablet 1,600 mg  1,600 mg Oral TID WC Mansy, Jan A, MD   1,600 mg at 06/12/22 0841   traZODone (DESYREL) tablet 25 mg  25 mg Oral QHS PRN Mansy, Arvella Merles, MD         Discharge Medications: Please see discharge summary for a list of discharge medications.  Relevant Imaging Results:  Relevant Lab Results:   Additional Information [id number]  Pete Pelt, RN

## 2022-06-13 NOTE — Care Management Important Message (Signed)
Important Message  Patient Details  Name: Lawrence [name] MRN: [id number] Date of Birth: 03-Jul-1956   Medicare Important Message Given:  Yes  Patient out of room upon time of visit.  Copy of Medicare IM left in room for reference.   Dannette Barbara 06/13/2022, 10:32 AM

## 2022-06-13 NOTE — TOC Initial Note (Signed)
Transition of Care [facility]) - Initial/Assessment Note    Patient Details  Name: Lawrence [name] MRN: [id number] Date of Birth: 1956-08-11  Transition of [facility]) CM/SW Contact:    Pete Pelt, RN Phone Number: 06/13/2022, 1:49 PM  Clinical Narrative:      Patient is from [facility] home.  Patient was admitted for       AV fistual occlusion.  Patient will be discharged today to [facility], who will transport him to the facility     Group home facilitator aware of discharge, states she is amenable with return and transportation plan. Graves,Alfreda (Other)  [phone] (Home Phone)   Expected Discharge Plan: Group Home ([facility]) Barriers to Discharge: Continued Medical Work up   Patient Goals and CMS Choice        Expected Discharge Plan and Services Expected Discharge Plan: Group Home ([facility])   Discharge Planning Services: CM Consult Post Acute Care Choice:  (Group home) Living arrangements for the past 2 months: [place] ([place] (Other)   [phone] (Home Phone)) Expected Discharge Date: 06/13/22                                    Prior Living Arrangements/Services Living arrangements for the past 2 months: [place] ([place] (Other)   [phone] (Home Phone)) Lives with:: Facility Resident Patient language and need for interpreter reviewed:: Yes Do you feel safe going back to the place where you live?: Yes      Need for Family Participation in Patient Care: Yes (Comment) Care giver support system in place?: Yes (comment)   Criminal Activity/Legal Involvement Pertinent to Current Situation/Hospitalization: No - Comment as needed  Activities of Daily Living Home Assistive Devices/Equipment: Eyeglasses ADL Screening (condition at time of admission) Patient's cognitive ability adequate to safely complete daily activities?: Yes Is the patient deaf or  have difficulty hearing?: No Does the patient have difficulty seeing, even when wearing glasses/contacts?: No Does the patient have difficulty concentrating, remembering, or making decisions?: No Patient able to express need for assistance with ADLs?: Yes Does the patient have difficulty dressing or bathing?: No Independently performs ADLs?: Yes (appropriate for developmental age) Does the patient have difficulty walking or climbing stairs?: No Weakness of Legs: None Weakness of Arms/Hands: None  Permission Sought/Granted Permission sought to share information with : Investment banker, corporate granted to share info w AGENCY: [facility]        Emotional Assessment Appearance:: Appears stated age     Orientation: : Oriented to Situation, Oriented to  Time, Oriented to Place, Oriented to Self Alcohol / Substance Use: Not Applicable Psych Involvement: No (comment)  Admission diagnosis:  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[place]) [T82.898A] Hemodialysis AV fistula thrombosis, initial encounter [facility]) [X90.240X] Dialysis AV fistula malfunction ([place]) [T82.590A] Patient Active Problem List   Diagnosis Date Noted   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[place]) 06/10/2022   Clotted dialysis access ([place]) 06/09/2022   Arteriovenous fistula occlusion ([place])    Dialysis AV fistula malfunction ([place]) 11/29/2021   Fluid overload, unspecified 05/30/2020   Schizophrenia ([place]) 03/09/2020   Anuria 10/02/2019   Xerosis cutis 10/02/2019   Secondary hyperparathyroidism of renal origin ([place]) 06/28/2019   Leukopenia 04/17/2019   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   Elevated ferritin 04/17/2019   Hypokalemia 10/14/2017   ESRD on  hemodialysis ([place]) 10/14/2017   Thrombocytopenia ([place]) 10/14/2017   Acute urinary tract infection 10/13/2017   History of CVA (cerebrovascular accident) 10/13/2017   Chronic schizoaffective disorder ([place]) 04/09/2016   Schizoaffective disorder ([place]) 28/00/3491   Complication of  diabetes mellitus ([place]) 03/09/2016   Stage 5 chronic kidney disease ([place]) 03/09/2016   Thyroid nodule 03/09/2016   Aftercare including intermittent dialysis ([place]) 09/25/2015   Encounter for immunization 07/03/2015   Moderate protein-calorie malnutrition ([place]) 79/15/0569   Complication of vascular dialysis catheter 04/30/2015   Chest pain, unspecified 04/26/2015   Coagulation defect, unspecified ([place]) 04/26/2015   Disorder of phosphorus metabolism, unspecified 04/26/2015   Hypoglycemia, unspecified 04/26/2015   Iron deficiency anemia, unspecified 04/26/2015   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[place]) 04/26/2015   Pain, unspecified 04/26/2015   Pruritus, unspecified 04/26/2015   Shortness of breath 04/26/2015   Type 2 diabetes mellitus without complications ([place]) 79/48/0165   Dyslipidemia 04/26/2015   Essential hypertension 04/26/2015   Schizophrenia, unspecified ([place]) 04/26/2015   Thrombocytopenia, unspecified ([place]) 04/26/2015   ESRD on dialysis ([place]) 04/16/2015   Goiter 02/27/2015   Dry mouth 02/27/2015   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   Controlled type 2 diabetes mellitus without complication, without long-term current use of insulin ([place]) 01/21/2015   Benign hypertension with CKD (chronic kidney disease) stage IV ([place]) 01/21/2015   Encounter for screening for malignant neoplasm of colon 08/02/2014   Vitamin D deficiency 05/12/2013   Cerebrovascular accident (CVA) ([place]) 04/19/2013   Type 2 diabetes mellitus with hyperlipidemia ([place]) 04/19/2013   PCP:  Sharyne Peach, MD Pharmacy:   [facility] [place] [address] [place] [address] Phone: [phone] Fax: [phone]     Social Determinants of Health (SDOH) Interventions    Readmission Risk Interventions    06/13/2022    1:46 PM  Readmission Risk Prevention Plan  Transportation Screening Complete  Medication Review ([place]) Complete  PCP or  Specialist appointment within 3-5 days of discharge Complete  HRI or [place] Complete  SW Recovery Care/Counseling Consult Not Complete  SW Consult Not Complete Comments RNCM assigned to case  Palliative Care Screening Not [place] Not Applicable

## 2022-06-13 NOTE — Progress Notes (Signed)
Patient Discharge  Patient discharged back to group home. Group home to pick up patient. IV removed without any redness or swelling noted at the site. Right femoral catheter removed without difficulty, and pressure was applied to site for a full 10 minutes; dressed with gauze and Tegaderm. Discharge instructions reviewed with patient. Patient verbalizes understanding of instructions. All patient belongings returned to patient.

## 2022-06-13 NOTE — Discharge Summary (Signed)
Physician Discharge Summary  PRITHVI KOOI EGB:[id number] DOB: [birth year] DOA: 06/09/2022  PCP: Sharyne Peach, MD  Admit date: 06/09/2022 Discharge date: 06/13/2022  Admitted From: Group home Disposition: Group home  Recommendations for Outpatient Follow-up:  Follow-up with scheduled dialysis, next dialysis Monday  Home Health: N/A Equipment/Devices: N/A  Discharge Condition: Stable CODE STATUS: Full code Diet recommendation: Low-salt and low-carb diet  Discharge summary: 66 year old gentleman with history of type 2 diabetes, hypertension, dyslipidemia, schizophrenia, ESRD came to the emergency room after missed hemodialysis due to losing access from AV fistula clotting.  He was sent to the ER from dialysis center.  Patient was without any complaints.  At the emergency room vitals are stable.  Potassium 6.2 and repeat potassium 5.5.  AV fistula ultrasound showed near complete thrombosis of the proximal cephalic AV fistula.  Patient was given 1 L of Ringer lactate and 1 g of calcium gluconate.  Received temporary right femoral dialysis catheter and subsequent hemodialysis with improvement of potassium levels. Underwent fistula declotting by vascular surgery on 7/6, able to undergo dialysis today. Medically stable today. Able to go back to group home today.  No changes in medications done. Next dialysis Monday. We will remove femoral catheter before discharge.   Discharge Diagnoses:  Principal Problem:   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HCC) Active Problems:   ESRD on dialysis ([place])   Controlled type 2 diabetes mellitus without complication, without long-term current use of insulin (HCC)   Essential hypertension   Dyslipidemia   Schizoaffective disorder ([place])   Dialysis AV fistula malfunction [facility])    Discharge Instructions  Discharge Instructions     Diet - low sodium heart healthy   Complete by: As directed    Increase activity slowly   Complete by: As directed        Allergies as of 06/13/2022       Reactions   Nsaids Other (See Comments)   Cannot take due to renal disease        Medication List     TAKE these medications    acetaminophen 500 MG tablet Commonly known as: TYLENOL Take 500 mg by mouth every 4 (four) hours as needed for mild pain.   aspirin EC 81 MG tablet Take 81 mg by mouth daily.   benztropine 0.5 MG tablet Commonly known as: COGENTIN Take 0.5 mg by mouth 2 (two) times daily.   carvedilol 25 MG tablet Commonly known as: COREG Take 25 mg by mouth 2 (two) times daily with a meal.   cetirizine 10 MG tablet Commonly known as: ZYRTEC Take 10 mg by mouth every other day.   Coenzyme Q10 100 MG capsule Take 100 mg by mouth at bedtime.   diphenhydrAMINE 25 mg capsule Commonly known as: BENADRYL Take 25 mg by mouth See admin instructions. Take 1 capsule ('25mg'$ ) by mouth nightly as needed for sleep - may take 1 capsule ('25mg'$ ) by mouth during the daytime as needed for itching   DIPHENHYDRAMINE-ZINC OXIDE EX Apply 1 application. topically See admin instructions. Apply small amount of affected area three times daily as needed   docusate sodium 100 MG capsule Commonly known as: COLACE Take 100 mg by mouth 2 (two) times daily.   docusate sodium 100 MG capsule Commonly known as: COLACE Take 200 mg by mouth daily as needed for mild constipation or moderate constipation.   feeding supplement Liqd Take 1 Container by mouth See admin instructions. Take 1 container by mouth three times a week or as needed  of Nutritional Supplement   fluticasone 50 MCG/ACT nasal spray Commonly known as: FLONASE Place 2 sprays into both nostrils daily.   glipiZIDE 2.5 MG 24 hr tablet Commonly known as: GLUCOTROL XL Take 2.5 mg by mouth daily with breakfast.   haloperidol 10 MG tablet Commonly known as: HALDOL Take 5-10 mg by mouth See admin instructions. Take 5 mg in the morning and 10 mg at bedtime   lidocaine-prilocaine  cream Commonly known as: EMLA Apply 1 application topically 3 (three) times a week. Apply over dialysis shunt 30 minutes prior to dialysis.   midodrine 5 MG tablet Commonly known as: PROAMATINE Take 5 mg by mouth 3 (three) times a week. (Take 1 hour prior to dialysis)   mirtazapine 15 MG tablet Commonly known as: REMERON Take 15 mg by mouth at bedtime.   niacin 1000 MG CR tablet Commonly known as: NIASPAN Take 1,000 mg by mouth at bedtime.   polyethylene glycol 17 g packet Commonly known as: MIRALAX / GLYCOLAX Take 17 g by mouth daily as needed (Constipation). Mix 17 g (1 capful) in to 8 ounces of Juice/Water   pravastatin 40 MG tablet Commonly known as: PRAVACHOL Take 40 mg by mouth at bedtime.   sevelamer carbonate 800 MG tablet Commonly known as: RENVELA Take 2 tablets (1,600 mg total) by mouth 3 (three) times daily with meals.   Vitamin D3 125 MCG (5000 UT) Caps Take 5,000 Units by mouth once a week.        Allergies  Allergen Reactions   Nsaids Other (See Comments)    Cannot take due to renal disease    Consultations: Vascular surgery Nephrology   Procedures/Studies: PERIPHERAL VASCULAR CATHETERIZATION  Result Date: 06/12/2022 See surgical note for result.  PERIPHERAL VASCULAR CATHETERIZATION  Result Date: 06/11/2022 See surgical note for result.  [place] Dialysis Access  Result Date: 06/10/2022 CLINICAL DATA:  Inability to cannulate dialysis AV fistula EXAM: ULTRASOUND DIALYSIS ACCESS TECHNIQUE: Sonographic evaluation of the left upper extremity was performed for evaluation of the patient's known AV fistula. Additionally duplex evaluation was performed. COMPARISON:  Comparison is made with prior AV fistulagram from 03/27/2022 FINDINGS: Evaluation of the brachiocephalic arteriovenous anastomosis shows patency although almost immediate thrombosis of the outflow cephalic vein is noted. The native subclavian vein beyond the cephalic vein is patent. Echogenic  material is noted throughout the course of the cephalic vein consistent with thrombosed AV fistula. IMPRESSION: Near complete thrombosis of the brachiocephalic AV fistula as described. Vascular surgery consultation is recommended. Electronically Signed   By: Inez Catalina M.D.   On: 06/10/2022 01:38   VAS US DUPLEX DIALYSIS ACCESS (AVF,AVG)  Result Date: 05/21/2022 DIALYSIS ACCESS Patient Name:  Lawrence [name]  Date of Exam:   05/15/2022 Medical Rec #: [id number]         Accession #:    [id number] Date of Birth: 1956/01/28         Patient Gender: M Patient Age:   52 years Exam Location:  [facility] Procedure:      VAS US DUPLEX DIALYSIS ACCESS (AVF, AVG) Referring Phys: [name] --------------------------------------------------------------------------------  Access Site: Left Upper Extremity. Access Type: Brachial-cephalic AVF. History: 03/29/15: Left brach-ceph AVF created;          02/24/18: PTA of mid AVF;          09/03/2020 PTA of confluence          06/27/2021 PTA of confluence  03/25/2022: Left Arm Shuntogram and Central Venogram. Catheter directed          thrombolysis with 4 mg of TPA. Mecahnical Thrombectomy to the Left          Brachiocephalic graft with the 7 CAT pneumbra device. PTA of the Left          Brachiocephalic graft with a 8 x 40 [place].           03/27/2022: Left Arm Fistulagram including central Venogram. Comparison Study: 01/02/2022 Performing Technologist: Almira Coaster RVS  Examination Guidelines: A complete evaluation includes B-mode imaging, spectral Doppler, color Doppler, and power Doppler as needed of all accessible portions of each vessel. Unilateral testing is considered an integral part of a complete examination. Limited examinations for reoccurring indications may be performed as noted.  Findings: +--------------------+----------+-----------------+--------+ AVF                 PSV (cm/s)Flow Vol (mL/min)Comments  +--------------------+----------+-----------------+--------+ Native artery inflow   215          2663                +--------------------+----------+-----------------+--------+ AVF Anastomosis        643                              +--------------------+----------+-----------------+--------+  +---------------+----------+-------------+----------+--------+ OUTFLOW VEIN   PSV (cm/s)Diameter (cm)Depth (cm)Describe +---------------+----------+-------------+----------+--------+ Subclavian vein   113                                    +---------------+----------+-------------+----------+--------+ Confluence        497                                    +---------------+----------+-------------+----------+--------+ Shoulder           93                                    +---------------+----------+-------------+----------+--------+ Prox UA           149                                    +---------------+----------+-------------+----------+--------+ Mid UA            168                                    +---------------+----------+-------------+----------+--------+ Dist UA           516                                    +---------------+----------+-------------+----------+--------+  +--------------+-------------+---------+---------+---------+-------------------+               Diameter (cm)  Depth  Branching   PSV       Flow Volume                                  (cm)             (  cm/s)       (ml/min)       +--------------+-------------+---------+---------+---------+-------------------+ Lt Rad Art                                      18                        Dist                                                                      +--------------+-------------+---------+---------+---------+-------------------+  Summary: The Left Brachiocephalic AVF appears to be patent throughout; Flow Volume appears to be Normal. Increased Velocities  seen in the Anastomosis site; no apparent stenosis seen .  *See table(s) above for measurements and observations.  Diagnosing physician: [name] MD Electronically signed by [name] MD on 05/21/2022 at 9:38:36 AM.   --------------------------------------------------------------------------------   Final    (Echo, Carotid, EGD, Colonoscopy, ERCP)    Subjective: Patient seen receiving hemodialysis.  As usual he has no complaints.   Discharge Exam: Vitals:   06/13/22 1030 06/13/22 1045  BP: 130/78   Pulse: 94 96  Resp: 16 14  Temp:    SpO2: 98% 99%   Vitals:   06/13/22 1000 06/13/22 1015 06/13/22 1030 06/13/22 1045  BP: 127/74 121/82 130/78   Pulse: 77 81 94 96  Resp: '17 16 16 14  '$ Temp:      TempSrc:      SpO2: 98% 98% 98% 99%  Weight:      Height:        General: Pt is alert, awake, not in acute distress On room air.  Receiving dialysis.  Flat affect.  Interactive appropriately to the questions. Cardiovascular: RRR, S1/S2 +, no rubs, no gallops Respiratory: CTA bilaterally, no wheezing, no rhonchi Abdominal: Soft, NT, ND, bowel sounds + Extremities:  Left upper extremity AV fistula with thrill present.  Receiving active hemodialysis at this time. Right femoral HD catheter present.    The results of significant diagnostics from this hospitalization (including imaging, microbiology, ancillary and laboratory) are listed below for reference.     Microbiology: No results found for this or any previous visit (from the past 240 hour(s)).   Labs: BNP (last 3 results) No results for input(s): "BNP" in the last 8760 hours. Basic Metabolic Panel: Recent Labs  Lab 06/09/22 2343 06/10/22 0031 06/10/22 1705 06/11/22 0358 06/11/22 1208 06/11/22 1454 06/12/22 0856 06/13/22 0930  NA 143  --  139 139  --   --  134* 133*  K 6.2*   < > 5.8* 6.6* 5.8* 5.5* 5.3* 4.5  CL 101  --  97* 99  --   --  97* 96*  CO2 29  --  27 25  --   --  25 28  GLUCOSE 91  --  157* 99  --   --   103* 100*  BUN 89*  --  98* 102*  --   --  51* 40*  CREATININE 14.62*  --  16.02* 16.25*  --   --  11.07* 9.24*  CALCIUM 9.8  --  9.1 9.3  --   --  8.8* 8.8*  PHOS  --   --   --   --   --   --  5.1* 5.5*   < > = values in this interval not displayed.   Liver Function Tests: Recent Labs  Lab 06/09/22 2343 06/12/22 0856 06/13/22 0930  AST 16  --   --   ALT 11  --   --   ALKPHOS 150*  --   --   BILITOT 1.0  --   --   PROT 7.6  --   --   ALBUMIN 3.8 3.0* 3.0*   No results for input(s): "LIPASE", "AMYLASE" in the last 168 hours. No results for input(s): "AMMONIA" in the last 168 hours. CBC: Recent Labs  Lab 06/09/22 2343 06/10/22 0521 06/11/22 0358 06/12/22 0856 06/13/22 0930  WBC 5.5 5.1 5.3 4.1 4.0  NEUTROABS 3.1  --   --   --   --   HGB 12.5* 11.6* 11.1* 11.3* 10.8*  HCT 40.4 36.9* 34.2* 34.9* 33.4*  MCV 102.3* 101.4* 98.8 96.9 98.8  PLT 69* 76* 72* 70* 64*   Cardiac Enzymes: No results for input(s): "CKTOTAL", "CKMB", "CKMBINDEX", "TROPONINI" in the last 168 hours. BNP: Invalid input(s): "POCBNP" CBG: Recent Labs  Lab 06/12/22 0802 06/12/22 1140 06/12/22 1214 06/12/22 2108 06/13/22 0800  GLUCAP 110* 105* 102* 131* 103*   D-Dimer No results for input(s): "DDIMER" in the last 72 hours. Hgb A1c No results for input(s): "HGBA1C" in the last 72 hours. Lipid Profile No results for input(s): "CHOL", "HDL", "LDLCALC", "TRIG", "CHOLHDL", "LDLDIRECT" in the last 72 hours. Thyroid function studies No results for input(s): "TSH", "T4TOTAL", "T3FREE", "THYROIDAB" in the last 72 hours.  Invalid input(s): "FREET3" Anemia work up No results for input(s): "VITAMINB12", "FOLATE", "FERRITIN", "TIBC", "IRON", "RETICCTPCT" in the last 72 hours. Urinalysis    Component Value Date/Time   COLORURINE BROWN (A) 10/13/2017 1703   APPEARANCEUR HAZY (A) 10/13/2017 1703   APPEARANCEUR Clear 12/13/2014 1354   LABSPEC 1.020 10/13/2017 1703   LABSPEC 1.006 12/13/2014 1354    PHURINE 8.5 (H) 10/13/2017 1703   GLUCOSEU 250 (A) 10/13/2017 1703   GLUCOSEU 50 mg/dL 12/13/2014 1354   HGBUR LARGE (A) 10/13/2017 1703   BILIRUBINUR MODERATE (A) 10/13/2017 1703   BILIRUBINUR Negative 12/13/2014 1354   KETONESUR TRACE (A) 10/13/2017 1703   PROTEINUR >300 (A) 10/13/2017 1703   NITRITE POSITIVE (A) 10/13/2017 1703   LEUKOCYTESUR LARGE (A) 10/13/2017 1703   LEUKOCYTESUR Negative 12/13/2014 1354   Sepsis Labs Recent Labs  Lab 06/10/22 0521 06/11/22 0358 06/12/22 0856 06/13/22 0930  WBC 5.1 5.3 4.1 4.0   Microbiology No results found for this or any previous visit (from the past 240 hour(s)).   Time coordinating discharge: 35 minutes  SIGNED:   Barb Merino, MD  [facility] 06/13/2022, 10:53 AM

## 2022-08-01 ENCOUNTER — Other Ambulatory Visit: Payer: Self-pay

## 2022-08-01 ENCOUNTER — Telehealth (INDEPENDENT_AMBULATORY_CARE_PROVIDER_SITE_OTHER): Payer: Self-pay

## 2022-08-01 ENCOUNTER — Emergency Department
Admission: EM | Admit: 2022-08-01 | Discharge: 2022-08-02 | Disposition: A | Discharge status: Home / Self Care | Payer: Medicare Other | Attending: Emergency Medicine | Admitting: Emergency Medicine

## 2022-08-01 ENCOUNTER — Emergency Department: Payer: Medicare Other

## 2022-08-01 DIAGNOSIS — Y732 Prosthetic and other implants, materials and accessory gastroenterology and urology devices associated with adverse incidents: Secondary | ICD-10-CM | POA: Insufficient documentation

## 2022-08-01 DIAGNOSIS — T82590A Other mechanical complication of surgically created arteriovenous fistula, initial encounter: Secondary | ICD-10-CM | POA: Insufficient documentation

## 2022-08-01 DIAGNOSIS — Z992 Dependence on renal dialysis: Secondary | ICD-10-CM | POA: Diagnosis not present

## 2022-08-01 DIAGNOSIS — T829XXA Unspecified complication of cardiac and vascular prosthetic device, implant and graft, initial encounter: Secondary | ICD-10-CM

## 2022-08-01 DIAGNOSIS — Z7982 Long term (current) use of aspirin: Secondary | ICD-10-CM | POA: Insufficient documentation

## 2022-08-01 DIAGNOSIS — Z79899 Other long term (current) drug therapy: Secondary | ICD-10-CM | POA: Diagnosis not present

## 2022-08-01 LAB — COMPREHENSIVE METABOLIC PANEL: Anion gap: 16 — ABNORMAL HIGH (ref 5–15)

## 2022-08-01 MED ORDER — LOKELMA 10 G PO PACK
10.0000 g | PACK | Freq: Every day | ORAL | 0 refills | Status: AC
Start: 2022-08-01 — End: 2022-08-04

## 2022-08-01 NOTE — ED Notes (Signed)
Called lab staff to notify phlebotomy no longer needs to recollect lt grn tube as provider TG d/c order.

## 2022-08-01 NOTE — Telephone Encounter (Signed)
I received a fax from [facility] wanting the patient to have a declot of his AV fistula. Patient is scheduled with Dr. Lucky Cowboy on 08/04/22 with a 11:45 am arrival time to the [facility] for a left am declot. I spoke with Sharyn Lull at [facility] [employer] regarding the patient and she was going to call Alfreda the caregiver at the facility the patient resides at and give her the pre-procedure instructions. Sharyn Lull stated the patient has not had dialysis since Wednesday, nor does he have a permcath and they may just send him to the ED for evaluation as well.

## 2022-08-01 NOTE — ED Notes (Addendum)
Pt from [facility] and confirms Lawrence [name] is who needs to be called to set up ride back. Called Lawrence but not answer and voice mailbox full so couldn't leave message with name and number to call this RN back. [phone].

## 2022-08-01 NOTE — ED Notes (Signed)
Attempted to contact Lawrence [name] x2 at [phone] as pt stated he may be able to help get pt ride back to [facility]. No answer x2 as line read "busy" both times attempted. Charge nurse April notified.

## 2022-08-01 NOTE — ED Triage Notes (Addendum)
Pt presents to ED with c/o of L arm fistula not working. Pt is a Mon, Wed, Friday dialysis pt. Pt states he had a full TX this past Wed. Pt states he is a Dr. Zollie Scale pt and he is aware he is coming. Caregiver with pt. Also states the vascular team is aware but states they cant do anything until Monday.   Pt denies any complaints to this RN at this time.   Pt lives at [facility].

## 2022-08-01 NOTE — Discharge Instructions (Signed)
Please take medication prescribed Saturday Sunday and Monday to help keep your potassium levels down.  Please follow-up with vein and vascular Monday morning.  Return to the ER immediately for any urgent changes in your health.

## 2022-08-01 NOTE — ED Provider Notes (Signed)
[facility] EMERGENCY DEPARTMENT Provider Note   CSN: [id number] Arrival date & time: 08/01/22  1804     History  Chief Complaint  Patient presents with   Vascular Access Problem    Lawrence [name] is a 66 y.o. male presents to the emergency department for issues with his vascular access.  Patient states he underwent dialysis Wednesday and today his fistula was not working.  He has an appointment scheduled on Monday with vascular for declotting procedure.  Patient denies any pain, discomfort.  No chest pain, shortness of breath.  No swelling in his lower extremities.  Overall he is doing well with no complaints.  HPI     Home Medications Prior to Admission medications   Medication Sig Start Date End Date Taking? Authorizing Provider  acetaminophen (TYLENOL) 500 MG tablet Take 500 mg by mouth every 4 (four) hours as needed for mild pain.    [provider]  aspirin EC 81 MG tablet Take 81 mg by mouth daily.     [provider]  benztropine (COGENTIN) 0.5 MG tablet Take 0.5 mg by mouth 2 (two) times daily.    [provider]  carvedilol (COREG) 25 MG tablet Take 25 mg by mouth 2 (two) times daily with a meal. 04/04/22   [provider]  cetirizine (ZYRTEC) 10 MG tablet Take 10 mg by mouth every other day.    [provider]  Cholecalciferol (VITAMIN D3) 125 MCG (5000 UT) CAPS Take 5,000 Units by mouth once a week.    [provider]  Coenzyme Q10 100 MG capsule Take 100 mg by mouth at bedtime.    [provider]  diphenhydrAMINE (BENADRYL) 25 mg capsule Take 25 mg by mouth See admin instructions. Take 1 capsule ('25mg'$ ) by mouth nightly as needed for sleep - may take 1 capsule ('25mg'$ ) by mouth during the daytime as needed for itching    [provider]  DIPHENHYDRAMINE-ZINC OXIDE EX Apply 1 application. topically See admin instructions. Apply small amount of affected area three times daily as  needed    [provider]  docusate sodium (COLACE) 100 MG capsule Take 100 mg by mouth 2 (two) times daily.    [provider]  docusate sodium (COLACE) 100 MG capsule Take 200 mg by mouth daily as needed for mild constipation or moderate constipation.    [provider]  feeding supplement (BOOST HIGH PROTEIN) LIQD Take 1 Container by mouth See admin instructions. Take 1 container by mouth three times a week or as needed of Nutritional Supplement    [provider]  fluticasone (FLONASE) 50 MCG/ACT nasal spray Place 2 sprays into both nostrils daily.    [provider]  glipiZIDE (GLUCOTROL XL) 2.5 MG 24 hr tablet Take 2.5 mg by mouth daily with breakfast.     [provider]  haloperidol (HALDOL) 10 MG tablet Take 5-10 mg by mouth See admin instructions. Take 5 mg in the morning and 10 mg at bedtime    [provider]  lidocaine-prilocaine (EMLA) cream Apply 1 application topically 3 (three) times a week. Apply over dialysis shunt 30 minutes prior to dialysis.    [provider]  midodrine (PROAMATINE) 5 MG tablet Take 5 mg by mouth 3 (three) times a week. (Take 1 hour prior to dialysis)    [provider]  mirtazapine (REMERON) 15 MG tablet Take 15 mg by mouth at bedtime.    [provider]  niacin (NIASPAN) 1000 MG CR tablet Take 1,000 mg by mouth at bedtime.     [provider]  polyethylene glycol (MIRALAX / GLYCOLAX) 17 g packet Take 17 g by mouth daily as needed (Constipation). Mix 17 g (1 capful) in to 8 ounces of Juice/Water    [provider]  pravastatin (PRAVACHOL) 40 MG tablet Take 40 mg by mouth at bedtime.     [provider]  sevelamer carbonate (RENVELA) 800 MG tablet Take 2 tablets (1,600 mg total) by mouth 3 (three) times daily with meals. 12/05/21   [name], MD      Allergies    Nsaids    Review of Systems   Review of Systems  Physical  Exam Updated Vital Signs BP (!) 143/101 (BP Location: Right Arm)   Pulse 70   Temp 98.3 F (36.8 C) (Oral)   Resp 19   Ht '5\' 7"'$  (1.702 m)   Wt 72.6 kg   SpO2 99%   BMI 25.06 kg/m  Physical Exam Constitutional:      Appearance: He is well-developed.  HENT:     Head: Normocephalic and atraumatic.  Eyes:     Conjunctiva/sclera: Conjunctivae normal.  Cardiovascular:     Rate and Rhythm: Normal rate.  Pulmonary:     Effort: Pulmonary effort is normal. No respiratory distress.  Musculoskeletal:        General: Normal range of motion.     Cervical back: Normal range of motion.  Skin:    General: Skin is warm.     Findings: No rash.     Comments: Left AV fistula not functioning, no palpable humming present.  No signs of infection.  No abnormal swelling.  Neurological:     Mental Status: He is alert and oriented to person, place, and time.  Psychiatric:        Behavior: Behavior normal.        Thought Content: Thought content normal.     ED Results / Procedures / Treatments   Labs (all labs ordered are listed, but only abnormal results are displayed) Labs Reviewed  COMPREHENSIVE METABOLIC PANEL - Abnormal; Notable for the following components:      Result Value   Sodium 133 (*)    Chloride 94 (*)    Creatinine, Ser 10.56 (*)    Calcium 8.8 (*)    Albumin 3.4 (*)    Alkaline Phosphatase 149 (*)    GFR, Estimated 5 (*)    Anion gap 16 (*)    All other components within normal limits  CBC    EKG None  Radiology DG Chest 2 View  Result Date: 08/01/2022 CLINICAL DATA:  Fluid overload EXAM: CHEST - 2 VIEW COMPARISON:  10/14/2017 FINDINGS: The heart size and mediastinal contours are within normal limits. Both lungs are clear. The visualized skeletal structures are unremarkable. IMPRESSION: No active cardiopulmonary disease. Electronically Signed   By: Ulyses Jarred M.D.   On: 08/01/2022 20:05    Procedures Procedures    Medications Ordered in ED Medications - No  data to display  ED Course/ Medical Decision Making/ A&P                           Medical Decision Making Amount and/or Complexity of Data Reviewed Labs: ordered. Radiology: ordered.   66 year old dialysis patient presents with complications with his left AV fistula.  Patient's fistula not working properly, he was unable to receive  his dialysis today, typically has dialysis on Monday Wednesday Friday.  He is scheduled in 3 days to see vein and vascular to evaluate left AV fistula.  Patient's vital signs are stable.  He has no complaints.  Overall he is doing well.  Blood work was obtained today showing no significant abnormalities, patient's labs at baseline.  Patient's potassium 4.9.  Chest ray was obtained showing no pleural effusions.  Discussed lab work and patient's case with nephrologist on-call who recommended giving a dose of Lokelma daily over the next 3 days until he can restart his dialysis.  He is to return to the ER for any complications or any urgent changes in his health. Final Clinical Impression(s) / ED Diagnoses Final diagnoses:  None    Rx / DC Orders ED Discharge Orders     None         Renata Caprice 08/01/22 2344    Nena Polio, MD 08/02/22 1600

## 2022-08-01 NOTE — ED Notes (Signed)
Pt denies any needs currently.  

## 2022-08-01 NOTE — ED Notes (Signed)
Patient taken to imaging. Caregiver at bedside.

## 2022-08-01 NOTE — ED Notes (Signed)
See triage note. Pt reports had dialysis Monday and Wednesday but not Friday as site is clotted; states history of this happening often. Pt's resp reg/unlabored, skin dry, calmly sitting in chair. Pt denies SOB and HA.

## 2022-08-01 NOTE — ED Notes (Signed)
Lab called this RN to notify another lt grn tube needed. Pt is hard stick; lab staff states they will have phlebotomist stop by to complete recollect.

## 2022-08-02 DIAGNOSIS — T82590A Other mechanical complication of surgically created arteriovenous fistula, initial encounter: Secondary | ICD-10-CM | POA: Diagnosis not present

## 2022-08-02 LAB — COMPREHENSIVE METABOLIC PANEL
ALT: 14 U/L (ref 0–44)
AST: 16 U/L (ref 15–41)
Albumin: 3.4 g/dL — ABNORMAL LOW (ref 3.5–5.0)
Alkaline Phosphatase: 149 U/L — ABNORMAL HIGH (ref 38–126)
Anion gap: 16 — ABNORMAL HIGH (ref 5–15)
CO2: 23 mmol/L (ref 22–32)
Calcium: 8.8 mg/dL — ABNORMAL LOW (ref 8.9–10.3)
Chloride: 94 mmol/L — ABNORMAL LOW (ref 98–111)
Creatinine, Ser: 10.56 mg/dL — ABNORMAL HIGH (ref 0.61–1.24)
GFR, Estimated: 5 mL/min — ABNORMAL LOW (ref >60)
Glucose, Bld: 89 mg/dL (ref 70–99)
Potassium: 4.9 mmol/L (ref 3.5–5.1)
Sodium: 133 mmol/L — ABNORMAL LOW (ref 135–145)
Total Bilirubin: 0.8 mg/dL (ref 0.3–1.2)
Total Protein: 6.8 g/dL (ref 6.5–8.1)

## 2022-08-02 MED ORDER — MIRTAZAPINE 15 MG PO TABS
15.0000 mg | ORAL_TABLET | Freq: Once | ORAL | Status: AC
Start: 2022-08-02 — End: 2022-08-02
  Administered 2022-08-02: 15 mg via ORAL
  Filled 2022-08-02: qty 1

## 2022-08-02 MED ORDER — CARVEDILOL 25 MG PO TABS
25.0000 mg | ORAL_TABLET | Freq: Once | ORAL | Status: AC
Start: 2022-08-02 — End: 2022-08-02
  Administered 2022-08-02: 25 mg via ORAL
  Filled 2022-08-02: qty 1

## 2022-08-02 MED ORDER — BENZTROPINE MESYLATE 1 MG PO TABS
0.5000 mg | ORAL_TABLET | Freq: Once | ORAL | Status: AC
  Administered 2022-08-02: 0.5 mg via ORAL
  Filled 2022-08-02: qty 1

## 2022-08-02 MED ORDER — PRAVASTATIN SODIUM 20 MG PO TABS
40.0000 mg | ORAL_TABLET | Freq: Once | ORAL | Status: AC
  Administered 2022-08-02: 40 mg via ORAL
  Filled 2022-08-02: qty 2

## 2022-08-02 MED ORDER — HALOPERIDOL 2 MG PO TABS
10.0000 mg | ORAL_TABLET | Freq: Once | ORAL | Status: AC
Start: 2022-08-02 — End: 2022-08-02
  Administered 2022-08-02: 10 mg via ORAL
  Filled 2022-08-02: qty 5

## 2022-08-02 NOTE — ED Notes (Signed)
Pt called Lawrence [name] using his personal phone as it was one extra number he thought to try. Ann answered and stated she'll call Bethena Roys who is closer as Lelon Frohlich lives 4 hours away.

## 2022-08-02 NOTE — ED Notes (Signed)
Care handoff given to [facility], RN. Dr. Charlsie Quest wrote for patient to receive night time medications prior to discharge.

## 2022-08-02 NOTE — ED Notes (Signed)
Pt brought to hallway 13 at this time, this RN now assuming care.

## 2022-08-02 NOTE — ED Notes (Signed)
This RN spoke with patient's sister Sohil Timko via patient's personal telephone. Pt's sister Lelon Frohlich reports patient's other sister Bethena Roys will be coming from [facility] to pick patient up and take patient home with her since [facility] staff is unable to be reached at this time. Pt's sister Lelon Frohlich is also requesting due to patinet having none of his night time psychiatric medications and not having access to his daytime medications, if patient can have his night time meds administered by ED staff. Explained to Lelon Frohlich that this RN just assumed care of patient however would speak with EDP regarding request for night time medications to be administered in EDP prior to patient's discharge.

## 2022-08-04 ENCOUNTER — Encounter
Admission: RE | Disposition: A | Discharge status: Nursing Home (Includes ICF) | Payer: Self-pay | Source: Ambulatory Visit | Attending: Vascular Surgery

## 2022-08-04 ENCOUNTER — Encounter: Payer: Self-pay | Admitting: Vascular Surgery

## 2022-08-04 ENCOUNTER — Ambulatory Visit
Admission: RE | Admit: 2022-08-04 | Discharge: 2022-08-04 | Disposition: A | Discharge status: Nursing Home (Includes ICF) | Payer: Medicare Other | Source: Ambulatory Visit | Attending: Vascular Surgery | Admitting: Vascular Surgery

## 2022-08-04 DIAGNOSIS — T82858A Stenosis of vascular prosthetic devices, implants and grafts, initial encounter: Secondary | ICD-10-CM

## 2022-08-04 DIAGNOSIS — Z992 Dependence on renal dialysis: Secondary | ICD-10-CM | POA: Insufficient documentation

## 2022-08-04 DIAGNOSIS — I120 Hypertensive chronic kidney disease with stage 5 chronic kidney disease or end stage renal disease: Secondary | ICD-10-CM | POA: Diagnosis not present

## 2022-08-04 DIAGNOSIS — E1122 Type 2 diabetes mellitus with diabetic chronic kidney disease: Secondary | ICD-10-CM | POA: Insufficient documentation

## 2022-08-04 DIAGNOSIS — I953 Hypotension of hemodialysis: Secondary | ICD-10-CM | POA: Insufficient documentation

## 2022-08-04 DIAGNOSIS — T82868A Thrombosis of vascular prosthetic devices, implants and grafts, initial encounter: Secondary | ICD-10-CM

## 2022-08-04 DIAGNOSIS — I2510 Atherosclerotic heart disease of native coronary artery without angina pectoris: Secondary | ICD-10-CM | POA: Insufficient documentation

## 2022-08-04 DIAGNOSIS — N186 End stage renal disease: Secondary | ICD-10-CM | POA: Diagnosis not present

## 2022-08-04 DIAGNOSIS — Y841 Kidney dialysis as the cause of abnormal reaction of the patient, or of later complication, without mention of misadventure at the time of the procedure: Secondary | ICD-10-CM | POA: Diagnosis not present

## 2022-08-04 HISTORY — PX: PERIPHERAL VASCULAR THROMBECTOMY: CATH118306

## 2022-08-04 LAB — GLUCOSE, CAPILLARY: Glucose-Capillary: 92 mg/dL (ref 70–99)

## 2022-08-04 LAB — POTASSIUM (ARMC VASCULAR LAB ONLY): Potassium (ARMC vascular lab): 4.7 mmol/L (ref 3.5–5.1)

## 2022-08-04 SURGERY — PERIPHERAL VASCULAR THROMBECTOMY
Anesthesia: Moderate Sedation | Laterality: Left

## 2022-08-04 MED ORDER — CEFAZOLIN SODIUM-DEXTROSE 1-4 GM/50ML-% IV SOLN: 1.0000 g | INTRAVENOUS | Status: AC

## 2022-08-04 MED ORDER — IODIXANOL 320 MG/ML IV SOLN
INTRAVENOUS | Status: DC | PRN
  Administered 2022-08-04: 35 mL

## 2022-08-04 MED ORDER — HEPARIN SODIUM (PORCINE) 1000 UNIT/ML DIALYSIS
1000.0000 [IU] | INTRAMUSCULAR | Status: DC | PRN
Start: 2022-08-04 — End: 2022-08-04

## 2022-08-04 MED ORDER — CEFAZOLIN SODIUM-DEXTROSE 1-4 GM/50ML-% IV SOLN
INTRAVENOUS | Status: AC
  Administered 2022-08-04: 1 g via INTRAVENOUS
  Filled 2022-08-04: qty 50

## 2022-08-04 MED ORDER — LIDOCAINE-PRILOCAINE 2.5-2.5 % EX CREA: 1.0000 | TOPICAL_CREAM | CUTANEOUS | Status: DC | PRN

## 2022-08-04 MED ORDER — MIDAZOLAM HCL 2 MG/2ML IJ SOLN
INTRAMUSCULAR | Status: DC | PRN
  Administered 2022-08-04: 2 mg via INTRAVENOUS

## 2022-08-04 MED ORDER — ANTICOAGULANT SODIUM CITRATE 4% (200MG/5ML) IV SOLN: 5.0000 mL | Status: DC | PRN

## 2022-08-04 MED ORDER — LIDOCAINE HCL (PF) 1 % IJ SOLN
5.0000 mL | INTRAMUSCULAR | Status: DC | PRN
Start: 2022-08-04 — End: 2022-08-04

## 2022-08-04 MED ORDER — SODIUM CHLORIDE 0.9 % IV SOLN: INTRAVENOUS | Status: DC

## 2022-08-04 MED ORDER — FAMOTIDINE 20 MG PO TABS
40.0000 mg | ORAL_TABLET | Freq: Once | ORAL | Status: DC | PRN
Start: 2022-08-04 — End: 2022-08-04

## 2022-08-04 MED ORDER — PENTAFLUOROPROP-TETRAFLUOROETH EX AERO: 1.0000 | INHALATION_SPRAY | CUTANEOUS | Status: DC | PRN

## 2022-08-04 MED ORDER — ALTEPLASE 2 MG IJ SOLR: 2.0000 mg | Freq: Once | INTRAMUSCULAR | Status: DC | PRN

## 2022-08-04 MED ORDER — FENTANYL CITRATE (PF) 100 MCG/2ML IJ SOLN
INTRAMUSCULAR | Status: AC
  Filled 2022-08-04: qty 2

## 2022-08-04 MED ORDER — HYDROMORPHONE HCL 1 MG/ML IJ SOLN: 1.0000 mg | Freq: Once | INTRAMUSCULAR | Status: DC | PRN

## 2022-08-04 MED ORDER — MIDAZOLAM HCL 2 MG/ML PO SYRP
8.0000 mg | ORAL_SOLUTION | Freq: Once | ORAL | Status: DC | PRN
Start: 2022-08-04 — End: 2022-08-04

## 2022-08-04 MED ORDER — FENTANYL CITRATE (PF) 100 MCG/2ML IJ SOLN
INTRAMUSCULAR | Status: DC | PRN
  Administered 2022-08-04: 50 ug via INTRAVENOUS
  Administered 2022-08-04: 25 ug via INTRAVENOUS

## 2022-08-04 MED ORDER — DIPHENHYDRAMINE HCL 50 MG/ML IJ SOLN: 50.0000 mg | Freq: Once | INTRAMUSCULAR | Status: DC | PRN

## 2022-08-04 MED ORDER — METHYLPREDNISOLONE SODIUM SUCC 125 MG IJ SOLR: 125.0000 mg | Freq: Once | INTRAMUSCULAR | Status: DC | PRN

## 2022-08-04 MED ORDER — HEPARIN SODIUM (PORCINE) 1000 UNIT/ML IJ SOLN
INTRAMUSCULAR | Status: DC | PRN
  Administered 2022-08-04: 4000 [IU] via INTRAVENOUS

## 2022-08-04 MED ORDER — MIDAZOLAM HCL 2 MG/2ML IJ SOLN
INTRAMUSCULAR | Status: AC
  Filled 2022-08-04: qty 2

## 2022-08-04 MED ORDER — ALTEPLASE 1 MG/ML SYRINGE FOR VASCULAR PROCEDURE
INTRAMUSCULAR | Status: DC | PRN
  Administered 2022-08-04: 4 mg

## 2022-08-04 MED ORDER — CHLORHEXIDINE GLUCONATE CLOTH 2 % EX PADS: 6.0000 | MEDICATED_PAD | Freq: Every day | CUTANEOUS | Status: DC

## 2022-08-04 MED ORDER — ONDANSETRON HCL 4 MG/2ML IJ SOLN: 4.0000 mg | Freq: Four times a day (QID) | INTRAMUSCULAR | Status: DC | PRN

## 2022-08-04 MED ORDER — HEPARIN SODIUM (PORCINE) 1000 UNIT/ML IJ SOLN
INTRAMUSCULAR | Status: AC
  Filled 2022-08-04: qty 10

## 2022-08-04 SURGICAL SUPPLY — 11 items
BALLN LUTONIX AV 7X60X75 (BALLOONS) ×1
BALLOON LUTONIX AV 7X60X75 (BALLOONS) IMPLANT
CANISTER PENUMBRA ENGINE (MISCELLANEOUS) IMPLANT
CANNULA 5F STIFF (CANNULA) IMPLANT
CATH EMBOLECTOMY 5FR (BALLOONS) IMPLANT
CATH INDIGO 7D KIT (CATHETERS) IMPLANT
GUIDEWIRE VERSACORE 175CM (WIRE) IMPLANT
KIT ENCORE 26 ADVANTAGE (KITS) IMPLANT
PACK ANGIOGRAPHY (CUSTOM PROCEDURE TRAY) ×1 IMPLANT
SHEATH BRITE TIP 6FRX5.5 (SHEATH) IMPLANT
SHEATH BRITE TIP 7FRX5.5 (SHEATH) IMPLANT

## 2022-08-04 NOTE — Op Note (Signed)
[facility]    OPERATIVE NOTE   PROCEDURE: 1.  Left brachiocephalic arteriovenous fistula and jump graft revision cannulation under ultrasound guidance in both a retrograde and then antegrade fashion crossing 2.  Left arm fistulagram and central venogram 3.  Catheter directed thrombolysis with 4 mg of TPA  4.  Mechanical thrombectomy to the left arm AV fistula and jump graft with the Penumbra Cat 7D catheter 5.  Fogarty embolectomy for residual arterial plug 6.  Percutaneous transluminal angioplasty of cephalic vein subclavian vein confluence with 7 mm diameter by 6 cm length Lutonix drug-coated angioplasty balloon  PRE-OPERATIVE DIAGNOSIS: 1. ESRD 2.  Thrombosed left brachiocephalic arteriovenous fistula and previous jump graft  POST-OPERATIVE DIAGNOSIS: same as above   SURGEON: [name], MD  ANESTHESIA: local with Moderate Conscious Sedation for 26 minutes using 2 mg of Versed and 75 mcg of Fentanyl  ESTIMATED BLOOD LOSS: 200 cc  FINDING(S): Thrombosed AV fistula and jump graft with 60 to 70% stenosis of the cephalic vein subclavian vein confluence  SPECIMEN(S):  None  CONTRAST: 35 cc  FLUORO TIME: 3.8 minutes  INDICATIONS: Patient is a 66 y.o.male who presents with a thrombosed left brachiocephalic arteriovenous fistula.  This fistula has already been revised with an Artegraft jump graft.  The patient is scheduled for an attempted declot and fistulagram.  The patient is aware the risks include but are not limited to: bleeding, infection, thrombosis of the cannulated access, and possible anaphylactic reaction to the contrast.  The patient is aware of the risks of the procedure and elects to proceed forward.  DESCRIPTION: After full informed written consent was obtained, the patient was brought back to the angiography suite and placed supine upon the angiography table.  The patient was connected to monitoring equipment. Moderate conscious sedation was  administered during a face to face encounter with the patient throughout the procedure with my supervision of the RN administering medicines and monitoring the patient's vital signs, pulse oximetry, telemetry and mental status throughout from the start of the procedure until the patient was taken to the recovery room. The left arm was prepped and draped in the standard fashion for a percutaneous access intervention.  Under ultrasound guidance, the left brachiocephalic arteriovenous fistula was cannulated with a micropuncture needle under direct ultrasound guidance due to the pulseless nature of the fistula in both an antegrade and a retrograde fashion crossing, and permanent images were performed.  The microwire was advanced and the needle was exchanged for the a microsheath.  I then upsized to a 6 Fr Sheath for the retrograde sheath and a 7 French sheath for the antegrade sheath and imaging was performed.  Hand injections were completed to image the access including the central venous system. This demonstrated thrombosis of the AVF.  Based on the images, this patient will need extensive treatment to salvage the graft. I then gave the patient 4000 units of intravenous heparin.  I then placed a versa core wire into the subclavian vein from the antegrade sheath. 4 mg of TPA were deployed. This was allowed to dwell. Mechanical thrombectomy was then performed using the Penumbra Cat 7D catheter throughout the fistula and into the axillary and subclavian vein. This uncovered about a 60 to 70% stenosis of the cephalic vein subclavian vein confluence.  A residual arterial plug was also seen at the arterial anastomosis. An attempt to clear the arterial plug was done with 3 passes of the Fogarty embolectomy balloon.This resulted in resolution of the  arterial plug, and clearance of the arterial side of the graft. The arterial outflow was seen to be intact as well on these images. The retrograde sheath was removed. I then  turned my attention to the thrombus in the fistula and the subclavian vein. Mechanical thrombectomy was performed using the Penumbra Cat 7D catheter. This resulted in resolution of the thrombus with only the residual stenosis in the cephalic vein subclavian vein confluence.  I then elected to treat this with a 7 mm diameter by 6 cm length Lutonix drug-coated angioplasty balloon inflated to 8 atm for 1 minute.  Completion imaging following this showed only about a 20-30% residual stenosis.    Based on the completion imaging, no further intervention is necessary.  The wire and balloon were removed from the sheath.  A 4-0 Monocryl purse-string suture was sewn around the sheath.  The sheath was removed while tying down the suture.  A sterile bandage was applied to the puncture site.  COMPLICATIONS: None  CONDITION: Stable   [name] 08/04/2022 2:56 PM   This note was created with Dragon Medical transcription system. Any errors in dictation are purely unintentional.

## 2022-08-04 NOTE — Progress Notes (Signed)
Patient clinically stable post procedure per DR Dew, tolerated well. Vitals stable, awake/alert and oriented eating sandwich. Discharge instructions given to patient as well as caregiver. Denies complaints at present time.

## 2022-08-04 NOTE — Progress Notes (Signed)
Upon preparing patient for discharge, noting oozing from left arm fistula, light pressure applied to stop oozing, notified Dr Lucky Cowboy, who is coming to place stitch prior to discharge. Vitals remain stable.

## 2022-08-04 NOTE — H&P (Signed)
[facility] Admission History & Physical  MRN : [id number]  Lawrence [name] is a 66 y.o. (1956-01-25) male who presents with chief complaint of No chief complaint on file. [name]  History of Present Illness: I am asked to evaluate the patient by the dialysis center. The patient was sent here because they were unable to cannulate the access this morning. Furthermore the Center states there is no thrill or bruit. The patient states this is the first dialysis run to be missed. This problem is acute in onset and has been present for approximately 3 days. The patient is unaware of any other change.   Patient denies pain or tenderness overlying the access.  There is no pain with dialysis.  The patient denies hand pain or finger pain consistent with steal syndrome.    There have many past interventions or declots of this access.  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  No current facility-administered medications for this encounter.    Past Medical History:  Diagnosis Date   Anemia    Cerebral hemorrhage ([place]) 2012   Chronic constipation    Chronic kidney disease    Diabetes mellitus without complication ([place])    Hemodialysis patient ([place])    Hyperlipidemia    Hypertension    Schizophrenia ([place])    Stroke [facility])     Past Surgical History:  Procedure Laterality Date   A/V FISTULAGRAM Left 02/24/2017   Procedure: A/V Fistulagram;  Surgeon: Katha Cabal, MD;  Location: [facility];  Service: Cardiovascular;  Laterality: Left;   A/V FISTULAGRAM Left 09/03/2020   Procedure: A/V FISTULAGRAM;  Surgeon: Algernon Huxley, MD;  Location: [place] [facility];  Service: Cardiovascular;  Laterality: Left;   A/V FISTULAGRAM Left 06/27/2021   Procedure: A/V FISTULAGRAM;  Surgeon: Algernon Huxley, MD;  Location: [place] [facility];  Service: Cardiovascular;  Laterality: Left;   A/V FISTULAGRAM Left 07/24/2021   Procedure: A/V FISTULAGRAM;  Surgeon: Algernon Huxley, MD;  Location: [place] [facility];  Service: Cardiovascular;  Laterality: Left;   A/V FISTULAGRAM Left 03/25/2022   Procedure: A/V Fistulagram;  Surgeon: Evaristo Bury, MD;  Location: [facility];  Service: Cardiovascular;  Laterality: Left;   A/V FISTULAGRAM N/A 06/12/2022   Procedure: A/V Fistulagram;  Surgeon: Katha Cabal, MD;  Location: [place] [facility];  Service: Cardiovascular;  Laterality: N/A;   DIALYSIS/PERMA CATHETER INSERTION N/A 12/03/2021   Procedure: DIALYSIS/PERMA CATHETER INSERTION;  Surgeon: Algernon Huxley, MD;  Location: [place] [facility];  Service: Cardiovascular;  Laterality: N/A;   DIALYSIS/PERMA CATHETER INSERTION N/A 03/27/2022   Procedure: DIALYSIS/PERMA CATHETER INSERTION;  Surgeon: Algernon Huxley, MD;  Location: [place] [facility];  Service: Cardiovascular;  Laterality: N/A;   DIALYSIS/PERMA CATHETER REMOVAL N/A 03/13/2022   Procedure: DIALYSIS/PERMA CATHETER REMOVAL;  Surgeon: Algernon Huxley, MD;  Location: [place] [facility];  Service: Cardiovascular;  Laterality: N/A;   PERIPHERAL VASCULAR CATHETERIZATION N/A 04/17/2015   Procedure: Dialysis/Perma Catheter Insertion;  Surgeon: Katha Cabal, MD;  Location: [place] [facility];  Service: Cardiovascular;  Laterality: N/A;   PERIPHERAL VASCULAR CATHETERIZATION Left 07/30/2015   Procedure: A/V Shuntogram/Fistulagram;  Surgeon: Algernon Huxley, MD;  Location: [place] [facility];  Service: Cardiovascular;  Laterality: Left;   PERIPHERAL VASCULAR CATHETERIZATION Left 07/30/2015   Procedure: A/V Shunt Intervention;  Surgeon: Algernon Huxley, MD;  Location: [place] [facility];  Service: Cardiovascular;  Laterality:  Left;   PERIPHERAL VASCULAR CATHETERIZATION N/A 08/20/2015   Procedure: DIALYSIS/PERMA CATHETER REMOVAL;  Surgeon: Algernon Huxley, MD;  Location: [place] [facility];  Service: Cardiovascular;  Laterality: N/A;   PERIPHERAL VASCULAR THROMBECTOMY Left 03/27/2022   Procedure: PERIPHERAL VASCULAR  THROMBECTOMY;  Surgeon: Algernon Huxley, MD;  Location: [place] [facility];  Service: Cardiovascular;  Laterality: Left;   REVISON OF ARTERIOVENOUS FISTULA Left 12/04/2021   Procedure: REVISON OF ARTERIOVENOUS FISTULA;  Surgeon: Algernon Huxley, MD;  Location: [facility];  Service: Vascular;  Laterality: Left;   TEMPORARY DIALYSIS CATHETER N/A 06/11/2022   Procedure: TEMPORARY DIALYSIS CATHETER;  Surgeon: Katha Cabal, MD;  Location: [place] [facility];  Service: Cardiovascular;  Laterality: N/A;   THROMBECTOMY W/ EMBOLECTOMY Left 12/04/2021   Procedure: THROMBECTOMY ARTERIOVENOUS FISTULA;  Surgeon: Algernon Huxley, MD;  Location: [facility];  Service: Vascular;  Laterality: Left;   VASCULAR SURGERY     Fistula placement    Social History   Tobacco Use   Smoking status: Never   Smokeless tobacco: Never  Substance Use Topics   Alcohol use: No   Drug use: No    Family History  Problem Relation Age of Onset   Diabetes Mother    Kidney cancer Mother    Diabetes Sister    Stroke Brother    Prostate cancer Neg Hx    Bladder Cancer Neg Hx     No family history of bleeding or clotting disorders, autoimmune disease or porphyria  Allergies  Allergen Reactions   Nsaids Other (See Comments)    Cannot take due to renal disease     REVIEW OF SYSTEMS (Negative unless checked)  Constitutional: '[]'$ Weight loss  '[]'$ Fever  '[]'$ Chills Cardiac: '[]'$ Chest pain   '[]'$ Chest pressure   '[]'$ Palpitations   '[]'$ Shortness of breath when laying flat   '[]'$ Shortness of breath at rest   '[x]'$ Shortness of breath with exertion. Vascular:  '[]'$ Pain in legs with walking   '[]'$ Pain in legs at rest   '[]'$ Pain in legs when laying flat   '[]'$ Claudication   '[]'$ Pain in feet when walking  '[]'$ Pain in feet at rest  '[]'$ Pain in feet when laying flat   '[]'$ History of DVT   '[]'$ Phlebitis   '[]'$ Swelling in legs   '[]'$ Varicose veins   '[]'$ Non-healing ulcers Pulmonary:   '[]'$ Uses home oxygen   '[]'$ Productive cough   '[]'$ Hemoptysis   '[]'$ Wheeze  '[]'$ COPD   '[]'$ Asthma Neurologic:   '[]'$ Dizziness  '[]'$ Blackouts   '[]'$ Seizures   '[]'$ History of stroke   '[]'$ History of TIA  '[]'$ Aphasia   '[]'$ Temporary blindness   '[]'$ Dysphagia   '[]'$ Weakness or numbness in arms   '[]'$ Weakness or numbness in legs Musculoskeletal:  '[]'$ Arthritis   '[]'$ Joint swelling   '[]'$ Joint pain   '[]'$ Low back pain Hematologic:  '[]'$ Easy bruising  '[]'$ Easy bleeding   '[]'$ Hypercoagulable state   '[x]'$ Anemic  '[]'$ Hepatitis Gastrointestinal:  '[]'$ Blood in stool   '[]'$ Vomiting blood  '[]'$ Gastroesophageal reflux/heartburn   '[]'$ Difficulty swallowing. Genitourinary:  '[x]'$ Chronic kidney disease   '[]'$ Difficult urination  '[]'$ Frequent urination  '[]'$ Burning with urination   '[]'$ Blood in urine Skin:  '[]'$ Rashes   '[]'$ Ulcers   '[]'$ Wounds Psychological:  '[]'$ History of anxiety   '[]'$  History of major depression.  Physical Examination  There were no vitals filed for this visit. There is no height or weight on file to calculate BMI. Gen: WD/WN, NAD Head: [place]/AT, No temporalis wasting. Prominent temp pulse not noted. Ear/Nose/Throat: Hearing grossly intact, nares w/o erythema or drainage, oropharynx w/o Erythema/Exudate,  Eyes: Conjunctiva clear,  sclera non-icteric Neck: Trachea midline.  No JVD.  Pulmonary:  Good air movement, respirations not labored, no use of accessory muscles.  Cardiac: RRR, normal S1, S2. Vascular: no thrill in access Vessel Right Left  Radial Palpable Palpable   Musculoskeletal: M/S 5/5 throughout.  Extremities without ischemic changes.  No deformity or atrophy.  Neurologic: Sensation grossly intact in extremities.  Symmetrical.  Speech is fluent. Motor exam as listed above. Psychiatric: Judgment intact, Mood & affect appropriate for pt's clinical situation. Dermatologic: No rashes or ulcers noted.  No cellulitis or open wounds.    CBC Lab Results  Component Value Date   WBC 4.0 06/13/2022   HGB 10.8 (L) 06/13/2022   HCT 33.4 (L) 06/13/2022   MCV 98.8 06/13/2022   PLT 64 (L) 06/13/2022    BMET    Component Value Date/Time   NA 133 (L)  08/01/2022 2129   NA 140 03/21/2015 1512   K 4.9 08/01/2022 2129   K 4.0 03/21/2015 1512   CL 94 (L) 08/01/2022 2129   CL 95 (L) 03/21/2015 1512   CO2 23 08/01/2022 2129   CO2 36 (H) 03/21/2015 1512   GLUCOSE 89 08/01/2022 2129   GLUCOSE 117 (H) 03/21/2015 1512   BUN 40 (H) 06/13/2022 0930   BUN 64 (H) 03/21/2015 1512   CREATININE 10.56 (H) 08/01/2022 2129   CREATININE 7.71 (H) 03/21/2015 1512   CALCIUM 8.8 (L) 08/01/2022 2129   CALCIUM 8.5 (L) 03/21/2015 1512   GFRNONAA 5 (L) 08/01/2022 2129   GFRNONAA 7 (L) 03/21/2015 1512   GFRAA 21 (L) 03/08/2020 1756   GFRAA 8 (L) 03/21/2015 1512   Estimated Creatinine Clearance: 6.4 mL/min (A) (by C-G formula based on SCr of 10.56 mg/dL (H)).  COAG Lab Results  Component Value Date   INR 1.1 06/10/2022   INR 1.1 12/13/2014    Radiology DG Chest 2 View  Result Date: 08/01/2022 CLINICAL DATA:  Fluid overload EXAM: CHEST - 2 VIEW COMPARISON:  10/14/2017 FINDINGS: The heart size and mediastinal contours are within normal limits. Both lungs are clear. The visualized skeletal structures are unremarkable. IMPRESSION: No active cardiopulmonary disease. Electronically Signed   By: Ulyses Jarred M.D.   On: 08/01/2022 20:05    Assessment/Plan 1.  Complication dialysis device with thrombosis AV access:  Patient's dialysis access is thrombosed. The patient will undergo thrombectomy using interventional techniques.  The risks and benefits were described to the patient.  All questions were answered.  The patient agrees to proceed with angiography and intervention. Potassium will be drawn to ensure that it is an appropriate level prior to performing thrombectomy. 2.  End-stage renal disease requiring hemodialysis:  Patient will continue dialysis therapy without further interruption if a successful thrombectomy is not achieved then catheter will be placed. Dialysis has already been arranged since the patient missed their previous session 3.  Hypertension:   Patient will continue medical management; nephrology is following no changes in oral medications. 4. Diabetes mellitus:  Glucose will be monitored and oral medications been held this morning once the patient has undergone the patient's procedure po intake will be reinitiated and again Accu-Cheks will be used to assess the blood glucose level and treat as needed. The patient will be restarted on the patient's usual hypoglycemic regime 5.  Coronary artery disease:  EKG will be monitored. Nitrates will be used if needed. The patient's oral cardiac medications will be continued.    [name], MD  08/04/2022 12:08 PM

## 2022-11-13 ENCOUNTER — Other Ambulatory Visit (INDEPENDENT_AMBULATORY_CARE_PROVIDER_SITE_OTHER): Payer: Self-pay | Admitting: Vascular Surgery

## 2022-11-13 DIAGNOSIS — N186 End stage renal disease: Secondary | ICD-10-CM

## 2022-11-14 ENCOUNTER — Encounter (INDEPENDENT_AMBULATORY_CARE_PROVIDER_SITE_OTHER): Payer: Self-pay

## 2022-11-14 ENCOUNTER — Encounter (INDEPENDENT_AMBULATORY_CARE_PROVIDER_SITE_OTHER): Payer: Medicare Other

## 2022-11-14 ENCOUNTER — Ambulatory Visit (INDEPENDENT_AMBULATORY_CARE_PROVIDER_SITE_OTHER): Payer: Medicare Other | Admitting: Nurse Practitioner

## 2022-12-02 ENCOUNTER — Other Ambulatory Visit: Payer: Self-pay | Admitting: *Deleted

## 2022-12-02 DIAGNOSIS — R972 Elevated prostate specific antigen [PSA]: Secondary | ICD-10-CM

## 2022-12-05 ENCOUNTER — Other Ambulatory Visit: Payer: Medicare Other

## 2022-12-10 ENCOUNTER — Ambulatory Visit: Payer: Medicare Other | Admitting: Urology

## 2022-12-18 ENCOUNTER — Other Ambulatory Visit: Payer: Medicare Other

## 2022-12-19 ENCOUNTER — Encounter: Payer: Self-pay | Admitting: Urology

## 2023-01-01 ENCOUNTER — Ambulatory Visit: Payer: Medicare Other | Admitting: Urology

## 2023-01-28 ENCOUNTER — Other Ambulatory Visit: Payer: Medicare Other

## 2023-02-05 ENCOUNTER — Encounter: Payer: Self-pay | Admitting: Urology

## 2023-02-05 ENCOUNTER — Ambulatory Visit (INDEPENDENT_AMBULATORY_CARE_PROVIDER_SITE_OTHER): Payer: Medicare Other | Admitting: Urology

## 2023-02-05 VITALS — BP 116/73 | HR 101 | Ht 67.0 in | Wt 160.2 lb

## 2023-02-05 DIAGNOSIS — R972 Elevated prostate specific antigen [PSA]: Secondary | ICD-10-CM | POA: Diagnosis not present

## 2023-02-05 NOTE — Progress Notes (Signed)
02/05/2023 10:28 AM   Lawrence [name] 03-Jan-1956 [id number]  Referring provider: Sharyne Peach, MD [place],  [place] [address]  Chief Complaint  Patient presents with   Elevated PSA    HPI: 67 y.o. male presents for follow-up of an elevated PSA.  Initially saw Dr. Erlene Quan 2017 for PSA of 3.2 which had increased from a prior PSA of 2.3.  DRE was benign and prn follow-up recommended Follow-up visit 04/2017 for PSA 7.91.  PSA was repeated and was 4.9 and continued monitoring recommended 11/2018 follow-up PSA was 6.09.  DRE was benign and continued monitoring was recommended He last saw Dr. Erlene Quan September 2020 and PSA was 14.8.  No change in DRE.  Noncontrast MRI was discussed and recommended however it does not look like this was ever scheduled PSA 08/28/2021 was 6.32 He has no complaints.  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 PSA 06/03/2022 stable 6.1  Interval history: No complaints since last visit Follow-up PSA has not been drawn    PMH: Past Medical History:  Diagnosis Date   Anemia    Cerebral hemorrhage ([place]) 2012   Chronic constipation    Chronic kidney disease    Diabetes mellitus without complication ([place])    Hemodialysis patient ([place])    Hyperlipidemia    Hypertension    Schizophrenia ([place])    Stroke [facility])     Surgical History: Past Surgical History:  Procedure Laterality Date   A/V FISTULAGRAM Left 02/24/2017   Procedure: A/V Fistulagram;  Surgeon: Katha Cabal, MD;  Location: [place] [facility];  Service: Cardiovascular;  Laterality: Left;   A/V FISTULAGRAM Left 09/03/2020   Procedure: A/V FISTULAGRAM;  Surgeon: Algernon Huxley, MD;  Location: [place] [facility];  Service: Cardiovascular;  Laterality: Left;   A/V FISTULAGRAM Left 06/27/2021   Procedure: A/V FISTULAGRAM;  Surgeon: Algernon Huxley, MD;  Location: [place] [facility];  Service: Cardiovascular;  Laterality: Left;   A/V FISTULAGRAM Left 07/24/2021   Procedure: A/V  FISTULAGRAM;  Surgeon: Algernon Huxley, MD;  Location: [place] [facility];  Service: Cardiovascular;  Laterality: Left;   A/V FISTULAGRAM Left 03/25/2022   Procedure: A/V Fistulagram;  Surgeon: Evaristo Bury, MD;  Location: [place] [facility];  Service: Cardiovascular;  Laterality: Left;   A/V FISTULAGRAM N/A 06/12/2022   Procedure: A/V Fistulagram;  Surgeon: Katha Cabal, MD;  Location: [place] [facility];  Service: Cardiovascular;  Laterality: N/A;   DIALYSIS/PERMA CATHETER INSERTION N/A 12/03/2021   Procedure: DIALYSIS/PERMA CATHETER INSERTION;  Surgeon: Algernon Huxley, MD;  Location: [place] [facility];  Service: Cardiovascular;  Laterality: N/A;   DIALYSIS/PERMA CATHETER INSERTION N/A 03/27/2022   Procedure: DIALYSIS/PERMA CATHETER INSERTION;  Surgeon: Algernon Huxley, MD;  Location: [place] [facility];  Service: Cardiovascular;  Laterality: N/A;   DIALYSIS/PERMA CATHETER REMOVAL N/A 03/13/2022   Procedure: DIALYSIS/PERMA CATHETER REMOVAL;  Surgeon: Algernon Huxley, MD;  Location: [place] [facility];  Service: Cardiovascular;  Laterality: N/A;   PERIPHERAL VASCULAR CATHETERIZATION N/A 04/17/2015   Procedure: Dialysis/Perma Catheter Insertion;  Surgeon: Katha Cabal, MD;  Location: [place] [facility];  Service: Cardiovascular;  Laterality: N/A;   PERIPHERAL VASCULAR CATHETERIZATION Left 07/30/2015   Procedure: A/V Shuntogram/Fistulagram;  Surgeon: Algernon Huxley, MD;  Location: [place] [facility];  Service: Cardiovascular;  Laterality: Left;   PERIPHERAL VASCULAR CATHETERIZATION Left 07/30/2015   Procedure: A/V Shunt Intervention;  Surgeon: Algernon Huxley, MD;  Location:  [place] [facility];  Service: Cardiovascular;  Laterality: Left;   PERIPHERAL VASCULAR CATHETERIZATION N/A 08/20/2015   Procedure: DIALYSIS/PERMA CATHETER REMOVAL;  Surgeon: Algernon Huxley, MD;  Location: [place] [facility];  Service: Cardiovascular;  Laterality: N/A;   PERIPHERAL VASCULAR THROMBECTOMY Left 03/27/2022    Procedure: PERIPHERAL VASCULAR THROMBECTOMY;  Surgeon: Algernon Huxley, MD;  Location: [place] [facility];  Service: Cardiovascular;  Laterality: Left;   PERIPHERAL VASCULAR THROMBECTOMY Left 08/04/2022   Procedure: PERIPHERAL VASCULAR THROMBECTOMY;  Surgeon: Algernon Huxley, MD;  Location: [place] [facility];  Service: Cardiovascular;  Laterality: Left;   REVISON OF ARTERIOVENOUS FISTULA Left 12/04/2021   Procedure: REVISON OF ARTERIOVENOUS FISTULA;  Surgeon: Algernon Huxley, MD;  Location: [facility];  Service: Vascular;  Laterality: Left;   TEMPORARY DIALYSIS CATHETER N/A 06/11/2022   Procedure: TEMPORARY DIALYSIS CATHETER;  Surgeon: Katha Cabal, MD;  Location: [place] [facility];  Service: Cardiovascular;  Laterality: N/A;   THROMBECTOMY W/ EMBOLECTOMY Left 12/04/2021   Procedure: THROMBECTOMY ARTERIOVENOUS FISTULA;  Surgeon: Algernon Huxley, MD;  Location: [facility];  Service: Vascular;  Laterality: Left;   VASCULAR SURGERY     Fistula placement    Home Medications:  Allergies as of 02/05/2023       Reactions   Nsaids Other (See Comments)   Cannot take due to renal disease        Medication List        Accurate as of February 05, 2023 10:28 AM. If you have any questions, ask your nurse or doctor.          acetaminophen 500 MG tablet Commonly known as: TYLENOL Take 500 mg by mouth every 4 (four) hours as needed for mild pain.   amLODipine 10 MG tablet Commonly known as: NORVASC Take 10 mg by mouth daily.   aspirin EC 81 MG tablet Take 81 mg by mouth daily.   benztropine 0.5 MG tablet Commonly known as: COGENTIN Take 0.5 mg by mouth 2 (two) times daily.   carvedilol 25 MG tablet Commonly known as: COREG Take 25 mg by mouth 2 (two) times daily with a meal.   cetirizine 10 MG tablet Commonly known as: ZYRTEC Take 10 mg by mouth every other day.   Coenzyme Q10 100 MG capsule Take 100 mg by mouth at bedtime.   diphenhydrAMINE 25 mg capsule Commonly known as:  BENADRYL Take 25 mg by mouth See admin instructions. Take 1 capsule ('25mg'$ ) by mouth nightly as needed for sleep - may take 1 capsule ('25mg'$ ) by mouth during the daytime as needed for itching   DIPHENHYDRAMINE-ZINC OXIDE EX Apply 1 application. topically See admin instructions. Apply small amount of affected area three times daily as needed   docusate sodium 100 MG capsule Commonly known as: COLACE Take 100 mg by mouth 2 (two) times daily.   docusate sodium 100 MG capsule Commonly known as: COLACE Take 200 mg by mouth daily as needed for mild constipation or moderate constipation.   EasyMax Test test strip Generic drug: glucose blood EasyMax   feeding supplement Liqd Take 1 Container by mouth See admin instructions. Take 1 container by mouth three times a week or as needed of Nutritional Supplement   fluticasone 50 MCG/ACT nasal spray Commonly known as: FLONASE Place 2 sprays into both nostrils daily.   glipiZIDE 2.5 MG 24 hr tablet Commonly known as: GLUCOTROL XL Take 2.5 mg by mouth daily with breakfast.   haloperidol 10 MG tablet  Commonly known as: HALDOL Take 5-10 mg by mouth See admin instructions. Take 5 mg in the morning and 10 mg at bedtime   lidocaine-prilocaine cream Commonly known as: EMLA Apply 1 application topically 3 (three) times a week. Apply over dialysis shunt 30 minutes prior to dialysis.   midodrine 5 MG tablet Commonly known as: PROAMATINE Take 5 mg by mouth 3 (three) times a week. (Take 1 hour prior to dialysis)   mirtazapine 15 MG tablet Commonly known as: REMERON Take 15 mg by mouth at bedtime.   Multi-Vitamin tablet Take 1 tablet by mouth daily.   niacin 1000 MG CR tablet Commonly known as: NIASPAN Take 1,000 mg by mouth at bedtime.   polyethylene glycol 17 g packet Commonly known as: MIRALAX / GLYCOLAX Take 17 g by mouth daily as needed (Constipation). Mix 17 g (1 capful) in to 8 ounces of Juice/Water   pravastatin 40 MG  tablet Commonly known as: PRAVACHOL Take 40 mg by mouth at bedtime.   SENSIPAR PO Take by mouth.   sevelamer carbonate 800 MG tablet Commonly known as: RENVELA Take 2 tablets (1,600 mg total) by mouth 3 (three) times daily with meals.   Vitamin D3 125 MCG (5000 UT) capsule Generic drug: Cholecalciferol Take 5,000 Units by mouth once a week.        Allergies:  Allergies  Allergen Reactions   Nsaids Other (See Comments)    Cannot take due to renal disease    Family History: Family History  Problem Relation Age of Onset   Diabetes Mother    Kidney cancer Mother    Diabetes Sister    Stroke Brother    Prostate cancer Neg Hx    Bladder Cancer Neg Hx     Social History:  reports that he has never smoked. He has never used smokeless tobacco. He reports that he does not drink alcohol and does not use drugs.   Physical Exam: BP 116/73 (BP Location: Left Arm, Patient Position: Sitting, Cuff Size: Normal)   Pulse (!) 101   Ht '5\' 7"'$  (1.702 m)   Wt 160 lb 3.2 oz (72.7 kg)   BMI 25.09 kg/m   Constitutional:  Alert, No acute distress. HEENT: [employer] AT GU: Prostate 50 g, smooth.  Negative nodules or induration Psychiatric: Normal mood and affect.   Assessment & Plan:    1.  Elevated PSA Benign DRE He desires to continue surveillance PSA drawn today 6 month follow-up with [place], MD  [facility] [address], [place], [place] [address] [phone]

## 2023-02-06 ENCOUNTER — Telehealth: Payer: Self-pay | Admitting: Family Medicine

## 2023-02-06 LAB — PSA: Prostate Specific Ag, Serum: 5.8 ng/mL — ABNORMAL HIGH (ref 0.0–4.0)

## 2023-02-06 NOTE — Telephone Encounter (Signed)
Patient notified and voiced understanding.

## 2023-02-06 NOTE — Telephone Encounter (Signed)
-----   Message from Abbie Sons, MD sent at 02/06/2023 11:56 AM EST ----- PSA stable at 5.8.  Keep scheduled follow-up August 2024

## 2023-06-16 ENCOUNTER — Ambulatory Visit (INDEPENDENT_AMBULATORY_CARE_PROVIDER_SITE_OTHER): Payer: Medicare Other

## 2023-06-16 DIAGNOSIS — N186 End stage renal disease: Secondary | ICD-10-CM | POA: Diagnosis not present

## 2023-06-23 ENCOUNTER — Ambulatory Visit (INDEPENDENT_AMBULATORY_CARE_PROVIDER_SITE_OTHER): Payer: Medicare Other | Admitting: Vascular Surgery

## 2023-06-23 VITALS — BP 88/53 | HR 118 | Resp 18 | Ht 62.0 in | Wt 157.2 lb

## 2023-06-23 DIAGNOSIS — N186 End stage renal disease: Secondary | ICD-10-CM

## 2023-06-23 DIAGNOSIS — E785 Hyperlipidemia, unspecified: Secondary | ICD-10-CM

## 2023-06-23 DIAGNOSIS — I10 Essential (primary) hypertension: Secondary | ICD-10-CM | POA: Diagnosis not present

## 2023-06-23 DIAGNOSIS — Z992 Dependence on renal dialysis: Secondary | ICD-10-CM

## 2023-06-23 DIAGNOSIS — E1169 Type 2 diabetes mellitus with other specified complication: Secondary | ICD-10-CM

## 2023-06-23 NOTE — Assessment & Plan Note (Signed)
An underlying cause of ESRD and blood glucose control important in reducing the progression of atherosclerotic disease. Also, involved in wound healing. On appropriate medications.

## 2023-06-23 NOTE — Progress Notes (Signed)
MRN : [id number]  Lawrence [name] is a 67 y.o. (1956-02-20) male who presents with chief complaint of  Chief Complaint  Patient presents with   Venous Insufficiency  .  History of Present Illness: Patient returns today in follow up of his dialysis access.  We did a declot on this about a year ago.  It sounds like he has had another intervention at an outside center earlier this year.  Prior to that, they were having prolonged bleeding frequently.  He says that is very occasional now.  No significant pain.  No difficulty with cannulation.  Duplex recently performed demonstrates Mildly elevated velocities at the confluence level.  Mild dilatation is present as well.  Current Outpatient Medications  Medication Sig Dispense Refill   acetaminophen (TYLENOL) 500 MG tablet Take 500 mg by mouth every 4 (four) hours as needed for mild pain.     amLODipine (NORVASC) 10 MG tablet Take 10 mg by mouth daily.     aspirin EC 81 MG tablet Take 81 mg by mouth daily.      benztropine (COGENTIN) 0.5 MG tablet Take 0.5 mg by mouth 2 (two) times daily.     carvedilol (COREG) 25 MG tablet Take 25 mg by mouth 2 (two) times daily with a meal.     cetirizine (ZYRTEC) 10 MG tablet Take 10 mg by mouth every other day.     Cholecalciferol (VITAMIN D3) 125 MCG (5000 UT) CAPS Take 5,000 Units by mouth once a week.     Cinacalcet HCl (SENSIPAR PO) Take by mouth.     Coenzyme Q10 100 MG capsule Take 100 mg by mouth at bedtime.     diphenhydrAMINE (BENADRYL) 25 mg capsule Take 25 mg by mouth See admin instructions. Take 1 capsule (25mg ) by mouth nightly as needed for sleep - may take 1 capsule (25mg ) by mouth during the daytime as needed for itching     DIPHENHYDRAMINE-ZINC OXIDE EX Apply 1 application. topically See admin instructions. Apply small amount of affected area three times daily as needed     docusate sodium (COLACE) 100 MG capsule Take 100 mg by mouth 2 (two) times daily.     docusate sodium (COLACE) 100 MG  capsule Take 200 mg by mouth daily as needed for mild constipation or moderate constipation.     EASYMAX TEST test strip EasyMax     feeding supplement (BOOST HIGH PROTEIN) LIQD Take 1 Container by mouth See admin instructions. Take 1 container by mouth three times a week or as needed of Nutritional Supplement     fluticasone (FLONASE) 50 MCG/ACT nasal spray Place 2 sprays into both nostrils daily.     glipiZIDE (GLUCOTROL XL) 2.5 MG 24 hr tablet Take 2.5 mg by mouth daily with breakfast.      haloperidol (HALDOL) 10 MG tablet Take 5-10 mg by mouth See admin instructions. Take 5 mg in the morning and 10 mg at bedtime     lidocaine-prilocaine (EMLA) cream Apply 1 application topically 3 (three) times a week. Apply over dialysis shunt 30 minutes prior to dialysis.     midodrine (PROAMATINE) 5 MG tablet Take 5 mg by mouth 3 (three) times a week. (Take 1 hour prior to dialysis)     mirtazapine (REMERON) 15 MG tablet Take 15 mg by mouth at bedtime.     Multiple Vitamin (MULTI-VITAMIN) tablet Take 1 tablet by mouth daily.     niacin (NIASPAN) 1000 MG CR tablet Take 1,000 mg by  mouth at bedtime.      polyethylene glycol (MIRALAX / GLYCOLAX) 17 g packet Take 17 g by mouth daily as needed (Constipation). Mix 17 g (1 capful) in to 8 ounces of Juice/Water     pravastatin (PRAVACHOL) 40 MG tablet Take 40 mg by mouth at bedtime.      sevelamer carbonate (RENVELA) 800 MG tablet Take 2 tablets (1,600 mg total) by mouth 3 (three) times daily with meals. 180 tablet 0   No current facility-administered medications for this visit.    Past Medical History:  Diagnosis Date   Anemia    Cerebral hemorrhage (HCC) 2012   Chronic constipation    Chronic kidney disease    Diabetes mellitus without complication (HCC)    Hemodialysis patient (HCC)    Hyperlipidemia    Hypertension    Schizophrenia (HCC)    Stroke [facility])     Past Surgical History:  Procedure Laterality Date   A/V FISTULAGRAM Left 02/24/2017    Procedure: A/V Fistulagram;  Surgeon: Renford Dills, MD;  Location: [facility];  Service: Cardiovascular;  Laterality: Left;   A/V FISTULAGRAM Left 09/03/2020   Procedure: A/V FISTULAGRAM;  Surgeon: Annice Needy, MD;  Location: [facility];  Service: Cardiovascular;  Laterality: Left;   A/V FISTULAGRAM Left 06/27/2021   Procedure: A/V FISTULAGRAM;  Surgeon: Annice Needy, MD;  Location: [facility];  Service: Cardiovascular;  Laterality: Left;   A/V FISTULAGRAM Left 07/24/2021   Procedure: A/V FISTULAGRAM;  Surgeon: Annice Needy, MD;  Location: [facility];  Service: Cardiovascular;  Laterality: Left;   A/V FISTULAGRAM Left 03/25/2022   Procedure: A/V Fistulagram;  Surgeon: Bertram Denver, MD;  Location: [facility];  Service: Cardiovascular;  Laterality: Left;   A/V FISTULAGRAM N/A 06/12/2022   Procedure: A/V Fistulagram;  Surgeon: Renford Dills, MD;  Location: [facility];  Service: Cardiovascular;  Laterality: N/A;   DIALYSIS/PERMA CATHETER INSERTION N/A 12/03/2021   Procedure: DIALYSIS/PERMA CATHETER INSERTION;  Surgeon: Annice Needy, MD;  Location: [facility];  Service: Cardiovascular;  Laterality: N/A;   DIALYSIS/PERMA CATHETER INSERTION N/A 03/27/2022   Procedure: DIALYSIS/PERMA CATHETER INSERTION;  Surgeon: Annice Needy, MD;  Location: [facility];  Service: Cardiovascular;  Laterality: N/A;   DIALYSIS/PERMA CATHETER REMOVAL N/A 03/13/2022   Procedure: DIALYSIS/PERMA CATHETER REMOVAL;  Surgeon: Annice Needy, MD;  Location: [facility];  Service: Cardiovascular;  Laterality: N/A;   PERIPHERAL VASCULAR CATHETERIZATION N/A 04/17/2015   Procedure: Dialysis/Perma Catheter Insertion;  Surgeon: Renford Dills, MD;  Location: [facility];  Service: Cardiovascular;  Laterality: N/A;   PERIPHERAL VASCULAR CATHETERIZATION Left 07/30/2015   Procedure: A/V Shuntogram/Fistulagram;  Surgeon: Annice Needy, MD;   Location: [facility];  Service: Cardiovascular;  Laterality: Left;   PERIPHERAL VASCULAR CATHETERIZATION Left 07/30/2015   Procedure: A/V Shunt Intervention;  Surgeon: Annice Needy, MD;  Location: [facility];  Service: Cardiovascular;  Laterality: Left;   PERIPHERAL VASCULAR CATHETERIZATION N/A 08/20/2015   Procedure: DIALYSIS/PERMA CATHETER REMOVAL;  Surgeon: Annice Needy, MD;  Location: [facility];  Service: Cardiovascular;  Laterality: N/A;   PERIPHERAL VASCULAR THROMBECTOMY Left 03/27/2022   Procedure: PERIPHERAL VASCULAR THROMBECTOMY;  Surgeon: Annice Needy, MD;  Location: [facility];  Service: Cardiovascular;  Laterality: Left;   PERIPHERAL VASCULAR THROMBECTOMY Left 08/04/2022   Procedure: PERIPHERAL VASCULAR THROMBECTOMY;  Surgeon: Wyn Quaker,  Marlow Baars, MD;  Location: [facility];  Service: Cardiovascular;  Laterality: Left;   REVISON OF ARTERIOVENOUS FISTULA Left 12/04/2021   Procedure: REVISON OF ARTERIOVENOUS FISTULA;  Surgeon: Annice Needy, MD;  Location: [facility];  Service: Vascular;  Laterality: Left;   TEMPORARY DIALYSIS CATHETER N/A 06/11/2022   Procedure: TEMPORARY DIALYSIS CATHETER;  Surgeon: Renford Dills, MD;  Location: [facility];  Service: Cardiovascular;  Laterality: N/A;   THROMBECTOMY W/ EMBOLECTOMY Left 12/04/2021   Procedure: THROMBECTOMY ARTERIOVENOUS FISTULA;  Surgeon: Annice Needy, MD;  Location: [facility];  Service: Vascular;  Laterality: Left;   VASCULAR SURGERY     Fistula placement     Social History   Tobacco Use   Smoking status: Never   Smokeless tobacco: Never  Substance Use Topics   Alcohol use: No   Drug use: No       Family History  Problem Relation Age of Onset   Diabetes Mother    Kidney cancer Mother    Diabetes Sister    Stroke Brother    Prostate cancer Neg Hx    Bladder Cancer Neg Hx      Allergies  Allergen Reactions   Nsaids Other (See Comments)    Cannot take due to renal  disease     REVIEW OF SYSTEMS (Negative unless checked)  Constitutional: [] Weight loss  [] Fever  [] Chills Cardiac: [] Chest pain   [] Chest pressure   [] Palpitations   [] Shortness of breath when laying flat   [] Shortness of breath at rest   [x] Shortness of breath with exertion. Vascular:  [] Pain in legs with walking   [] Pain in legs at rest   [] Pain in legs when laying flat   [] Claudication   [] Pain in feet when walking  [] Pain in feet at rest  [] Pain in feet when laying flat   [] History of DVT   [] Phlebitis   [] Swelling in legs   [] Varicose veins   [] Non-healing ulcers Pulmonary:   [] Uses home oxygen   [] Productive cough   [] Hemoptysis   [] Wheeze  [] COPD   [] Asthma Neurologic:  [] Dizziness  [] Blackouts   [] Seizures   [] History of stroke   [] History of TIA  [] Aphasia   [] Temporary blindness   [] Dysphagia   [] Weakness or numbness in arms   [] Weakness or numbness in legs Musculoskeletal:  [] Arthritis   [] Joint swelling   [x] Joint pain   [] Low back pain Hematologic:  [] Easy bruising  [] Easy bleeding   [] Hypercoagulable state   [x] Anemic   Gastrointestinal:  [] Blood in stool   [] Vomiting blood  [x] Gastroesophageal reflux/heartburn   [] Abdominal pain Genitourinary:  [x] Chronic kidney disease   [] Difficult urination  [] Frequent urination  [] Burning with urination   [] Hematuria Skin:  [] Rashes   [] Ulcers   [] Wounds Psychological:  [x] History of anxiety   [x]  History of major depression.  Physical Examination  BP (!) 88/53 (BP Location: Right Arm)   Pulse (!) 118   Resp 18   Ht 5\' 2"  (1.575 m)   Wt 157 lb 3.2 oz (71.3 kg)   BMI 28.75 kg/m  Gen:  WD/WN, NAD Head: [place]/AT, No temporalis wasting. Ear/Nose/Throat: Hearing grossly intact, nares w/o erythema or drainage Eyes: Conjunctiva clear. Sclera non-icteric Neck: Supple.  Trachea midline Pulmonary:  Good air movement, no use of accessory muscles.  Cardiac: RRR, no JVD Vascular: good thrill in left upper arm AVF and jump graft. Vessel Right  Left  Radial Palpable Palpable  Musculoskeletal: M/S 5/5 throughout.  No deformity or atrophy. Trace LE edema. Neurologic: Sensation grossly intact in extremities.  Symmetrical.  Speech is fluent.  Psychiatric: Judgment intact, Mood & affect appropriate for pt's clinical situation. Dermatologic: No rashes or ulcers noted.  No cellulitis or open wounds.      Labs No results found for this or any previous visit (from the past 2160 hour(s)).  Radiology VAS US DUPLEX DIALYSIS ACCESS (AVF, AVG)  Result Date: 06/17/2023 DIALYSIS ACCESS Patient Name:  JONH MCQUEARY  Date of Exam:   06/16/2023 Medical Rec #: [id number]         Accession #:    [id number] Date of Birth: 1956/05/22         Patient Gender: M Patient Age:   87 years [facility]am [facility] Procedure:      VAS US DUPLEX DIALYSIS ACCESS (AVF, AVG) Referring Phys: Festus Barren --------------------------------------------------------------------------------  Reason for Exam: Prolonged bleeding. Access Site: Left Upper Extremity. Access Type: Brachial-cephalic AVF. History: 03/29/15: Left brachial-cephalic AVF created;          02/24/18: PTA of mid AVF;          09/03/2020: PTA of confluence          06/27/2021: PTA of confluence          12/04/2021: Jump graft revision of left brachial-cephalic AVF with          thrombectomy and ligation of aneurysmal AVF;          03/25/2022: Left graft thrombectomy/PTA;          06/12/2022: Left AVF thrombectomy/PTA;          06/12/2023: Left AVF thrombectomy/angioplasty at [facility];. Performing Technologist: Jamse Mead RT, RDMS, RVT  Examination Guidelines: A complete evaluation includes B-mode imaging, spectral Doppler, color Doppler, and power Doppler as needed of all accessible portions of each vessel. Unilateral testing is considered an integral part of a complete examination. Limited examinations for reoccurring indications may be performed as noted.  Findings:  +--------------------+----------+-----------------+--------+ AVF                 PSV (cm/s)Flow Vol (mL/min)Comments +--------------------+----------+-----------------+--------+ Native artery inflow    79          1804                +--------------------+----------+-----------------+--------+ AVF Anastomosis        218                              +--------------------+----------+-----------------+--------+  +---------------+----------+-------------+----------+--------------------------+ OUTFLOW VEIN   PSV (cm/s)Diameter (cm)Depth (cm)         Describe          +---------------+----------+-------------+----------+--------------------------+ Subclavian vein   240                                                      +---------------+----------+-------------+----------+--------------------------+ Confluence        349                               tortuous/change in  diameter          +---------------+----------+-------------+----------+--------------------------+ Clavicle          171                                    tortuous          +---------------+----------+-------------+----------+--------------------------+ Shoulder          100                                                      +---------------+----------+-------------+----------+--------------------------+ Prox UA           113                                                      +---------------+----------+-------------+----------+--------------------------+ Mid UA                                             not evaluated due to                                                     bandaging from same day                                                            dialysis          +---------------+----------+-------------+----------+--------------------------+ Dist UA           158                                                       +---------------+----------+-------------+----------+--------------------------+ [facility]          257                              partially-occlusive     +---------------+----------+-------------+----------+--------------------------+ Biphasic, antegrade flow in the left distal radial artery.  Summary: Patent left upper arm access with normal Flow Volume. Mildly elevated velocity at the confluence level. Evidence of dilatated area, with no internal flow, just distal to the current anastomosis which appears to correlates with previously ligated AVF.  *See table(s) above for measurements and observations.  Diagnosing physician: Festus Barren MD Electronically signed by Festus Barren MD on 06/17/2023 at 7:28:17 AM.   --------------------------------------------------------------------------------   Final     Assessment/Plan  ESRD on dialysis [facility])  Duplex recently performed demonstrates Mildly elevated velocities at the confluence level.  Mild dilatation is present as well.  Fistula is currently working well and no intervention will be planned for him.  Follow-up in 6 months with duplex.  Type 2 diabetes mellitus with hyperlipidemia (HCC) An underlying cause of ESRD and blood glucose control important in reducing the progression of atherosclerotic disease. Also, involved in wound healing. On appropriate medications.   Essential hypertension An underlying cause of ESRD and blood pressure control important in reducing the progression of atherosclerotic disease. On appropriate oral medications.    Festus Barren, MD  06/23/2023 1:20 PM    This note was created with Dragon medical transcription system.  Any errors from dictation are purely unintentional

## 2023-06-23 NOTE — Assessment & Plan Note (Signed)
An underlying cause of ESRD and blood pressure control important in reducing the progression of atherosclerotic disease. On appropriate oral medications.

## 2023-06-23 NOTE — Assessment & Plan Note (Signed)
Duplex recently performed demonstrates Mildly elevated velocities at the confluence level.  Mild dilatation is present as well.  Fistula is currently working well and no intervention will be planned for him.  Follow-up in 6 months with duplex.

## 2023-07-23 ENCOUNTER — Other Ambulatory Visit: Payer: Medicare Other

## 2023-07-23 DIAGNOSIS — R972 Elevated prostate specific antigen [PSA]: Secondary | ICD-10-CM

## 2023-07-24 LAB — PSA: Prostate Specific Ag, Serum: 5.8 ng/mL — ABNORMAL HIGH (ref 0.0–4.0)

## 2023-07-31 ENCOUNTER — Other Ambulatory Visit: Payer: Medicare Other

## 2023-08-06 ENCOUNTER — Ambulatory Visit (INDEPENDENT_AMBULATORY_CARE_PROVIDER_SITE_OTHER): Payer: Medicare Other | Admitting: Urology

## 2023-08-06 ENCOUNTER — Encounter: Payer: Self-pay | Admitting: Urology

## 2023-08-06 VITALS — BP 96/67 | HR 102 | Ht 62.0 in | Wt 154.5 lb

## 2023-08-06 DIAGNOSIS — R972 Elevated prostate specific antigen [PSA]: Secondary | ICD-10-CM

## 2023-08-06 NOTE — Progress Notes (Signed)
I, Duke Salvia, acting as a Neurosurgeon for Riki Altes, MD., have documented all relevant documentation on the behalf of Riki Altes, MD, as directed by  Riki Altes, MD while in the presence of Riki Altes, MD.  08/06/2023 3:07 PM   Lawrence [name] 1956/10/12 [id number]  Referring provider: [name], MD [address] [place],  [place] [address]  Chief Complaint  Patient presents with   Elevated PSA    HPI: 67 y.o. male presents for 6 month follow-up of an elevated PSA.  Doing well since last visit No bothersome LUTS Denies dysuria, gross hematuria Denies flank, abdominal or pelvic pain   Interval history: No complaints since last visit PSA 07/23/23 stable at 5.8.    PMH: Past Medical History:  Diagnosis Date   Anemia    Cerebral hemorrhage (HCC) 2012   Chronic constipation    Chronic kidney disease    Diabetes mellitus without complication (HCC)    Hemodialysis patient (HCC)    Hyperlipidemia    Hypertension    Schizophrenia (HCC)    Stroke [facility])     Surgical History: Past Surgical History:  Procedure Laterality Date   A/V FISTULAGRAM Left 02/24/2017   Procedure: A/V Fistulagram;  Surgeon: Renford Dills, MD;  Location: [facility];  Service: Cardiovascular;  Laterality: Left;   A/V FISTULAGRAM Left 09/03/2020   Procedure: A/V FISTULAGRAM;  Surgeon: Annice Needy, MD;  Location: [facility];  Service: Cardiovascular;  Laterality: Left;   A/V FISTULAGRAM Left 06/27/2021   Procedure: A/V FISTULAGRAM;  Surgeon: Annice Needy, MD;  Location: [facility];  Service: Cardiovascular;  Laterality: Left;   A/V FISTULAGRAM Left 07/24/2021   Procedure: A/V FISTULAGRAM;  Surgeon: Annice Needy, MD;  Location: [facility];  Service: Cardiovascular;  Laterality: Left;   A/V FISTULAGRAM Left 03/25/2022   Procedure: A/V Fistulagram;  Surgeon: Bertram Denver, MD;  Location: [facility];  Service:  Cardiovascular;  Laterality: Left;   A/V FISTULAGRAM N/A 06/12/2022   Procedure: A/V Fistulagram;  Surgeon: Renford Dills, MD;  Location: [facility];  Service: Cardiovascular;  Laterality: N/A;   DIALYSIS/PERMA CATHETER INSERTION N/A 12/03/2021   Procedure: DIALYSIS/PERMA CATHETER INSERTION;  Surgeon: Annice Needy, MD;  Location: [facility];  Service: Cardiovascular;  Laterality: N/A;   DIALYSIS/PERMA CATHETER INSERTION N/A 03/27/2022   Procedure: DIALYSIS/PERMA CATHETER INSERTION;  Surgeon: Annice Needy, MD;  Location: [facility];  Service: Cardiovascular;  Laterality: N/A;   DIALYSIS/PERMA CATHETER REMOVAL N/A 03/13/2022   Procedure: DIALYSIS/PERMA CATHETER REMOVAL;  Surgeon: Annice Needy, MD;  Location: [facility];  Service: Cardiovascular;  Laterality: N/A;   PERIPHERAL VASCULAR CATHETERIZATION N/A 04/17/2015   Procedure: Dialysis/Perma Catheter Insertion;  Surgeon: Renford Dills, MD;  Location: [facility];  Service: Cardiovascular;  Laterality: N/A;   PERIPHERAL VASCULAR CATHETERIZATION Left 07/30/2015   Procedure: A/V Shuntogram/Fistulagram;  Surgeon: Annice Needy, MD;  Location: [facility];  Service: Cardiovascular;  Laterality: Left;   PERIPHERAL VASCULAR CATHETERIZATION Left 07/30/2015   Procedure: A/V Shunt Intervention;  Surgeon: Annice Needy, MD;  Location: [facility];  Service: Cardiovascular;  Laterality: Left;   PERIPHERAL VASCULAR CATHETERIZATION N/A 08/20/2015   Procedure: DIALYSIS/PERMA CATHETER REMOVAL;  Surgeon: Annice Needy, MD;  Location: [facility];  Service: Cardiovascular;  Laterality: N/A;   PERIPHERAL VASCULAR  THROMBECTOMY Left 03/27/2022   Procedure: PERIPHERAL VASCULAR THROMBECTOMY;  Surgeon: Annice Needy, MD;  Location: [facility];  Service: Cardiovascular;  Laterality: Left;   PERIPHERAL VASCULAR THROMBECTOMY Left 08/04/2022   Procedure: PERIPHERAL VASCULAR THROMBECTOMY;  Surgeon:  Annice Needy, MD;  Location: [facility];  Service: Cardiovascular;  Laterality: Left;   REVISON OF ARTERIOVENOUS FISTULA Left 12/04/2021   Procedure: REVISON OF ARTERIOVENOUS FISTULA;  Surgeon: Annice Needy, MD;  Location: [facility];  Service: Vascular;  Laterality: Left;   TEMPORARY DIALYSIS CATHETER N/A 06/11/2022   Procedure: TEMPORARY DIALYSIS CATHETER;  Surgeon: Renford Dills, MD;  Location: [facility];  Service: Cardiovascular;  Laterality: N/A;   THROMBECTOMY W/ EMBOLECTOMY Left 12/04/2021   Procedure: THROMBECTOMY ARTERIOVENOUS FISTULA;  Surgeon: Annice Needy, MD;  Location: [facility];  Service: Vascular;  Laterality: Left;   VASCULAR SURGERY     Fistula placement    Home Medications:  Allergies as of 08/06/2023       Reactions   Nsaids Other (See Comments)   Cannot take due to renal disease        Medication List        Accurate as of August 06, 2023  3:07 PM. If you have any questions, ask your nurse or doctor.          acetaminophen 500 MG tablet Commonly known as: TYLENOL Take 500 mg by mouth every 4 (four) hours as needed for mild pain.   amLODipine 10 MG tablet Commonly known as: NORVASC Take 10 mg by mouth daily.   aspirin EC 81 MG tablet Take 81 mg by mouth daily.   benztropine 0.5 MG tablet Commonly known as: COGENTIN Take 0.5 mg by mouth 2 (two) times daily.   carvedilol 25 MG tablet Commonly known as: COREG Take 25 mg by mouth 2 (two) times daily with a meal.   cetirizine 10 MG tablet Commonly known as: ZYRTEC Take 10 mg by mouth every other day.   Coenzyme Q10 100 MG capsule Take 100 mg by mouth at bedtime.   diphenhydrAMINE 25 mg capsule Commonly known as: BENADRYL Take 25 mg by mouth See admin instructions. Take 1 capsule (25mg ) by mouth nightly as needed for sleep - may take 1 capsule (25mg ) by mouth during the daytime as needed for itching   DIPHENHYDRAMINE-ZINC OXIDE EX Apply 1 application. topically See admin  instructions. Apply small amount of affected area three times daily as needed   docusate sodium 100 MG capsule Commonly known as: COLACE Take 100 mg by mouth 2 (two) times daily.   docusate sodium 100 MG capsule Commonly known as: COLACE Take 200 mg by mouth daily as needed for mild constipation or moderate constipation.   EasyMax Test test strip Generic drug: glucose blood EasyMax   feeding supplement Liqd Take 1 Container by mouth See admin instructions. Take 1 container by mouth three times a week or as needed of Nutritional Supplement   fluticasone 50 MCG/ACT nasal spray Commonly known as: FLONASE Place 2 sprays into both nostrils daily.   glipiZIDE 2.5 MG 24 hr tablet Commonly known as: GLUCOTROL XL Take 2.5 mg by mouth daily with breakfast.   haloperidol 10 MG tablet Commonly known as: HALDOL Take 5-10 mg by mouth See admin instructions. Take 5 mg in the morning and 10 mg at bedtime   lidocaine-prilocaine cream Commonly known as: EMLA Apply 1 application topically 3 (three) times a week. Apply over dialysis shunt  30 minutes prior to dialysis.   midodrine 5 MG tablet Commonly known as: PROAMATINE Take 5 mg by mouth 3 (three) times a week. (Take 1 hour prior to dialysis)   mirtazapine 15 MG tablet Commonly known as: REMERON Take 15 mg by mouth at bedtime.   niacin 1000 MG CR tablet Commonly known as: NIASPAN Take 1,000 mg by mouth at bedtime.   polyethylene glycol 17 g packet Commonly known as: MIRALAX / GLYCOLAX Take 17 g by mouth daily as needed (Constipation). Mix 17 g (1 capful) in to 8 ounces of Juice/Water   pravastatin 40 MG tablet Commonly known as: PRAVACHOL Take 40 mg by mouth at bedtime.   SENSIPAR PO Take by mouth.   sevelamer carbonate 800 MG tablet Commonly known as: RENVELA Take 2 tablets (1,600 mg total) by mouth 3 (three) times daily with meals.   Vitamin D3 125 MCG (5000 UT) Caps Take 5,000 Units by mouth once a week.         Allergies:  Allergies  Allergen Reactions   Nsaids Other (See Comments)    Cannot take due to renal disease    Family History: Family History  Problem Relation Age of Onset   Diabetes Mother    Kidney cancer Mother    Diabetes Sister    Stroke Brother    Prostate cancer Neg Hx    Bladder Cancer Neg Hx     Social History:  reports that he has never smoked. He has never used smokeless tobacco. He reports that he does not drink alcohol and does not use drugs.   Physical Exam: BP 96/67   Pulse (!) 102   Ht 5\' 2"  (1.575 m)   Wt 154 lb 8 oz (70.1 kg)   BMI 28.26 kg/m   Constitutional:  Alert, No acute distress. HEENT:  AT Psychiatric: Normal mood and affect.    Assessment & Plan:    1.  Elevated PSA Stable PSA 5.8 6 month follow-up with PSA/DRE At next visit, if stable PSA, will move to annual visits  I have reviewed the above documentation for accuracy and completeness, and I agree with the above.   Riki Altes, MD  [facility] [address] [place], [place] [address] [phone]

## 2023-08-31 ENCOUNTER — Telehealth (INDEPENDENT_AMBULATORY_CARE_PROVIDER_SITE_OTHER): Payer: Self-pay

## 2023-08-31 NOTE — Telephone Encounter (Signed)
Spoke with the patient's driver and gave him the instructions regarding the patient's left arm declot with possible permcath insertion on 09/01/23 with Dr. Wyn Quaker at the [facility]. Arrival time of 12:45 pm. Pre-procedure instructions were discussed and the driver stated he understood.

## 2023-09-01 ENCOUNTER — Ambulatory Visit
Admission: RE | Admit: 2023-09-01 | Discharge: 2023-09-01 | Disposition: A | Discharge status: Home / Self Care | Payer: Medicare Other | Attending: Vascular Surgery | Admitting: Vascular Surgery

## 2023-09-01 ENCOUNTER — Encounter
Admission: RE | Disposition: A | Discharge status: Home / Self Care | Payer: Self-pay | Source: Home / Self Care | Attending: Vascular Surgery

## 2023-09-01 ENCOUNTER — Other Ambulatory Visit: Payer: Self-pay

## 2023-09-01 DIAGNOSIS — Y841 Kidney dialysis as the cause of abnormal reaction of the patient, or of later complication, without mention of misadventure at the time of the procedure: Secondary | ICD-10-CM | POA: Diagnosis not present

## 2023-09-01 DIAGNOSIS — I953 Hypotension of hemodialysis: Secondary | ICD-10-CM | POA: Diagnosis not present

## 2023-09-01 DIAGNOSIS — E1122 Type 2 diabetes mellitus with diabetic chronic kidney disease: Secondary | ICD-10-CM | POA: Insufficient documentation

## 2023-09-01 DIAGNOSIS — I120 Hypertensive chronic kidney disease with stage 5 chronic kidney disease or end stage renal disease: Secondary | ICD-10-CM | POA: Insufficient documentation

## 2023-09-01 DIAGNOSIS — N186 End stage renal disease: Secondary | ICD-10-CM | POA: Insufficient documentation

## 2023-09-01 DIAGNOSIS — Z992 Dependence on renal dialysis: Secondary | ICD-10-CM | POA: Insufficient documentation

## 2023-09-01 DIAGNOSIS — T82868A Thrombosis of vascular prosthetic devices, implants and grafts, initial encounter: Secondary | ICD-10-CM | POA: Diagnosis present

## 2023-09-01 HISTORY — PX: PERIPHERAL VASCULAR THROMBECTOMY: CATH118306

## 2023-09-01 LAB — POTASSIUM (ARMC VASCULAR LAB ONLY): Potassium (ARMC vascular lab): 5.8 mmol/L — ABNORMAL HIGH (ref 3.5–5.1)

## 2023-09-01 LAB — GLUCOSE, CAPILLARY: Glucose-Capillary: 107 mg/dL — ABNORMAL HIGH (ref 70–99)

## 2023-09-01 SURGERY — PERIPHERAL VASCULAR THROMBECTOMY
Anesthesia: Moderate Sedation | Laterality: Left

## 2023-09-01 MED ORDER — HYDROMORPHONE HCL 1 MG/ML IJ SOLN: 1.0000 mg | Freq: Once | INTRAMUSCULAR | Status: DC | PRN

## 2023-09-01 MED ORDER — DIPHENHYDRAMINE HCL 50 MG/ML IJ SOLN: 50.0000 mg | Freq: Once | INTRAMUSCULAR | Status: DC | PRN

## 2023-09-01 MED ORDER — HEPARIN SODIUM (PORCINE) 1000 UNIT/ML IJ SOLN
INTRAMUSCULAR | Status: DC | PRN
  Administered 2023-09-01: 3000 [IU] via INTRAVENOUS

## 2023-09-01 MED ORDER — CEFAZOLIN SODIUM-DEXTROSE 1-4 GM/50ML-% IV SOLN
1.0000 g | INTRAVENOUS | Status: AC
  Administered 2023-09-01: 1 g via INTRAVENOUS

## 2023-09-01 MED ORDER — MIDAZOLAM HCL 2 MG/ML PO SYRP: 8.0000 mg | ORAL_SOLUTION | Freq: Once | ORAL | Status: DC | PRN

## 2023-09-01 MED ORDER — ALTEPLASE 1 MG/ML SYRINGE FOR VASCULAR PROCEDURE
INTRAMUSCULAR | Status: DC | PRN
  Administered 2023-09-01: 4 mg via INTRA_ARTERIAL

## 2023-09-01 MED ORDER — ONDANSETRON HCL 4 MG/2ML IJ SOLN: 4.0000 mg | Freq: Four times a day (QID) | INTRAMUSCULAR | Status: DC | PRN

## 2023-09-01 MED ORDER — FENTANYL CITRATE (PF) 100 MCG/2ML IJ SOLN
INTRAMUSCULAR | Status: AC
  Filled 2023-09-01: qty 2

## 2023-09-01 MED ORDER — IODIXANOL 320 MG/ML IV SOLN
INTRAVENOUS | Status: DC | PRN
  Administered 2023-09-01: 35 mL via INTRAVENOUS

## 2023-09-01 MED ORDER — FENTANYL CITRATE (PF) 100 MCG/2ML IJ SOLN
INTRAMUSCULAR | Status: DC | PRN
  Administered 2023-09-01: 25 ug via INTRAVENOUS
  Administered 2023-09-01: 50 ug via INTRAVENOUS

## 2023-09-01 MED ORDER — MIDAZOLAM HCL 5 MG/5ML IJ SOLN
INTRAMUSCULAR | Status: AC
  Filled 2023-09-01: qty 5

## 2023-09-01 MED ORDER — CEFAZOLIN SODIUM-DEXTROSE 1-4 GM/50ML-% IV SOLN
INTRAVENOUS | Status: AC
  Filled 2023-09-01: qty 50

## 2023-09-01 MED ORDER — HEPARIN SODIUM (PORCINE) 1000 UNIT/ML IJ SOLN
INTRAMUSCULAR | Status: AC
  Filled 2023-09-01: qty 10

## 2023-09-01 MED ORDER — ALTEPLASE 2 MG IJ SOLR
INTRAMUSCULAR | Status: AC
  Filled 2023-09-01: qty 4

## 2023-09-01 MED ORDER — SODIUM CHLORIDE 0.9 % IV SOLN: INTRAVENOUS | Status: DC

## 2023-09-01 MED ORDER — MIDAZOLAM HCL 2 MG/2ML IJ SOLN
INTRAMUSCULAR | Status: DC | PRN
  Administered 2023-09-01: .5 mg via INTRAVENOUS
  Administered 2023-09-01: 2 mg via INTRAVENOUS

## 2023-09-01 MED ORDER — HEPARIN (PORCINE) IN NACL 2000-0.9 UNIT/L-% IV SOLN
INTRAVENOUS | Status: DC | PRN
  Administered 2023-09-01: 1000 mL

## 2023-09-01 MED ORDER — METHYLPREDNISOLONE SODIUM SUCC 125 MG IJ SOLR: 125.0000 mg | Freq: Once | INTRAMUSCULAR | Status: DC | PRN

## 2023-09-01 MED ORDER — FAMOTIDINE 20 MG PO TABS: 40.0000 mg | ORAL_TABLET | Freq: Once | ORAL | Status: DC | PRN

## 2023-09-01 MED ORDER — LIDOCAINE-EPINEPHRINE (PF) 1 %-1:200000 IJ SOLN
INTRAMUSCULAR | Status: DC | PRN
  Administered 2023-09-01: 10 mL via INTRADERMAL

## 2023-09-01 SURGICAL SUPPLY — 9 items
CANNULA 5F STIFF (CANNULA) IMPLANT
CATH EMBOLECTOMY 5FR (BALLOONS) IMPLANT
CATH THROMBEC 7F 65 CLEANER15 (MISCELLANEOUS) IMPLANT
COVER PROBE ULTRASOUND 5X96 (MISCELLANEOUS) IMPLANT
DRAPE BRACHIAL (DRAPES) IMPLANT
PACK ANGIOGRAPHY (CUSTOM PROCEDURE TRAY) ×1 IMPLANT
SHEATH BRITE TIP 6FRX5.5 (SHEATH) IMPLANT
SHEATH BRITE TIP 7FRX5.5 (SHEATH) IMPLANT
SUT MNCRL AB 4-0 PS2 18 (SUTURE) IMPLANT

## 2023-09-01 NOTE — Op Note (Signed)
VEIN AND VASCULAR SURGERY    OPERATIVE NOTE   PROCEDURE: 1.  Left brachiocephalic arteriovenous fistula and jump graft cannulation under ultrasound guidance in both a retrograde and then antegrade fashion crossing 2.  Left arm shuntogram and central venogram 3.  Catheter directed thrombolysis with 4 mg of TPA  4.  Mechanical thrombectomy to the left brachiocephalic AV fistula and jump graft with the cleaner 7 device 5.  Fogarty embolectomy for residual arterial plug   PRE-OPERATIVE DIAGNOSIS: 1. ESRD 2.  Thrombosed left brachiocephalic AV fistula and jump graft  POST-OPERATIVE DIAGNOSIS: same as above   SURGEON: Lawrence Barren, MD  ANESTHESIA: local with Moderate Conscious Sedation for approximately 23 minutes using 2.5 mg of Versed and 75 mcg of Fentanyl  ESTIMATED BLOOD LOSS: 10 cc  FINDING(S): Thrombosed fistula and jump graft able to be opened with thrombectomy  SPECIMEN(S):  None  CONTRAST: 35 cc  FLUORO TIME: 3.4 minutes  INDICATIONS: Patient is a 67 y.o.male who presents with a thrombosed left Brachiocephalic AV Fistula and jump graft.  The patient is scheduled for an attempted declot and shuntogram.  The patient is aware the risks include but are not limited to: bleeding, infection, thrombosis of the cannulated access, and possible anaphylactic reaction to the contrast.  The patient is aware of the risks of the procedure and elects to proceed forward.  DESCRIPTION: After full informed written consent was obtained, the patient was brought back to the angiography suite and placed supine upon the angiography table.  The patient was connected to monitoring equipment. Moderate conscious sedation was administered with a face to face encounter with the patient throughout the procedure with my supervision of the RN administering medicines and monitoring the patient's vital signs, pulse oximetry, telemetry and mental status throughout from the start of the procedure until  the patient was taken to the recovery room. The left arm was prepped and draped in the standard fashion for a percutaneous access intervention.  Under ultrasound guidance, the left brachiocephalic AV fistula was cannulated with a micropuncture needle under direct ultrasound guidance due to the pulseless nature of the graft in both an antegrade and a retrograde fashion crossing, and permanent images were performed.  The microwire was advanced and the needle was exchanged for the a microsheath.  I then upsized to a 7 Fr Sheath and imaging was performed.  Hand injections were completed to image the access including the central venous system. This demonstrated no flow within the AV fistula and jump graft.  Based on the images, this patient will need extensive treatment to salvage the graft. I then gave the patient 3000 units of intravenous heparin.  4 mg of TPA were deployed. This was allowed to dwell. Mechanical thrombectomy using the cleaner device 7 [place] was then performed throughout the jump graft and into the cephalic vein.   A residual arterial plug was also seen at the arterial anastomosis. An attempt to clear the arterial plug was done with 3 passes of the Fogarty embolectomy balloon. Flow-limiting arterial plug remained, and I elected to treat this lesion with the 7 [place] cleaner device. This resulted in resolution of the arterial plug, and clearance of the arterial side of the graft. The arterial outflow was seen to be intact distally. The retrograde sheath was removed. I then turned my attention to the thrombus in the distal jump graft and the cephalic vein. Mechanical thrombectomy was performed. This resulted in resolution of the thrombus and restored patency of the fistula with  no greater than 50% stenosis identified. Based on the completion imaging, no further intervention is necessary.  The wire and balloon were removed from the sheath.  A 4-0 Monocryl purse-string suture was sewn around the sheath.   The sheath was removed while tying down the suture.  A sterile bandage was applied to the puncture site.  COMPLICATIONS: None  CONDITION: Stable   Lawrence [name] 09/01/2023 2:59 PM   This note was created with Dragon Medical transcription system. Any errors in dictation are purely unintentional.

## 2023-09-01 NOTE — H&P (Signed)
[facility] Admission History & Physical  MRN : [id number]  Lawrence [name] is a 67 y.o. (08-09-1956) male who presents with chief complaint of No chief complaint on file. [name]  History of Present Illness: I am asked to evaluate the patient by the dialysis center. The patient was sent here because they were unable to cannulate the left arm access this morning. Furthermore the Center states there is no thrill or bruit. The patient states this is the first dialysis run to be missed. This problem is acute in onset and has been present for approximately 2 days. The patient is unaware of any other change.   Patient denies pain or tenderness overlying the access.  There is no pain with dialysis.  The patient denies hand pain or finger pain consistent with steal syndrome.    There have been many past interventions or declots of this access.  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  Current Facility-Administered Medications  Medication Dose Route Frequency Provider Last Rate Last Admin   0.9 %  sodium chloride infusion   Intravenous Continuous Georgiana Spinner, NP       ceFAZolin (ANCEF) IVPB 1 g/50 mL premix  1 g Intravenous 30 min Pre-Op Georgiana Spinner, NP       diphenhydrAMINE (BENADRYL) injection 50 mg  50 mg Intravenous Once PRN Georgiana Spinner, NP       famotidine (PEPCID) tablet 40 mg  40 mg Oral Once PRN Georgiana Spinner, NP       HYDROmorphone (DILAUDID) injection 1 mg  1 mg Intravenous Once PRN Georgiana Spinner, NP       methylPREDNISolone sodium succinate (SOLU-MEDROL) 125 mg/2 mL injection 125 mg  125 mg Intravenous Once PRN Georgiana Spinner, NP       midazolam (VERSED) 2 MG/ML syrup 8 mg  8 mg Oral Once PRN Georgiana Spinner, NP       ondansetron [facility]) injection 4 mg  4 mg Intravenous Q6H PRN Georgiana Spinner, NP        Past Medical History:  Diagnosis Date   Anemia    Cerebral hemorrhage (HCC) 2012   Chronic constipation    Chronic kidney disease     Diabetes mellitus without complication (HCC)    Hemodialysis patient (HCC)    Hyperlipidemia    Hypertension    Schizophrenia (HCC)    Stroke [facility])     Past Surgical History:  Procedure Laterality Date   A/V FISTULAGRAM Left 02/24/2017   Procedure: A/V Fistulagram;  Surgeon: Renford Dills, MD;  Location: [facility] INVASIVE CV LAB;  Service: Cardiovascular;  Laterality: Left;   A/V FISTULAGRAM Left 09/03/2020   Procedure: A/V FISTULAGRAM;  Surgeon: Annice Needy, MD;  Location: [facility];  Service: Cardiovascular;  Laterality: Left;   A/V FISTULAGRAM Left 06/27/2021   Procedure: A/V FISTULAGRAM;  Surgeon: Annice Needy, MD;  Location: [facility];  Service: Cardiovascular;  Laterality: Left;   A/V FISTULAGRAM Left 07/24/2021   Procedure: A/V FISTULAGRAM;  Surgeon: Annice Needy, MD;  Location: [facility];  Service: Cardiovascular;  Laterality: Left;   A/V FISTULAGRAM Left 03/25/2022   Procedure: A/V Fistulagram;  Surgeon: Bertram Denver, MD;  Location: [facility];  Service: Cardiovascular;  Laterality: Left;   A/V FISTULAGRAM N/A 06/12/2022   Procedure: A/V Fistulagram;  Surgeon: Renford Dills, MD;  Location: [facility];  Service: Cardiovascular;  Laterality: N/A;   DIALYSIS/PERMA CATHETER INSERTION N/A 12/03/2021   Procedure: DIALYSIS/PERMA CATHETER INSERTION;  Surgeon: Annice Needy, MD;  Location: [facility];  Service: Cardiovascular;  Laterality: N/A;   DIALYSIS/PERMA CATHETER INSERTION N/A 03/27/2022   Procedure: DIALYSIS/PERMA CATHETER INSERTION;  Surgeon: Annice Needy, MD;  Location: [facility];  Service: Cardiovascular;  Laterality: N/A;   DIALYSIS/PERMA CATHETER REMOVAL N/A 03/13/2022   Procedure: DIALYSIS/PERMA CATHETER REMOVAL;  Surgeon: Annice Needy, MD;  Location: [facility];  Service: Cardiovascular;  Laterality: N/A;   PERIPHERAL VASCULAR CATHETERIZATION N/A 04/17/2015   Procedure: Dialysis/Perma  Catheter Insertion;  Surgeon: Renford Dills, MD;  Location: [facility];  Service: Cardiovascular;  Laterality: N/A;   PERIPHERAL VASCULAR CATHETERIZATION Left 07/30/2015   Procedure: A/V Shuntogram/Fistulagram;  Surgeon: Annice Needy, MD;  Location: [facility];  Service: Cardiovascular;  Laterality: Left;   PERIPHERAL VASCULAR CATHETERIZATION Left 07/30/2015   Procedure: A/V Shunt Intervention;  Surgeon: Annice Needy, MD;  Location: [facility];  Service: Cardiovascular;  Laterality: Left;   PERIPHERAL VASCULAR CATHETERIZATION N/A 08/20/2015   Procedure: DIALYSIS/PERMA CATHETER REMOVAL;  Surgeon: Annice Needy, MD;  Location: [facility];  Service: Cardiovascular;  Laterality: N/A;   PERIPHERAL VASCULAR THROMBECTOMY Left 03/27/2022   Procedure: PERIPHERAL VASCULAR THROMBECTOMY;  Surgeon: Annice Needy, MD;  Location: [facility];  Service: Cardiovascular;  Laterality: Left;   PERIPHERAL VASCULAR THROMBECTOMY Left 08/04/2022   Procedure: PERIPHERAL VASCULAR THROMBECTOMY;  Surgeon: Annice Needy, MD;  Location: [facility];  Service: Cardiovascular;  Laterality: Left;   REVISON OF ARTERIOVENOUS FISTULA Left 12/04/2021   Procedure: REVISON OF ARTERIOVENOUS FISTULA;  Surgeon: Annice Needy, MD;  Location: [facility];  Service: Vascular;  Laterality: Left;   TEMPORARY DIALYSIS CATHETER N/A 06/11/2022   Procedure: TEMPORARY DIALYSIS CATHETER;  Surgeon: Renford Dills, MD;  Location: [facility];  Service: Cardiovascular;  Laterality: N/A;   THROMBECTOMY W/ EMBOLECTOMY Left 12/04/2021   Procedure: THROMBECTOMY ARTERIOVENOUS FISTULA;  Surgeon: Annice Needy, MD;  Location: [facility] OR[facility];  Service: Vascular;  Laterality: Left;   VASCULAR SURGERY     Fistula placement     Social History   Tobacco Use   Smoking status: Never   Smokeless tobacco: Never  Substance Use Topics   Alcohol use: No   Drug use: No     Family History  Problem Relation  Age of Onset   Diabetes Mother    Kidney cancer Mother    Diabetes Sister    Stroke Brother    Prostate cancer Neg Hx    Bladder Cancer Neg Hx     No family history of bleeding or clotting disorders, autoimmune disease or porphyria  Allergies  Allergen Reactions   Nsaids Other (See Comments)    Cannot take due to renal disease     REVIEW OF SYSTEMS (Negative unless checked)  Constitutional: [] Weight loss  [] Fever  [] Chills Cardiac: [] Chest pain   [] Chest pressure   [] Palpitations   [] Shortness of breath when laying flat   [] Shortness of breath at rest   [x] Shortness of breath with exertion. Vascular:  [] Pain in legs with walking   [] Pain in legs at rest   [] Pain in legs when laying flat   [] Claudication   [] Pain in feet when walking  [] Pain in feet at rest  [] Pain in feet when laying flat   [] History of DVT   []   Phlebitis   [] Swelling in legs   [] Varicose veins   [] Non-healing ulcers Pulmonary:   [] Uses home oxygen   [] Productive cough   [] Hemoptysis   [] Wheeze  [] COPD   [] Asthma Neurologic:  [] Dizziness  [] Blackouts   [] Seizures   [x] History of stroke   [] History of TIA  [] Aphasia   [] Temporary blindness   [] Dysphagia   [] Weakness or numbness in arms   [] Weakness or numbness in legs Musculoskeletal:  [] Arthritis   [] Joint swelling   [] Joint pain   [] Low back pain Hematologic:  [] Easy bruising  [] Easy bleeding   [] Hypercoagulable state   [] Anemic  [] Hepatitis Gastrointestinal:  [] Blood in stool   [] Vomiting blood  [x] Gastroesophageal reflux/heartburn   [] Difficulty swallowing. Genitourinary:  [x] Chronic kidney disease   [] Difficult urination  [] Frequent urination  [] Burning with urination   [] Blood in urine Skin:  [] Rashes   [] Ulcers   [] Wounds Psychological:  [] History of anxiety   []  History of major depression.  Physical Examination  Vitals:   09/01/23 1350  BP: 129/89  Pulse: 83  Resp: 17  Temp: 97.9 F (36.6 C)  TempSrc: Axillary  SpO2: 98%  Weight: 73.3 kg  Height: 5'  2" (1.575 m)   Body mass index is 29.58 kg/m. Gen: WD/WN, NAD Head: [place]/AT, No temporalis wasting.  Ear/Nose/Throat: Hearing grossly intact, nares w/o erythema or drainage, oropharynx w/o Erythema/Exudate,  Eyes: Conjunctiva clear, sclera non-icteric Neck: Trachea midline.  No JVD.  Pulmonary:  Good air movement, respirations not labored, no use of accessory muscles.  Cardiac: RRR, normal S1, S2. Vascular: no thrill in left arm access Vessel Right Left  Radial Palpable Palpable   Musculoskeletal: M/S 5/5 throughout.  Extremities without ischemic changes.  No deformity or atrophy.  Neurologic: Sensation grossly intact in extremities.  Symmetrical.  Speech is fluent. Motor exam as listed above. Psychiatric: Judgment intact, Mood & affect appropriate for pt's clinical situation. Dermatologic: No rashes or ulcers noted.  No cellulitis or open wounds.    CBC Lab Results  Component Value Date   WBC 4.0 06/13/2022   HGB 10.8 (L) 06/13/2022   HCT 33.4 (L) 06/13/2022   MCV 98.8 06/13/2022   PLT 64 (L) 06/13/2022    BMET    Component Value Date/Time   NA 133 (L) 08/01/2022 2129   NA 140 03/21/2015 1512   K 4.9 08/01/2022 2129   K 4.0 03/21/2015 1512   CL 94 (L) 08/01/2022 2129   CL 95 (L) 03/21/2015 1512   CO2 23 08/01/2022 2129   CO2 36 (H) 03/21/2015 1512   GLUCOSE 89 08/01/2022 2129   GLUCOSE 117 (H) 03/21/2015 1512   BUN 40 (H) 06/13/2022 0930   BUN 64 (H) 03/21/2015 1512   CREATININE 10.56 (H) 08/01/2022 2129   CREATININE 7.71 (H) 03/21/2015 1512   CALCIUM 8.8 (L) 08/01/2022 2129   CALCIUM 8.5 (L) 03/21/2015 1512   GFRNONAA 5 (L) 08/01/2022 2129   GFRNONAA 7 (L) 03/21/2015 1512   GFRAA 21 (L) 03/08/2020 1756   GFRAA 8 (L) 03/21/2015 1512   CrCl cannot be calculated (Patient's most recent lab result is older than the maximum 21 days allowed.).  COAG Lab Results  Component Value Date   INR 1.1 06/10/2022   INR 1.1 12/13/2014    Radiology No results  found.  Assessment/Plan 1.  Complication dialysis device with thrombosis AV access:  Patient's dialysis access is thrombosed. The patient will undergo thrombectomy using interventional techniques.  The risks and benefits were described to  the patient.  All questions were answered.  The patient agrees to proceed with angiography and intervention. Potassium will be drawn to ensure that it is an appropriate level prior to performing thrombectomy. 2.  End-stage renal disease requiring hemodialysis:  Patient will continue dialysis therapy without further interruption if a successful thrombectomy is not achieved then catheter will be placed. Dialysis has already been arranged since the patient missed their previous session 3.  Hypertension:  Patient will continue medical management; nephrology is following no changes in oral medications. 4. Diabetes mellitus:  Glucose will be monitored and oral medications been held this morning once the patient has undergone the patient's procedure po intake will be reinitiated and again Accu-Cheks will be used to assess the blood glucose level and treat as needed. The patient will be restarted on the patient's usual hypoglycemic regime     Festus Barren, MD  09/01/2023 2:12 PM

## 2023-09-02 ENCOUNTER — Encounter: Payer: Self-pay | Admitting: Vascular Surgery

## 2023-10-16 ENCOUNTER — Encounter: Payer: Self-pay | Admitting: Family Medicine

## 2023-10-16 ENCOUNTER — Other Ambulatory Visit: Payer: Self-pay | Admitting: Family Medicine

## 2023-10-16 ENCOUNTER — Ambulatory Visit
Admission: RE | Admit: 2023-10-16 | Discharge: 2023-10-16 | Disposition: A | Discharge status: Home / Self Care | Payer: Medicare Other | Source: Ambulatory Visit | Attending: Family Medicine | Admitting: Family Medicine

## 2023-10-16 DIAGNOSIS — R109 Unspecified abdominal pain: Secondary | ICD-10-CM | POA: Insufficient documentation

## 2023-10-19 ENCOUNTER — Encounter (INDEPENDENT_AMBULATORY_CARE_PROVIDER_SITE_OTHER): Payer: Medicare Other

## 2023-10-19 ENCOUNTER — Ambulatory Visit (INDEPENDENT_AMBULATORY_CARE_PROVIDER_SITE_OTHER): Payer: Medicare Other | Admitting: Nurse Practitioner

## 2023-12-22 ENCOUNTER — Encounter (INDEPENDENT_AMBULATORY_CARE_PROVIDER_SITE_OTHER): Payer: Medicare Other

## 2023-12-22 ENCOUNTER — Ambulatory Visit (INDEPENDENT_AMBULATORY_CARE_PROVIDER_SITE_OTHER): Payer: Medicare Other | Admitting: Vascular Surgery

## 2024-02-04 ENCOUNTER — Encounter (INDEPENDENT_AMBULATORY_CARE_PROVIDER_SITE_OTHER): Payer: Self-pay | Admitting: Nurse Practitioner

## 2024-02-04 ENCOUNTER — Ambulatory Visit (INDEPENDENT_AMBULATORY_CARE_PROVIDER_SITE_OTHER): Payer: Medicare Other

## 2024-02-04 ENCOUNTER — Ambulatory Visit (INDEPENDENT_AMBULATORY_CARE_PROVIDER_SITE_OTHER): Payer: Medicare Other | Admitting: Nurse Practitioner

## 2024-02-04 VITALS — BP 91/60 | HR 102 | Resp 16 | Wt 157.6 lb

## 2024-02-04 DIAGNOSIS — I10 Essential (primary) hypertension: Secondary | ICD-10-CM | POA: Diagnosis not present

## 2024-02-04 DIAGNOSIS — E1169 Type 2 diabetes mellitus with other specified complication: Secondary | ICD-10-CM | POA: Diagnosis not present

## 2024-02-04 DIAGNOSIS — N186 End stage renal disease: Secondary | ICD-10-CM | POA: Diagnosis not present

## 2024-02-04 DIAGNOSIS — Z992 Dependence on renal dialysis: Secondary | ICD-10-CM | POA: Diagnosis not present

## 2024-02-04 DIAGNOSIS — E785 Hyperlipidemia, unspecified: Secondary | ICD-10-CM

## 2024-02-04 NOTE — Progress Notes (Signed)
 Subjective:    Patient ID: Lawrence [name], male    DOB: 02-04-1956, 68 y.o.   MRN: [id number] Chief Complaint  Patient presents with   Follow-up    Ultrasound follow up    The patient returns to the office for followup of their dialysis access.   The patient reports the function of the access has been stable. Patient denies difficulty with cannulation. The patient denies increased bleeding time after removing the needles. The patient denies hand pain or other symptoms consistent with steal phenomena.  No significant arm swelling.  The patient denies any complaints from the dialysis center or their nephrologist.  The patient denies redness or swelling at the access site. The patient denies fever or chills at home or while on dialysis.  No recent shortening of the patient's walking distance or new symptoms consistent with claudication.  No history of rest pain symptoms. No new ulcers or wounds of the lower extremities have occurred.  The patient denies amaurosis fugax or recent TIA symptoms. There are no recent neurological changes noted. There is no history of DVT, PE or superficial thrombophlebitis. No recent episodes of angina or shortness of breath documented.   Duplex ultrasound of the AV access shows a patent access.  The previously noted stenosis is not significantly changed compared to last study.  Flow volume today is 2047 cc/min (previous flow volume was 1804 cc/min)      Review of Systems  Hematological:  Does not bruise/bleed easily.  All other systems reviewed and are negative.      Objective:   Physical Exam Vitals reviewed.  HENT:     Head: Normocephalic.  Cardiovascular:     Rate and Rhythm: Normal rate.     Pulses: Normal pulses.          Radial pulses are 2+ on the left side.     Arteriovenous access: Left arteriovenous access is present.    Comments: Good thrill and bruit Pulmonary:     Effort: Pulmonary effort is normal.  Skin:    General: Skin is  warm and dry.  Neurological:     Mental Status: He is alert and oriented to person, place, and time.  Psychiatric:        Mood and Affect: Mood normal.        Behavior: Behavior normal.        Thought Content: Thought content normal.        Judgment: Judgment normal.     BP 91/60   Pulse (!) 102   Resp 16   Wt 157 lb 9.6 oz (71.5 kg)   BMI 28.83 kg/m   Past Medical History:  Diagnosis Date   Anemia    Cerebral hemorrhage (HCC) 2012   Chronic constipation    Chronic kidney disease    Diabetes mellitus without complication (HCC)    Hemodialysis patient (HCC)    Hyperlipidemia    Hypertension    Schizophrenia (HCC)    Stroke [facility])     Social History   Socioeconomic History   Marital status: Single    Spouse name: Not on file   Number of children: Not on file   Years of education: Not on file   Highest education level: Not on file  Occupational History   Not on file  Tobacco Use   Smoking status: Never   Smokeless tobacco: Never  Substance and Sexual Activity   Alcohol use: No   Drug use: No   Sexual activity:  Not Currently    Birth control/protection: None  Other Topics Concern   Not on file  Social History Narrative    Lives at group home: [facility] with 6 other people.   Social Drivers of Corporate investment banker Strain: Not on file  Food Insecurity: Not on file  Transportation Needs: Not on file  Physical Activity: Not on file  Stress: Not on file  Social Connections: Not on file  Intimate Partner Violence: Not on file    Past Surgical History:  Procedure Laterality Date   A/V FISTULAGRAM Left 02/24/2017   Procedure: A/V Fistulagram;  Surgeon: Renford Dills, MD;  Location: [facility];  Service: Cardiovascular;  Laterality: Left;   A/V FISTULAGRAM Left 09/03/2020   Procedure: A/V FISTULAGRAM;  Surgeon: Annice Needy, MD;  Location: [facility];  Service: Cardiovascular;  Laterality: Left;   A/V FISTULAGRAM  Left 06/27/2021   Procedure: A/V FISTULAGRAM;  Surgeon: Annice Needy, MD;  Location: [facility];  Service: Cardiovascular;  Laterality: Left;   A/V FISTULAGRAM Left 07/24/2021   Procedure: A/V FISTULAGRAM;  Surgeon: Annice Needy, MD;  Location: [facility];  Service: Cardiovascular;  Laterality: Left;   A/V FISTULAGRAM Left 03/25/2022   Procedure: A/V Fistulagram;  Surgeon: Bertram Denver, MD;  Location: [facility];  Service: Cardiovascular;  Laterality: Left;   A/V FISTULAGRAM N/A 06/12/2022   Procedure: A/V Fistulagram;  Surgeon: Renford Dills, MD;  Location: [facility];  Service: Cardiovascular;  Laterality: N/A;   DIALYSIS/PERMA CATHETER INSERTION N/A 12/03/2021   Procedure: DIALYSIS/PERMA CATHETER INSERTION;  Surgeon: Annice Needy, MD;  Location: [facility];  Service: Cardiovascular;  Laterality: N/A;   DIALYSIS/PERMA CATHETER INSERTION N/A 03/27/2022   Procedure: DIALYSIS/PERMA CATHETER INSERTION;  Surgeon: Annice Needy, MD;  Location: [facility];  Service: Cardiovascular;  Laterality: N/A;   DIALYSIS/PERMA CATHETER REMOVAL N/A 03/13/2022   Procedure: DIALYSIS/PERMA CATHETER REMOVAL;  Surgeon: Annice Needy, MD;  Location: [facility];  Service: Cardiovascular;  Laterality: N/A;   PERIPHERAL VASCULAR CATHETERIZATION N/A 04/17/2015   Procedure: Dialysis/Perma Catheter Insertion;  Surgeon: Renford Dills, MD;  Location: [facility];  Service: Cardiovascular;  Laterality: N/A;   PERIPHERAL VASCULAR CATHETERIZATION Left 07/30/2015   Procedure: A/V Shuntogram/Fistulagram;  Surgeon: Annice Needy, MD;  Location: [facility];  Service: Cardiovascular;  Laterality: Left;   PERIPHERAL VASCULAR CATHETERIZATION Left 07/30/2015   Procedure: A/V Shunt Intervention;  Surgeon: Annice Needy, MD;  Location: [facility];  Service: Cardiovascular;  Laterality: Left;   PERIPHERAL VASCULAR CATHETERIZATION N/A 08/20/2015    Procedure: DIALYSIS/PERMA CATHETER REMOVAL;  Surgeon: Annice Needy, MD;  Location: [facility];  Service: Cardiovascular;  Laterality: N/A;   PERIPHERAL VASCULAR THROMBECTOMY Left 03/27/2022   Procedure: PERIPHERAL VASCULAR THROMBECTOMY;  Surgeon: Annice Needy, MD;  Location: [facility];  Service: Cardiovascular;  Laterality: Left;   PERIPHERAL VASCULAR THROMBECTOMY Left 08/04/2022   Procedure: PERIPHERAL VASCULAR THROMBECTOMY;  Surgeon: Annice Needy, MD;  Location: [facility];  Service: Cardiovascular;  Laterality: Left;   PERIPHERAL VASCULAR THROMBECTOMY Left 09/01/2023   Procedure: PERIPHERAL VASCULAR THROMBECTOMY;  Surgeon: Annice Needy, MD;  Location: [facility];  Service: Cardiovascular;  Laterality: Left;   REVISON OF ARTERIOVENOUS FISTULA Left 12/04/2021   Procedure: REVISON OF ARTERIOVENOUS FISTULA;  Surgeon: Festus Barren  S, MD;  Location: [facility];  Service: Vascular;  Laterality: Left;   TEMPORARY DIALYSIS CATHETER N/A 06/11/2022   Procedure: TEMPORARY DIALYSIS CATHETER;  Surgeon: Renford Dills, MD;  Location: [facility];  Service: Cardiovascular;  Laterality: N/A;   THROMBECTOMY W/ EMBOLECTOMY Left 12/04/2021   Procedure: THROMBECTOMY ARTERIOVENOUS FISTULA;  Surgeon: Annice Needy, MD;  Location: [facility];  Service: Vascular;  Laterality: Left;   VASCULAR SURGERY     Fistula placement    Family History  Problem Relation Age of Onset   Diabetes Mother    Kidney cancer Mother    Diabetes Sister    Stroke Brother    Prostate cancer Neg Hx    Bladder Cancer Neg Hx     Allergies  Allergen Reactions   Nsaids Other (See Comments)    Cannot take due to renal disease       Latest Ref Rng & Units 06/13/2022    9:30 AM 06/12/2022    8:56 AM 06/11/2022    3:58 AM  CBC  WBC 4.0 - 10.5 K/uL 4.0  4.1  5.3   Hemoglobin 13.0 - 17.0 g/dL 95.2  84.1  32.4   Hematocrit 39.0 - 52.0 % 33.4  34.9  34.2   Platelets 150 - 400 K/uL 64  70  72        CMP     Component Value Date/Time   NA 133 (L) 08/01/2022 2129   NA 140 03/21/2015 1512   K 4.9 08/01/2022 2129   K 4.0 03/21/2015 1512   CL 94 (L) 08/01/2022 2129   CL 95 (L) 03/21/2015 1512   CO2 23 08/01/2022 2129   CO2 36 (H) 03/21/2015 1512   GLUCOSE 89 08/01/2022 2129   GLUCOSE 117 (H) 03/21/2015 1512   BUN 40 (H) 06/13/2022 0930   BUN 64 (H) 03/21/2015 1512   CREATININE 10.56 (H) 08/01/2022 2129   CREATININE 7.71 (H) 03/21/2015 1512   CALCIUM 8.8 (L) 08/01/2022 2129   CALCIUM 8.5 (L) 03/21/2015 1512   PROT 6.8 08/01/2022 2129   PROT 5.8 (L) 12/13/2014 1354   ALBUMIN 3.4 (L) 08/01/2022 2129   ALBUMIN 2.5 (L) 12/13/2014 1354   AST 16 08/01/2022 2129   AST 17 12/13/2014 1354   ALT 14 08/01/2022 2129   ALT 21 12/13/2014 1354   ALKPHOS 149 (H) 08/01/2022 2129   ALKPHOS 50 12/13/2014 1354   BILITOT 0.8 08/01/2022 2129   BILITOT 0.2 12/13/2014 1354   GFRNONAA 5 (L) 08/01/2022 2129   GFRNONAA 7 (L) 03/21/2015 1512     No results found.     Assessment & Plan:   1. ESRD on dialysis [facility]) (Primary) Recommend:  The patient is doing well and currently has adequate dialysis access. The patient's dialysis center is not reporting any access issues. Flow pattern is stable when compared to the prior ultrasound.  The patient should have a duplex ultrasound of the dialysis access in 6 months. The patient will follow-up with me in the office after each ultrasound    2. Type 2 diabetes mellitus with hyperlipidemia (HCC) Continue hypoglycemic medications as already ordered, these medications have been reviewed and there are no changes at this time.  Hgb A1C to be monitored as already arranged by primary service  3. Essential hypertension Continue antihypertensive medications as already ordered, these medications have been reviewed and there are no changes at this time.   Current Outpatient Medications on File Prior to Visit  Medication Sig  Dispense Refill    amLODipine (NORVASC) 10 MG tablet Take 10 mg by mouth daily.     aspirin EC 81 MG tablet Take 81 mg by mouth daily.      benztropine (COGENTIN) 0.5 MG tablet Take 0.5 mg by mouth 2 (two) times daily.     carvedilol (COREG) 25 MG tablet Take 25 mg by mouth 2 (two) times daily with a meal.     cetirizine (ZYRTEC) 10 MG tablet Take 10 mg by mouth every other day.     Cholecalciferol (VITAMIN D3) 125 MCG (5000 UT) CAPS Take 5,000 Units by mouth once a week.     Coenzyme Q10 100 MG capsule Take 100 mg by mouth at bedtime.     diphenhydrAMINE (BENADRYL) 25 mg capsule Take 25 mg by mouth See admin instructions. Take 1 capsule (25mg ) by mouth nightly as needed for sleep - may take 1 capsule (25mg ) by mouth during the daytime as needed for itching     DIPHENHYDRAMINE-ZINC OXIDE EX Apply 1 application. topically See admin instructions. Apply small amount of affected area three times daily as needed     docusate sodium (COLACE) 100 MG capsule Take 100 mg by mouth 2 (two) times daily.     docusate sodium (COLACE) 100 MG capsule Take 200 mg by mouth daily as needed for mild constipation or moderate constipation.     EASYMAX TEST test strip EasyMax     feeding supplement (BOOST HIGH PROTEIN) LIQD Take 1 Container by mouth See admin instructions. Take 1 container by mouth three times a week or as needed of Nutritional Supplement     fluticasone (FLONASE) 50 MCG/ACT nasal spray Place 2 sprays into both nostrils daily.     glipiZIDE (GLUCOTROL XL) 2.5 MG 24 hr tablet Take 2.5 mg by mouth daily with breakfast.      haloperidol (HALDOL) 10 MG tablet Take 5-10 mg by mouth See admin instructions. Take 5 mg in the morning and 10 mg at bedtime     lidocaine-prilocaine (EMLA) cream Apply 1 application topically 3 (three) times a week. Apply over dialysis shunt 30 minutes prior to dialysis.     midodrine (PROAMATINE) 5 MG tablet Take 5 mg by mouth 3 (three) times a week. (Take 1 hour prior to dialysis)     mirtazapine  (REMERON) 15 MG tablet Take 15 mg by mouth at bedtime.     niacin (NIASPAN) 1000 MG CR tablet Take 1,000 mg by mouth at bedtime.      pravastatin (PRAVACHOL) 40 MG tablet Take 40 mg by mouth at bedtime.      sevelamer carbonate (RENVELA) 800 MG tablet Take 2 tablets (1,600 mg total) by mouth 3 (three) times daily with meals. 180 tablet 0   acetaminophen (TYLENOL) 500 MG tablet Take 500 mg by mouth every 4 (four) hours as needed for mild pain. (Patient not taking: Reported on 09/01/2023)     polyethylene glycol (MIRALAX / GLYCOLAX) 17 g packet Take 17 g by mouth daily as needed (Constipation). Mix 17 g (1 capful) in to 8 ounces of Juice/Water (Patient not taking: Reported on 09/01/2023)     No current facility-administered medications on file prior to visit.    There are no Patient Instructions on file for this visit. No follow-ups on file.   Georgiana Spinner, NP

## 2024-02-05 ENCOUNTER — Other Ambulatory Visit: Payer: Medicare Other

## 2024-02-10 ENCOUNTER — Ambulatory Visit: Payer: Medicare Other | Admitting: Urology

## 2024-02-15 ENCOUNTER — Other Ambulatory Visit: Payer: Self-pay

## 2024-02-15 DIAGNOSIS — R972 Elevated prostate specific antigen [PSA]: Secondary | ICD-10-CM

## 2024-02-16 ENCOUNTER — Encounter: Payer: Self-pay | Admitting: Urology

## 2024-02-16 ENCOUNTER — Other Ambulatory Visit: Payer: Self-pay

## 2024-02-18 ENCOUNTER — Ambulatory Visit (INDEPENDENT_AMBULATORY_CARE_PROVIDER_SITE_OTHER): Payer: Self-pay | Admitting: Urology

## 2024-02-18 ENCOUNTER — Encounter: Payer: Self-pay | Admitting: Urology

## 2024-02-18 VITALS — BP 115/70 | HR 111 | Ht 67.0 in | Wt 156.0 lb

## 2024-02-18 DIAGNOSIS — R972 Elevated prostate specific antigen [PSA]: Secondary | ICD-10-CM | POA: Diagnosis not present

## 2024-02-18 NOTE — Progress Notes (Signed)
 I, Maysun Anabel Bene, acting as a scribe for Riki Altes, MD., have documented all relevant documentation on the behalf of Riki Altes, MD, as directed by Riki Altes, MD while in the presence of Riki Altes, MD.  02/18/2024 2:07 PM   Lawrence [name] [birth year] [id number]  Referring provider: [name], MD [address] [place],  [place] [address]  Chief Complaint  Patient presents with   Elevated PSA    HPI: Lawrence [name] is a 68 y.o. male presents for a 6 month follow-up.  Doing well since last visit No bothersome LUTS, though is oliguric. Denies dysuria, gross hematuria Denies flank, abdominal or pelvic pain He had a lab visit scheduled on 3/11 which he missed.    PMH: Past Medical History:  Diagnosis Date   Anemia    Cerebral hemorrhage (HCC) 2012   Chronic constipation    Chronic kidney disease    Diabetes mellitus without complication (HCC)    Hemodialysis patient (HCC)    Hyperlipidemia    Hypertension    Schizophrenia (HCC)    Stroke [facility])     Surgical History: Past Surgical History:  Procedure Laterality Date   A/V FISTULAGRAM Left 02/24/2017   Procedure: A/V Fistulagram;  Surgeon: Renford Dills, MD;  Location: [facility];  Service: Cardiovascular;  Laterality: Left;   A/V FISTULAGRAM Left 09/03/2020   Procedure: A/V FISTULAGRAM;  Surgeon: Annice Needy, MD;  Location: [facility];  Service: Cardiovascular;  Laterality: Left;   A/V FISTULAGRAM Left 06/27/2021   Procedure: A/V FISTULAGRAM;  Surgeon: Annice Needy, MD;  Location: [facility];  Service: Cardiovascular;  Laterality: Left;   A/V FISTULAGRAM Left 07/24/2021   Procedure: A/V FISTULAGRAM;  Surgeon: Annice Needy, MD;  Location: [facility];  Service: Cardiovascular;  Laterality: Left;   A/V FISTULAGRAM Left 03/25/2022   Procedure: A/V Fistulagram;  Surgeon: Bertram Denver, MD;  Location: [facility];  Service: Cardiovascular;   Laterality: Left;   A/V FISTULAGRAM N/A 06/12/2022   Procedure: A/V Fistulagram;  Surgeon: Renford Dills, MD;  Location: [facility];  Service: Cardiovascular;  Laterality: N/A;   DIALYSIS/PERMA CATHETER INSERTION N/A 12/03/2021   Procedure: DIALYSIS/PERMA CATHETER INSERTION;  Surgeon: Annice Needy, MD;  Location: [facility];  Service: Cardiovascular;  Laterality: N/A;   DIALYSIS/PERMA CATHETER INSERTION N/A 03/27/2022   Procedure: DIALYSIS/PERMA CATHETER INSERTION;  Surgeon: Annice Needy, MD;  Location: [facility];  Service: Cardiovascular;  Laterality: N/A;   DIALYSIS/PERMA CATHETER REMOVAL N/A 03/13/2022   Procedure: DIALYSIS/PERMA CATHETER REMOVAL;  Surgeon: Annice Needy, MD;  Location: [facility];  Service: Cardiovascular;  Laterality: N/A;   PERIPHERAL VASCULAR CATHETERIZATION N/A 04/17/2015   Procedure: Dialysis/Perma Catheter Insertion;  Surgeon: Renford Dills, MD;  Location: [facility];  Service: Cardiovascular;  Laterality: N/A;   PERIPHERAL VASCULAR CATHETERIZATION Left 07/30/2015   Procedure: A/V Shuntogram/Fistulagram;  Surgeon: Annice Needy, MD;  Location: [facility];  Service: Cardiovascular;  Laterality: Left;   PERIPHERAL VASCULAR CATHETERIZATION Left 07/30/2015   Procedure: A/V Shunt Intervention;  Surgeon: Annice Needy, MD;  Location: [facility];  Service: Cardiovascular;  Laterality: Left;   PERIPHERAL VASCULAR CATHETERIZATION N/A 08/20/2015   Procedure: DIALYSIS/PERMA CATHETER REMOVAL;  Surgeon: Annice Needy, MD;  Location: [facility];  Service: Cardiovascular;  Laterality: N/A;  PERIPHERAL VASCULAR THROMBECTOMY Left 03/27/2022   Procedure: PERIPHERAL VASCULAR THROMBECTOMY;  Surgeon: Annice Needy, MD;  Location: [facility];  Service: Cardiovascular;  Laterality: Left;   PERIPHERAL VASCULAR THROMBECTOMY Left 08/04/2022   Procedure: PERIPHERAL VASCULAR THROMBECTOMY;  Surgeon: Annice Needy, MD;   Location: [facility];  Service: Cardiovascular;  Laterality: Left;   PERIPHERAL VASCULAR THROMBECTOMY Left 09/01/2023   Procedure: PERIPHERAL VASCULAR THROMBECTOMY;  Surgeon: Annice Needy, MD;  Location: [facility];  Service: Cardiovascular;  Laterality: Left;   REVISON OF ARTERIOVENOUS FISTULA Left 12/04/2021   Procedure: REVISON OF ARTERIOVENOUS FISTULA;  Surgeon: Annice Needy, MD;  Location: [facility];  Service: Vascular;  Laterality: Left;   TEMPORARY DIALYSIS CATHETER N/A 06/11/2022   Procedure: TEMPORARY DIALYSIS CATHETER;  Surgeon: Renford Dills, MD;  Location: [facility];  Service: Cardiovascular;  Laterality: N/A;   THROMBECTOMY W/ EMBOLECTOMY Left 12/04/2021   Procedure: THROMBECTOMY ARTERIOVENOUS FISTULA;  Surgeon: Annice Needy, MD;  Location: [facility];  Service: Vascular;  Laterality: Left;   VASCULAR SURGERY     Fistula placement    Home Medications:  Allergies as of 02/18/2024       Reactions   Nsaids Other (See Comments)   Cannot take due to renal disease        Medication List        Accurate as of February 18, 2024  2:07 PM. If you have any questions, ask your nurse or doctor.          STOP taking these medications    acetaminophen 500 MG tablet Commonly known as: TYLENOL Stopped by: Riki Altes       TAKE these medications    amLODipine 10 MG tablet Commonly known as: NORVASC Take 10 mg by mouth daily.   aspirin EC 81 MG tablet Take 81 mg by mouth daily.   benztropine 0.5 MG tablet Commonly known as: COGENTIN Take 0.5 mg by mouth 2 (two) times daily.   carvedilol 25 MG tablet Commonly known as: COREG Take 25 mg by mouth 2 (two) times daily with a meal.   cetirizine 10 MG tablet Commonly known as: ZYRTEC Take 10 mg by mouth every other day.   Coenzyme Q10 100 MG capsule Take 100 mg by mouth at bedtime.   diphenhydrAMINE 25 mg capsule Commonly known as: BENADRYL Take 25 mg by mouth See admin  instructions. Take 1 capsule (25mg ) by mouth nightly as needed for sleep - may take 1 capsule (25mg ) by mouth during the daytime as needed for itching   DIPHENHYDRAMINE-ZINC OXIDE EX Apply 1 application. topically See admin instructions. Apply small amount of affected area three times daily as needed   docusate sodium 100 MG capsule Commonly known as: COLACE Take 100 mg by mouth 2 (two) times daily.   docusate sodium 100 MG capsule Commonly known as: COLACE Take 200 mg by mouth daily as needed for mild constipation or moderate constipation.   EasyMax Test test strip Generic drug: glucose blood EasyMax   feeding supplement Liqd Take 1 Container by mouth See admin instructions. Take 1 container by mouth three times a week or as needed of Nutritional Supplement   fluticasone 50 MCG/ACT nasal spray Commonly known as: FLONASE Place 2 sprays into both nostrils daily.   glipiZIDE 2.5 MG 24 hr tablet Commonly known as: GLUCOTROL XL Take 2.5 mg by mouth daily with breakfast.   haloperidol 10 MG tablet Commonly known as:  HALDOL Take 5-10 mg by mouth See admin instructions. Take 5 mg in the morning and 10 mg at bedtime   lidocaine-prilocaine cream Commonly known as: EMLA Apply 1 application topically 3 (three) times a week. Apply over dialysis shunt 30 minutes prior to dialysis.   midodrine 5 MG tablet Commonly known as: PROAMATINE Take 5 mg by mouth 3 (three) times a week. (Take 1 hour prior to dialysis)   mirtazapine 15 MG tablet Commonly known as: REMERON Take 15 mg by mouth at bedtime.   niacin 1000 MG CR tablet Commonly known as: NIASPAN Take 1,000 mg by mouth at bedtime.   polyethylene glycol 17 g packet Commonly known as: MIRALAX / GLYCOLAX Take 17 g by mouth daily as needed (Constipation). Mix 17 g (1 capful) in to 8 ounces of Juice/Water   pravastatin 40 MG tablet Commonly known as: PRAVACHOL Take 40 mg by mouth at bedtime.   sevelamer carbonate 800 MG  tablet Commonly known as: RENVELA Take 2 tablets (1,600 mg total) by mouth 3 (three) times daily with meals.   Vitamin D3 125 MCG (5000 UT) Caps Take 5,000 Units by mouth once a week.        Allergies:  Allergies  Allergen Reactions   Nsaids Other (See Comments)    Cannot take due to renal disease    Family History: Family History  Problem Relation Age of Onset   Diabetes Mother    Kidney cancer Mother    Diabetes Sister    Stroke Brother    Prostate cancer Neg Hx    Bladder Cancer Neg Hx     Social History:  reports that he has never smoked. He has never used smokeless tobacco. He reports that he does not drink alcohol and does not use drugs.   Physical Exam: BP 115/70   Pulse (!) 111   Ht 5\' 7"  (1.702 m)   Wt 156 lb (70.8 kg)   BMI 24.43 kg/m   Constitutional:  Alert and oriented, No acute distress. HEENT: [place] AT Respiratory: Normal respiratory effort, no increased work of breathing. GU: Declined DRE Psychiatric: Normal mood and affect.   Assessment & Plan:    1. Elevated PSA PSA drawn today and if stable, will move to annual follow-up  I have reviewed the above documentation for accuracy and completeness, and I agree with the above.   Riki Altes, MD  [facility] [address] [place], [place] [address] [phone]

## 2024-02-19 LAB — PSA: Prostate Specific Ag, Serum: 12.6 ng/mL — ABNORMAL HIGH (ref 0.0–4.0)

## 2024-02-26 ENCOUNTER — Other Ambulatory Visit: Payer: Self-pay

## 2024-02-26 ENCOUNTER — Encounter: Payer: Self-pay | Admitting: Medical Oncology

## 2024-02-26 ENCOUNTER — Inpatient Hospital Stay
Admission: EM | Admit: 2024-02-26 | Discharge: 2024-02-29 | DRG: 252 | Disposition: A | Discharge status: Home / Self Care | Attending: Internal Medicine | Admitting: Internal Medicine

## 2024-02-26 DIAGNOSIS — Z7984 Long term (current) use of oral hypoglycemic drugs: Secondary | ICD-10-CM

## 2024-02-26 DIAGNOSIS — N186 End stage renal disease: Secondary | ICD-10-CM | POA: Insufficient documentation

## 2024-02-26 DIAGNOSIS — Z833 Family history of diabetes mellitus: Secondary | ICD-10-CM

## 2024-02-26 DIAGNOSIS — Z886 Allergy status to analgesic agent status: Secondary | ICD-10-CM

## 2024-02-26 DIAGNOSIS — Z823 Family history of stroke: Secondary | ICD-10-CM

## 2024-02-26 DIAGNOSIS — Z79899 Other long term (current) drug therapy: Secondary | ICD-10-CM

## 2024-02-26 DIAGNOSIS — Z794 Long term (current) use of insulin: Secondary | ICD-10-CM

## 2024-02-26 DIAGNOSIS — Z8051 Family history of malignant neoplasm of kidney: Secondary | ICD-10-CM

## 2024-02-26 DIAGNOSIS — T82868A Thrombosis of vascular prosthetic devices, implants and grafts, initial encounter: Secondary | ICD-10-CM | POA: Diagnosis not present

## 2024-02-26 DIAGNOSIS — E663 Overweight: Secondary | ICD-10-CM | POA: Insufficient documentation

## 2024-02-26 DIAGNOSIS — Z7982 Long term (current) use of aspirin: Secondary | ICD-10-CM

## 2024-02-26 DIAGNOSIS — E785 Hyperlipidemia, unspecified: Secondary | ICD-10-CM | POA: Diagnosis present

## 2024-02-26 DIAGNOSIS — T82898A Other specified complication of vascular prosthetic devices, implants and grafts, initial encounter: Secondary | ICD-10-CM | POA: Diagnosis not present

## 2024-02-26 DIAGNOSIS — E1122 Type 2 diabetes mellitus with diabetic chronic kidney disease: Secondary | ICD-10-CM | POA: Diagnosis present

## 2024-02-26 DIAGNOSIS — I120 Hypertensive chronic kidney disease with stage 5 chronic kidney disease or end stage renal disease: Secondary | ICD-10-CM | POA: Diagnosis present

## 2024-02-26 DIAGNOSIS — I10 Essential (primary) hypertension: Secondary | ICD-10-CM | POA: Diagnosis not present

## 2024-02-26 DIAGNOSIS — R972 Elevated prostate specific antigen [PSA]: Secondary | ICD-10-CM

## 2024-02-26 DIAGNOSIS — Z91158 Patient's noncompliance with renal dialysis for other reason: Secondary | ICD-10-CM

## 2024-02-26 DIAGNOSIS — F259 Schizoaffective disorder, unspecified: Secondary | ICD-10-CM | POA: Diagnosis present

## 2024-02-26 DIAGNOSIS — D631 Anemia in chronic kidney disease: Secondary | ICD-10-CM | POA: Diagnosis present

## 2024-02-26 DIAGNOSIS — Z1152 Encounter for screening for COVID-19: Secondary | ICD-10-CM

## 2024-02-26 DIAGNOSIS — E1129 Type 2 diabetes mellitus with other diabetic kidney complication: Secondary | ICD-10-CM | POA: Diagnosis present

## 2024-02-26 DIAGNOSIS — Z8673 Personal history of transient ischemic attack (TIA), and cerebral infarction without residual deficits: Secondary | ICD-10-CM

## 2024-02-26 DIAGNOSIS — I639 Cerebral infarction, unspecified: Secondary | ICD-10-CM | POA: Diagnosis present

## 2024-02-26 DIAGNOSIS — Y832 Surgical operation with anastomosis, bypass or graft as the cause of abnormal reaction of the patient, or of later complication, without mention of misadventure at the time of the procedure: Secondary | ICD-10-CM | POA: Diagnosis present

## 2024-02-26 DIAGNOSIS — Z6824 Body mass index (BMI) 24.0-24.9, adult: Secondary | ICD-10-CM

## 2024-02-26 DIAGNOSIS — N2581 Secondary hyperparathyroidism of renal origin: Secondary | ICD-10-CM | POA: Diagnosis present

## 2024-02-26 DIAGNOSIS — T82590A Other mechanical complication of surgically created arteriovenous fistula, initial encounter: Principal | ICD-10-CM

## 2024-02-26 DIAGNOSIS — D696 Thrombocytopenia, unspecified: Secondary | ICD-10-CM | POA: Diagnosis present

## 2024-02-26 DIAGNOSIS — Z992 Dependence on renal dialysis: Secondary | ICD-10-CM

## 2024-02-26 LAB — CBC
HCT: 37.6 % — ABNORMAL LOW (ref 39.0–52.0)
Hemoglobin: 11.9 g/dL — ABNORMAL LOW (ref 13.0–17.0)
MCH: 30.7 pg (ref 26.0–34.0)
MCHC: 31.6 g/dL (ref 30.0–36.0)
MCV: 97.2 fL (ref 80.0–100.0)
Platelets: 110 10*3/uL — ABNORMAL LOW (ref 150–400)
RBC: 3.87 MIL/uL — ABNORMAL LOW (ref 4.22–5.81)
RDW: 17.2 % — ABNORMAL HIGH (ref 11.5–15.5)
WBC: 6.1 10*3/uL (ref 4.0–10.5)
nRBC: 0 % (ref 0.0–0.2)

## 2024-02-26 LAB — BASIC METABOLIC PANEL
Anion gap: 17 — ABNORMAL HIGH (ref 5–15)
BUN: 69 mg/dL — ABNORMAL HIGH (ref 8–23)
CO2: 26 mmol/L (ref 22–32)
Calcium: 9.3 mg/dL (ref 8.9–10.3)
Chloride: 95 mmol/L — ABNORMAL LOW (ref 98–111)
Creatinine, Ser: 10.93 mg/dL — ABNORMAL HIGH (ref 0.61–1.24)
GFR, Estimated: 5 mL/min — ABNORMAL LOW (ref >60)
Glucose, Bld: 115 mg/dL — ABNORMAL HIGH (ref 70–99)
Potassium: 4.1 mmol/L (ref 3.5–5.1)
Sodium: 138 mmol/L (ref 135–145)

## 2024-02-26 LAB — GLUCOSE, CAPILLARY: Glucose-Capillary: 137 mg/dL — ABNORMAL HIGH (ref 70–99)

## 2024-02-26 LAB — PROTIME-INR
INR: 1.1 (ref 0.8–1.2)
Prothrombin Time: 14.9 s (ref 11.4–15.2)

## 2024-02-26 LAB — APTT: aPTT: 25 s (ref 24–36)

## 2024-02-26 MED ORDER — ONDANSETRON HCL 4 MG/2ML IJ SOLN: 4.0000 mg | Freq: Three times a day (TID) | INTRAMUSCULAR | Status: DC | PRN

## 2024-02-26 MED ORDER — HEPARIN SODIUM (PORCINE) 5000 UNIT/ML IJ SOLN
5000.0000 [IU] | Freq: Three times a day (TID) | INTRAMUSCULAR | Status: DC
  Administered 2024-02-26 – 2024-02-29 (×8): 5000 [IU] via SUBCUTANEOUS
  Filled 2024-02-26 (×9): qty 1

## 2024-02-26 MED ORDER — INSULIN ASPART 100 UNIT/ML IJ SOLN
0.0000 [IU] | Freq: Three times a day (TID) | INTRAMUSCULAR | Status: DC
  Administered 2024-02-28: 1 [IU] via SUBCUTANEOUS
  Filled 2024-02-26: qty 1

## 2024-02-26 MED ORDER — ACETAMINOPHEN 325 MG PO TABS: 650.0000 mg | ORAL_TABLET | Freq: Four times a day (QID) | ORAL | Status: DC | PRN

## 2024-02-26 MED ORDER — HYDRALAZINE HCL 20 MG/ML IJ SOLN: 5.0000 mg | INTRAMUSCULAR | Status: DC | PRN

## 2024-02-26 MED ORDER — INSULIN ASPART 100 UNIT/ML IJ SOLN
0.0000 [IU] | Freq: Every day | INTRAMUSCULAR | Status: DC
  Administered 2024-02-27: 2 [IU] via SUBCUTANEOUS
  Filled 2024-02-26: qty 1

## 2024-02-26 NOTE — ED Notes (Signed)
 Caretaker stated that she will pick up Pt when he's ready for discharge Lawrence [name] [phone]

## 2024-02-26 NOTE — ED Provider Notes (Signed)
 [facility] Provider Note    Event Date/Time   First MD Initiated Contact with Patient 02/26/24 1731     (approximate)   History   Chief Complaint fistula problem   HPI  Lawrence [name] is a 68 y.o. male with past medical history of hypertension, diabetes, ESRD on HD, stroke, and schizoaffective disorder who presents to the ED complaining of fistula problem.  Patient reports that he went to his regularly scheduled dialysis appointment earlier today, was told that his fistula was clogged and that he needed to have it fixed.  He last received a dialysis treatment 2 days ago, currently denies any symptoms including difficulty breathing.  He denies any pain or swelling around the fistula.  He initially went to the vascular surgery office, but was referred to the ED for further evaluation.     Physical Exam   Triage Vital Signs: ED Triage Vitals  Encounter Vitals Group     BP 02/26/24 1523 93/70     Systolic BP Percentile --      Diastolic BP Percentile --      Pulse Rate 02/26/24 1523 95     Resp 02/26/24 1523 17     Temp 02/26/24 1523 97.7 F (36.5 C)     Temp Source 02/26/24 1523 Oral     SpO2 02/26/24 1523 100 %     Weight 02/26/24 1520 154 lb 5.2 oz (70 kg)     Height 02/26/24 1520 5\' 7"  (1.702 m)     Head Circumference --      Peak Flow --      Pain Score 02/26/24 1520 0     Pain Loc --      Pain Education --      Exclude from Growth Chart --     Most recent vital signs: Vitals:   02/26/24 1523  BP: 93/70  Pulse: 95  Resp: 17  Temp: 97.7 F (36.5 C)  SpO2: 100%    Constitutional: Alert and oriented. Eyes: Conjunctivae are normal. Head: Atraumatic. Nose: No congestion/rhinnorhea. Mouth/Throat: Mucous membranes are moist.  Cardiovascular: Normal rate, regular rhythm. Grossly normal heart sounds.  2+ radial pulses bilaterally.  Left upper extremity AV fistula without palpable thrill. Respiratory: Normal respiratory effort.  No  retractions. Lungs CTAB. Gastrointestinal: Soft and nontender. No distention. Musculoskeletal: No lower extremity tenderness nor edema.  Neurologic:  Normal speech and language. No gross focal neurologic deficits are appreciated.    ED Results / Procedures / Treatments   Labs (all labs ordered are listed, but only abnormal results are displayed) Labs Reviewed  CBC - Abnormal; Notable for the following components:      Result Value   RBC 3.87 (*)    Hemoglobin 11.9 (*)    HCT 37.6 (*)    RDW 17.2 (*)    Platelets 110 (*)    All other components within normal limits  BASIC METABOLIC PANEL - Abnormal; Notable for the following components:   Chloride 95 (*)    Glucose, Bld 115 (*)    BUN 69 (*)    Creatinine, Ser 10.93 (*)    GFR, Estimated 5 (*)    Anion gap 17 (*)    All other components within normal limits    PROCEDURES:  Critical Care performed: No  Procedures   MEDICATIONS ORDERED IN ED: Medications - No data to display   IMPRESSION / MDM / ASSESSMENT AND PLAN / ED COURSE  I reviewed the triage  vital signs and the nursing notes.                              68 y.o. male with past medical history of hypertension, diabetes, stroke, ESRD on HD, and schizoaffective disorder who presents to the ED after he was not able to get any flow from his fistula at dialysis.  Patient's presentation is most consistent with acute presentation with potential threat to life or bodily function.  Differential diagnosis includes, but is not limited to, clotted fistula, hyperkalemia, pulmonary edema.  Patient nontoxic-appearing and in no acute distress, vital signs are unremarkable.  He is not in any respiratory distress and denies any complaints at this time.  Labs are reassuring with known ESRD but no associated hyperkalemia.  Anemia stable compared to previous with no significant leukocytosis.  Case discussed with Dr. Cherylann Ratel of nephrology, who recommends admission to the hospital for  declotting of the fistula.  Case discussed with hospitalist for admission.      FINAL CLINICAL IMPRESSION(S) / ED DIAGNOSES   Final diagnoses:  Malfunction of arteriovenous dialysis fistula, initial encounter (HCC)  ESRD on hemodialysis (HCC)     Rx / DC Orders   ED Discharge Orders     None        Note:  This document was prepared using Dragon voice recognition software and may include unintentional dictation errors.   Chesley Noon, MD 02/26/24 [phone]

## 2024-02-26 NOTE — H&P (Signed)
 History and Physical    Lawrence [name] ZOX:[id number] DOB: May 11, 1956 DOA: 02/26/2024  Referring MD/NP/PA:   PCP: [name], MD   Patient coming from:  The patient is coming from home.     Chief Complaint: AV fistula occlusion  HPI: Lawrence [name] is a 68 y.o. male with medical history significant of ESRD-HD (MWF), FSGS, HTN, HLD, DM, stroke, ICH, thrombocytopenia, who presents with AV fistula occlusion in left arm.  Pt states that he went to his regularly scheduled dialysis appointment earlier today, was told that his fistula was clogged and that he needed to have it fixed. He last received a dialysis treatment 2 days ago on Wednesday.  Patient does not have pain in fistula area in the left arm.  Patient does not have chest pain, cough, SOB.  No fever or chills.  No nausea, vomiting, diarrhea or abdominal pain.  No symptoms of UTI.   Data reviewed independently and ED Course: pt was found to have potassium 4.1, bicarbonate 26, creatinine 10.93, BUN 69, WBC 6.1, temperature normal, blood pressure 93/70, heart rate 95, RR 17, oxygen saturation 100% on room air.  Patient is admit to MedSurg bed as inpt. C06ons.  lted with Dr. Imogene Burn of [facility] and Dr. Micki Riley of renal.   EKG:  Not done in ED, will get one.    Review of Systems:   General: no fevers, chills, no body weight gain, fatigue HEENT: no blurry vision, hearing changes or sore throat Respiratory: no dyspnea, coughing, wheezing CV: no chest pain, no palpitations GI: no nausea, vomiting, abdominal pain, diarrhea, constipation GU: no dysuria, burning on urination, increased urinary frequency, hematuria  Ext: no leg edema Neuro: no unilateral weakness, numbness, or tingling, no vision change or hearing loss Skin: no rash, no skin tear. MSK: No muscle spasm, no deformity, no limitation of range of movement in spin Heme: No easy bruising.  Travel history: No recent long distant travel.   Allergy:  Allergies  Allergen  Reactions   Nsaids Other (See Comments)    Cannot take due to renal disease    Past Medical History:  Diagnosis Date   Anemia    Cerebral hemorrhage (HCC) 2012   Chronic constipation    Chronic kidney disease    Diabetes mellitus without complication (HCC)    Hemodialysis patient (HCC)    Hyperlipidemia    Hypertension    Schizophrenia (HCC)    Stroke [facility])     Past Surgical History:  Procedure Laterality Date   A/V FISTULAGRAM Left 02/24/2017   Procedure: A/V Fistulagram;  Surgeon: Renford Dills, MD;  Location: [facility];  Service: Cardiovascular;  Laterality: Left;   A/V FISTULAGRAM Left 09/03/2020   Procedure: A/V FISTULAGRAM;  Surgeon: Annice Needy, MD;  Location: [facility];  Service: Cardiovascular;  Laterality: Left;   A/V FISTULAGRAM Left 06/27/2021   Procedure: A/V FISTULAGRAM;  Surgeon: Annice Needy, MD;  Location: [facility];  Service: Cardiovascular;  Laterality: Left;   A/V FISTULAGRAM Left 07/24/2021   Procedure: A/V FISTULAGRAM;  Surgeon: Annice Needy, MD;  Location: [facility];  Service: Cardiovascular;  Laterality: Left;   A/V FISTULAGRAM Left 03/25/2022   Procedure: A/V Fistulagram;  Surgeon: Bertram Denver, MD;  Location: [facility];  Service: Cardiovascular;  Laterality: Left;   A/V FISTULAGRAM N/A 06/12/2022   Procedure: A/V Fistulagram;  Surgeon: Renford Dills, MD;  Location: [facility];  Service: Cardiovascular;  Laterality: N/A;   DIALYSIS/PERMA CATHETER INSERTION N/A 12/03/2021   Procedure: DIALYSIS/PERMA CATHETER INSERTION;  Surgeon: Annice Needy, MD;  Location: [facility];  Service: Cardiovascular;  Laterality: N/A;   DIALYSIS/PERMA CATHETER INSERTION N/A 03/27/2022   Procedure: DIALYSIS/PERMA CATHETER INSERTION;  Surgeon: Annice Needy, MD;  Location: [facility];  Service: Cardiovascular;  Laterality: N/A;   DIALYSIS/PERMA CATHETER REMOVAL N/A 03/13/2022   Procedure:  DIALYSIS/PERMA CATHETER REMOVAL;  Surgeon: Annice Needy, MD;  Location: [facility];  Service: Cardiovascular;  Laterality: N/A;   PERIPHERAL VASCULAR CATHETERIZATION N/A 04/17/2015   Procedure: Dialysis/Perma Catheter Insertion;  Surgeon: Renford Dills, MD;  Location: [facility];  Service: Cardiovascular;  Laterality: N/A;   PERIPHERAL VASCULAR CATHETERIZATION Left 07/30/2015   Procedure: A/V Shuntogram/Fistulagram;  Surgeon: Annice Needy, MD;  Location: [facility];  Service: Cardiovascular;  Laterality: Left;   PERIPHERAL VASCULAR CATHETERIZATION Left 07/30/2015   Procedure: A/V Shunt Intervention;  Surgeon: Annice Needy, MD;  Location: [facility];  Service: Cardiovascular;  Laterality: Left;   PERIPHERAL VASCULAR CATHETERIZATION N/A 08/20/2015   Procedure: DIALYSIS/PERMA CATHETER REMOVAL;  Surgeon: Annice Needy, MD;  Location: [facility];  Service: Cardiovascular;  Laterality: N/A;   PERIPHERAL VASCULAR THROMBECTOMY Left 03/27/2022   Procedure: PERIPHERAL VASCULAR THROMBECTOMY;  Surgeon: Annice Needy, MD;  Location: [facility];  Service: Cardiovascular;  Laterality: Left;   PERIPHERAL VASCULAR THROMBECTOMY Left 08/04/2022   Procedure: PERIPHERAL VASCULAR THROMBECTOMY;  Surgeon: Annice Needy, MD;  Location: [facility];  Service: Cardiovascular;  Laterality: Left;   PERIPHERAL VASCULAR THROMBECTOMY Left 09/01/2023   Procedure: PERIPHERAL VASCULAR THROMBECTOMY;  Surgeon: Annice Needy, MD;  Location: [facility];  Service: Cardiovascular;  Laterality: Left;   REVISON OF ARTERIOVENOUS FISTULA Left 12/04/2021   Procedure: REVISON OF ARTERIOVENOUS FISTULA;  Surgeon: Annice Needy, MD;  Location: [facility];  Service: Vascular;  Laterality: Left;   TEMPORARY DIALYSIS CATHETER N/A 06/11/2022   Procedure: TEMPORARY DIALYSIS CATHETER;  Surgeon: Renford Dills, MD;  Location: [facility];  Service: Cardiovascular;  Laterality:  N/A;   THROMBECTOMY W/ EMBOLECTOMY Left 12/04/2021   Procedure: THROMBECTOMY ARTERIOVENOUS FISTULA;  Surgeon: Annice Needy, MD;  Location: [facility];  Service: Vascular;  Laterality: Left;   VASCULAR SURGERY     Fistula placement    Social History:  reports that he has never smoked. He has never used smokeless tobacco. He reports that he does not drink alcohol and does not use drugs.  Family History:  Family History  Problem Relation Age of Onset   Diabetes Mother    Kidney cancer Mother    Diabetes Sister    Stroke Brother    Prostate cancer Neg Hx    Bladder Cancer Neg Hx      Prior to Admission medications   Medication Sig Start Date End Date Taking? Authorizing Provider  amLODipine (NORVASC) 10 MG tablet Take 10 mg by mouth daily. 02/17/15   [provider]  aspirin EC 81 MG tablet Take 81 mg by mouth daily.     [provider]  benztropine (COGENTIN) 0.5 MG tablet Take 0.5 mg by mouth 2 (two) times daily.    [provider]  carvedilol (COREG) 25 MG tablet Take 25 mg by mouth 2 (two) times daily with a meal. 04/04/22   [provider]  cetirizine (ZYRTEC) 10  MG tablet Take 10 mg by mouth every other day.    [provider]  Cholecalciferol (VITAMIN D3) 125 MCG (5000 UT) CAPS Take 5,000 Units by mouth once a week.    [provider]  Coenzyme Q10 100 MG capsule Take 100 mg by mouth at bedtime.    [provider]  diphenhydrAMINE (BENADRYL) 25 mg capsule Take 25 mg by mouth See admin instructions. Take 1 capsule (25mg ) by mouth nightly as needed for sleep - may take 1 capsule (25mg ) by mouth during the daytime as needed for itching    [provider]  DIPHENHYDRAMINE-ZINC OXIDE EX Apply 1 application. topically See admin instructions. Apply small amount of affected area three times daily as needed    [provider]  docusate sodium (COLACE) 100 MG capsule Take 100 mg by mouth 2 (two) times daily.     [provider]  docusate sodium (COLACE) 100 MG capsule Take 200 mg by mouth daily as needed for mild constipation or moderate constipation.    [provider]  [facility] TEST test strip EasyMax 06/10/22   [provider]  feeding supplement (BOOST HIGH PROTEIN) LIQD Take 1 Container by mouth See admin instructions. Take 1 container by mouth three times a week or as needed of Nutritional Supplement    [provider]  fluticasone (FLONASE) 50 MCG/ACT nasal spray Place 2 sprays into both nostrils daily.    [provider]  glipiZIDE (GLUCOTROL XL) 2.5 MG 24 hr tablet Take 2.5 mg by mouth daily with breakfast.     [provider]  haloperidol (HALDOL) 10 MG tablet Take 5-10 mg by mouth See admin instructions. Take 5 mg in the morning and 10 mg at bedtime    [provider]  lidocaine-prilocaine (EMLA) cream Apply 1 application topically 3 (three) times a week. Apply over dialysis shunt 30 minutes prior to dialysis.    [provider]  midodrine (PROAMATINE) 5 MG tablet Take 5 mg by mouth 3 (three) times a week. (Take 1 hour prior to dialysis)    [provider]  mirtazapine (REMERON) 15 MG tablet Take 15 mg by mouth at bedtime.    [provider]  niacin (NIASPAN) 1000 MG CR tablet Take 1,000 mg by mouth at bedtime.     [provider]  polyethylene glycol (MIRALAX / GLYCOLAX) 17 g packet Take 17 g by mouth daily as needed (Constipation). Mix 17 g (1 capful) in to 8 ounces of Juice/Water    [provider]  pravastatin (PRAVACHOL) 40 MG tablet Take 40 mg by mouth at bedtime.     [provider]  sevelamer carbonate (RENVELA) 800 MG tablet Take 2 tablets (1,600 mg total) by mouth 3 (three) times daily with meals. 12/05/21   Azucena Fallen, MD    Physical Exam: Vitals:   02/26/24 1857 02/26/24 1900 02/26/24 2000 02/26/24 2031  BP: 117/79 103/75 117/76 112/75  Pulse: 86 95 88 90   Resp: 15 11 17    Temp:   97.7 F (36.5 C) (!) 97.4 F (36.3 C)  TempSrc:   Oral Oral  SpO2: 100% 100% 100% 100%  Weight:      Height:       General: Not in acute distress HEENT:       Eyes: PERRL, EOMI, no jaundice       ENT: No discharge from the ears and nose, no pharynx injection, no tonsillar enlargement.  Neck: No JVD, no bruit, no mass felt. Heme: No neck lymph node enlargement. Cardiac: S1/S2, RRR, No murmurs, No gallops or rubs. Respiratory: No rales, wheezing, rhonchi or rubs. GI: Soft, nondistended, nontender, no rebound pain, no organomegaly, BS present. GU: No hematuria Ext: No pitting leg edema bilaterally. 1+DP/PT pulse bilaterally. Has AVF in left arm without thrill Musculoskeletal: No joint deformities, No joint redness or warmth, no limitation of ROM in spin. Skin: No rashes.  Neuro: Alert, oriented X3, cranial nerves II-XII grossly intact, moves all extremities normally. Psych: Patient is not psychotic, no suicidal or hemocidal ideation.  Labs on Admission: I have personally reviewed following labs and imaging studies  CBC: Recent Labs  Lab 02/26/24 1523  WBC 6.1  HGB 11.9*  HCT 37.6*  MCV 97.2  PLT 110*   Basic Metabolic Panel: Recent Labs  Lab 02/26/24 1523  NA 138  K 4.1  CL 95*  CO2 26  GLUCOSE 115*  BUN 69*  CREATININE 10.93*  CALCIUM 9.3   GFR: Estimated Creatinine Clearance: 6.1 mL/min (A) (by C-G formula based on SCr of 10.93 mg/dL (H)). Liver Function Tests: No results for input(s): "AST", "ALT", "ALKPHOS", "BILITOT", "PROT", "ALBUMIN" in the last 168 hours. No results for input(s): "LIPASE", "AMYLASE" in the last 168 hours. No results for input(s): "AMMONIA" in the last 168 hours. Coagulation Profile: Recent Labs  Lab 02/26/24 1853  INR 1.1   Cardiac Enzymes: No results for input(s): "CKTOTAL", "CKMB", "CKMBINDEX", "TROPONINI" in the last 168 hours. BNP (last 3 results) No results for input(s): "PROBNP" in the last  8760 hours. HbA1C: No results for input(s): "HGBA1C" in the last 72 hours. CBG: Recent Labs  Lab 02/26/24 2115  GLUCAP 137*   Lipid Profile: No results for input(s): "CHOL", "HDL", "LDLCALC", "TRIG", "CHOLHDL", "LDLDIRECT" in the last 72 hours. Thyroid Function Tests: No results for input(s): "TSH", "T4TOTAL", "FREET4", "T3FREE", "THYROIDAB" in the last 72 hours. Anemia Panel: No results for input(s): "VITAMINB12", "FOLATE", "FERRITIN", "TIBC", "IRON", "RETICCTPCT" in the last 72 hours. Urine analysis:    Component Value Date/Time   COLORURINE BROWN (A) 10/13/2017 1703   APPEARANCEUR HAZY (A) 10/13/2017 1703   APPEARANCEUR Clear 12/13/2014 1354   LABSPEC 1.020 10/13/2017 1703   LABSPEC 1.006 12/13/2014 1354   PHURINE 8.5 (H) 10/13/2017 1703   GLUCOSEU 250 (A) 10/13/2017 1703   GLUCOSEU 50 mg/dL 69/62/9528 4132   HGBUR LARGE (A) 10/13/2017 1703   BILIRUBINUR MODERATE (A) 10/13/2017 1703   BILIRUBINUR Negative 12/13/2014 1354   KETONESUR TRACE (A) 10/13/2017 1703   PROTEINUR >300 (A) 10/13/2017 1703   NITRITE POSITIVE (A) 10/13/2017 1703   LEUKOCYTESUR LARGE (A) 10/13/2017 1703   LEUKOCYTESUR Negative 12/13/2014 1354   Sepsis Labs: @LABRCNTIP (procalcitonin:4,lacticidven:4) )No results found for this or any previous visit (from the past 240 hours).   Radiological Exams on Admission:   Assessment/Plan Principal Problem:   AV fistula occlusion, initial encounter [facility]) Active Problems:   ESRD on dialysis [facility])   Essential hypertension   Type II diabetes mellitus with renal manifestations (HCC)   Cerebrovascular accident (CVA) (HCC)   Dyslipidemia   Thrombocytopenia, unspecified (HCC)   Chronic schizoaffective disorder (HCC)   Assessment and Plan:  AV fistula occlusion, initial encounter [facility]): Consulted Dr. Imogene Burn of [facility]. Pt is on schedule for Monday for thrombectomy per Dr. Imogene Burn   -Admitted to MedSurg bed as inpatient. -check INR/PT -watch any signs of fluid  overload closely  ESRD on dialysis (MWF): No signs of fluid overload.  Oxygen saturation 100% on room air. Color-Follow Dr. Suezanne Jacquet of renal  Essential hypertension: Blood pressure soft at 93/70 -IV hydralazine as needed -Hold amlodipine and Coreg  Type II diabetes mellitus with renal manifestations [facility]): Recent A1c 4.9, well-controlled.  Patient is taking glipizide -Sliding scale insulin  Cerebrovascular accident (CVA) (HCC) -Aspirin, pravastatin  Dyslipidemia -Pravastatin  Thrombocytopenia, unspecified (HCC): This is chronic issue.  Platelets are 110 -Follow-up with CBC  Chronic schizoaffective disorder [facility] [place]): Patient is calm now. -Continue home Haldol, Remeron, benztropine        DVT ppx: SQ Heparin    Code Status: Full code    Family Communication:     not done, no family member is at bed side.           Disposition Plan:  Anticipate discharge back to previous environment  Consults called:  Consulted with Dr. Imogene Burn of [facility] and Dr. Micki Riley of renal.   Admission status and Level of care: Med-Surg:   as inpt     Dispo: The patient is from: Home              Anticipated d/c is to: Home              Anticipated d/c date is: 2 days              Patient currently is not medically stable to d/c.    Severity of Illness:  The appropriate patient status for this patient is INPATIENT. Inpatient status is judged to be reasonable and necessary in order to provide the required intensity of service to ensure the patient's safety. The patient's presenting symptoms, physical exam findings, and initial radiographic and laboratory data in the context of their chronic comorbidities is felt to place them at high risk for further clinical deterioration. Furthermore, it is not anticipated that the patient will be medically stable for discharge from the hospital within 2 midnights of admission.   * I certify that at the point of admission it is my clinical judgment that the  patient will require inpatient hospital care spanning beyond 2 midnights from the point of admission due to high intensity of service, high risk for further deterioration and high frequency of surveillance required.*          Date of Service 02/27/2024    Lorretta Harp [facility]   If 7PM-7AM, please contact night-coverage [email or web address] 02/27/2024, 12:34 AM

## 2024-02-26 NOTE — ED Triage Notes (Addendum)
 Pt here from care home- fistula clogged and was unable to have dialysis today, last treatment was Wednesday.  Denies other complaints.

## 2024-02-27 ENCOUNTER — Encounter
Admission: EM | Disposition: A | Discharge status: Home / Self Care | Payer: Self-pay | Source: Home / Self Care | Attending: Internal Medicine

## 2024-02-27 ENCOUNTER — Inpatient Hospital Stay: Admitting: Anesthesiology

## 2024-02-27 ENCOUNTER — Other Ambulatory Visit: Payer: Self-pay

## 2024-02-27 DIAGNOSIS — D696 Thrombocytopenia, unspecified: Secondary | ICD-10-CM | POA: Diagnosis present

## 2024-02-27 DIAGNOSIS — T82868A Thrombosis of vascular prosthetic devices, implants and grafts, initial encounter: Secondary | ICD-10-CM | POA: Diagnosis present

## 2024-02-27 DIAGNOSIS — Z992 Dependence on renal dialysis: Secondary | ICD-10-CM

## 2024-02-27 DIAGNOSIS — I871 Compression of vein: Secondary | ICD-10-CM | POA: Diagnosis not present

## 2024-02-27 DIAGNOSIS — E1122 Type 2 diabetes mellitus with diabetic chronic kidney disease: Secondary | ICD-10-CM | POA: Diagnosis present

## 2024-02-27 DIAGNOSIS — Z823 Family history of stroke: Secondary | ICD-10-CM | POA: Diagnosis not present

## 2024-02-27 DIAGNOSIS — Z794 Long term (current) use of insulin: Secondary | ICD-10-CM | POA: Diagnosis not present

## 2024-02-27 DIAGNOSIS — Z6824 Body mass index (BMI) 24.0-24.9, adult: Secondary | ICD-10-CM | POA: Diagnosis not present

## 2024-02-27 DIAGNOSIS — Z91158 Patient's noncompliance with renal dialysis for other reason: Secondary | ICD-10-CM | POA: Diagnosis not present

## 2024-02-27 DIAGNOSIS — D631 Anemia in chronic kidney disease: Secondary | ICD-10-CM | POA: Diagnosis present

## 2024-02-27 DIAGNOSIS — T82898A Other specified complication of vascular prosthetic devices, implants and grafts, initial encounter: Secondary | ICD-10-CM | POA: Diagnosis not present

## 2024-02-27 DIAGNOSIS — E663 Overweight: Secondary | ICD-10-CM | POA: Diagnosis present

## 2024-02-27 DIAGNOSIS — Z886 Allergy status to analgesic agent status: Secondary | ICD-10-CM | POA: Diagnosis not present

## 2024-02-27 DIAGNOSIS — Z7982 Long term (current) use of aspirin: Secondary | ICD-10-CM | POA: Diagnosis not present

## 2024-02-27 DIAGNOSIS — I10 Essential (primary) hypertension: Secondary | ICD-10-CM | POA: Diagnosis not present

## 2024-02-27 DIAGNOSIS — Z8051 Family history of malignant neoplasm of kidney: Secondary | ICD-10-CM | POA: Diagnosis not present

## 2024-02-27 DIAGNOSIS — Z8673 Personal history of transient ischemic attack (TIA), and cerebral infarction without residual deficits: Secondary | ICD-10-CM | POA: Diagnosis not present

## 2024-02-27 DIAGNOSIS — Y832 Surgical operation with anastomosis, bypass or graft as the cause of abnormal reaction of the patient, or of later complication, without mention of misadventure at the time of the procedure: Secondary | ICD-10-CM | POA: Diagnosis present

## 2024-02-27 DIAGNOSIS — Z79899 Other long term (current) drug therapy: Secondary | ICD-10-CM | POA: Diagnosis not present

## 2024-02-27 DIAGNOSIS — E785 Hyperlipidemia, unspecified: Secondary | ICD-10-CM | POA: Diagnosis present

## 2024-02-27 DIAGNOSIS — N2581 Secondary hyperparathyroidism of renal origin: Secondary | ICD-10-CM | POA: Diagnosis present

## 2024-02-27 DIAGNOSIS — I120 Hypertensive chronic kidney disease with stage 5 chronic kidney disease or end stage renal disease: Secondary | ICD-10-CM | POA: Diagnosis present

## 2024-02-27 DIAGNOSIS — Z1152 Encounter for screening for COVID-19: Secondary | ICD-10-CM | POA: Diagnosis not present

## 2024-02-27 DIAGNOSIS — Z833 Family history of diabetes mellitus: Secondary | ICD-10-CM | POA: Diagnosis not present

## 2024-02-27 DIAGNOSIS — N186 End stage renal disease: Secondary | ICD-10-CM | POA: Diagnosis present

## 2024-02-27 DIAGNOSIS — Z7984 Long term (current) use of oral hypoglycemic drugs: Secondary | ICD-10-CM | POA: Diagnosis not present

## 2024-02-27 DIAGNOSIS — F259 Schizoaffective disorder, unspecified: Secondary | ICD-10-CM | POA: Diagnosis present

## 2024-02-27 HISTORY — PX: INSERTION OF DIALYSIS CATHETER: SHX1324

## 2024-02-27 LAB — GLUCOSE, CAPILLARY
Glucose-Capillary: 104 mg/dL — ABNORMAL HIGH (ref 70–99)
Glucose-Capillary: 89 mg/dL (ref 70–99)
Glucose-Capillary: 98 mg/dL (ref 70–99)
Glucose-Capillary: 204 mg/dL — ABNORMAL HIGH (ref 70–99)

## 2024-02-27 LAB — RENAL FUNCTION PANEL
Albumin: 3.2 g/dL — ABNORMAL LOW (ref 3.5–5.0)
Anion gap: 8 (ref 5–15)
BUN: 81 mg/dL — ABNORMAL HIGH (ref 8–23)
CO2: 25 mmol/L (ref 22–32)
Calcium: 8.8 mg/dL — ABNORMAL LOW (ref 8.9–10.3)
Chloride: 97 mmol/L — ABNORMAL LOW (ref 98–111)
Creatinine, Ser: 12.48 mg/dL — ABNORMAL HIGH (ref 0.61–1.24)
GFR, Estimated: 4 mL/min — ABNORMAL LOW (ref >60)
Glucose, Bld: 100 mg/dL — ABNORMAL HIGH (ref 70–99)
Phosphorus: 4.2 mg/dL (ref 2.5–4.6)
Potassium: 4.6 mmol/L (ref 3.5–5.1)
Sodium: 130 mmol/L — ABNORMAL LOW (ref 135–145)

## 2024-02-27 LAB — CBC
HCT: 32.8 % — ABNORMAL LOW (ref 39.0–52.0)
HCT: 33.5 % — ABNORMAL LOW (ref 39.0–52.0)
Hemoglobin: 10.8 g/dL — ABNORMAL LOW (ref 13.0–17.0)
Hemoglobin: 10.8 g/dL — ABNORMAL LOW (ref 13.0–17.0)
MCH: 30.9 pg (ref 26.0–34.0)
MCH: 30.3 pg (ref 26.0–34.0)
MCHC: 32.9 g/dL (ref 30.0–36.0)
MCHC: 32.2 g/dL (ref 30.0–36.0)
MCV: 94 fL (ref 80.0–100.0)
MCV: 93.8 fL (ref 80.0–100.0)
Platelets: 96 10*3/uL — ABNORMAL LOW (ref 150–400)
Platelets: 94 10*3/uL — ABNORMAL LOW (ref 150–400)
RBC: 3.49 MIL/uL — ABNORMAL LOW (ref 4.22–5.81)
RBC: 3.57 MIL/uL — ABNORMAL LOW (ref 4.22–5.81)
RDW: 16.8 % — ABNORMAL HIGH (ref 11.5–15.5)
RDW: 16.5 % — ABNORMAL HIGH (ref 11.5–15.5)
WBC: 5.3 10*3/uL (ref 4.0–10.5)
WBC: 4.6 10*3/uL (ref 4.0–10.5)
nRBC: 0 % (ref 0.0–0.2)
nRBC: 0 % (ref 0.0–0.2)

## 2024-02-27 LAB — HIV ANTIBODY (ROUTINE TESTING W REFLEX): HIV Screen 4th Generation wRfx: NONREACTIVE

## 2024-02-27 LAB — BASIC METABOLIC PANEL
Anion gap: 16 — ABNORMAL HIGH (ref 5–15)
BUN: 78 mg/dL — ABNORMAL HIGH (ref 8–23)
CO2: 23 mmol/L (ref 22–32)
Calcium: 9 mg/dL (ref 8.9–10.3)
Chloride: 94 mmol/L — ABNORMAL LOW (ref 98–111)
Creatinine, Ser: 11.52 mg/dL — ABNORMAL HIGH (ref 0.61–1.24)
GFR, Estimated: 4 mL/min — ABNORMAL LOW (ref >60)
Glucose, Bld: 71 mg/dL (ref 70–99)
Potassium: 4.3 mmol/L (ref 3.5–5.1)
Sodium: 133 mmol/L — ABNORMAL LOW (ref 135–145)

## 2024-02-27 LAB — HEPATITIS B SURFACE ANTIGEN: Hepatitis B Surface Ag: NONREACTIVE

## 2024-02-27 SURGERY — INSERTION OF DIALYSIS CATHETER
Anesthesia: Monitor Anesthesia Care | Site: Groin | Laterality: Right

## 2024-02-27 MED ORDER — BENZTROPINE MESYLATE 0.5 MG PO TABS
0.5000 mg | ORAL_TABLET | Freq: Two times a day (BID) | ORAL | Status: DC
  Administered 2024-02-27 – 2024-02-29 (×6): 0.5 mg via ORAL
  Filled 2024-02-27 (×6): qty 1

## 2024-02-27 MED ORDER — LIDOCAINE HCL 1 % IJ SOLN
INTRAMUSCULAR | Status: DC | PRN
  Administered 2024-02-27 (×2): 5 mL

## 2024-02-27 MED ORDER — 0.9 % SODIUM CHLORIDE (POUR BTL) OPTIME
TOPICAL | Status: DC | PRN
  Administered 2024-02-27: 500 mL

## 2024-02-27 MED ORDER — SEVELAMER CARBONATE 800 MG PO TABS
1600.0000 mg | ORAL_TABLET | Freq: Three times a day (TID) | ORAL | Status: DC
  Administered 2024-02-28 – 2024-02-29 (×4): 1600 mg via ORAL
  Filled 2024-02-27 (×5): qty 2

## 2024-02-27 MED ORDER — SODIUM CHLORIDE 0.9 % IV SOLN: INTRAVENOUS | Status: DC | PRN

## 2024-02-27 MED ORDER — HEPARIN SODIUM (PORCINE) 1000 UNIT/ML IJ SOLN
INTRAMUSCULAR | Status: AC
  Filled 2024-02-27: qty 10

## 2024-02-27 MED ORDER — MIDAZOLAM HCL 2 MG/2ML IJ SOLN
INTRAMUSCULAR | Status: AC
  Filled 2024-02-27: qty 2

## 2024-02-27 MED ORDER — HALOPERIDOL 5 MG PO TABS
5.0000 mg | ORAL_TABLET | Freq: Every day | ORAL | Status: DC
  Administered 2024-02-27 – 2024-02-29 (×3): 5 mg via ORAL
  Filled 2024-02-27 (×3): qty 1

## 2024-02-27 MED ORDER — MIDAZOLAM HCL 2 MG/2ML IJ SOLN
INTRAMUSCULAR | Status: DC | PRN
  Administered 2024-02-27: 1 mg via INTRAVENOUS

## 2024-02-27 MED ORDER — LIDOCAINE HCL (PF) 1 % IJ SOLN: 5.0000 mL | INTRAMUSCULAR | Status: DC | PRN

## 2024-02-27 MED ORDER — FENTANYL CITRATE (PF) 100 MCG/2ML IJ SOLN
INTRAMUSCULAR | Status: DC | PRN
  Administered 2024-02-27 (×2): 25 ug via INTRAVENOUS
  Administered 2024-02-27: 50 ug via INTRAVENOUS

## 2024-02-27 MED ORDER — LORATADINE 10 MG PO TABS
10.0000 mg | ORAL_TABLET | ORAL | Status: DC
  Administered 2024-02-27: 10 mg via ORAL
  Filled 2024-02-27: qty 1

## 2024-02-27 MED ORDER — HEPARIN SOD (PORK) LOCK FLUSH 100 UNIT/ML IV SOLN
INTRAVENOUS | Status: AC
  Filled 2024-02-27: qty 5

## 2024-02-27 MED ORDER — HEPARIN SODIUM (PORCINE) 1000 UNIT/ML DIALYSIS: 1000.0000 [IU] | INTRAMUSCULAR | Status: DC | PRN

## 2024-02-27 MED ORDER — ALTEPLASE 2 MG IJ SOLR: 2.0000 mg | Freq: Once | INTRAMUSCULAR | Status: DC | PRN

## 2024-02-27 MED ORDER — HALOPERIDOL 5 MG PO TABS
5.0000 mg | ORAL_TABLET | ORAL | Status: DC
Start: 2024-02-27 — End: 2024-02-27

## 2024-02-27 MED ORDER — CHLORHEXIDINE GLUCONATE CLOTH 2 % EX PADS
6.0000 | MEDICATED_PAD | Freq: Every day | CUTANEOUS | Status: DC
  Administered 2024-02-28 – 2024-02-29 (×2): 6 via TOPICAL

## 2024-02-27 MED ORDER — PENTAFLUOROPROP-TETRAFLUOROETH EX AERO: 1.0000 | INHALATION_SPRAY | CUTANEOUS | Status: DC | PRN

## 2024-02-27 MED ORDER — DOCUSATE SODIUM 100 MG PO CAPS: 200.0000 mg | ORAL_CAPSULE | Freq: Every day | ORAL | Status: DC | PRN

## 2024-02-27 MED ORDER — ANTICOAGULANT SODIUM CITRATE 4% (200MG/5ML) IV SOLN: 5.0000 mL | Status: DC | PRN

## 2024-02-27 MED ORDER — ASPIRIN 81 MG PO TBEC
81.0000 mg | DELAYED_RELEASE_TABLET | Freq: Every day | ORAL | Status: DC
  Administered 2024-02-27 – 2024-02-28 (×2): 81 mg via ORAL
  Filled 2024-02-27 (×2): qty 1

## 2024-02-27 MED ORDER — MIRTAZAPINE 15 MG PO TABS
15.0000 mg | ORAL_TABLET | Freq: Every day | ORAL | Status: DC
  Administered 2024-02-27 – 2024-02-28 (×3): 15 mg via ORAL
  Filled 2024-02-27 (×3): qty 1

## 2024-02-27 MED ORDER — LIDOCAINE-PRILOCAINE 2.5-2.5 % EX CREA: 1.0000 | TOPICAL_CREAM | CUTANEOUS | Status: DC | PRN

## 2024-02-27 MED ORDER — LIDOCAINE HCL (PF) 1 % IJ SOLN
INTRAMUSCULAR | Status: AC
  Filled 2024-02-27: qty 30

## 2024-02-27 MED ORDER — HEPARIN SOD (PORK) LOCK FLUSH 100 UNIT/ML IV SOLN
INTRAVENOUS | Status: AC
Start: 2024-02-27 — End: ?
  Filled 2024-02-27: qty 10

## 2024-02-27 MED ORDER — HALOPERIDOL 5 MG PO TABS
10.0000 mg | ORAL_TABLET | Freq: Every day | ORAL | Status: DC
  Administered 2024-02-27 – 2024-02-28 (×3): 10 mg via ORAL
  Filled 2024-02-27 (×4): qty 2

## 2024-02-27 MED ORDER — FENTANYL CITRATE (PF) 100 MCG/2ML IJ SOLN
INTRAMUSCULAR | Status: AC
  Filled 2024-02-27: qty 2

## 2024-02-27 MED ORDER — PRAVASTATIN SODIUM 20 MG PO TABS
40.0000 mg | ORAL_TABLET | Freq: Every day | ORAL | Status: DC
  Administered 2024-02-27 – 2024-02-28 (×2): 40 mg via ORAL
  Filled 2024-02-27 (×2): qty 2

## 2024-02-27 SURGICAL SUPPLY — 10 items
BASIN GRAD PLASTIC 32OZ STRL (MISCELLANEOUS) IMPLANT
CNTNR URN SCR LID CUP LEK RST (MISCELLANEOUS) IMPLANT
COVER PROBE W GEL 5X96 (DRAPES) ×1 IMPLANT
GAUZE 4X4 16PLY ~~LOC~~+RFID DBL (SPONGE) ×2 IMPLANT
GLOVE BIOGEL PI IND STRL 7.5 (GLOVE) ×1 IMPLANT
GOWN STRL REUS W/ TWL LRG LVL3 (GOWN DISPOSABLE) ×2 IMPLANT
KIT DIALYSIS CATH TRI 30X13 (CATHETERS) IMPLANT
NS IRRIG 1000ML POUR BTL (IV SOLUTION) ×1 IMPLANT
SPIKE FLUID TRANSFER (MISCELLANEOUS) IMPLANT
SYR CONTROL 10ML LL (SYRINGE) ×1 IMPLANT

## 2024-02-27 NOTE — Progress Notes (Signed)
  Progress Note   Patient: Lawrence [name]:[id number] DOB: Jun 14, 1956 DOA: 02/26/2024     0 DOS: the patient was seen and examined on 02/27/2024   Brief hospital course: Lawrence [name] is a 68 y.o. male with medical history significant of ESRD-HD (MWF), FSGS, HTN, HLD, DM, stroke, ICH, thrombocytopenia, who presents with AV fistula occlusion in left arm.  Patient is seen by nephrology and vascular surgery, a temporary dialysis catheter be placed today and start dialysis.   Principal Problem:   AV fistula occlusion, initial encounter (HCC) Active Problems:   ESRD on dialysis [facility])   Essential hypertension   Type II diabetes mellitus with renal manifestations (HCC)   Cerebrovascular accident (CVA) (HCC)   Dyslipidemia   Thrombocytopenia, unspecified (HCC)   Chronic schizoaffective disorder (HCC)   End stage renal disease (HCC)  AV fistula occlusion, initial encounter (HCC):  ESRD on dialysis (MWF):  Patient missed dialysis yesterday due to AV fistula malfunction, discussed with vascular surgery and nephrology, a temporary dialysis catheter will be performed today, patient will start dialysis. Formal vascular procedure will be performed on Monday.   Essential hypertension:  Pressure not significant elevated, medication on hold.    Type II diabetes mellitus with renal manifestations [facility]):  Well-controlled, continue sliding scale insulin.   Cerebrovascular accident (CVA) (HCC) -Aspirin, pravastatin   Dyslipidemia -Pravastatin   Thrombocytopenia, unspecified (HCC):  Chronic.   Chronic schizoaffective disorder [facility]): Patient is calm now. -Continue home Haldol, Remeron, benztropine        Subjective:  Patient doing well today, no nausea vomiting.  No shortness of breath.  Physical Exam: Vitals:   02/26/24 2031 02/27/24 0331 02/27/24 0500 02/27/24 0759  BP: 112/75 106/70  102/70  Pulse: 90 93  (!) 102  Resp:  18  18  Temp: (!) 97.4 F (36.3 C) 98.4 F (36.9  C)  98.2 F (36.8 C)  TempSrc: Oral Oral  Oral  SpO2: 100% 100%  98%  Weight:   71.2 kg   Height:       General exam: Appears calm and comfortable  Respiratory system: Clear to auscultation. Respiratory effort normal. Cardiovascular system: S1 & S2 heard, RRR. No JVD, murmurs, rubs, gallops or clicks. No pedal edema. Gastrointestinal system: Abdomen is nondistended, soft and nontender. No organomegaly or masses felt. Normal bowel sounds heard. Central nervous system: Alert and oriented. No focal neurological deficits. Extremities: Symmetric 5 x 5 power. Skin: No rashes, lesions or ulcers Psychiatry: Judgement and insight appear normal. Mood & affect appropriate.    Data Reviewed:  Lab results reviewed.  Family Communication: None  Disposition: Status is: Inpatient Remains inpatient appropriate because: Severity of disease, inpatient procedures.      Time spent: 35 minutes  Author: Marrion Coy, MD 02/27/2024 11:00 AM  For on call review [email or web address].

## 2024-02-27 NOTE — Op Note (Signed)
    OPERATIVE NOTE  PROCEDURE: 1. right femoral vein tunneled dialysis catheter placement 2. bilateral femoral vein cannulation under ultrasound guidance  PRE-OPERATIVE DIAGNOSIS: hemodialysis dependence, LEFT arteriovenous fistula thrombosis  POST-OPERATIVE DIAGNOSIS: same as above  SURGEON: Leonides Sake, MD  ANESTHESIA: local and MAC  ESTIMATED BLOOD LOSS: 30 cc  FINDINGS: 1.  Resistance to wire upon advancing into LEFT iliac venous system 2.  No resistance to wire upon advancing into LEFT iliac venous system 3.  Free flush and aspiration of all lumens  SPECIMEN(S):  none  INDICATIONS:   Lawrence [name] is a 68 y.o. male who  presents with hemodialysis dependence with LEFT arteriovenous fistula thrombosis.  The patient presents for femoral temporary dialysis catheter placement.  The patient is aware the risks of tunneled dialysis catheter placement include but are not limited to: bleeding, infection, central venous injury, possible venous stenosis, possible malpositioning in the venous system, and possible infections related to long-term catheter presence.  The patient was aware of these risks and agreed to proceed.   DESCRIPTION: After written full informed consent was obtained from the patient, the patient was taken back to the operating room.  Prior to induction, the patient was given IV antibiotics.  After obtaining adequate sedation, the patient was prepped and draped in the standard fashion for a femoral vein tunneled dialysis catheter placement.    Under ultrasound guidance, the left femoral vein was cannulated with a 18 gauge needle.  A J-wire was then placed into the LEFT iliac venous system.  The wire was then secured in place with a clamp to the drapes.    I then made a stab incisions at the cannulation site.  The wire was then unclamped and I removed the needle.  The skin tract and venotomy was dilated serially with graduated dilators.  I advanced the Trialysis  temporary dialysis catheter into the LEFT femoral vein.  The catheter encountered resistance at the final 10 cm of the catheter.  I pulled the catheter back and tried to aspirate each port.  One port freely aspirated but the other port would not aspirate.  I pulled the catheter back and tried to readvance the J-wire.  It appeared to encounter dense resistance.  Despite multiple manuevers, I could NOT get the wire to pass proximally.  I pulled the catheter and held pressure to the LEFT groin for 5 minutes.  I then repeated this same process on the RIGHT common femoral vein.  I cannulated the vein under Sonosite guidance.   I was able to advance this wire without resistance.  I made a stab incision at the cannulation site.  I dilated the tract with serial dilators.  The Trialysis catheter advanced over this wire without any resistance.    Each port was tested by aspirating and flushing.  No resistance was noted.  Each port was then thoroughly flushed with heparinized saline.  The catheter was secured in placed with two interrupted stitches of 3-0 Nylon tied to the catheter.    The cannulation site was cleaned and sterile bandages applied.  Each port was then loaded with concentrated heparin (1000 Units/mL) at the manufacturer recommended volumes to each port.  Sterile caps were applied to each port.     COMPLICATIONS: non  CONDITION: stable    Leonides Sake, MD, [facility] [place] Office: [phone] Pager: [phone]  02/27/2024, 3:48 PM

## 2024-02-27 NOTE — Plan of Care (Signed)

## 2024-02-27 NOTE — Progress Notes (Signed)
  Received patient in bed to unit.   Informed consent signed and in chart.    TX duration: 2.21hrs  taken off 9 min early d/t high arterial pressures and multiple alarms     Transported back to floor  Hand-off given to patient's nurse. No c/o and no acute distress    Access used: R HD femoral catheter Access issues: high arterial pressures    Total UF removed: Medication(s) given: none Post HD VS: wnl Post HD weight: 70.4kg     Lynann Beaver  [facility]

## 2024-02-27 NOTE — Anesthesia Postprocedure Evaluation (Signed)
 Anesthesia Post Note  Patient: Lawrence [name]  Procedure(s) Performed: INSERTION OF DIALYSIS CATHETER (Right: Groin)  Patient location during evaluation: PACU Anesthesia Type: MAC Level of consciousness: awake and alert Pain management: pain level controlled Vital Signs Assessment: post-procedure vital signs reviewed and stable Respiratory status: spontaneous breathing, nonlabored ventilation and respiratory function stable Cardiovascular status: blood pressure returned to baseline and stable Postop Assessment: no apparent nausea or vomiting Anesthetic complications: no   No notable events documented.   Last Vitals:  Vitals:   02/27/24 1700 02/27/24 1730  BP: 102/84 102/77  Pulse: 82 90  Resp: 17 17  Temp:    SpO2: 100% 100%    Last Pain:  Vitals:   02/27/24 1622  TempSrc: Oral  PainSc:                  Foye Deer

## 2024-02-27 NOTE — Consult Note (Addendum)
 VASCULAR SURGERY CONSULTATION   Requested by:  Dr. Clyde Lundborg  Reason for consultation: non-functional LUA AVF    History of Present Illness   Lawrence [name] is a 68 y.o. (Aug 15, 1956) male who presents with cc: non-functioning LUA AVF.  Pt last underwent HD on Monday.  LUA AVF was found to be thrombosed on Friday.  Pt was sent to ED yesterday.  Pt denies any significant swelling or SOB.  Pt denies any steal sx.  Past Medical History:  Diagnosis Date   Anemia    Cerebral hemorrhage (HCC) 2012   Chronic constipation    Chronic kidney disease    Diabetes mellitus without complication (HCC)    Hemodialysis patient (HCC)    Hyperlipidemia    Hypertension    Schizophrenia (HCC)    Stroke [facility])     Past Surgical History:  Procedure Laterality Date   A/V FISTULAGRAM Left 02/24/2017   Procedure: A/V Fistulagram;  Surgeon: Renford Dills, MD;  Location: [facility];  Service: Cardiovascular;  Laterality: Left;   A/V FISTULAGRAM Left 09/03/2020   Procedure: A/V FISTULAGRAM;  Surgeon: Annice Needy, MD;  Location: [facility];  Service: Cardiovascular;  Laterality: Left;   A/V FISTULAGRAM Left 06/27/2021   Procedure: A/V FISTULAGRAM;  Surgeon: Annice Needy, MD;  Location: [facility];  Service: Cardiovascular;  Laterality: Left;   A/V FISTULAGRAM Left 07/24/2021   Procedure: A/V FISTULAGRAM;  Surgeon: Annice Needy, MD;  Location: [facility];  Service: Cardiovascular;  Laterality: Left;   A/V FISTULAGRAM Left 03/25/2022   Procedure: A/V Fistulagram;  Surgeon: Bertram Denver, MD;  Location: [facility];  Service: Cardiovascular;  Laterality: Left;   A/V FISTULAGRAM N/A 06/12/2022   Procedure: A/V Fistulagram;  Surgeon: Renford Dills, MD;  Location: [facility];  Service: Cardiovascular;  Laterality: N/A;   DIALYSIS/PERMA CATHETER INSERTION N/A 12/03/2021   Procedure: DIALYSIS/PERMA CATHETER INSERTION;  Surgeon: Annice Needy, MD;   Location: [facility];  Service: Cardiovascular;  Laterality: N/A;   DIALYSIS/PERMA CATHETER INSERTION N/A 03/27/2022   Procedure: DIALYSIS/PERMA CATHETER INSERTION;  Surgeon: Annice Needy, MD;  Location: [facility];  Service: Cardiovascular;  Laterality: N/A;   DIALYSIS/PERMA CATHETER REMOVAL N/A 03/13/2022   Procedure: DIALYSIS/PERMA CATHETER REMOVAL;  Surgeon: Annice Needy, MD;  Location: [facility];  Service: Cardiovascular;  Laterality: N/A;   PERIPHERAL VASCULAR CATHETERIZATION N/A 04/17/2015   Procedure: Dialysis/Perma Catheter Insertion;  Surgeon: Renford Dills, MD;  Location: [facility];  Service: Cardiovascular;  Laterality: N/A;   PERIPHERAL VASCULAR CATHETERIZATION Left 07/30/2015   Procedure: A/V Shuntogram/Fistulagram;  Surgeon: Annice Needy, MD;  Location: [facility];  Service: Cardiovascular;  Laterality: Left;   PERIPHERAL VASCULAR CATHETERIZATION Left 07/30/2015   Procedure: A/V Shunt Intervention;  Surgeon: Annice Needy, MD;  Location: [facility];  Service: Cardiovascular;  Laterality: Left;   PERIPHERAL VASCULAR CATHETERIZATION N/A 08/20/2015   Procedure: DIALYSIS/PERMA CATHETER REMOVAL;  Surgeon: Annice Needy, MD;  Location: [facility];  Service: Cardiovascular;  Laterality: N/A;   PERIPHERAL VASCULAR THROMBECTOMY Left 03/27/2022   Procedure: PERIPHERAL VASCULAR THROMBECTOMY;  Surgeon: Annice Needy, MD;  Location: [facility];  Service: Cardiovascular;  Laterality: Left;   PERIPHERAL VASCULAR THROMBECTOMY Left 08/04/2022   Procedure: PERIPHERAL VASCULAR THROMBECTOMY;  Surgeon: Annice Needy, MD;  Location: [facility] INVASIVE  CV LAB;  Service: Cardiovascular;  Laterality: Left;   PERIPHERAL VASCULAR THROMBECTOMY Left 09/01/2023   Procedure: PERIPHERAL VASCULAR THROMBECTOMY;  Surgeon: Annice Needy, MD;  Location: [facility];  Service: Cardiovascular;  Laterality: Left;   REVISON OF ARTERIOVENOUS FISTULA  Left 12/04/2021   Procedure: REVISON OF ARTERIOVENOUS FISTULA;  Surgeon: Annice Needy, MD;  Location: [facility];  Service: Vascular;  Laterality: Left;   TEMPORARY DIALYSIS CATHETER N/A 06/11/2022   Procedure: TEMPORARY DIALYSIS CATHETER;  Surgeon: Renford Dills, MD;  Location: [facility];  Service: Cardiovascular;  Laterality: N/A;   THROMBECTOMY W/ EMBOLECTOMY Left 12/04/2021   Procedure: THROMBECTOMY ARTERIOVENOUS FISTULA;  Surgeon: Annice Needy, MD;  Location: [facility];  Service: Vascular;  Laterality: Left;   VASCULAR SURGERY     Fistula placement     Social History   Socioeconomic History   Marital status: Single    Spouse name: Not on file   Number of children: Not on file   Years of education: Not on file   Highest education level: Not on file  Occupational History   Not on file  Tobacco Use   Smoking status: Never   Smokeless tobacco: Never  Substance and Sexual Activity   Alcohol use: No   Drug use: No   Sexual activity: Not Currently    Birth control/protection: None  Other Topics Concern   Not on file  Social History Narrative    Lives at group home: [facility] with 6 other people.   Social Drivers of Corporate investment banker Strain: Not on file  Food Insecurity: No Food Insecurity (02/26/2024)   Hunger Vital Sign    Worried About Running Out of Food in the Last Year: Never true    Ran Out of Food in the Last Year: Never true  Transportation Needs: No Transportation Needs (02/26/2024)   PRAPARE - Administrator, Civil Service (Medical): No    Lack of Transportation (Non-Medical): No  Physical Activity: Not on file  Stress: Not on file  Social Connections: Unknown (02/26/2024)   Social Connection and Isolation Panel [NHANES]    Frequency of Communication with Friends and Family: More than three times a week    Frequency of Social Gatherings with Friends and Family: Twice a week    Attends Religious Services: 1 to 4  times per year    Active Member of [employer] or Organizations: No    Attends Banker Meetings: Never    Marital Status: Patient declined  Catering manager Violence: Not At Risk (02/26/2024)   Humiliation, Afraid, Rape, and Kick questionnaire    Fear of Current or Ex-Partner: No    Emotionally Abused: No    Physically Abused: No    Sexually Abused: No    Family History  Problem Relation Age of Onset   Diabetes Mother    Kidney cancer Mother    Diabetes Sister    Stroke Brother    Prostate cancer Neg Hx    Bladder Cancer Neg Hx     Current Facility-Administered Medications  Medication Dose Route Frequency Provider Last Rate Last Admin   acetaminophen (TYLENOL) tablet 650 mg  650 mg Oral Q6H PRN Lorretta Harp, MD       aspirin EC tablet 81 mg  81 mg Oral Daily Lorretta Harp, MD       benztropine (COGENTIN) tablet 0.5 mg  0.5 mg Oral BID Lorretta Harp, MD  0.5 mg at 02/27/24 0147   docusate sodium (COLACE) capsule 200 mg  200 mg Oral Daily PRN Lorretta Harp, MD       haloperidol (HALDOL) tablet 5 mg  5 mg Oral Daily Otelia Sergeant, RPH       And   haloperidol (HALDOL) tablet 10 mg  10 mg Oral QHS Otelia Sergeant, RPH   10 mg at 02/27/24 0258   heparin injection 5,000 Units  5,000 Units Subcutaneous Alean Rinne, MD   5,000 Units at 02/27/24 1610   hydrALAZINE (APRESOLINE) injection 5 mg  5 mg Intravenous Q2H PRN Lorretta Harp, MD       insulin aspart (novoLOG) injection 0-5 Units  0-5 Units Subcutaneous QHS Lorretta Harp, MD       insulin aspart (novoLOG) injection 0-9 Units  0-9 Units Subcutaneous TID WC Lorretta Harp, MD       loratadine (CLARITIN) tablet 10 mg  10 mg Oral Raeanne Gathers, MD       mirtazapine (REMERON) tablet 15 mg  15 mg Oral QHS Lorretta Harp, MD   15 mg at 02/27/24 0147   ondansetron (ZOFRAN) injection 4 mg  4 mg Intravenous Q8H PRN Lorretta Harp, MD       pravastatin (PRAVACHOL) tablet 40 mg  40 mg Oral QHS Lorretta Harp, MD       sevelamer carbonate (RENVELA) tablet 1,600  mg  1,600 mg Oral TID WC Lorretta Harp, MD        Allergies  Allergen Reactions   Nsaids Other (See Comments)    Cannot take due to renal disease    REVIEW OF SYSTEMS (negative unless checked):   Cardiac:  []  Chest pain or chest pressure? []  Shortness of breath upon activity? []  Shortness of breath when lying flat? []  Irregular heart rhythm?  Vascular:  []  Pain in calf, thigh, or hip brought on by walking? []  Pain in feet at night that wakes you up from your sleep? []  Blood clot in your veins? []  Leg swelling?  Pulmonary:  []  Oxygen at home? []  Productive cough? []  Wheezing?  Neurologic:  []  Sudden weakness in arms or legs? []  Sudden numbness in arms or legs? []  Sudden onset of difficult speaking or slurred speech? []  Temporary loss of vision in one eye? []  Problems with dizziness?  Gastrointestinal:  []  Blood in stool? []  Vomited blood?  Genitourinary:  []  Burning when urinating? []  Blood in urine?  Psychiatric:  []  Major depression  Hematologic:  []  Bleeding problems? []  Problems with blood clotting?  Dermatologic:  []  Rashes or ulcers?  Constitutional:  []  Fever or chills?  Ear/Nose/Throat:  []  Change in hearing? []  Nose bleeds? []  Sore throat?  Musculoskeletal:  []  Back pain? []  Joint pain? []  Muscle pain?   Physical Examination     Vitals:   02/26/24 2000 02/26/24 2031 02/27/24 0331 02/27/24 0500  BP: 117/76 112/75 106/70   Pulse: 88 90 93   Resp: 17  18   Temp: 97.7 F (36.5 C) (!) 97.4 F (36.3 C) 98.4 F (36.9 C)   TempSrc: Oral Oral Oral   SpO2: 100% 100% 100%   Weight:    71.2 kg  Height:       Body mass index is 24.58 kg/m.  General Alert, O x 3, WD, NAD  Head Reyno/AT,    Neck Supple, mid-line trachea,  no obvious JVD  Pulmonary Sym exp, good B air movt, CTA B  Cardiac RRR, Nl S1,  S2, no Murmurs, No rubs, No S3,S4  Vascular LUA AVF: aneurysmal distal AVF, pulsatile AVF without any thrill or bruit    Laboratory    CBC    Latest Ref Rng & Units 02/27/2024    5:16 AM 02/26/2024    3:23 PM 06/13/2022    9:30 AM  CBC  WBC 4.0 - 10.5 K/uL 5.3  6.1  4.0   Hemoglobin 13.0 - 17.0 g/dL 16.1  09.6  04.5   Hematocrit 39.0 - 52.0 % 32.8  37.6  33.4   Platelets 150 - 400 K/uL 96  110  64     BMP    Latest Ref Rng & Units 02/27/2024    5:16 AM 02/26/2024    3:23 PM 08/01/2022    9:29 PM  BMP  Glucose 70 - 99 mg/dL 71  409  89   BUN 8 - 23 mg/dL 78  69    Creatinine 8.11 - 1.24 mg/dL 91.47  82.95  62.13   Sodium 135 - 145 mmol/L 133  138  133   Potassium 3.5 - 5.1 mmol/L 4.3  4.1  4.9   Chloride 98 - 111 mmol/L 94  95  94   CO2 22 - 32 mmol/L 23  26  23    Calcium 8.9 - 10.3 mg/dL 9.0  9.3  8.8     Coagulation Lab Results  Component Value Date   INR 1.1 02/26/2024   INR 1.1 06/10/2022   INR 1.1 12/13/2014   Radiology     No results found.   Medical Decision Making   KOBE OFALLON is a 68 y.o. male who presents with: thrombosed LUA AVF  Renal consult requested by Hospitalist Suspect pt might need temp dialysis catheter placed.   Call me if needed Except in rare cases, I place these in the OR which will delay placement if pt has eaten Dr. Wyn Quaker aware of this patient and had left instructions to add him on for Monday if needed  Leonides Sake, MD, FACS, FSVS Covering for [facility]: [phone]  02/27/2024, 7:04 AM   Addendum Renal requesting dialysis catheter placement.  Will add on to OR schedule.  Leonides Sake, MD, FACS, FSVS Covering for [facility]: [phone]  02/27/2024, 9:03 AM  Addendum Case moved up.  Anticipate temporary need for [facility] so will place femoral temp dialysis catheter.  The patient is aware the risks of tunneled dialysis catheter placement include but are not limited to: bleeding, infection, central venous injury, possible venous stenosis, possible malpositioning in the venous system, and possible infections  related to long-term catheter presence.  The patient was aware of these risks and agreed to proceed.

## 2024-02-27 NOTE — Hospital Course (Signed)
 Lawrence [name] is a 68 y.o. male with medical history significant of ESRD-HD (MWF), FSGS, HTN, HLD, DM, stroke, ICH, thrombocytopenia, who presents with AV fistula occlusion in left arm.  Patient is seen by nephrology and vascular surgery, a temporary dialysis catheter be placed today and start dialysis. Patient went to the OR on 3/24, had a mechanical thrombectomy to the AV fistula.  Patient will have dialysis today and discharge after.

## 2024-02-27 NOTE — Anesthesia Preprocedure Evaluation (Addendum)
 Anesthesia Evaluation  Patient identified by MRN, date of birth, ID band Patient awake    Reviewed: Allergy & Precautions, H&P , NPO status , Patient's Chart, lab work & pertinent test results, reviewed documented beta blocker date and time   Airway Mallampati: II  TM Distance: >3 FB Neck ROM: full    Dental  (+) Edentulous Lower, Edentulous Upper   Pulmonary neg pulmonary ROS   Pulmonary exam normal        Cardiovascular hypertension, Pt. on home beta blockers and Pt. on medications Normal cardiovascular exam     Neuro/Psych  PSYCHIATRIC DISORDERS (Chronic schizoaffective disorder)      CVA (2012)    GI/Hepatic negative GI ROS, Neg liver ROS,,,  Endo/Other  diabetes    Renal/GU ESRF and DialysisRenal disease  negative genitourinary   Musculoskeletal   Abdominal Normal abdominal exam  (+)   Peds  Hematology negative hematology ROS (+)   Anesthesia Other Findings AV fistula occlusion last HD monday  Past Medical History: No date: Anemia 2012: Cerebral hemorrhage (HCC) No date: Chronic constipation No date: Chronic kidney disease No date: Diabetes mellitus without complication (HCC) No date: Hemodialysis patient (HCC) No date: Hyperlipidemia No date: Hypertension No date: Schizophrenia (HCC) No date: Stroke [facility])  Past Surgical History: 02/24/2017: A/V FISTULAGRAM; Left     Comment:  Procedure: A/V Fistulagram;  Surgeon: Renford Dills,              MD;  Location: [facility];  Service:               Cardiovascular;  Laterality: Left; 09/03/2020: A/V FISTULAGRAM; Left     Comment:  Procedure: A/V FISTULAGRAM;  Surgeon: Annice Needy, MD;               Location: [facility];  Service: Cardiovascular;              Laterality: Left; 06/27/2021: A/V FISTULAGRAM; Left     Comment:  Procedure: A/V FISTULAGRAM;  Surgeon: Annice Needy, MD;               Location: [facility];  Service:  Cardiovascular;              Laterality: Left; 07/24/2021: A/V FISTULAGRAM; Left     Comment:  Procedure: A/V FISTULAGRAM;  Surgeon: Annice Needy, MD;               Location: [facility];  Service: Cardiovascular;              Laterality: Left; 03/25/2022: A/V FISTULAGRAM; Left     Comment:  Procedure: A/V Fistulagram;  Surgeon: Bertram Denver,               MD;  Location: [facility];  Service:               Cardiovascular;  Laterality: Left; 06/12/2022: A/V FISTULAGRAM; N/A     Comment:  Procedure: A/V Fistulagram;  Surgeon: Renford Dills, MD;  Location: [facility];  Service:               Cardiovascular;  Laterality: N/A; 12/03/2021: DIALYSIS/PERMA CATHETER INSERTION; N/A     Comment:  Procedure: DIALYSIS/PERMA CATHETER INSERTION;  Surgeon:               Annice Needy, MD;  Location: [facility];                Service: Cardiovascular;  Laterality: N/A; 03/27/2022: DIALYSIS/PERMA CATHETER INSERTION; N/A     Comment:  Procedure: DIALYSIS/PERMA CATHETER INSERTION;  Surgeon:               Annice Needy, MD;  Location: [facility];                Service: Cardiovascular;  Laterality: N/A; 03/13/2022: DIALYSIS/PERMA CATHETER REMOVAL; N/A     Comment:  Procedure: DIALYSIS/PERMA CATHETER REMOVAL;  Surgeon:               Annice Needy, MD;  Location: [facility];                Service: Cardiovascular;  Laterality: N/A; 04/17/2015: PERIPHERAL VASCULAR CATHETERIZATION; N/A     Comment:  Procedure: Dialysis/Perma Catheter Insertion;  Surgeon:               Renford Dills, MD;  Location: [facility];                Service: Cardiovascular;  Laterality: N/A; 07/30/2015: PERIPHERAL VASCULAR CATHETERIZATION; Left     Comment:  Procedure: A/V Shuntogram/Fistulagram;  Surgeon: Annice Needy, MD;  Location: [facility];  Service:               Cardiovascular;  Laterality: Left; 07/30/2015: PERIPHERAL  VASCULAR CATHETERIZATION; Left     Comment:  Procedure: A/V Shunt Intervention;  Surgeon: Annice Needy, MD;  Location: [facility];  Service:               Cardiovascular;  Laterality: Left; 08/20/2015: PERIPHERAL VASCULAR CATHETERIZATION; N/A     Comment:  Procedure: DIALYSIS/PERMA CATHETER REMOVAL;  Surgeon:               Annice Needy, MD;  Location: [facility];                Service: Cardiovascular;  Laterality: N/A; 03/27/2022: PERIPHERAL VASCULAR THROMBECTOMY; Left     Comment:  Procedure: PERIPHERAL VASCULAR THROMBECTOMY;  Surgeon:               Annice Needy, MD;  Location: [facility];                Service: Cardiovascular;  Laterality: Left; 08/04/2022: PERIPHERAL VASCULAR THROMBECTOMY; Left     Comment:  Procedure: PERIPHERAL VASCULAR THROMBECTOMY;  Surgeon:               Annice Needy, MD;  Location: [facility];                Service: Cardiovascular;  Laterality: Left; 09/01/2023: PERIPHERAL VASCULAR THROMBECTOMY; Left     Comment:  Procedure: PERIPHERAL VASCULAR THROMBECTOMY;  Surgeon:               Annice Needy, MD;  Location: [facility];                Service: Cardiovascular;  Laterality: Left; 12/04/2021: REVISON OF ARTERIOVENOUS FISTULA; Left     Comment:  Procedure: REVISON OF ARTERIOVENOUS FISTULA;  Surgeon:  Annice Needy, MD;  Location: [facility];  Service:               Vascular;  Laterality: Left; 06/11/2022: TEMPORARY DIALYSIS CATHETER; N/A     Comment:  Procedure: TEMPORARY DIALYSIS CATHETER;  Surgeon:               Renford Dills, MD;  Location: [facility];               Service: Cardiovascular;  Laterality: N/A; 12/04/2021: THROMBECTOMY W/ EMBOLECTOMY; Left     Comment:  Procedure: THROMBECTOMY ARTERIOVENOUS FISTULA;  Surgeon:              Annice Needy, MD;  Location: [facility];  Service:               Vascular;  Laterality: Left; No date: VASCULAR SURGERY     Comment:  Fistula  placement  BMI    Body Mass Index: 24.58 kg/m      Reproductive/Obstetrics negative OB ROS                             Anesthesia Physical Anesthesia Plan  ASA: 3  Anesthesia Plan: MAC   Post-op Pain Management:    Induction: Intravenous  PONV Risk Score and Plan:   Airway Management Planned: Natural Airway  Additional Equipment:   Intra-op Plan:   Post-operative Plan:   Informed Consent: I have reviewed the patients History and Physical, chart, labs and discussed the procedure including the risks, benefits and alternatives for the proposed anesthesia with the patient or authorized representative who has indicated his/her understanding and acceptance.     Dental Advisory Given  Plan Discussed with: CRNA and Surgeon  Anesthesia Plan Comments:         Anesthesia Quick Evaluation

## 2024-02-27 NOTE — Progress Notes (Signed)
 Referring Provider: No ref. provider found Primary Care Physician:  [name], MD Primary Nephrologist:  Dr.   Jaquita Rector for Consultation: ESRD.  HPI:  68 y.o. male with medical history significant of ESRD-HD (MWF), FSGS, HTN, HLD, DM, stroke, ICH, thrombocytopenia, who presents with AV fistula occlusion in left arm.  Patient has been on a Monday Wednesday Friday schedule for dialysis.  His last dialysis was last Monday.  He denies any chest pain, shortness of breath or orthopnea.  He has been n.p.o. for a catheter placement today.  Vascular surgery is aware.  Past Medical History:  Diagnosis Date   Anemia    Cerebral hemorrhage (HCC) 2012   Chronic constipation    Chronic kidney disease    Diabetes mellitus without complication (HCC)    Hemodialysis patient (HCC)    Hyperlipidemia    Hypertension    Schizophrenia (HCC)    Stroke [facility])     Past Surgical History:  Procedure Laterality Date   A/V FISTULAGRAM Left 02/24/2017   Procedure: A/V Fistulagram;  Surgeon: Renford Dills, MD;  Location: [facility];  Service: Cardiovascular;  Laterality: Left;   A/V FISTULAGRAM Left 09/03/2020   Procedure: A/V FISTULAGRAM;  Surgeon: Annice Needy, MD;  Location: [facility];  Service: Cardiovascular;  Laterality: Left;   A/V FISTULAGRAM Left 06/27/2021   Procedure: A/V FISTULAGRAM;  Surgeon: Annice Needy, MD;  Location: [facility];  Service: Cardiovascular;  Laterality: Left;   A/V FISTULAGRAM Left 07/24/2021   Procedure: A/V FISTULAGRAM;  Surgeon: Annice Needy, MD;  Location: [facility];  Service: Cardiovascular;  Laterality: Left;   A/V FISTULAGRAM Left 03/25/2022   Procedure: A/V Fistulagram;  Surgeon: Bertram Denver, MD;  Location: [facility];  Service: Cardiovascular;  Laterality: Left;   A/V FISTULAGRAM N/A 06/12/2022   Procedure: A/V Fistulagram;  Surgeon: Renford Dills, MD;  Location: [facility];  Service: Cardiovascular;   Laterality: N/A;   DIALYSIS/PERMA CATHETER INSERTION N/A 12/03/2021   Procedure: DIALYSIS/PERMA CATHETER INSERTION;  Surgeon: Annice Needy, MD;  Location: [facility];  Service: Cardiovascular;  Laterality: N/A;   DIALYSIS/PERMA CATHETER INSERTION N/A 03/27/2022   Procedure: DIALYSIS/PERMA CATHETER INSERTION;  Surgeon: Annice Needy, MD;  Location: [facility];  Service: Cardiovascular;  Laterality: N/A;   DIALYSIS/PERMA CATHETER REMOVAL N/A 03/13/2022   Procedure: DIALYSIS/PERMA CATHETER REMOVAL;  Surgeon: Annice Needy, MD;  Location: [facility];  Service: Cardiovascular;  Laterality: N/A;   PERIPHERAL VASCULAR CATHETERIZATION N/A 04/17/2015   Procedure: Dialysis/Perma Catheter Insertion;  Surgeon: Renford Dills, MD;  Location: [facility];  Service: Cardiovascular;  Laterality: N/A;   PERIPHERAL VASCULAR CATHETERIZATION Left 07/30/2015   Procedure: A/V Shuntogram/Fistulagram;  Surgeon: Annice Needy, MD;  Location: [facility];  Service: Cardiovascular;  Laterality: Left;   PERIPHERAL VASCULAR CATHETERIZATION Left 07/30/2015   Procedure: A/V Shunt Intervention;  Surgeon: Annice Needy, MD;  Location: [facility];  Service: Cardiovascular;  Laterality: Left;   PERIPHERAL VASCULAR CATHETERIZATION N/A 08/20/2015   Procedure: DIALYSIS/PERMA CATHETER REMOVAL;  Surgeon: Annice Needy, MD;  Location: [facility];  Service: Cardiovascular;  Laterality: N/A;   PERIPHERAL VASCULAR THROMBECTOMY Left 03/27/2022   Procedure: PERIPHERAL VASCULAR THROMBECTOMY;  Surgeon: Annice Needy, MD;  Location: [facility];  Service: Cardiovascular;  Laterality: Left;   PERIPHERAL VASCULAR THROMBECTOMY Left 08/04/2022   Procedure:  PERIPHERAL VASCULAR THROMBECTOMY;  Surgeon: Annice Needy, MD;  Location: [facility];  Service: Cardiovascular;  Laterality: Left;   PERIPHERAL VASCULAR THROMBECTOMY Left 09/01/2023   Procedure: PERIPHERAL VASCULAR  THROMBECTOMY;  Surgeon: Annice Needy, MD;  Location: [facility];  Service: Cardiovascular;  Laterality: Left;   REVISON OF ARTERIOVENOUS FISTULA Left 12/04/2021   Procedure: REVISON OF ARTERIOVENOUS FISTULA;  Surgeon: Annice Needy, MD;  Location: [facility];  Service: Vascular;  Laterality: Left;   TEMPORARY DIALYSIS CATHETER N/A 06/11/2022   Procedure: TEMPORARY DIALYSIS CATHETER;  Surgeon: Renford Dills, MD;  Location: [facility];  Service: Cardiovascular;  Laterality: N/A;   THROMBECTOMY W/ EMBOLECTOMY Left 12/04/2021   Procedure: THROMBECTOMY ARTERIOVENOUS FISTULA;  Surgeon: Annice Needy, MD;  Location: [facility];  Service: Vascular;  Laterality: Left;   VASCULAR SURGERY     Fistula placement    Prior to Admission medications   Medication Sig Start Date End Date Taking? Authorizing Provider  amLODipine (NORVASC) 10 MG tablet Take 10 mg by mouth daily. 02/17/15   [provider]  aspirin EC 81 MG tablet Take 81 mg by mouth daily.     [provider]  benztropine (COGENTIN) 0.5 MG tablet Take 0.5 mg by mouth 2 (two) times daily.    [provider]  carvedilol (COREG) 25 MG tablet Take 25 mg by mouth 2 (two) times daily with a meal. 04/04/22   [provider]  cetirizine (ZYRTEC) 10 MG tablet Take 10 mg by mouth every other day.    [provider]  Cholecalciferol (VITAMIN D3) 125 MCG (5000 UT) CAPS Take 5,000 Units by mouth once a week.    [provider]  Coenzyme Q10 100 MG capsule Take 100 mg by mouth at bedtime.    [provider]  diphenhydrAMINE (BENADRYL) 25 mg capsule Take 25 mg by mouth See admin instructions. Take 1 capsule (25mg ) by mouth nightly as needed for sleep - may take 1 capsule (25mg ) by mouth during the daytime as needed for itching    [provider]  DIPHENHYDRAMINE-ZINC OXIDE EX Apply 1 application. topically See admin instructions. Apply small amount of affected area three times  daily as needed    [provider]  docusate sodium (COLACE) 100 MG capsule Take 100 mg by mouth 2 (two) times daily.    [provider]  docusate sodium (COLACE) 100 MG capsule Take 200 mg by mouth daily as needed for mild constipation or moderate constipation.    [provider]  [facility] TEST test strip EasyMax 06/10/22   [provider]  feeding supplement (BOOST HIGH PROTEIN) LIQD Take 1 Container by mouth See admin instructions. Take 1 container by mouth three times a week or as needed of Nutritional Supplement    [provider]  fluticasone (FLONASE) 50 MCG/ACT nasal spray Place 2 sprays into both nostrils daily.    [provider]  glipiZIDE (GLUCOTROL XL) 2.5 MG 24 hr tablet Take 2.5 mg by mouth daily with breakfast.     [provider]  haloperidol (HALDOL) 10 MG tablet Take 5-10 mg by mouth See admin instructions. Take 5 mg in the morning and 10 mg at bedtime    [provider]  lidocaine-prilocaine (EMLA) cream Apply 1 application topically 3 (three) times a week. Apply over dialysis shunt 30 minutes prior to dialysis.    [provider]  midodrine (PROAMATINE) 5 MG tablet Take 5 mg  by mouth 3 (three) times a week. (Take 1 hour prior to dialysis)    [provider]  mirtazapine (REMERON) 15 MG tablet Take 15 mg by mouth at bedtime.    [provider]  niacin (NIASPAN) 1000 MG CR tablet Take 1,000 mg by mouth at bedtime.     [provider]  polyethylene glycol (MIRALAX / GLYCOLAX) 17 g packet Take 17 g by mouth daily as needed (Constipation). Mix 17 g (1 capful) in to 8 ounces of Juice/Water    [provider]  pravastatin (PRAVACHOL) 40 MG tablet Take 40 mg by mouth at bedtime.     [provider]  sevelamer carbonate (RENVELA) 800 MG tablet Take 2 tablets (1,600 mg total) by mouth 3 (three) times daily with meals. 12/05/21   Azucena Fallen, MD    Current  Facility-Administered Medications  Medication Dose Route Frequency Provider Last Rate Last Admin   acetaminophen (TYLENOL) tablet 650 mg  650 mg Oral Q6H PRN Lorretta Harp, MD       aspirin EC tablet 81 mg  81 mg Oral Daily Lorretta Harp, MD   81 mg at 02/27/24 1610   benztropine (COGENTIN) tablet 0.5 mg  0.5 mg Oral BID Lorretta Harp, MD   0.5 mg at 02/27/24 0943   [START ON 02/28/2024] Chlorhexidine Gluconate Cloth 2 % PADS 6 each  6 each Topical Q0600 Lorain Childes, MD       docusate sodium (COLACE) capsule 200 mg  200 mg Oral Daily PRN Lorretta Harp, MD       haloperidol (HALDOL) tablet 5 mg  5 mg Oral Daily Otelia Sergeant, RPH   5 mg at 02/27/24 1336   And   haloperidol (HALDOL) tablet 10 mg  10 mg Oral QHS Otelia Sergeant, RPH   10 mg at 02/27/24 0258   heparin injection 5,000 Units  5,000 Units Subcutaneous Alean Rinne, MD   5,000 Units at 02/27/24 1336   hydrALAZINE (APRESOLINE) injection 5 mg  5 mg Intravenous Q2H PRN Lorretta Harp, MD       insulin aspart (novoLOG) injection 0-5 Units  0-5 Units Subcutaneous QHS Lorretta Harp, MD       insulin aspart (novoLOG) injection 0-9 Units  0-9 Units Subcutaneous TID WC Lorretta Harp, MD       loratadine (CLARITIN) tablet 10 mg  10 mg Oral Raeanne Gathers, MD   10 mg at 02/27/24 9604   mirtazapine (REMERON) tablet 15 mg  15 mg Oral QHS Lorretta Harp, MD   15 mg at 02/27/24 0147   ondansetron (ZOFRAN) injection 4 mg  4 mg Intravenous Q8H PRN Lorretta Harp, MD       pravastatin (PRAVACHOL) tablet 40 mg  40 mg Oral QHS Lorretta Harp, MD       sevelamer carbonate (RENVELA) tablet 1,600 mg  1,600 mg Oral TID WC Lorretta Harp, MD        Allergies as of 02/26/2024 - Review Complete 02/26/2024  Allergen Reaction Noted   Nsaids Other (See Comments) 04/09/2016    Family History  Problem Relation Age of Onset   Diabetes Mother    Kidney cancer Mother    Diabetes Sister    Stroke Brother    Prostate cancer Neg Hx    Bladder Cancer Neg Hx     Social History    Socioeconomic History   Marital status: Single    Spouse name: Not on file   Number of children:  Not on file   Years of education: Not on file   Highest education level: Not on file  Occupational History   Not on file  Tobacco Use   Smoking status: Never   Smokeless tobacco: Never  Substance and Sexual Activity   Alcohol use: No   Drug use: No   Sexual activity: Not Currently    Birth control/protection: None  Other Topics Concern   Not on file  Social History Narrative    Lives at group home: [facility] with 6 other people.   Social Drivers of Corporate investment banker Strain: Not on file  Food Insecurity: No Food Insecurity (02/26/2024)   Hunger Vital Sign    Worried About Running Out of Food in the Last Year: Never true    Ran Out of Food in the Last Year: Never true  Transportation Needs: No Transportation Needs (02/26/2024)   PRAPARE - Administrator, Civil Service (Medical): No    Lack of Transportation (Non-Medical): No  Physical Activity: Not on file  Stress: Not on file  Social Connections: Unknown (02/26/2024)   Social Connection and Isolation Panel [NHANES]    Frequency of Communication with Friends and Family: More than three times a week    Frequency of Social Gatherings with Friends and Family: Twice a week    Attends Religious Services: 1 to 4 times per year    Active Member of [employer] or Organizations: No    Attends Banker Meetings: Never    Marital Status: Patient declined  Catering manager Violence: Not At Risk (02/26/2024)   Humiliation, Afraid, Rape, and Kick questionnaire    Fear of Current or Ex-Partner: No    Emotionally Abused: No    Physically Abused: No    Sexually Abused: No    Physical Exam: Vital signs in last 24 hours: Temp:  [97.4 F (36.3 C)-98.4 F (36.9 C)] 98.2 F (36.8 C) (03/22 0759) Pulse Rate:  [86-102] 102 (03/22 0759) Resp:  [11-18] 18 (03/22 0759) BP: (93-117)/(70-79) 102/70  (03/22 0759) SpO2:  [98 %-100 %] 98 % (03/22 0759) Weight:  [70 kg-71.2 kg] 71.2 kg (03/22 0500) Last BM Date : 02/26/24 General:   Alert,  Well-developed, well-nourished, pleasant and cooperative in NAD Head:  Normocephalic and atraumatic. Eyes:  Sclera clear, no icterus.   Conjunctiva pink. Ears:  Normal auditory acuity. Nose:  No deformity, discharge,  or lesions. Lungs:  Clear throughout to auscultation.   No wheezes, crackles, or rhonchi. No acute distress. Heart:  Regular rate and rhythm; no murmurs, clicks, rubs,  or gallops. Abdomen:  Soft, nontender and nondistended. No masses, hepatosplenomegaly or hernias noted. Normal bowel sounds, without guarding, and without rebound.   Extremities:  Without clubbing or edema.  Intake/Output from previous day: 03/21 0701 - 03/22 0700 In: 740 [P.O.:740] Out: -  Intake/Output this shift: No intake/output data recorded.  Lab Results: Recent Labs    02/26/24 1523 02/27/24 0516  WBC 6.1 5.3  HGB 11.9* 10.8*  HCT 37.6* 32.8*  PLT 110* 96*   BMET Recent Labs    02/26/24 1523 02/27/24 0516  NA 138 133*  K 4.1 4.3  CL 95* 94*  CO2 26 23  GLUCOSE 115* 71  BUN 69* 78*  CREATININE 10.93* 11.52*  CALCIUM 9.3 9.0   LFT No results for input(s): "PROT", "ALBUMIN", "AST", "ALT", "ALKPHOS", "BILITOT", "BILIDIR", "IBILI" in the last 72 hours. PT/INR Recent Labs    02/26/24 1853  LABPROT 14.9  INR 1.1   Hepatitis Panel No results for input(s): "HEPBSAG", "HCVAB", "HEPAIGM", "HEPBIGM" in the last 72 hours.  Studies/Results: No results found.  Assessment/Plan:   68 y.o. male with medical history significant of ESRD-HD (MWF), FSGS, HTN, HLD, DM, stroke, ICH, thrombocytopenia, who presents with AV fistula occlusion in left arm.  Patient has been on a Monday Wednesday Friday schedule for dialysis.  His last dialysis was last Monday.  He denies any chest pain, shortness of breath or orthopnea.  He has been n.p.o. for a catheter  placement today.  Vascular surgery is aware.  ESRD: Will do dialysis today after the catheter placement by vascular.  Dialysis unit is aware of it.  ANEMIA: Will continue to monitor closely.  MBD: Will follow-up with the PTH, calcium and phosphorus levels.  HTN/VOL: Blood pressure is well-controlled.  Labs and medications reviewed. Will continue to monitor closely.    LOS: 0 Lorain Childes, MD [facility] @TODAY @1 :50 PM

## 2024-02-27 NOTE — Transfer of Care (Signed)
 Immediate Anesthesia Transfer of Care Note  Patient: Lawrence [name]  Procedure(s) Performed: INSERTION OF DIALYSIS CATHETER (Right: Groin)  Patient Location: PACU  Anesthesia Type:MAC  Level of Consciousness: awake  Airway & Oxygen Therapy: Patient Spontanous Breathing  Post-op Assessment: Report given to RN, Post -op Vital signs reviewed and stable, and Patient moving all extremities X 4  Post vital signs: Reviewed and stable  Last Vitals:  Vitals Value Taken Time  BP    Temp    Pulse    Resp    SpO2      Last Pain:  Vitals:   02/27/24 0759  TempSrc: Oral  PainSc: 0-No pain         Complications: No notable events documented.

## 2024-02-28 DIAGNOSIS — T82898A Other specified complication of vascular prosthetic devices, implants and grafts, initial encounter: Secondary | ICD-10-CM | POA: Diagnosis not present

## 2024-02-28 DIAGNOSIS — Z992 Dependence on renal dialysis: Secondary | ICD-10-CM | POA: Diagnosis not present

## 2024-02-28 DIAGNOSIS — N186 End stage renal disease: Secondary | ICD-10-CM | POA: Diagnosis not present

## 2024-02-28 LAB — GLUCOSE, CAPILLARY
Glucose-Capillary: 84 mg/dL (ref 70–99)
Glucose-Capillary: 140 mg/dL — ABNORMAL HIGH (ref 70–99)
Glucose-Capillary: 115 mg/dL — ABNORMAL HIGH (ref 70–99)
Glucose-Capillary: 114 mg/dL — ABNORMAL HIGH (ref 70–99)

## 2024-02-28 NOTE — Progress Notes (Signed)
      Daily Progress Note   Assessment/Planning:   POD #1 s/p R fem temp dialysis catheter placement  Pt mostly completed HD yesterday Added pt on angio schedule for tomorrow for LUA AVF declot Preop orders entered Consent ordered All questions answered   Subjective  - 1 Day Post-Op   No complaints, last night's HD run reviewed   Objective   Vitals:   02/27/24 1915 02/27/24 1949 02/28/24 0357 02/28/24 0845  BP:  105/69 104/76 102/67  Pulse:  91 90 91  Resp:  18 16 17   Temp:  98.5 F (36.9 C) 98.7 F (37.1 C) 98.3 F (36.8 C)  TempSrc:  Oral  Oral  SpO2:  100% 99% 96%  Weight: 70.4 kg     Height:         Intake/Output Summary (Last 24 hours) at 02/28/2024 0924 Last data filed at 02/27/2024 1909 Gross per 24 hour  Intake 100 ml  Output 902 ml  Net -802 ml    VASC R groin: bandaged, no bleeding, L groin: no bleeding or hematoma    Laboratory   CBC    Latest Ref Rng & Units 02/27/2024    4:35 PM 02/27/2024    5:16 AM 02/26/2024    3:23 PM  CBC  WBC 4.0 - 10.5 K/uL 4.6  5.3  6.1   Hemoglobin 13.0 - 17.0 g/dL 29.5  62.1  30.8   Hematocrit 39.0 - 52.0 % 33.5  32.8  37.6   Platelets 150 - 400 K/uL 94  96  110     BMET    Component Value Date/Time   NA 130 (L) 02/27/2024 1635   NA 140 03/21/2015 1512   K 4.6 02/27/2024 1635   K 4.0 03/21/2015 1512   CL 97 (L) 02/27/2024 1635   CL 95 (L) 03/21/2015 1512   CO2 25 02/27/2024 1635   CO2 36 (H) 03/21/2015 1512   GLUCOSE 100 (H) 02/27/2024 1635   GLUCOSE 117 (H) 03/21/2015 1512   BUN 81 (H) 02/27/2024 1635   BUN 64 (H) 03/21/2015 1512   CREATININE 12.48 (H) 02/27/2024 1635   CREATININE 7.71 (H) 03/21/2015 1512   CALCIUM 8.8 (L) 02/27/2024 1635   CALCIUM 8.5 (L) 03/21/2015 1512   GFRNONAA 4 (L) 02/27/2024 1635   GFRNONAA 7 (L) 03/21/2015 1512   GFRAA 21 (L) 03/08/2020 1756   GFRAA 8 (L) 03/21/2015 1512     Leonides Sake, MD, FACS, FSVS Covering for [facility]: [phone]  02/28/2024, 9:24 AM

## 2024-02-28 NOTE — Plan of Care (Signed)
  Problem: Pain Managment: Goal: General experience of comfort will improve and/or be controlled Outcome: Progressing   Problem: Safety: Goal: Ability to remain free from injury will improve Outcome: Progressing   Problem: Skin Integrity: Goal: Risk for impaired skin integrity will decrease Outcome: Progressing

## 2024-02-28 NOTE — Progress Notes (Signed)
  Progress Note   Patient: Lawrence [name] ZOX:[id number] DOB: [birth year] DOA: 02/26/2024     1 DOS: the patient was seen and examined on 02/28/2024   Brief hospital course: KENDALE REMBOLD is a 68 y.o. male with medical history significant of ESRD-HD (MWF), FSGS, HTN, HLD, DM, stroke, ICH, thrombocytopenia, who presents with AV fistula occlusion in left arm.  Patient is seen by nephrology and vascular surgery, a temporary dialysis catheter be placed today and start dialysis.   Principal Problem:   AV fistula occlusion, initial encounter (HCC) Active Problems:   ESRD on dialysis [facility])   Essential hypertension   Type II diabetes mellitus with renal manifestations (HCC)   Cerebrovascular accident (CVA) (HCC)   Dyslipidemia   Thrombocytopenia, unspecified (HCC)   Chronic schizoaffective disorder (HCC)   End stage renal disease (HCC)   Assessment and Plan: AV fistula occlusion, initial encounter [facility]):  ESRD on dialysis (MWF):  Patient had temporal femoral dialysis catheter performed on 3/22, started on dialysis. Vascular procedure scheduled for tomorrow.  Patient probably can discharged after procedure and dialysis.   Essential hypertension:  Pressure not significant elevated, medication on hold.     Type II diabetes mellitus with renal manifestations [facility]):  Well-controlled, continue sliding scale insulin.   Cerebrovascular accident (CVA) (HCC) -Aspirin, pravastatin   Dyslipidemia -Pravastatin   Thrombocytopenia, unspecified (HCC):  Chronic.   Chronic schizoaffective disorder [facility]): Patient is calm now. -Continue home Haldol, Remeron, benztropine         Subjective:  Patient doing well today, no complaint.  Physical Exam: Vitals:   02/27/24 1915 02/27/24 1949 02/28/24 0357 02/28/24 0845  BP:  105/69 104/76 102/67  Pulse:  91 90 91  Resp:  18 16 17   Temp:  98.5 F (36.9 C) 98.7 F (37.1 C) 98.3 F (36.8 C)  TempSrc:  Oral  Oral  SpO2:  100% 99% 96%   Weight: 70.4 kg     Height:       General exam: Appears calm and comfortable  Respiratory system: Clear to auscultation. Respiratory effort normal. Cardiovascular system: S1 & S2 heard, RRR. No JVD, murmurs, rubs, gallops or clicks. No pedal edema. Gastrointestinal system: Abdomen is nondistended, soft and nontender. No organomegaly or masses felt. Normal bowel sounds heard. Central nervous system: Alert and oriented. No focal neurological deficits. Extremities: Symmetric 5 x 5 power. Skin: No rashes, lesions or ulcers Psychiatry: Judgement and insight appear normal. Mood & affect appropriate.    Data Reviewed:  There are no new results to review at this time.  Family Communication: None  Disposition: Status is: Inpatient Remains inpatient appropriate because: Severity of disease, inpatient procedure.     Time spent: 35 minutes  Author: Marrion Coy, MD 02/28/2024 11:00 AM  For on call review [email or web address].

## 2024-02-28 NOTE — Progress Notes (Signed)
 [facility]  PROGRESS NOTE   Subjective:   Patient seen at bedside.  Had temporary dialysis catheter placed yesterday.  Was dialyzed last night.  Objective:  Vital signs: Blood pressure 102/80, pulse 95, temperature 98.8 F (37.1 C), temperature source Oral, resp. rate 18, height 5\' 7"  (1.702 m), weight 70.4 kg, SpO2 97%.  Intake/Output Summary (Last 24 hours) at 02/28/2024 1703 Last data filed at 02/28/2024 1400 Gross per 24 hour  Intake 480 ml  Output 900 ml  Net -420 ml   Filed Weights   02/27/24 0500 02/27/24 1611 02/27/24 1915  Weight: 71.2 kg 70 kg 70.4 kg     Physical Exam: General:  No acute distress  Head:  Normocephalic, atraumatic. Moist oral mucosal membranes  Eyes:  Anicteric  Neck:  Supple  Lungs:   Clear to auscultation, normal effort  Heart:  S1S2 no rubs  Abdomen:   Soft, nontender, bowel sounds present  Extremities:  peripheral edema.  Neurologic:  Awake, alert, following commands  Skin:  No lesions  Access:     Basic Metabolic Panel: Recent Labs  Lab 02/26/24 1523 02/27/24 0516 02/27/24 1635  NA 138 133* 130*  K 4.1 4.3 4.6  CL 95* 94* 97*  CO2 26 23 25   GLUCOSE 115* 71 100*  BUN 69* 78* 81*  CREATININE 10.93* 11.52* 12.48*  CALCIUM 9.3 9.0 8.8*  PHOS  --   --  4.2   GFR: Estimated Creatinine Clearance: 5.4 mL/min (A) (by C-G formula based on SCr of 12.48 mg/dL (H)).  Liver Function Tests: Recent Labs  Lab 02/27/24 1635  ALBUMIN 3.2*   No results for input(s): "LIPASE", "AMYLASE" in the last 168 hours. No results for input(s): "AMMONIA" in the last 168 hours.  CBC: Recent Labs  Lab 02/26/24 1523 02/27/24 0516 02/27/24 1635  WBC 6.1 5.3 4.6  HGB 11.9* 10.8* 10.8*  HCT 37.6* 32.8* 33.5*  MCV 97.2 94.0 93.8  PLT 110* 96* 94*     HbA1C: Hgb A1c MFr Bld  Date/Time Value Ref Range Status  06/10/2022 05:21 AM 4.9 4.8 - 5.6 % Final    Comment:    (NOTE) Pre diabetes:          5.7%-6.4%  Diabetes:               >6.4%  Glycemic control for   <7.0% adults with diabetes   11/29/2021 09:29 PM 5.5 4.8 - 5.6 % Final    Comment:    (NOTE)         Prediabetes: 5.7 - 6.4         Diabetes: >6.4         Glycemic control for adults with diabetes: <7.0     Urinalysis: No results for input(s): "COLORURINE", "LABSPEC", "PHURINE", "GLUCOSEU", "HGBUR", "BILIRUBINUR", "KETONESUR", "PROTEINUR", "UROBILINOGEN", "NITRITE", "LEUKOCYTESUR" in the last 72 hours.  Invalid input(s): "APPERANCEUR"    Imaging: No results found.   Medications:     aspirin EC  81 mg Oral Daily   benztropine  0.5 mg Oral BID   Chlorhexidine Gluconate Cloth  6 each Topical Q0600   haloperidol  5 mg Oral Daily   And   haloperidol  10 mg Oral QHS   heparin  5,000 Units Subcutaneous Q8H   insulin aspart  0-5 Units Subcutaneous QHS   insulin aspart  0-9 Units Subcutaneous TID WC   loratadine  10 mg Oral QODAY   mirtazapine  15 mg Oral QHS   pravastatin  40 mg Oral QHS   sevelamer carbonate  1,600 mg Oral TID WC    Assessment/ Plan:     68 y.o. male with medical history significant of ESRD-HD (MWF), FSGS, HTN, HLD, DM, stroke, ICH, thrombocytopenia, who presents with AV fistula occlusion in left arm.  Patient has been on a Monday Wednesday Friday schedule for dialysis.  He had a temporary dialysis catheter placed yesterday.  #1: ESRD: Patient will dialyze last night.  Will dialyze again tomorrow to put him on Monday Wednesday Friday schedule for dialysis.     #2: ANEMIA: Will continue to monitor closely.   #3: MBD: Will follow-up with the PTH, calcium and phosphorus levels.  Continue sevelamer   #4: HTN/VOL: Blood pressure is well-controlled.  #5: Diabetes: Continue insulin as ordered.  Labs and medications reviewed. Will continue to follow along with you.   LOS: 1 Lorain Childes, MD [facility] 3/23/20255:03 PM

## 2024-02-28 NOTE — Plan of Care (Signed)
   Problem: Education: Goal: Ability to describe self-care measures that may prevent or decrease complications (Diabetes Survival Skills Education) will improve Outcome: Progressing Goal: Individualized Educational Video(s) Outcome: Progressing   Problem: Health Behavior/Discharge Planning: Goal: Ability to identify and utilize available resources and services will improve Outcome: Progressing Goal: Ability to manage health-related needs will improve Outcome: Progressing

## 2024-02-28 NOTE — H&P (View-Only) (Signed)
      Daily Progress Note   Assessment/Planning:   POD #1 s/p R fem temp dialysis catheter placement  Pt mostly completed HD yesterday Added pt on angio schedule for tomorrow for LUA AVF declot Preop orders entered Consent ordered All questions answered   Subjective  - 1 Day Post-Op   No complaints, last night's HD run reviewed   Objective   Vitals:   02/27/24 1915 02/27/24 1949 02/28/24 0357 02/28/24 0845  BP:  105/69 104/76 102/67  Pulse:  91 90 91  Resp:  18 16 17   Temp:  98.5 F (36.9 C) 98.7 F (37.1 C) 98.3 F (36.8 C)  TempSrc:  Oral  Oral  SpO2:  100% 99% 96%  Weight: 70.4 kg     Height:         Intake/Output Summary (Last 24 hours) at 02/28/2024 0924 Last data filed at 02/27/2024 1909 Gross per 24 hour  Intake 100 ml  Output 902 ml  Net -802 ml    VASC R groin: bandaged, no bleeding, L groin: no bleeding or hematoma    Laboratory   CBC    Latest Ref Rng & Units 02/27/2024    4:35 PM 02/27/2024    5:16 AM 02/26/2024    3:23 PM  CBC  WBC 4.0 - 10.5 K/uL 4.6  5.3  6.1   Hemoglobin 13.0 - 17.0 g/dL 29.5  62.1  30.8   Hematocrit 39.0 - 52.0 % 33.5  32.8  37.6   Platelets 150 - 400 K/uL 94  96  110     BMET    Component Value Date/Time   NA 130 (L) 02/27/2024 1635   NA 140 03/21/2015 1512   K 4.6 02/27/2024 1635   K 4.0 03/21/2015 1512   CL 97 (L) 02/27/2024 1635   CL 95 (L) 03/21/2015 1512   CO2 25 02/27/2024 1635   CO2 36 (H) 03/21/2015 1512   GLUCOSE 100 (H) 02/27/2024 1635   GLUCOSE 117 (H) 03/21/2015 1512   BUN 81 (H) 02/27/2024 1635   BUN 64 (H) 03/21/2015 1512   CREATININE 12.48 (H) 02/27/2024 1635   CREATININE 7.71 (H) 03/21/2015 1512   CALCIUM 8.8 (L) 02/27/2024 1635   CALCIUM 8.5 (L) 03/21/2015 1512   GFRNONAA 4 (L) 02/27/2024 1635   GFRNONAA 7 (L) 03/21/2015 1512   GFRAA 21 (L) 03/08/2020 1756   GFRAA 8 (L) 03/21/2015 1512     Leonides Sake, MD, FACS, FSVS Covering for [facility]: [phone]  02/28/2024, 9:24 AM

## 2024-02-29 ENCOUNTER — Encounter
Admission: EM | Disposition: A | Discharge status: Home / Self Care | Payer: Self-pay | Source: Home / Self Care | Attending: Internal Medicine

## 2024-02-29 ENCOUNTER — Encounter: Payer: Self-pay | Admitting: Vascular Surgery

## 2024-02-29 DIAGNOSIS — T82898A Other specified complication of vascular prosthetic devices, implants and grafts, initial encounter: Secondary | ICD-10-CM | POA: Diagnosis not present

## 2024-02-29 DIAGNOSIS — D696 Thrombocytopenia, unspecified: Secondary | ICD-10-CM | POA: Diagnosis not present

## 2024-02-29 DIAGNOSIS — T82868A Thrombosis of vascular prosthetic devices, implants and grafts, initial encounter: Principal | ICD-10-CM

## 2024-02-29 DIAGNOSIS — E663 Overweight: Secondary | ICD-10-CM | POA: Insufficient documentation

## 2024-02-29 DIAGNOSIS — N186 End stage renal disease: Secondary | ICD-10-CM | POA: Diagnosis not present

## 2024-02-29 DIAGNOSIS — Z992 Dependence on renal dialysis: Secondary | ICD-10-CM | POA: Diagnosis not present

## 2024-02-29 HISTORY — PX: A/V SHUNT INTERVENTION: CATH118220

## 2024-02-29 LAB — BASIC METABOLIC PANEL
Anion gap: 16 — ABNORMAL HIGH (ref 5–15)
BUN: 74 mg/dL — ABNORMAL HIGH (ref 8–23)
CO2: 27 mmol/L (ref 22–32)
Calcium: 9.1 mg/dL (ref 8.9–10.3)
Chloride: 92 mmol/L — ABNORMAL LOW (ref 98–111)
Creatinine, Ser: 10.73 mg/dL — ABNORMAL HIGH (ref 0.61–1.24)
GFR, Estimated: 5 mL/min — ABNORMAL LOW (ref >60)
Glucose, Bld: 95 mg/dL (ref 70–99)
Potassium: 4.5 mmol/L (ref 3.5–5.1)
Sodium: 135 mmol/L (ref 135–145)

## 2024-02-29 LAB — CBC
HCT: 31.9 % — ABNORMAL LOW (ref 39.0–52.0)
Hemoglobin: 10.5 g/dL — ABNORMAL LOW (ref 13.0–17.0)
MCH: 30.6 pg (ref 26.0–34.0)
MCHC: 32.9 g/dL (ref 30.0–36.0)
MCV: 93 fL (ref 80.0–100.0)
Platelets: 82 10*3/uL — ABNORMAL LOW (ref 150–400)
RBC: 3.43 MIL/uL — ABNORMAL LOW (ref 4.22–5.81)
RDW: 16.3 % — ABNORMAL HIGH (ref 11.5–15.5)
WBC: 5.8 10*3/uL (ref 4.0–10.5)
nRBC: 0 % (ref 0.0–0.2)

## 2024-02-29 LAB — HEPATITIS B SURFACE ANTIGEN: Hepatitis B Surface Ag: NONREACTIVE

## 2024-02-29 LAB — GLUCOSE, CAPILLARY
Glucose-Capillary: 87 mg/dL (ref 70–99)
Glucose-Capillary: 87 mg/dL (ref 70–99)

## 2024-02-29 SURGERY — A/V SHUNT INTERVENTION
Anesthesia: Moderate Sedation

## 2024-02-29 MED ORDER — MIDAZOLAM HCL 2 MG/2ML IJ SOLN
INTRAMUSCULAR | Status: DC | PRN
  Administered 2024-02-29: 2 mg via INTRAVENOUS

## 2024-02-29 MED ORDER — IODIXANOL 320 MG/ML IV SOLN
INTRAVENOUS | Status: DC | PRN
  Administered 2024-02-29: 20 mL

## 2024-02-29 MED ORDER — MIDAZOLAM HCL 2 MG/2ML IJ SOLN
INTRAMUSCULAR | Status: AC
  Filled 2024-02-29: qty 2

## 2024-02-29 MED ORDER — LIDOCAINE-EPINEPHRINE (PF) 1 %-1:200000 IJ SOLN
INTRAMUSCULAR | Status: DC | PRN
  Administered 2024-02-29: 10 mL

## 2024-02-29 MED ORDER — CEFAZOLIN SODIUM-DEXTROSE 1-4 GM/50ML-% IV SOLN
INTRAVENOUS | Status: AC
  Filled 2024-02-29: qty 50

## 2024-02-29 MED ORDER — HEPARIN SODIUM (PORCINE) 1000 UNIT/ML IJ SOLN
INTRAMUSCULAR | Status: DC | PRN
  Administered 2024-02-29: 4000 [IU] via INTRAVENOUS

## 2024-02-29 MED ORDER — HEPARIN SODIUM (PORCINE) 1000 UNIT/ML IJ SOLN
INTRAMUSCULAR | Status: AC
  Filled 2024-02-29: qty 10

## 2024-02-29 MED ORDER — FENTANYL CITRATE (PF) 100 MCG/2ML IJ SOLN
INTRAMUSCULAR | Status: DC | PRN
  Administered 2024-02-29: 50 ug via INTRAVENOUS

## 2024-02-29 MED ORDER — CLOPIDOGREL BISULFATE 75 MG PO TABS: 75.0000 mg | ORAL_TABLET | Freq: Every day | ORAL | 0 refills | Status: AC

## 2024-02-29 MED ORDER — HEPARIN (PORCINE) IN NACL 1000-0.9 UT/500ML-% IV SOLN
INTRAVENOUS | Status: DC | PRN
  Administered 2024-02-29: 500 mL

## 2024-02-29 MED ORDER — CHLORHEXIDINE GLUCONATE CLOTH 2 % EX PADS: 6.0000 | MEDICATED_PAD | Freq: Every day | CUTANEOUS | Status: DC

## 2024-02-29 MED ORDER — CEFAZOLIN SODIUM-DEXTROSE 1-4 GM/50ML-% IV SOLN
1.0000 g | INTRAVENOUS | Status: AC
  Administered 2024-02-29: 1 g via INTRAVENOUS

## 2024-02-29 MED ORDER — FENTANYL CITRATE PF 50 MCG/ML IJ SOSY
PREFILLED_SYRINGE | INTRAMUSCULAR | Status: AC
  Filled 2024-02-29: qty 1

## 2024-02-29 MED ORDER — CLOPIDOGREL BISULFATE 75 MG PO TABS
75.0000 mg | ORAL_TABLET | Freq: Every day | ORAL | Status: DC
  Administered 2024-02-29: 75 mg via ORAL
  Filled 2024-02-29: qty 1

## 2024-02-29 SURGICAL SUPPLY — 14 items
CANNULA 5F STIFF (CANNULA) IMPLANT
CATH ANGIO 5F PIGTAIL 65CM (CATHETERS) IMPLANT
CATH EMBOLECTOMY 5FR (BALLOONS) IMPLANT
CATH THROMBEC 7F 65 CLEANER15 (CATHETERS) IMPLANT
COVER PROBE ULTRASOUND 5X96 (MISCELLANEOUS) IMPLANT
DRAPE BRACHIAL (DRAPES) IMPLANT
PACK ANGIOGRAPHY (CUSTOM PROCEDURE TRAY) IMPLANT
SHEATH BRITE TIP 5FRX11 (SHEATH) IMPLANT
SHEATH BRITE TIP 6FRX5.5 (SHEATH) IMPLANT
SHEATH BRITE TIP 7FRX5.5 (SHEATH) IMPLANT
SUT MNCRL AB 4-0 PS2 18 (SUTURE) IMPLANT
SYR MEDRAD MARK 7 150ML (SYRINGE) IMPLANT
TUBING CONTRAST HIGH PRESS 72 (TUBING) IMPLANT
WIRE J 3MM .035X145CM (WIRE) IMPLANT

## 2024-02-29 NOTE — NC FL2 (Signed)
 [place] MEDICAID [id number] LEVEL OF CARE FORM     IDENTIFICATION  Patient Name: Lawrence [name] Birthdate: 16-Jan-1956 Sex: male Admission Date (Current Location): 02/26/2024  [facility] [place] Number:  Chiropodist and Address:         Provider Number: [id number]  Attending Physician Name and Address:  Marrion Coy, MD  Relative Name and Phone Number:       Current Level of Care: Hospital Recommended Level of Care: Family Care Home Prior Approval Number:    Date Approved/Denied:   PASRR Number:    Discharge Plan:      Current Diagnoses: Patient Active Problem List   Diagnosis Date Noted   Overweight (BMI 25.0-29.9) 02/29/2024   AV fistula occlusion, initial encounter (HCC) 02/26/2024   Type II diabetes mellitus with renal manifestations (HCC) 02/26/2024   End stage renal disease (HCC) 02/26/2024   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HCC) 06/10/2022   Clotted dialysis access (HCC) 06/09/2022   Arteriovenous fistula occlusion (HCC)    Dialysis AV fistula malfunction (HCC) 11/29/2021   Fluid overload, unspecified 05/30/2020   Schizophrenia (HCC) 03/09/2020   Anuria 10/02/2019   Xerosis cutis 10/02/2019   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   Leukopenia 04/17/2019   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   Elevated ferritin 04/17/2019   Hypokalemia 10/14/2017   ESRD on hemodialysis (HCC) 10/14/2017   Thrombocytopenia (HCC) 10/14/2017   Acute urinary tract infection 10/13/2017   History of CVA (cerebrovascular accident) 10/13/2017   Chronic schizoaffective disorder (HCC) 04/09/2016   Schizoaffective disorder (HCC) 04/09/2016   Complication of diabetes mellitus (HCC) 03/09/2016   Stage 5 chronic kidney disease (HCC) 03/09/2016   Thyroid nodule 03/09/2016   Aftercare including intermittent dialysis (HCC) 09/25/2015   Encounter for immunization 07/03/2015   Moderate protein-calorie malnutrition (HCC) 05/17/2015   Complication of vascular dialysis  catheter 04/30/2015   Chest pain, unspecified 04/26/2015   Coagulation defect, unspecified (HCC) 04/26/2015   Disorder of phosphorus metabolism, unspecified 04/26/2015   Hypoglycemia, unspecified 04/26/2015   Iron deficiency anemia, unspecified 04/26/2015   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HCC) 04/26/2015   Pain, unspecified 04/26/2015   Pruritus, unspecified 04/26/2015   Shortness of breath 04/26/2015   Type 2 diabetes mellitus without complications (HCC) 04/26/2015   Dyslipidemia 04/26/2015   Essential hypertension 04/26/2015   Schizophrenia, unspecified (HCC) 04/26/2015   Thrombocytopenia, unspecified (HCC) 04/26/2015   ESRD on dialysis (HCC) 04/16/2015   Goiter 02/27/2015   Dry mouth 02/27/2015   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   Controlled type 2 diabetes mellitus without complication, without long-term current use of insulin (HCC) 01/21/2015   Benign hypertension with CKD (chronic kidney disease) stage IV (HCC) 01/21/2015   Encounter for screening for malignant neoplasm of colon 08/02/2014   Vitamin D deficiency 05/12/2013   Cerebrovascular accident (CVA) (HCC) 04/19/2013   Type 2 diabetes mellitus with hyperlipidemia (HCC) 04/19/2013    Orientation RESPIRATION BLADDER Height & Weight        Normal  (anuric) Weight: 76 kg Height:  5\' 7"  (170.2 cm)  BEHAVIORAL SYMPTOMS/MOOD NEUROLOGICAL BOWEL NUTRITION STATUS      Continent Diet (renal)  AMBULATORY STATUS COMMUNICATION OF NEEDS Skin   Independent Verbally Surgical wounds                       Personal Care Assistance Level of Assistance              Functional Limitations Info  SPECIAL CARE FACTORS FREQUENCY                       Contractures Contractures Info: Not present    Additional Factors Info  Code Status, Allergies Code Status Info: full Allergies Info: NSAIDS           Medication List       STOP taking these medications      amLODipine 10 MG tablet Commonly known as: NORVASC    carvedilol 25 MG tablet Commonly known as: COREG    Coenzyme Q10 100 MG capsule    glipiZIDE 2.5 MG 24 hr tablet Commonly known as: GLUCOTROL XL    midodrine 5 MG tablet Commonly known as: PROAMATINE           TAKE these medications     aspirin EC 81 MG tablet Take 81 mg by mouth daily.    benztropine 0.5 MG tablet Commonly known as: COGENTIN Take 0.5 mg by mouth 2 (two) times daily.    cetirizine 10 MG tablet Commonly known as: ZYRTEC Take 10 mg by mouth every other day.    clopidogrel 75 MG tablet Commonly known as: PLAVIX Take 1 tablet (75 mg total) by mouth daily. Start taking on: March 01, 2024    Daily-Vite Tabs Take 1 tablet by mouth daily.    diphenhydrAMINE 25 mg capsule Commonly known as: BENADRYL Take 25 mg by mouth See admin instructions. Take 1 capsule (25mg ) by mouth nightly as needed for sleep - may take 1 capsule (25mg ) by mouth during the daytime as needed for itching    DIPHENHYDRAMINE-ZINC OXIDE EX Apply 1 application. topically See admin instructions. Apply small amount of affected area three times daily as needed    docusate sodium 100 MG capsule Commonly known as: COLACE Take 100 mg by mouth 2 (two) times daily.    EasyMax Test test strip Generic drug: glucose blood EasyMax    feeding supplement Liqd Take 1 Container by mouth See admin instructions. Take 1 container by mouth three times a week or as needed of Nutritional Supplement    fluticasone 50 MCG/ACT nasal spray Commonly known as: FLONASE Place 2 sprays into both nostrils daily.    haloperidol 10 MG tablet Commonly known as: HALDOL Take 5-10 mg by mouth See admin instructions. Take 5 mg in the morning and 10 mg at bedtime    lidocaine-prilocaine cream Commonly known as: EMLA Apply 1 application topically 3 (three) times a week. Apply over dialysis shunt 30 minutes prior to dialysis.    mirtazapine 15 MG  tablet Commonly known as: REMERON Take 15 mg by mouth at bedtime.    niacin 1000 MG CR tablet Commonly known as: NIASPAN Take 1,000 mg by mouth at bedtime.    polyethylene glycol 17 g packet Commonly known as: MIRALAX / GLYCOLAX Take 17 g by mouth daily as needed (Constipation). Mix 17 g (1 capful) in to 8 ounces of Juice/Water    pravastatin 40 MG tablet Commonly known as: PRAVACHOL Take 40 mg by mouth at bedtime.    sevelamer carbonate 800 MG tablet Commonly known as: RENVELA Take 2 tablets (1,600 mg total) by mouth 3 (three) times daily with meals.    Vitamin D3 125 MCG (5000 UT) Caps Take 5,000 Units by mouth once a week.     Relevant Imaging Results:  Relevant Lab Results:   Additional Information [id number]  Chapman Fitch, RN

## 2024-02-29 NOTE — Progress Notes (Signed)
 Patient returned from procedure in vascular lab

## 2024-02-29 NOTE — Progress Notes (Signed)
 [facility]  PROGRESS NOTE   Subjective:   Patient seen resting in bed NPO for vascular procedure  If successful, will dialyze after thrombectomy.   Objective:  Vital signs: Blood pressure 103/71, pulse 94, temperature 98 F (36.7 C), temperature source Oral, resp. rate (!) 22, height 5\' 7"  (1.702 m), weight 76 kg, SpO2 99%. No intake or output data in the 24 hours ending 02/29/24 1504  Filed Weights   02/27/24 1611 02/27/24 1915 02/29/24 0500  Weight: 70 kg 70.4 kg 76 kg     Physical Exam: General:  No acute distress  Head:  Normocephalic, atraumatic. Moist oral mucosal membranes  Eyes:  Anicteric  Lungs:   Clear to auscultation, normal effort  Heart:  S1S2 no rubs  Abdomen:   Soft, nontender, bowel sounds present  Extremities:  No peripheral edema.  Neurologic:  Awake, alert, following commands  Skin:  No lesions       Basic Metabolic Panel: Recent Labs  Lab 02/26/24 1523 02/27/24 0516 02/27/24 1635 02/29/24 0527  NA 138 133* 130* 135  K 4.1 4.3 4.6 4.5  CL 95* 94* 97* 92*  CO2 26 23 25 27   GLUCOSE 115* 71 100* 95  BUN 69* 78* 81* 74*  CREATININE 10.93* 11.52* 12.48* 10.73*  CALCIUM 9.3 9.0 8.8* 9.1  PHOS  --   --  4.2  --    GFR: Estimated Creatinine Clearance: 6.2 mL/min (A) (by C-G formula based on SCr of 10.73 mg/dL (H)).  Liver Function Tests: Recent Labs  Lab 02/27/24 1635  ALBUMIN 3.2*   No results for input(s): "LIPASE", "AMYLASE" in the last 168 hours. No results for input(s): "AMMONIA" in the last 168 hours.  CBC: Recent Labs  Lab 02/26/24 1523 02/27/24 0516 02/27/24 1635 02/29/24 1426  WBC 6.1 5.3 4.6 5.8  HGB 11.9* 10.8* 10.8* 10.5*  HCT 37.6* 32.8* 33.5* 31.9*  MCV 97.2 94.0 93.8 93.0  PLT 110* 96* 94* 82*     HbA1C: Hgb A1c MFr Bld  Date/Time Value Ref Range Status  06/10/2022 05:21 AM 4.9 4.8 - 5.6 % Final    Comment:    (NOTE) Pre diabetes:          5.7%-6.4%  Diabetes:               >6.4%  Glycemic control for   <7.0% adults with diabetes   11/29/2021 09:29 PM 5.5 4.8 - 5.6 % Final    Comment:    (NOTE)         Prediabetes: 5.7 - 6.4         Diabetes: >6.4         Glycemic control for adults with diabetes: <7.0     Urinalysis: No results for input(s): "COLORURINE", "LABSPEC", "PHURINE", "GLUCOSEU", "HGBUR", "BILIRUBINUR", "KETONESUR", "PROTEINUR", "UROBILINOGEN", "NITRITE", "LEUKOCYTESUR" in the last 72 hours.  Invalid input(s): "APPERANCEUR"    Imaging: PERIPHERAL VASCULAR CATHETERIZATION Result Date: 02/29/2024 See surgical note for result.    Medications:     aspirin EC  81 mg Oral Daily   benztropine  0.5 mg Oral BID   [START ON 03/01/2024] Chlorhexidine Gluconate Cloth  6 each Topical Q0600   clopidogrel  75 mg Oral Daily   haloperidol  5 mg Oral Daily   And   haloperidol  10 mg Oral QHS   heparin  5,000 Units Subcutaneous Q8H   insulin aspart  0-5 Units Subcutaneous QHS   insulin aspart  0-9 Units Subcutaneous TID WC  loratadine  10 mg Oral QODAY   mirtazapine  15 mg Oral QHS   pravastatin  40 mg Oral QHS   sevelamer carbonate  1,600 mg Oral TID WC    Assessment/ Plan:     68 y.o. male with medical history significant of ESRD-HD (MWF), FSGS, HTN, HLD, DM, stroke, ICH, thrombocytopenia, who presents with AV fistula occlusion in left arm.  Patient has been on a Monday Wednesday Friday schedule for dialysis.  He had a temporary dialysis catheter placed yesterday.  #1: Malfunctioning dialysis access, thrombosed. Vascular surgery consulted and successfully performed thrombectomy. Will use fistula with dialysis today.   #2: End stage renal disease on hemodialysis Will receive dialysis today and evaluate fistula   #2: ANEMIA with chronic kidney disease: Hgb 10.5, acceptable   #3: Secondary Hyperparathyroidism: with outpatient labs:  Lab Results  Component Value Date   CALCIUM 9.1 02/29/2024   PHOS 4.2 02/27/2024   Prescribed sevelamer  with meals.    #4: Hypertension with chronic kidney disease. Blood pressure 104/68 during dialysis.   #5: Diabetes: Continue insulin as ordered.    LOS: 2 [facility] 3/24/20253:04 PM

## 2024-02-29 NOTE — Op Note (Signed)
 [facility]    OPERATIVE NOTE   PROCEDURE: 1.  Left brachiocephalic AV fistula and jump graft cannulation under ultrasound guidance in both a retrograde and then antegrade fashion crossing 2.  Left arm shuntogram and central venogram 3.  Mechanical thrombectomy to the left brachiocephalic AV fistula, left brachial artery, and jump graft with the 7 [place] cleaner device 4.  Fogarty embolectomy for residual arterial plug   PRE-OPERATIVE DIAGNOSIS: 1. ESRD 2.  Thrombosed left brachiocephalic AV fistula and Artegraft jump graft  POST-OPERATIVE DIAGNOSIS: same as above   SURGEON: Festus Barren, MD  ANESTHESIA: local with Moderate Conscious Sedation for approximately 27 minutes using 2 mg of Versed and 50 mcg of Fentanyl  ESTIMATED BLOOD LOSS: 10 cc  FINDING(S): Thrombosed left brachiocephalic AV fistula and jump graft.  Able to be opened with cleaner thrombectomy device.  SPECIMEN(S):  None  CONTRAST: 20 cc  FLUORO TIME: 6.7 minutes  INDICATIONS: Patient is a 68 y.o.male who presents with a thrombosed left brachiocephalic AV fistula and Artegraft arteriovenous jump graft.  The patient is scheduled for an attempted declot and shuntogram.  The patient is aware the risks include but are not limited to: bleeding, infection, thrombosis of the cannulated access, and possible anaphylactic reaction to the contrast.  The patient is aware of the risks of the procedure and elects to proceed forward.  DESCRIPTION: After full informed written consent was obtained, the patient was brought back to the angiography suite and placed supine upon the angiography table.  The patient was connected to monitoring equipment. Moderate conscious sedation was administered with a face to face encounter with the patient throughout the procedure with my supervision of the RN administering medicines and monitoring the patient's vital signs, pulse oximetry, telemetry and mental status throughout  from the start of the procedure until the patient was taken to the recovery room. The left arm was prepped and draped in the standard fashion for a percutaneous access intervention.  Under ultrasound guidance, the left brachiocephalic AV fistula and Artegraft jump graft was cannulated with a micropuncture needle under direct ultrasound guidance due to the pulseless nature of the graft in both an antegrade and a retrograde fashion crossing, and permanent images were performed.  The microwire was advanced and the needle was exchanged for the a microsheath.  I then upsized to a 7 Fr Sheath and imaging was performed.  Hand injections were completed to image the access including the central venous system. This demonstrated no flow within the fistula or Artegraft jump graft.  Based on the images, this patient will need extensive treatment to salvage the graft. I then gave the patient 4000 units of intravenous heparin.  I then used the cleaner device in both an antegrade and retrograde fashion to perform mechanical thrombectomy in the left brachiocephalic AV fistula, the Artegraft jump graft, the cephalic vein all the way to the cephalic vein subclavian vein confluence, and into the brachial artery.  Multiple passes were made in both direction.  I also used a Fogarty embolectomy balloon and made 4 passes through the retrograde sheath to help clear the anastomotic arterial plug.  After multiple passes with the Fogarty embolectomy balloon and the cleaner device, there was clearance of the AV fistula in the Artegraft jump graft.  There was mild narrowing in the cephalic vein which may have been in part been due to spasm after using the cleaner device particularly at the subclavian vein cephalic vein confluence.  There is now an  excellent thrill and good flow through the access.  Based on the completion imaging, no further intervention is necessary.  A 4-0 Monocryl purse-string suture was sewn around the sheath.  The  sheath was removed while tying down the suture.  A sterile bandage was applied to the puncture site.  COMPLICATIONS: None  CONDITION: Stable   Festus Barren 02/29/2024 10:28 AM   This note was created with Dragon Medical transcription system. Any errors in dictation are purely unintentional.

## 2024-02-29 NOTE — Progress Notes (Signed)
 Order received from Dr Chipper Herb to remove the temporary femoral line

## 2024-02-29 NOTE — Discharge Summary (Signed)
 Physician Discharge Summary   Patient: Lawrence [name] MRN: [id number] DOB: [birth year]  Admit date:     02/26/2024  Discharge date: 02/29/24  Discharge Physician: Marrion Coy   PCP: [name], MD   Recommendations at discharge:   Follow-up with PCP in 1 week.  Discharge Diagnoses: Principal Problem:   AV fistula occlusion, initial encounter [facility]) Active Problems:   ESRD on dialysis [facility])   Essential hypertension   Type II diabetes mellitus with renal manifestations (HCC)   Cerebrovascular accident (CVA) (HCC)   Dyslipidemia   Thrombocytopenia, unspecified (HCC)   Chronic schizoaffective disorder (HCC)   End stage renal disease (HCC)   Overweight (BMI 25.0-29.9)  Resolved Problems:   * No resolved hospital problems. *  Hospital Course: Lawrence [name] is a 68 y.o. male with medical history significant of ESRD-HD (MWF), FSGS, HTN, HLD, DM, stroke, ICH, thrombocytopenia, who presents with AV fistula occlusion in left arm.  Patient is seen by nephrology and vascular surgery, a temporary dialysis catheter be placed today and start dialysis. Patient went to the OR on 3/24, had a mechanical thrombectomy to the AV fistula.  Patient will have dialysis today and discharge after.  Assessment and Plan: AV fistula occlusion, initial encounter [facility]):  ESRD on dialysis (MWF):  Patient had temporal femoral dialysis catheter performed on 3/22, started on dialysis. Thrombectomy was performed to AV fistula today, patient to be dialyzed afterwards, medically stable for discharge.   Essential hypertension:  Patient was not taking any blood pressure medicine at home, actually able to take the midodrine.  Currently blood pressure is better, will discontinue midodrine.  No need for blood pressure medicine yet.     Type II diabetes mellitus with renal manifestations [facility]):  Well-controlled, continue sliding scale insulin.   Cerebrovascular accident (CVA) (HCC) -Aspirin, pravastatin    Dyslipidemia -Pravastatin   Thrombocytopenia, unspecified (HCC):  Chronic.   Chronic schizoaffective disorder [facility]): Patient is calm now. -Continue home Haldol, Remeron, benztropine         Consultants: Nephrology, vascular surgery. Procedures performed: Temporary Dialysis catheter, AV fistula thrombectomy. Disposition:  Previous facility Diet recommendation:  Discharge Diet Orders (From admission, onward)     Start     Ordered   02/29/24 0000  Diet general       Comments: Renal diet   02/29/24 1302           Renal diet DISCHARGE MEDICATION: Allergies as of 02/29/2024       Reactions   Nsaids Other (See Comments)   Cannot take due to renal disease        Medication List     STOP taking these medications    amLODipine 10 MG tablet Commonly known as: NORVASC   carvedilol 25 MG tablet Commonly known as: COREG   Coenzyme Q10 100 MG capsule   glipiZIDE 2.5 MG 24 hr tablet Commonly known as: GLUCOTROL XL   midodrine 5 MG tablet Commonly known as: PROAMATINE       TAKE these medications    aspirin EC 81 MG tablet Take 81 mg by mouth daily.   benztropine 0.5 MG tablet Commonly known as: COGENTIN Take 0.5 mg by mouth 2 (two) times daily.   cetirizine 10 MG tablet Commonly known as: ZYRTEC Take 10 mg by mouth every other day.   clopidogrel 75 MG tablet Commonly known as: PLAVIX Take 1 tablet (75 mg total) by mouth daily. Start taking on: March 01, 2024   Daily-Vite Tabs Take  1 tablet by mouth daily.   diphenhydrAMINE 25 mg capsule Commonly known as: BENADRYL Take 25 mg by mouth See admin instructions. Take 1 capsule (25mg ) by mouth nightly as needed for sleep - may take 1 capsule (25mg ) by mouth during the daytime as needed for itching   DIPHENHYDRAMINE-ZINC OXIDE EX Apply 1 application. topically See admin instructions. Apply small amount of affected area three times daily as needed   docusate sodium 100 MG capsule Commonly known as:  COLACE Take 100 mg by mouth 2 (two) times daily.   EasyMax Test test strip Generic drug: glucose blood EasyMax   feeding supplement Liqd Take 1 Container by mouth See admin instructions. Take 1 container by mouth three times a week or as needed of Nutritional Supplement   fluticasone 50 MCG/ACT nasal spray Commonly known as: FLONASE Place 2 sprays into both nostrils daily.   haloperidol 10 MG tablet Commonly known as: HALDOL Take 5-10 mg by mouth See admin instructions. Take 5 mg in the morning and 10 mg at bedtime   lidocaine-prilocaine cream Commonly known as: EMLA Apply 1 application topically 3 (three) times a week. Apply over dialysis shunt 30 minutes prior to dialysis.   mirtazapine 15 MG tablet Commonly known as: REMERON Take 15 mg by mouth at bedtime.   niacin 1000 MG CR tablet Commonly known as: NIASPAN Take 1,000 mg by mouth at bedtime.   polyethylene glycol 17 g packet Commonly known as: MIRALAX / GLYCOLAX Take 17 g by mouth daily as needed (Constipation). Mix 17 g (1 capful) in to 8 ounces of Juice/Water   pravastatin 40 MG tablet Commonly known as: PRAVACHOL Take 40 mg by mouth at bedtime.   sevelamer carbonate 800 MG tablet Commonly known as: RENVELA Take 2 tablets (1,600 mg total) by mouth 3 (three) times daily with meals.   Vitamin D3 125 MCG (5000 UT) Caps Take 5,000 Units by mouth once a week.        Follow-up Information     [name], MD Follow up in 1 week(s).   Specialty: Family Medicine Contact information: [address] [place] [address] [phone]                Discharge Exam: Filed Weights   02/27/24 1611 02/27/24 1915 02/29/24 0500  Weight: 70 kg 70.4 kg 76 kg   General exam: Appears calm and comfortable  Respiratory system: Clear to auscultation. Respiratory effort normal. Cardiovascular system: S1 & S2 heard, RRR. No JVD, murmurs, rubs, gallops or clicks. No pedal edema. Gastrointestinal system:  Abdomen is nondistended, soft and nontender. No organomegaly or masses felt. Normal bowel sounds heard. Central nervous system: Alert and oriented. No focal neurological deficits. Extremities: Symmetric 5 x 5 power. Skin: No rashes, lesions or ulcers Psychiatry: Judgement and insight appear normal. Mood & affect appropriate.    Condition at discharge: good  The results of significant diagnostics from this hospitalization (including imaging, microbiology, ancillary and laboratory) are listed below for reference.   Imaging Studies: PERIPHERAL VASCULAR CATHETERIZATION Result Date: 02/29/2024 See surgical note for result.  VAS US DUPLEX DIALYSIS ACCESS (AVF,AVG) Result Date: 02/04/2024 DIALYSIS ACCESS Patient Name:  AFFAN CALLOW  Date of Exam:   02/04/2024 Medical Rec #: [id number]         Accession #:    [id number] Date of Birth: [birth year]         Patient Gender: M Patient Age:   62 years Exam [facility] Procedure:      VAS US DUPLEX DIALYSIS ACCESS (AVF, AVG) Referring Phys: Festus Barren --------------------------------------------------------------------------------  Reason for Exam: Routine follow up. Access Site: Left Upper Extremity. Access Type: Brachial-cephalic AVF. History: 09/01/2023 Mechanical thrombectomy of AVF          03/29/15: Left brachial-cephalic AVF created;          02/24/18: PTA of mid AVF;          09/03/2020: PTA of confluence          06/27/2021: PTA of confluence          12/04/2021: Jump graft revision of left brachial-cephalic AVF with          thrombectomy and ligation of aneurysmal AVF;          03/25/2022: Left graft thrombectomy/PTA;          06/12/2022: Left AVF thrombectomy/PTA;          06/12/2023: Left AVF thrombectomy/angioplasty at [facility];. Performing Technologist: Hardie Lora RVT  Examination Guidelines: A complete evaluation includes B-mode imaging, spectral Doppler, color Doppler, and power Doppler as needed of all accessible portions of each  vessel. Unilateral testing is considered an integral part of a complete examination. Limited examinations for reoccurring indications may be performed as noted.  Findings: +--------------------+----------+-----------------+--------+ AVF                 PSV (cm/s)Flow Vol (mL/min)Comments +--------------------+----------+-----------------+--------+ Native artery inflow   103          2047                +--------------------+----------+-----------------+--------+ AVF Anastomosis        326                              +--------------------+----------+-----------------+--------+  +---------------+----------+-------------+----------+--------+ OUTFLOW VEIN   PSV (cm/s)Diameter (cm)Depth (cm)Describe +---------------+----------+-------------+----------+--------+ Subclavian vein   179                                    +---------------+----------+-------------+----------+--------+ Confluence        453        0.41               stenotic +---------------+----------+-------------+----------+--------+ Clavicle          238        0.74                        +---------------+----------+-------------+----------+--------+ Shoulder          213        0.97                        +---------------+----------+-------------+----------+--------+ Prox UA           173        1.18                        +---------------+----------+-------------+----------+--------+ Mid UA            194        1.45                        +---------------+----------+-------------+----------+--------+ Dist UA           116  1.02                        +---------------+----------+-------------+----------+--------+ AC Fossa          161        1.52                        +---------------+----------+-------------+----------+--------+ Evidence of dilatated area, with no internal flow, just distal to the current anastomosis which appears to correlates with previously ligated AVF.   +---------------+-------------+----------+---------+----------+----------------+                Diameter (cm)Depth (cm)BranchingPSV (cm/s)  Flow Volume                                                                 (ml/min)     +---------------+-------------+----------+---------+----------+----------------+ Mid left                                          281                     brachial artery                                                           +---------------+-------------+----------+---------+----------+----------------+  Summary: Patent arteriovenous fistula. Arteriovenous fistula-Stenosis noted in the confluence.  Tortuous brachial artery, mid upper arm.  *See table(s) above for measurements and observations.  Diagnosing physician: Levora Dredge MD Electronically signed by Levora Dredge MD on 02/04/2024 at 4:31:19 PM.   --------------------------------------------------------------------------------   Final     Microbiology: Results for orders placed or performed during the hospital encounter of 11/29/21  Resp Panel by RT-PCR (Flu A&B, Covid) Nasopharyngeal Swab     Status: None   Collection Time: 11/29/21  6:45 PM   Specimen: Nasopharyngeal Swab; Nasopharyngeal(NP) swabs in vial transport medium  Result Value Ref Range Status   SARS Coronavirus 2 by RT PCR NEGATIVE NEGATIVE Final    Comment: (NOTE) SARS-CoV-2 target nucleic acids are NOT DETECTED.  The SARS-CoV-2 RNA is generally detectable in upper respiratory specimens during the acute phase of infection. The lowest concentration of SARS-CoV-2 viral copies this assay can detect is 138 copies/mL. A negative result does not preclude SARS-Cov-2 infection and should not be used as the sole basis for treatment or other patient management decisions. A negative result may occur with  improper specimen collection/handling, submission of specimen other than nasopharyngeal swab, presence of viral mutation(s)  within the areas targeted by this assay, and inadequate number of viral copies(<138 copies/mL). A negative result must be combined with clinical observations, patient history, and epidemiological information. The expected result is Negative.  Fact Sheet for Patients:  BloggerCourse.com  Fact Sheet for Healthcare Providers:  SeriousBroker.it  This test is no t yet approved or cleared by the [place] FDA and  has been authorized for detection and/or diagnosis of SARS-CoV-2 by FDA under an Emergency Use Authorization (EUA).  This EUA will remain  in effect (meaning this test can be used) for the duration of the COVID-19 declaration under Section 564(b)(1) of the Act, 21 U.S.C.section 360bbb-3(b)(1), unless the authorization is terminated  or revoked sooner.       Influenza A by PCR NEGATIVE NEGATIVE Final   Influenza B by PCR NEGATIVE NEGATIVE Final    Comment: (NOTE) The Xpert Xpress SARS-CoV-2/FLU/RSV plus assay is intended as an aid in the diagnosis of influenza from Nasopharyngeal swab specimens and should not be used as a sole basis for treatment. Nasal washings and aspirates are unacceptable for Xpert Xpress SARS-CoV-2/FLU/RSV testing.  Fact Sheet for Patients: BloggerCourse.com  Fact Sheet for Healthcare Providers: SeriousBroker.it  This test is not yet approved or cleared by the [place] FDA and has been authorized for detection and/or diagnosis of SARS-CoV-2 by FDA under an Emergency Use Authorization (EUA). This EUA will remain in effect (meaning this test can be used) for the duration of the COVID-19 declaration under Section 564(b)(1) of the Act, 21 U.S.C. section 360bbb-3(b)(1), unless the authorization is terminated or revoked.  Performed at [facility], [address] [place], [place] [address]     Labs: CBC: Recent Labs  Lab  02/26/24 1523 02/27/24 0516 02/27/24 1635  WBC 6.1 5.3 4.6  HGB 11.9* 10.8* 10.8*  HCT 37.6* 32.8* 33.5*  MCV 97.2 94.0 93.8  PLT 110* 96* 94*   Basic Metabolic Panel: Recent Labs  Lab 02/26/24 1523 02/27/24 0516 02/27/24 1635 02/29/24 0527  NA 138 133* 130* 135  K 4.1 4.3 4.6 4.5  CL 95* 94* 97* 92*  CO2 26 23 25 27   GLUCOSE 115* 71 100* 95  BUN 69* 78* 81* 74*  CREATININE 10.93* 11.52* 12.48* 10.73*  CALCIUM 9.3 9.0 8.8* 9.1  PHOS  --   --  4.2  --    Liver Function Tests: Recent Labs  Lab 02/27/24 1635  ALBUMIN 3.2*   CBG: Recent Labs  Lab 02/28/24 1142 02/28/24 1718 02/28/24 2039 02/29/24 0917 02/29/24 1144  GLUCAP 140* 115* 114* 87 87    Discharge time spent: greater than 30 minutes.  Signed: Marrion Coy, MD [facility] 02/29/2024

## 2024-02-29 NOTE — Progress Notes (Signed)
 Hemodialysis Note:  Received patient in bed to unit. Alert and oriented. Informed consent singed and in chart.  Treatment initiated: 1428 Treatment completed: 1805  Access used: Left Fistula Access issues: None  Patient tolerated well. Transported back to room, alert without acute distress. Report given to patient's RN.  Total UF removed: 1.5 Liters Medications given: None  Post HD weight: 74.5 Kg  [facility]

## 2024-02-29 NOTE — Plan of Care (Signed)

## 2024-02-29 NOTE — Plan of Care (Signed)

## 2024-02-29 NOTE — TOC Initial Note (Signed)
 Transition of Care [facility]) - Initial/Assessment Note    Patient Details  Name: Lawrence [name] MRN: [id number] Date of Birth: [birth year]  [facility] CM/SW Contact:    Chapman Fitch, RN Phone Number: 02/29/2024, 4:11 PM  Clinical Narrative:                  Patient to return to [facility] today  - confirmed with Alfreda at [facility] that patient is his on guardian.  FL2 and dc summary placed in DC packet per Alfreda requested  Bed side RN to call report and arrange transport with Alfreda when patient returns to floor from HD     Patient Goals and CMS Choice            Expected Discharge Plan and Services         Expected Discharge Date: 02/29/24                                    Prior Living Arrangements/Services                       Activities of Daily Living   ADL Screening (condition at time of admission) Independently performs ADLs?: Yes (appropriate for developmental age) Is the patient deaf or have difficulty hearing?: No Does the patient have difficulty seeing, even when wearing glasses/contacts?: No Does the patient have difficulty concentrating, remembering, or making decisions?: No  Permission Sought/Granted                  Emotional Assessment              Admission diagnosis:  ESRD on hemodialysis (HCC) [N18.6, Z99.2] Malfunction of arteriovenous dialysis fistula, initial encounter (HCC) [T82.590A] AV fistula occlusion, initial encounter (HCC) [O13.086V] Patient Active Problem List   Diagnosis Date Noted   Overweight (BMI 25.0-29.9) 02/29/2024   AV fistula occlusion, initial encounter (HCC) 02/26/2024   Type II diabetes mellitus with renal manifestations (HCC) 02/26/2024   End stage renal disease (HCC) 02/26/2024   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HCC) 06/10/2022   Clotted dialysis access (HCC) 06/09/2022   Arteriovenous fistula occlusion (HCC)    Dialysis AV fistula malfunction (HCC) 11/29/2021    Fluid overload, unspecified 05/30/2020   Schizophrenia (HCC) 03/09/2020   Anuria 10/02/2019   Xerosis cutis 10/02/2019   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   Leukopenia 04/17/2019   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   Elevated ferritin 04/17/2019   Hypokalemia 10/14/2017   ESRD on hemodialysis (HCC) 10/14/2017   Thrombocytopenia (HCC) 10/14/2017   Acute urinary tract infection 10/13/2017   History of CVA (cerebrovascular accident) 10/13/2017   Chronic schizoaffective disorder (HCC) 04/09/2016   Schizoaffective disorder (HCC) 04/09/2016   Complication of diabetes mellitus (HCC) 03/09/2016   Stage 5 chronic kidney disease (HCC) 03/09/2016   Thyroid nodule 03/09/2016   Aftercare including intermittent dialysis (HCC) 09/25/2015   Encounter for immunization 07/03/2015   Moderate protein-calorie malnutrition (HCC) 05/17/2015   Complication of vascular dialysis catheter 04/30/2015   Chest pain, unspecified 04/26/2015   Coagulation defect, unspecified (HCC) 04/26/2015   Disorder of phosphorus metabolism, unspecified 04/26/2015   Hypoglycemia, unspecified 04/26/2015   Iron deficiency anemia, unspecified 04/26/2015   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HCC) 04/26/2015   Pain, unspecified 04/26/2015   Pruritus, unspecified 04/26/2015   Shortness of breath 04/26/2015   Type 2 diabetes mellitus without complications (  HCC) 04/26/2015   Dyslipidemia 04/26/2015   Essential hypertension 04/26/2015   Schizophrenia, unspecified (HCC) 04/26/2015   Thrombocytopenia, unspecified (HCC) 04/26/2015   ESRD on dialysis (HCC) 04/16/2015   Goiter 02/27/2015   Dry mouth 02/27/2015   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   Controlled type 2 diabetes mellitus without complication, without long-term current use of insulin (HCC) 01/21/2015   Benign hypertension with CKD (chronic kidney disease) stage IV (HCC) 01/21/2015   Encounter for screening for malignant  neoplasm of colon 08/02/2014   Vitamin D deficiency 05/12/2013   Cerebrovascular accident (CVA) (HCC) 04/19/2013   Type 2 diabetes mellitus with hyperlipidemia (HCC) 04/19/2013   PCP:  [name], MD Pharmacy:   [facility] [place] - [address] [place] [address] Phone: [phone] Fax: [phone]     Social Drivers of Health (SDOH) Social History: SDOH Screenings   Food Insecurity: No Food Insecurity (02/26/2024)  Housing: Low Risk  (02/26/2024)  Transportation Needs: No Transportation Needs (02/26/2024)  Utilities: Not At Risk (02/26/2024)  Alcohol Screen: Low Risk  (03/09/2020)  Social Connections: Unknown (02/26/2024)  Tobacco Use: Low Risk  (02/26/2024)   SDOH Interventions:     Readmission Risk Interventions    06/13/2022    1:46 PM  Readmission Risk Prevention Plan  Transportation Screening Complete  Medication Review (RN Care Manager) Complete  PCP or Specialist appointment within 3-5 days of discharge Complete  HRI or Home Care Consult Complete  SW Recovery Care/Counseling Consult Not Complete  SW Consult Not Complete Comments RNCM assigned to case  Palliative Care Screening Not Applicable  Skilled Nursing Facility Not Applicable

## 2024-02-29 NOTE — Plan of Care (Signed)

## 2024-03-01 LAB — HEPATITIS B SURFACE ANTIBODY, QUANTITATIVE: Hep B S AB Quant (Post): 56.4 m[IU]/mL

## 2024-03-07 ENCOUNTER — Emergency Department
Admission: EM | Admit: 2024-03-07 | Discharge: 2024-03-07 | Disposition: A | Discharge status: Home / Self Care | Attending: Emergency Medicine | Admitting: Emergency Medicine

## 2024-03-07 ENCOUNTER — Other Ambulatory Visit: Payer: Self-pay

## 2024-03-07 DIAGNOSIS — Z992 Dependence on renal dialysis: Secondary | ICD-10-CM | POA: Insufficient documentation

## 2024-03-07 DIAGNOSIS — N186 End stage renal disease: Secondary | ICD-10-CM | POA: Insufficient documentation

## 2024-03-07 DIAGNOSIS — T82898A Other specified complication of vascular prosthetic devices, implants and grafts, initial encounter: Secondary | ICD-10-CM | POA: Diagnosis present

## 2024-03-07 LAB — CBC WITH DIFFERENTIAL/PLATELET
Abs Immature Granulocytes: 0.01 10*3/uL (ref 0.00–0.07)
Basophils Absolute: 0 10*3/uL (ref 0.0–0.1)
Basophils Relative: 1 %
Eosinophils Absolute: 0.1 10*3/uL (ref 0.0–0.5)
Eosinophils Relative: 2 %
HCT: 35.3 % — ABNORMAL LOW (ref 39.0–52.0)
Hemoglobin: 11.3 g/dL — ABNORMAL LOW (ref 13.0–17.0)
Immature Granulocytes: 0 %
Lymphocytes Relative: 21 %
Lymphs Abs: 1.3 10*3/uL (ref 0.7–4.0)
MCH: 31.3 pg (ref 26.0–34.0)
MCHC: 32 g/dL (ref 30.0–36.0)
MCV: 97.8 fL (ref 80.0–100.0)
Monocytes Absolute: 0.7 10*3/uL (ref 0.1–1.0)
Monocytes Relative: 11 %
Neutro Abs: 4.1 10*3/uL (ref 1.7–7.7)
Neutrophils Relative %: 65 %
Platelets: 140 10*3/uL — ABNORMAL LOW (ref 150–400)
RBC: 3.61 MIL/uL — ABNORMAL LOW (ref 4.22–5.81)
RDW: 15.9 % — ABNORMAL HIGH (ref 11.5–15.5)
WBC: 6.3 10*3/uL (ref 4.0–10.5)
nRBC: 0 % (ref 0.0–0.2)

## 2024-03-07 LAB — BASIC METABOLIC PANEL WITH GFR
Anion gap: 16 — ABNORMAL HIGH (ref 5–15)
BUN: 69 mg/dL — ABNORMAL HIGH (ref 8–23)
CO2: 28 mmol/L (ref 22–32)
Calcium: 10.5 mg/dL — ABNORMAL HIGH (ref 8.9–10.3)
Chloride: 94 mmol/L — ABNORMAL LOW (ref 98–111)
Creatinine, Ser: 12.87 mg/dL — ABNORMAL HIGH (ref 0.61–1.24)
GFR, Estimated: 4 mL/min — ABNORMAL LOW (ref >60)
Glucose, Bld: 128 mg/dL — ABNORMAL HIGH (ref 70–99)
Potassium: 4.1 mmol/L (ref 3.5–5.1)
Sodium: 138 mmol/L (ref 135–145)

## 2024-03-07 MED ORDER — ALTEPLASE 2 MG IJ SOLR
2.0000 mg | Freq: Once | INTRAMUSCULAR | Status: DC
  Filled 2024-03-07: qty 2

## 2024-03-07 NOTE — ED Triage Notes (Addendum)
 Pt comes with c/o dialysis fistula being clogged. Pt went to treatment today but it wouldn't work so they sent him here.   Pt is from [facility]. Call Sula Soda [phone]

## 2024-03-07 NOTE — Consult Note (Incomplete)
 Hospital Consult    Reason for Consult:  Non Functioning AV Fistula Requesting Physician:  *** MRN #:  [id number]  History of Present Illness: This is a 68 y.o. male ***  Past Medical History:  Diagnosis Date   Anemia    Cerebral hemorrhage (HCC) 2012   Chronic constipation    Chronic kidney disease    Diabetes mellitus without complication (HCC)    Hemodialysis patient (HCC)    Hyperlipidemia    Hypertension    Schizophrenia (HCC)    Stroke [place])     Past Surgical History:  Procedure Laterality Date   A/V FISTULAGRAM Left 02/24/2017   Procedure: A/V Fistulagram;  Surgeon: Renford Dills, MD;  Location: [facility];  Service: Cardiovascular;  Laterality: Left;   A/V FISTULAGRAM Left 09/03/2020   Procedure: A/V FISTULAGRAM;  Surgeon: Annice Needy, MD;  Location: [facility];  Service: Cardiovascular;  Laterality: Left;   A/V FISTULAGRAM Left 06/27/2021   Procedure: A/V FISTULAGRAM;  Surgeon: Annice Needy, MD;  Location: [facility];  Service: Cardiovascular;  Laterality: Left;   A/V FISTULAGRAM Left 07/24/2021   Procedure: A/V FISTULAGRAM;  Surgeon: Annice Needy, MD;  Location: [facility];  Service: Cardiovascular;  Laterality: Left;   A/V FISTULAGRAM Left 03/25/2022   Procedure: A/V Fistulagram;  Surgeon: Bertram Denver, MD;  Location: [facility];  Service: Cardiovascular;  Laterality: Left;   A/V FISTULAGRAM N/A 06/12/2022   Procedure: A/V Fistulagram;  Surgeon: Renford Dills, MD;  Location: [facility];  Service: Cardiovascular;  Laterality: N/A;   A/V SHUNT INTERVENTION N/A 02/29/2024   Procedure: A/V SHUNT INTERVENTION;  Surgeon: Annice Needy, MD;  Location: [facility];  Service: Cardiovascular;  Laterality: N/A;   DIALYSIS/PERMA CATHETER INSERTION N/A 12/03/2021   Procedure: DIALYSIS/PERMA CATHETER INSERTION;  Surgeon: Annice Needy, MD;  Location: [facility];  Service: Cardiovascular;   Laterality: N/A;   DIALYSIS/PERMA CATHETER INSERTION N/A 03/27/2022   Procedure: DIALYSIS/PERMA CATHETER INSERTION;  Surgeon: Annice Needy, MD;  Location: [facility];  Service: Cardiovascular;  Laterality: N/A;   DIALYSIS/PERMA CATHETER REMOVAL N/A 03/13/2022   Procedure: DIALYSIS/PERMA CATHETER REMOVAL;  Surgeon: Annice Needy, MD;  Location: [facility];  Service: Cardiovascular;  Laterality: N/A;   INSERTION OF DIALYSIS CATHETER Right 02/27/2024   Procedure: INSERTION OF DIALYSIS CATHETER;  Surgeon: Fransisco Hertz, MD;  Location: [facility];  Service: Vascular;  Laterality: Right;   PERIPHERAL VASCULAR CATHETERIZATION N/A 04/17/2015   Procedure: Dialysis/Perma Catheter Insertion;  Surgeon: Renford Dills, MD;  Location: [facility];  Service: Cardiovascular;  Laterality: N/A;   PERIPHERAL VASCULAR CATHETERIZATION Left 07/30/2015   Procedure: A/V Shuntogram/Fistulagram;  Surgeon: Annice Needy, MD;  Location: [facility];  Service: Cardiovascular;  Laterality: Left;   PERIPHERAL VASCULAR CATHETERIZATION Left 07/30/2015   Procedure: A/V Shunt Intervention;  Surgeon: Annice Needy, MD;  Location: [facility];  Service: Cardiovascular;  Laterality: Left;   PERIPHERAL VASCULAR CATHETERIZATION N/A 08/20/2015   Procedure: DIALYSIS/PERMA CATHETER REMOVAL;  Surgeon: Annice Needy, MD;  Location: [facility];  Service: Cardiovascular;  Laterality: N/A;   PERIPHERAL VASCULAR THROMBECTOMY Left 03/27/2022   Procedure: PERIPHERAL VASCULAR THROMBECTOMY;  Surgeon: Annice Needy, MD;  Location: [facility];  Service: Cardiovascular;  Laterality: Left;   PERIPHERAL VASCULAR THROMBECTOMY Left 08/04/2022   Procedure: PERIPHERAL VASCULAR THROMBECTOMY;  Surgeon: Annice Needy, MD;  Location: [facility];  Service: Cardiovascular;  Laterality: Left;   PERIPHERAL VASCULAR THROMBECTOMY Left 09/01/2023   Procedure: PERIPHERAL VASCULAR THROMBECTOMY;  Surgeon: Annice Needy, MD;  Location: [facility];  Service: Cardiovascular;  Laterality: Left;   REVISON OF ARTERIOVENOUS FISTULA Left 12/04/2021   Procedure: REVISON OF ARTERIOVENOUS FISTULA;  Surgeon: Annice Needy, MD;  Location: [facility];  Service: Vascular;  Laterality: Left;   TEMPORARY DIALYSIS CATHETER N/A 06/11/2022   Procedure: TEMPORARY DIALYSIS CATHETER;  Surgeon: Renford Dills, MD;  Location: [facility];  Service: Cardiovascular;  Laterality: N/A;   THROMBECTOMY W/ EMBOLECTOMY Left 12/04/2021   Procedure: THROMBECTOMY ARTERIOVENOUS FISTULA;  Surgeon: Annice Needy, MD;  Location: [facility];  Service: Vascular;  Laterality: Left;   VASCULAR SURGERY     Fistula placement    Allergies  Allergen Reactions   Nsaids Other (See Comments)    Cannot take due to renal disease    Prior to Admission medications   Medication Sig Start Date End Date Taking? Authorizing Provider  aspirin EC 81 MG tablet Take 81 mg by mouth daily.     [provider]  benztropine (COGENTIN) 0.5 MG tablet Take 0.5 mg by mouth 2 (two) times daily.    [provider]  cetirizine (ZYRTEC) 10 MG tablet Take 10 mg by mouth every other day.    [provider]  Cholecalciferol (VITAMIN D3) 125 MCG (5000 UT) CAPS Take 5,000 Units by mouth once a week.    [provider]  clopidogrel (PLAVIX) 75 MG tablet Take 1 tablet (75 mg total) by mouth daily. 03/01/24   Marrion Coy, MD  diphenhydrAMINE (BENADRYL) 25 mg capsule Take 25 mg by mouth See admin instructions. Take 1 capsule (25mg ) by mouth nightly as needed for sleep - may take 1 capsule (25mg ) by mouth during the daytime as needed for itching    [provider]  DIPHENHYDRAMINE-ZINC OXIDE EX Apply 1 application. topically See admin instructions. Apply small amount of affected area three times daily as needed    [provider]  docusate sodium (COLACE) 100 MG capsule Take 100 mg by mouth 2 (two) times daily.     [provider]  [facility] TEST test strip EasyMax 06/10/22   [provider]  feeding supplement (BOOST HIGH PROTEIN) LIQD Take 1 Container by mouth See admin instructions. Take 1 container by mouth three times a week or as needed of Nutritional Supplement    [provider]  fluticasone (FLONASE) 50 MCG/ACT nasal spray Place 2 sprays into both nostrils daily.    [provider]  haloperidol (HALDOL) 10 MG tablet Take 5-10 mg by mouth See admin instructions. Take 5 mg in the morning and 10 mg at bedtime    [provider]  lidocaine-prilocaine (EMLA) cream Apply 1 application topically 3 (three) times a week. Apply over dialysis shunt 30 minutes prior to dialysis.    [provider]  mirtazapine (REMERON) 15 MG tablet Take 15 mg by mouth at bedtime.    [provider]  Multiple Vitamin (DAILY-VITE) TABS Take 1 tablet by mouth daily. 02/04/24   [provider]  niacin (NIASPAN) 1000 MG CR tablet Take 1,000 mg by mouth at bedtime.     [provider]  polyethylene glycol (MIRALAX / GLYCOLAX) 17 g packet Take 17 g by mouth daily as needed (Constipation). Mix 17 g (1 capful) in to  8 ounces of Juice/Water    [provider]  pravastatin (PRAVACHOL) 40 MG tablet Take 40 mg by mouth at bedtime.     [provider]  sevelamer carbonate (RENVELA) 800 MG tablet Take 2 tablets (1,600 mg total) by mouth 3 (three) times daily with meals. Patient not taking: Reported on 02/28/2024 12/05/21   Azucena Fallen, MD    Social History   Socioeconomic History   Marital status: Single    Spouse name: Not on file   Number of children: Not on file   Years of education: Not on file   Highest education level: Not on file  Occupational History   Not on file  Tobacco Use   Smoking status: Never   Smokeless tobacco: Never  Substance and Sexual Activity   Alcohol use: No   Drug use: No   Sexual activity: Not  Currently    Birth control/protection: None  Other Topics Concern   Not on file  Social History Narrative    Lives at group home: [facility] with 6 other people.   Social Drivers of Corporate investment banker Strain: Not on file  Food Insecurity: No Food Insecurity (02/26/2024)   Hunger Vital Sign    Worried About Running Out of Food in the Last Year: Never true    Ran Out of Food in the Last Year: Never true  Transportation Needs: No Transportation Needs (02/26/2024)   PRAPARE - Administrator, Civil Service (Medical): No    Lack of Transportation (Non-Medical): No  Physical Activity: Not on file  Stress: Not on file  Social Connections: Unknown (02/26/2024)   Social Connection and Isolation Panel [NHANES]    Frequency of Communication with Friends and Family: More than three times a week    Frequency of Social Gatherings with Friends and Family: Twice a week    Attends Religious Services: 1 to 4 times per year    Active Member of [employer] or Organizations: No    Attends Banker Meetings: Never    Marital Status: Patient declined  Catering manager Violence: Not At Risk (02/26/2024)   Humiliation, Afraid, Rape, and Kick questionnaire    Fear of Current or Ex-Partner: No    Emotionally Abused: No    Physically Abused: No    Sexually Abused: No    *** Family History  Problem Relation Age of Onset   Diabetes Mother    Kidney cancer Mother    Diabetes Sister    Stroke Brother    Prostate cancer Neg Hx    Bladder Cancer Neg Hx     ROS: Otherwise negative unless mentioned in HPI  Physical Examination  Vitals:   03/07/24 1359  BP: 130/86  Pulse: 92  Resp: 20  Temp: 97.6 F (36.4 C)  SpO2: 92%   Body mass index is 25.72 kg/m.  General:  WDWN in NAD Gait: Not observed HENT: WNL, normocephalic Pulmonary: normal non-labored breathing, without Rales, rhonchi,  wheezing Cardiac: {Desc; regular/irreg:14544}, without  Murmurs, rubs  or gallops; {With/Without:20273} carotid bruits Abdomen: *** soft, NT/ND, no masses Skin: {With/Without:20273} rashes Vascular Exam/Pulses: *** Extremities: {With/Without:20273} ischemic changes, {With/Without:20273} Gangrene , {With/Without:20273} cellulitis; {With/Without:20273} open wounds;  Musculoskeletal: no muscle wasting or atrophy  Neurologic: A&O X 3;  No focal weakness or paresthesias are detected; speech is fluent/normal Psychiatric:  The pt has {Desc; normal/abnormal:11317::"Normal"} affect. Lymph:  Unremarkable  CBC    Component Value Date/Time   WBC 5.8  02/29/2024 1426   RBC 3.43 (L) 02/29/2024 1426   HGB 10.5 (L) 02/29/2024 1426   HGB 8.0 (L) 03/21/2015 1512   HCT 31.9 (L) 02/29/2024 1426   HCT 24.8 (L) 03/21/2015 1512   PLT 82 (L) 02/29/2024 1426   PLT 93 (L) 03/21/2015 1512   MCV 93.0 02/29/2024 1426   MCV 90 03/21/2015 1512   MCH 30.6 02/29/2024 1426   MCHC 32.9 02/29/2024 1426   RDW 16.3 (H) 02/29/2024 1426   RDW 14.3 03/21/2015 1512   LYMPHSABS 1.6 06/09/2022 2343   LYMPHSABS 1.6 12/15/2014 1508   MONOABS 0.6 06/09/2022 2343   MONOABS 0.3 12/15/2014 1508   EOSABS 0.2 06/09/2022 2343   EOSABS 0.2 12/15/2014 1508   BASOSABS 0.0 06/09/2022 2343   BASOSABS 0.0 12/15/2014 1508    BMET    Component Value Date/Time   NA 135 02/29/2024 0527   NA 140 03/21/2015 1512   K 4.5 02/29/2024 0527   K 4.0 03/21/2015 1512   CL 92 (L) 02/29/2024 0527   CL 95 (L) 03/21/2015 1512   CO2 27 02/29/2024 0527   CO2 36 (H) 03/21/2015 1512   GLUCOSE 95 02/29/2024 0527   GLUCOSE 117 (H) 03/21/2015 1512   BUN 74 (H) 02/29/2024 0527   BUN 64 (H) 03/21/2015 1512   CREATININE 10.73 (H) 02/29/2024 0527   CREATININE 7.71 (H) 03/21/2015 1512   CALCIUM 9.1 02/29/2024 0527   CALCIUM 8.5 (L) 03/21/2015 1512   GFRNONAA 5 (L) 02/29/2024 0527   GFRNONAA 7 (L) 03/21/2015 1512   GFRAA 21 (L) 03/08/2020 1756   GFRAA 8 (L) 03/21/2015 1512    COAGS: Lab Results  Component Value  Date   INR 1.1 02/26/2024   INR 1.1 06/10/2022   INR 1.1 12/13/2014     Non-Invasive Vascular Imaging:   ***  Statin:  {yes no:314532} Beta Blocker:  {yes no:314532} Aspirin:  {yes no:314532} ACEI:  {yes no:314532} ARB:  {yes no:314532} CCB use:  {yes/no:20286} Other antiplatelets/anticoagulants:  {yes no:314532} ***   ASSESSMENT/PLAN: This is a 68 y.o. male ***   -***    R  [facility] 03/07/2024 4:27 PM

## 2024-03-07 NOTE — ED Provider Notes (Signed)
 [facility] Provider Note   Event Date/Time   First MD Initiated Contact with Patient 03/07/24 1538     (approximate) History  Vascular Access Problem  HPI Lawrence [name] is a 68 y.o. male with a stated past medical history of end-stage renal disease on dialysis Monday/Wednesday/Friday presents for a reportedly clogged AV fistula.  Patient has had 2 previous episodes in the last few weeks of similar.  Patient's caregiver at bedside states that they were told to come here by the vascular surgery team. ROS: Patient currently denies any vision changes, tinnitus, difficulty speaking, facial droop, sore throat, chest pain, shortness of breath, abdominal pain, nausea/vomiting/diarrhea, dysuria, or weakness/numbness/paresthesias in any extremity   Physical Exam  Triage Vital Signs: ED Triage Vitals  Encounter Vitals Group     BP 03/07/24 1359 130/86     Systolic BP Percentile --      Diastolic BP Percentile --      Pulse Rate 03/07/24 1359 92     Resp 03/07/24 1359 20     Temp 03/07/24 1359 97.6 F (36.4 C)     Temp Source 03/07/24 1359 Oral     SpO2 03/07/24 1359 92 %     Weight 03/07/24 1408 164 lb 3.9 oz (74.5 kg)     Height 03/07/24 1408 5\' 7"  (1.702 m)     Head Circumference --      Peak Flow --      Pain Score 03/07/24 1406 0     Pain Loc --      Pain Education --      Exclude from Growth Chart --    Most recent vital signs: Vitals:   03/07/24 1812 03/07/24 1932  BP: 128/80 118/76  Pulse: 88 82  Resp: 20 19  Temp: 98 F (36.7 C) 98 F (36.7 C)  SpO2: 94% 95%   General: Awake, oriented x4. CV:  Good peripheral perfusion.  Resp:  Normal effort.  Abd:  No distention.  Other:  Elderly overweight African-American male resting comfortably in no acute distress.  AV fistula to left arm with no palpable thrill ED Results / Procedures / Treatments  Labs (all labs ordered are listed, but only abnormal results are displayed) Labs Reviewed  BASIC  METABOLIC PANEL WITH GFR - Abnormal; Notable for the following components:      Result Value   Chloride 94 (*)    Glucose, Bld 128 (*)    BUN 69 (*)    Creatinine, Ser 12.87 (*)    Calcium 10.5 (*)    GFR, Estimated 4 (*)    Anion gap 16 (*)    All other components within normal limits  CBC WITH DIFFERENTIAL/PLATELET - Abnormal; Notable for the following components:   RBC 3.61 (*)    Hemoglobin 11.3 (*)    HCT 35.3 (*)    RDW 15.9 (*)    Platelets 140 (*)    All other components within normal limits   PROCEDURES: Critical Care performed: No Procedures MEDICATIONS ORDERED IN ED: Medications  alteplase (CATHFLO ACTIVASE) injection 2 mg (has no administration in time range)   IMPRESSION / MDM / ASSESSMENT AND PLAN / ED COURSE  I reviewed the triage vital signs and the nursing notes.                             The patient is on the cardiac monitor to evaluate for evidence of  arrhythmia and/or significant heart rate changes. Patient's presentation is most consistent with acute presentation with potential threat to life or bodily function. Patient is 68 year old male with the above-stated past medical history who presents for malfunction of his AV fistula keeping him from getting dialysis today.  I spoke with Dr. Wyn Quaker and vascular surgery who is planning on clearing AV graft and/or placement of tunneled catheter for continued dialysis.  Patient shows no signs of volume overload or hyperkalemia needing emergent dialysis today.  Patient stable for discharge and follow-up with vascular surgery as soon as possible  Dispo: Discharge home   FINAL CLINICAL IMPRESSION(S) / ED DIAGNOSES   Final diagnoses:  AV fistula occlusion, initial encounter (HCC)   Rx / DC Orders   ED Discharge Orders     None      Note:  This document was prepared using Dragon voice recognition software and may include unintentional dictation errors.   Merwyn Katos, MD 03/07/24 [phone]

## 2024-03-08 ENCOUNTER — Telehealth (INDEPENDENT_AMBULATORY_CARE_PROVIDER_SITE_OTHER): Payer: Self-pay

## 2024-03-08 NOTE — Telephone Encounter (Signed)
 Spoke with the patient's caregiver at his facility, he is scheduled with Dr. Wyn Quaker for a permcath insertion on 03/09/24 at the [facility]. Pre-procedure instructions were discussed and she stated she wrote them down.

## 2024-03-09 ENCOUNTER — Ambulatory Visit
Admission: RE | Admit: 2024-03-09 | Discharge: 2024-03-09 | Disposition: A | Discharge status: Home / Self Care | Attending: Vascular Surgery | Admitting: Vascular Surgery

## 2024-03-09 ENCOUNTER — Other Ambulatory Visit: Payer: Self-pay

## 2024-03-09 ENCOUNTER — Encounter
Admission: RE | Disposition: A | Discharge status: Home / Self Care | Payer: Self-pay | Source: Home / Self Care | Attending: Vascular Surgery

## 2024-03-09 ENCOUNTER — Encounter: Payer: Self-pay | Admitting: Vascular Surgery

## 2024-03-09 DIAGNOSIS — Z992 Dependence on renal dialysis: Secondary | ICD-10-CM | POA: Diagnosis not present

## 2024-03-09 DIAGNOSIS — N186 End stage renal disease: Secondary | ICD-10-CM | POA: Diagnosis present

## 2024-03-09 DIAGNOSIS — T82868A Thrombosis of vascular prosthetic devices, implants and grafts, initial encounter: Secondary | ICD-10-CM

## 2024-03-09 HISTORY — PX: DIALYSIS/PERMA CATHETER INSERTION: CATH118288

## 2024-03-09 LAB — GLUCOSE, CAPILLARY
Glucose-Capillary: 114 mg/dL — ABNORMAL HIGH (ref 70–99)
Glucose-Capillary: 104 mg/dL — ABNORMAL HIGH (ref 70–99)

## 2024-03-09 LAB — POTASSIUM (ARMC VASCULAR LAB ONLY): Potassium (ARMC vascular lab): 4.2 mmol/L (ref 3.5–5.1)

## 2024-03-09 SURGERY — DIALYSIS/PERMA CATHETER INSERTION
Anesthesia: Moderate Sedation

## 2024-03-09 MED ORDER — MIDAZOLAM HCL 2 MG/2ML IJ SOLN
INTRAMUSCULAR | Status: DC | PRN
  Administered 2024-03-09: 1 mg via INTRAVENOUS
  Administered 2024-03-09: 2 mg via INTRAVENOUS

## 2024-03-09 MED ORDER — FENTANYL CITRATE (PF) 100 MCG/2ML IJ SOLN
INTRAMUSCULAR | Status: DC | PRN
  Administered 2024-03-09: 50 ug via INTRAVENOUS
  Administered 2024-03-09: 25 ug via INTRAVENOUS

## 2024-03-09 MED ORDER — HEPARIN SODIUM (PORCINE) 10000 UNIT/ML IJ SOLN
INTRAMUSCULAR | Status: DC | PRN
  Administered 2024-03-09: 10000 [IU]

## 2024-03-09 MED ORDER — METOPROLOL TARTRATE 5 MG/5ML IV SOLN
INTRAVENOUS | Status: AC
  Filled 2024-03-09: qty 5

## 2024-03-09 MED ORDER — METOPROLOL TARTRATE 5 MG/5ML IV SOLN
5.0000 mg | Freq: Once | INTRAVENOUS | Status: AC
  Administered 2024-03-09: 5 mg via INTRAVENOUS

## 2024-03-09 MED ORDER — CEFAZOLIN SODIUM-DEXTROSE 1-4 GM/50ML-% IV SOLN
INTRAVENOUS | Status: AC
  Filled 2024-03-09: qty 50

## 2024-03-09 MED ORDER — MIDAZOLAM HCL 2 MG/2ML IJ SOLN
INTRAMUSCULAR | Status: AC
  Filled 2024-03-09: qty 2

## 2024-03-09 MED ORDER — MIDAZOLAM HCL 2 MG/ML PO SYRP: 8.0000 mg | ORAL_SOLUTION | Freq: Once | ORAL | Status: DC | PRN

## 2024-03-09 MED ORDER — LIDOCAINE-EPINEPHRINE (PF) 1 %-1:200000 IJ SOLN
INTRAMUSCULAR | Status: DC | PRN
  Administered 2024-03-09: 20 mL

## 2024-03-09 MED ORDER — SODIUM CHLORIDE 0.9 % IV SOLN: INTRAVENOUS | Status: DC

## 2024-03-09 MED ORDER — HYDROMORPHONE HCL 1 MG/ML IJ SOLN: 1.0000 mg | Freq: Once | INTRAMUSCULAR | Status: DC | PRN

## 2024-03-09 MED ORDER — DIPHENHYDRAMINE HCL 50 MG/ML IJ SOLN: 50.0000 mg | Freq: Once | INTRAMUSCULAR | Status: DC | PRN

## 2024-03-09 MED ORDER — HEPARIN (PORCINE) IN NACL 1000-0.9 UT/500ML-% IV SOLN
INTRAVENOUS | Status: DC | PRN
  Administered 2024-03-09: 500 mL

## 2024-03-09 MED ORDER — FAMOTIDINE 20 MG PO TABS: 40.0000 mg | ORAL_TABLET | Freq: Once | ORAL | Status: DC | PRN

## 2024-03-09 MED ORDER — FENTANYL CITRATE PF 50 MCG/ML IJ SOSY
PREFILLED_SYRINGE | INTRAMUSCULAR | Status: AC
  Filled 2024-03-09: qty 1

## 2024-03-09 MED ORDER — ONDANSETRON HCL 4 MG/2ML IJ SOLN: 4.0000 mg | Freq: Four times a day (QID) | INTRAMUSCULAR | Status: DC | PRN

## 2024-03-09 MED ORDER — CEFAZOLIN SODIUM-DEXTROSE 1-4 GM/50ML-% IV SOLN
1.0000 g | INTRAVENOUS | Status: AC
  Administered 2024-03-09: 1 g via INTRAVENOUS

## 2024-03-09 MED ORDER — METHYLPREDNISOLONE SODIUM SUCC 125 MG IJ SOLR: 125.0000 mg | Freq: Once | INTRAMUSCULAR | Status: DC | PRN

## 2024-03-09 MED ORDER — FENTANYL CITRATE PF 50 MCG/ML IJ SOSY
PREFILLED_SYRINGE | INTRAMUSCULAR | Status: AC
Start: 2024-03-09 — End: ?
  Filled 2024-03-09: qty 1

## 2024-03-09 SURGICAL SUPPLY — 7 items
BIOPATCH RED 1 DISK 7.0 (GAUZE/BANDAGES/DRESSINGS) IMPLANT
CATH CANNON HEMO 15FR 19 (HEMODIALYSIS SUPPLIES) IMPLANT
COVER PROBE ULTRASOUND 5X96 (MISCELLANEOUS) IMPLANT
DERMABOND ADVANCED .7 DNX12 (GAUZE/BANDAGES/DRESSINGS) IMPLANT
PACK ANGIOGRAPHY (CUSTOM PROCEDURE TRAY) IMPLANT
SUT MNCRL AB 4-0 PS2 18 (SUTURE) IMPLANT
SUT PROLENE 0 CT 1 30 (SUTURE) IMPLANT

## 2024-03-09 NOTE — Op Note (Signed)
 OPERATIVE NOTE    PRE-OPERATIVE DIAGNOSIS: 1. ESRD 2. Nonfunctional arm access  POST-OPERATIVE DIAGNOSIS: same as above  PROCEDURE: Ultrasound guidance for vascular access to the right internal jugular vein Fluoroscopic guidance for placement of catheter Placement of a 1 cm tip to cuff tunneled hemodialysis catheter via the right internal jugular vein  SURGEON: Festus Barren, MD  ANESTHESIA:  Local with Moderate conscious sedation for approximately 24 minutes using 3 mg of Versed and 75 mcg of Fentanyl  ESTIMATED BLOOD LOSS: 5 cc  FLUORO TIME: less than one minute  CONTRAST: none  FINDING(S): 1.  Patent right internal jugular vein  SPECIMEN(S):  None  INDICATIONS:   Lawrence [name] is a 68 y.o.male who presents with renal failure and an arm access that has clotted several times after intervention.  The patient needs long term dialysis access for their ESRD, and a Permcath is necessary.  Risks and benefits are discussed and informed consent is obtained.    DESCRIPTION: After obtaining full informed written consent, the patient was brought back to the vascular suited. The patient's right neck and chest were sterilely prepped and draped in a sterile surgical field was created. Moderate conscious sedation was administered during a face to face encounter with the patient throughout the procedure with my supervision of the RN administering medicines and monitoring the patient's vital signs, pulse oximetry, telemetry and mental status throughout from the start of the procedure until the patient was taken to the recovery room.  The right internal jugular vein was visualized with ultrasound and found to be patent. It was then accessed under direct ultrasound guidance and a permanent image was recorded. A wire was placed. After skin nick and dilatation, the peel-away sheath was placed over the wire. I then turned my attention to an area under the clavicle. Approximately 1-2 fingerbreadths below  the clavicle a small counterincision was created and tunneled from the subclavicular incision to the access site. Using fluoroscopic guidance, a 19 centimeter tip to cuff tunneled hemodialysis catheter was selected, and tunneled from the subclavicular incision to the access site. It was then placed through the peel-away sheath and the peel-away sheath was removed. Using fluoroscopic guidance the catheter tips were parked in the right atrium. The appropriate distal connectors were placed. It withdrew blood well and flushed easily with heparinized saline and a concentrated heparin solution was then placed. It was secured to the chest wall with 2 Prolene sutures. The access incision was closed single 4-0 Monocryl. A 4-0 Monocryl pursestring suture was placed around the exit site. Sterile dressings were placed. The patient tolerated the procedure well and was taken to the recovery room in stable condition.  COMPLICATIONS: None  CONDITION: Stable  Festus Barren, MD 03/09/2024 2:50 PM   This note was created with Dragon Medical transcription system. Any errors in dictation are purely unintentional.

## 2024-03-09 NOTE — Interval H&P Note (Signed)
 History and Physical Interval Note:  03/09/2024 1:40 PM  Lawrence [name]  has presented today for surgery, with the diagnosis of Perma Cath Insertion   ESR.  The various methods of treatment have been discussed with the patient and family. After consideration of risks, benefits and other options for treatment, the patient has consented to  Procedure(s): DIALYSIS/PERMA CATHETER INSERTION (N/A) as a surgical intervention.  The patient's history has been reviewed, patient examined, no change in status, stable for surgery.  I have reviewed the patient's chart and labs.  Questions were answered to the patient's satisfaction.     Festus Barren

## 2024-03-10 ENCOUNTER — Encounter: Payer: Self-pay | Admitting: Vascular Surgery

## 2024-03-21 ENCOUNTER — Other Ambulatory Visit (INDEPENDENT_AMBULATORY_CARE_PROVIDER_SITE_OTHER): Payer: Self-pay | Admitting: Vascular Surgery

## 2024-03-21 DIAGNOSIS — N186 End stage renal disease: Secondary | ICD-10-CM

## 2024-03-24 ENCOUNTER — Ambulatory Visit (INDEPENDENT_AMBULATORY_CARE_PROVIDER_SITE_OTHER): Admitting: Nurse Practitioner

## 2024-03-24 ENCOUNTER — Encounter (INDEPENDENT_AMBULATORY_CARE_PROVIDER_SITE_OTHER): Payer: Self-pay | Admitting: Nurse Practitioner

## 2024-03-24 ENCOUNTER — Ambulatory Visit (INDEPENDENT_AMBULATORY_CARE_PROVIDER_SITE_OTHER)

## 2024-03-24 VITALS — BP 122/81 | HR 91 | Resp 16 | Wt 164.0 lb

## 2024-03-24 DIAGNOSIS — E1169 Type 2 diabetes mellitus with other specified complication: Secondary | ICD-10-CM

## 2024-03-24 DIAGNOSIS — N186 End stage renal disease: Secondary | ICD-10-CM | POA: Diagnosis not present

## 2024-03-24 DIAGNOSIS — Z992 Dependence on renal dialysis: Secondary | ICD-10-CM

## 2024-03-24 DIAGNOSIS — E785 Hyperlipidemia, unspecified: Secondary | ICD-10-CM

## 2024-03-24 DIAGNOSIS — I10 Essential (primary) hypertension: Secondary | ICD-10-CM | POA: Diagnosis not present

## 2024-03-27 ENCOUNTER — Encounter (INDEPENDENT_AMBULATORY_CARE_PROVIDER_SITE_OTHER): Payer: Self-pay | Admitting: Nurse Practitioner

## 2024-03-27 NOTE — Progress Notes (Signed)
 Subjective:    Patient ID: Lawrence [name], male    DOB: [birth year], 68 y.o.   MRN: [id number] Chief Complaint  Patient presents with   Follow-up    [facility] 2-3 week with vein mapping    The patient returns to the office for follow up regarding a problem with their dialysis access.  He has a left graft revision of the brachiocephalic AV fistula which has continued to have multiple issues despite revascularization.  He is currently maintained via PermCath.  His original left brachiocephalic AV fistula was placed in 2016 with a jump graft revision in 2022 goal   The patient denies redness or swelling at the access site. The patient denies fever or chills at home or while on dialysis.  No recent shortening of the patient's walking distance or new symptoms consistent with claudication.  No history of rest pain symptoms. No new ulcers or wounds of the lower extremities have occurred.  The patient denies amaurosis fugax or recent TIA symptoms. There are no recent neurological changes noted. There is no history of DVT, PE or superficial thrombophlebitis. No recent episodes of angina or shortness of breath documented.   Today noninvasive studies show adequate access for a right brachiocephalic AV fistula,    Review of Systems  All other systems reviewed and are negative.      Objective:   Physical Exam Vitals reviewed.  HENT:     Head: Normocephalic.  Cardiovascular:     Rate and Rhythm: Normal rate.     Pulses: Normal pulses.  Pulmonary:     Effort: Pulmonary effort is normal.  Skin:    General: Skin is warm and dry.  Neurological:     Mental Status: He is alert and oriented to person, place, and time.  Psychiatric:        Mood and Affect: Mood normal.        Behavior: Behavior normal.        Thought Content: Thought content normal.        Judgment: Judgment normal.     BP 122/81   Pulse 91   Resp 16   Wt 164 lb (74.4 kg)   BMI 25.69 kg/m   Past Medical History:   Diagnosis Date   Anemia    Cerebral hemorrhage (HCC) 2012   Chronic constipation    Chronic kidney disease    Diabetes mellitus without complication (HCC)    Hemodialysis patient (HCC)    Hyperlipidemia    Hypertension    Schizophrenia (HCC)    Stroke [facility])     Social History   Socioeconomic History   Marital status: Single    Spouse name: Not on file   Number of children: Not on file   Years of education: Not on file   Highest education level: Not on file  Occupational History   Not on file  Tobacco Use   Smoking status: Never   Smokeless tobacco: Never  Substance and Sexual Activity   Alcohol use: No   Drug use: No   Sexual activity: Not Currently    Birth control/protection: None  Other Topics Concern   Not on file  Social History Narrative    Lives at group home: [facility] with 6 other people.   Social Drivers of Corporate investment banker Strain: Not on file  Food Insecurity: No Food Insecurity (02/26/2024)   Hunger Vital Sign    Worried About Running Out of Food in the Last  Year: Never true    Ran Out of Food in the Last Year: Never true  Transportation Needs: No Transportation Needs (02/26/2024)   PRAPARE - Administrator, Civil Service (Medical): No    Lack of Transportation (Non-Medical): No  Physical Activity: Not on file  Stress: Not on file  Social Connections: Unknown (02/26/2024)   Social Connection and Isolation Panel [NHANES]    Frequency of Communication with Friends and Family: More than three times a week    Frequency of Social Gatherings with Friends and Family: Twice a week    Attends Religious Services: 1 to 4 times per year    Active Member of [employer] or Organizations: No    Attends Banker Meetings: Never    Marital Status: Patient declined  Catering manager Violence: Not At Risk (02/26/2024)   Humiliation, Afraid, Rape, and Kick questionnaire    Fear of Current or Ex-Partner: No    Emotionally  Abused: No    Physically Abused: No    Sexually Abused: No    Past Surgical History:  Procedure Laterality Date   A/V FISTULAGRAM Left 02/24/2017   Procedure: A/V Fistulagram;  Surgeon: Jackquelyn Mass, MD;  Location: [facility];  Service: Cardiovascular;  Laterality: Left;   A/V FISTULAGRAM Left 09/03/2020   Procedure: A/V FISTULAGRAM;  Surgeon: Celso College, MD;  Location: [facility];  Service: Cardiovascular;  Laterality: Left;   A/V FISTULAGRAM Left 06/27/2021   Procedure: A/V FISTULAGRAM;  Surgeon: Celso College, MD;  Location: [facility];  Service: Cardiovascular;  Laterality: Left;   A/V FISTULAGRAM Left 07/24/2021   Procedure: A/V FISTULAGRAM;  Surgeon: Celso College, MD;  Location: [facility];  Service: Cardiovascular;  Laterality: Left;   A/V FISTULAGRAM Left 03/25/2022   Procedure: A/V Fistulagram;  Surgeon: Lesta Rater, MD;  Location: [facility];  Service: Cardiovascular;  Laterality: Left;   A/V FISTULAGRAM N/A 06/12/2022   Procedure: A/V Fistulagram;  Surgeon: Jackquelyn Mass, MD;  Location: [facility];  Service: Cardiovascular;  Laterality: N/A;   A/V SHUNT INTERVENTION N/A 02/29/2024   Procedure: A/V SHUNT INTERVENTION;  Surgeon: Celso College, MD;  Location: [facility];  Service: Cardiovascular;  Laterality: N/A;   DIALYSIS/PERMA CATHETER INSERTION N/A 12/03/2021   Procedure: DIALYSIS/PERMA CATHETER INSERTION;  Surgeon: Celso College, MD;  Location: [facility];  Service: Cardiovascular;  Laterality: N/A;   DIALYSIS/PERMA CATHETER INSERTION N/A 03/27/2022   Procedure: DIALYSIS/PERMA CATHETER INSERTION;  Surgeon: Celso College, MD;  Location: [facility];  Service: Cardiovascular;  Laterality: N/A;   DIALYSIS/PERMA CATHETER INSERTION N/A 03/09/2024   Procedure: DIALYSIS/PERMA CATHETER INSERTION;  Surgeon: Celso College, MD;  Location: [facility];  Service: Cardiovascular;  Laterality:  N/A;   DIALYSIS/PERMA CATHETER REMOVAL N/A 03/13/2022   Procedure: DIALYSIS/PERMA CATHETER REMOVAL;  Surgeon: Celso College, MD;  Location: [facility];  Service: Cardiovascular;  Laterality: N/A;   INSERTION OF DIALYSIS CATHETER Right 02/27/2024   Procedure: INSERTION OF DIALYSIS CATHETER;  Surgeon: Arvil Lauber, MD;  Location: [facility];  Service: Vascular;  Laterality: Right;   PERIPHERAL VASCULAR CATHETERIZATION N/A 04/17/2015   Procedure: Dialysis/Perma Catheter Insertion;  Surgeon: Jackquelyn Mass, MD;  Location: [facility];  Service: Cardiovascular;  Laterality: N/A;   PERIPHERAL VASCULAR CATHETERIZATION Left 07/30/2015   Procedure: A/V Shuntogram/Fistulagram;  Surgeon: Celso College, MD;  Location: [facility];  Service: Cardiovascular;  Laterality: Left;   PERIPHERAL VASCULAR CATHETERIZATION Left 07/30/2015   Procedure: A/V Shunt Intervention;  Surgeon: Celso College, MD;  Location: [facility];  Service: Cardiovascular;  Laterality: Left;   PERIPHERAL VASCULAR CATHETERIZATION N/A 08/20/2015   Procedure: DIALYSIS/PERMA CATHETER REMOVAL;  Surgeon: Celso College, MD;  Location: [facility];  Service: Cardiovascular;  Laterality: N/A;   PERIPHERAL VASCULAR THROMBECTOMY Left 03/27/2022   Procedure: PERIPHERAL VASCULAR THROMBECTOMY;  Surgeon: Celso College, MD;  Location: [facility];  Service: Cardiovascular;  Laterality: Left;   PERIPHERAL VASCULAR THROMBECTOMY Left 08/04/2022   Procedure: PERIPHERAL VASCULAR THROMBECTOMY;  Surgeon: Celso College, MD;  Location: [facility];  Service: Cardiovascular;  Laterality: Left;   PERIPHERAL VASCULAR THROMBECTOMY Left 09/01/2023   Procedure: PERIPHERAL VASCULAR THROMBECTOMY;  Surgeon: Celso College, MD;  Location: [facility];  Service: Cardiovascular;  Laterality: Left;   REVISON OF ARTERIOVENOUS FISTULA Left 12/04/2021   Procedure: REVISON OF ARTERIOVENOUS FISTULA;  Surgeon: Celso College, MD;   Location: [facility];  Service: Vascular;  Laterality: Left;   TEMPORARY DIALYSIS CATHETER N/A 06/11/2022   Procedure: TEMPORARY DIALYSIS CATHETER;  Surgeon: Jackquelyn Mass, MD;  Location: [facility];  Service: Cardiovascular;  Laterality: N/A;   THROMBECTOMY W/ EMBOLECTOMY Left 12/04/2021   Procedure: THROMBECTOMY ARTERIOVENOUS FISTULA;  Surgeon: Celso College, MD;  Location: [facility];  Service: Vascular;  Laterality: Left;   VASCULAR SURGERY     Fistula placement    Family History  Problem Relation Age of Onset   Diabetes Mother    Kidney cancer Mother    Diabetes Sister    Stroke Brother    Prostate cancer Neg Hx    Bladder Cancer Neg Hx     Allergies  Allergen Reactions   Nsaids Other (See Comments)    Cannot take due to renal disease       Latest Ref Rng & Units 03/07/2024    5:12 PM 02/29/2024    2:26 PM 02/27/2024    4:35 PM  CBC  WBC 4.0 - 10.5 K/uL 6.3  5.8  4.6   Hemoglobin 13.0 - 17.0 g/dL 16.1  09.6  04.5   Hematocrit 39.0 - 52.0 % 35.3  31.9  33.5   Platelets 150 - 400 K/uL 140  82  94       CMP     Component Value Date/Time   NA 138 03/07/2024 1712   NA 140 03/21/2015 1512   K 4.1 03/07/2024 1712   K 4.0 03/21/2015 1512   CL 94 (L) 03/07/2024 1712   CL 95 (L) 03/21/2015 1512   CO2 28 03/07/2024 1712   CO2 36 (H) 03/21/2015 1512   GLUCOSE 128 (H) 03/07/2024 1712   GLUCOSE 117 (H) 03/21/2015 1512   BUN 69 (H) 03/07/2024 1712   BUN 64 (H) 03/21/2015 1512   CREATININE 12.87 (H) 03/07/2024 1712   CREATININE 7.71 (H) 03/21/2015 1512   CALCIUM  10.5 (H) 03/07/2024 1712   CALCIUM  8.5 (L) 03/21/2015 1512   PROT 6.8 08/01/2022 2129   PROT 5.8 (L) 12/13/2014 1354   ALBUMIN 3.2 (L) 02/27/2024 1635   ALBUMIN 2.5 (L) 12/13/2014 1354   AST 16 08/01/2022 2129   AST 17 12/13/2014 1354   ALT 14 08/01/2022 2129   ALT 21 12/13/2014 1354   ALKPHOS 149 (H) 08/01/2022 2129   ALKPHOS 50 12/13/2014 1354  BILITOT 0.8 08/01/2022 2129   BILITOT 0.2  12/13/2014 1354   GFRNONAA 4 (L) 03/07/2024 1712   GFRNONAA 7 (L) 03/21/2015 1512     No results found.     Assessment & Plan:   1. ESRD on dialysis [facility]) (Primary) Recommend:  At this time the patient does not have appropriate extremity access for dialysis  Patient should have a right brachial cephalic AV fistla created.  Left brachiocephalic AV fistula should be ligated  The risks, benefits and alternative therapies were reviewed in detail with the patient.  All questions were answered.  The patient agrees to proceed with surgery.   The patient will follow up with me in the office after the surgery.  2. Type 2 diabetes mellitus with hyperlipidemia (HCC) Continue hypoglycemic medications as already ordered, these medications have been reviewed and there are no changes at this time.  Hgb A1C to be monitored as already arranged by primary service  3. Essential hypertension Continue antihypertensive medications as already ordered, these medications have been reviewed and there are no changes at this time.   Current Outpatient Medications on File Prior to Visit  Medication Sig Dispense Refill   aspirin  EC 81 MG tablet Take 81 mg by mouth daily.      benztropine  (COGENTIN ) 0.5 MG tablet Take 0.5 mg by mouth 2 (two) times daily.     cetirizine (ZYRTEC) 10 MG tablet Take 10 mg by mouth every other day.     Cholecalciferol  (VITAMIN D3) 125 MCG (5000 UT) CAPS Take 5,000 Units by mouth once a week.     clopidogrel  (PLAVIX ) 75 MG tablet Take 1 tablet (75 mg total) by mouth daily. 30 tablet 0   co-enzyme Q-10 30 MG capsule Take 100 mg by mouth 3 (three) times daily.     diphenhydrAMINE  (BENADRYL ) 25 mg capsule Take 25 mg by mouth See admin instructions. Take 1 capsule (25mg ) by mouth nightly as needed for sleep - may take 1 capsule (25mg ) by mouth during the daytime as needed for itching     DIPHENHYDRAMINE -ZINC  OXIDE EX Apply 1 application. topically See admin instructions. Apply small  amount of affected area three times daily as needed     docusate sodium  (COLACE) 100 MG capsule Take 100 mg by mouth 2 (two) times daily.     EASYMAX TEST test strip EasyMax     feeding supplement (BOOST HIGH PROTEIN) LIQD Take 1 Container by mouth See admin instructions. Take 1 container by mouth three times a week or as needed of Nutritional Supplement     fluticasone  (FLONASE ) 50 MCG/ACT nasal spray Place 2 sprays into both nostrils daily.     haloperidol  (HALDOL ) 10 MG tablet Take 5-10 mg by mouth See admin instructions. Take 5 mg in the morning and 10 mg at bedtime     lidocaine -prilocaine  (EMLA ) cream Apply 1 application topically 3 (three) times a week. Apply over dialysis shunt 30 minutes prior to dialysis.     mirtazapine  (REMERON ) 15 MG tablet Take 15 mg by mouth at bedtime.     Multiple Vitamin (DAILY-VITE) TABS Take 1 tablet by mouth daily.     niacin  (NIASPAN ) 1000 MG CR tablet Take 1,000 mg by mouth at bedtime.      patiromer  (VELTASSA ) 8.4 g packet Take 8.4 g by mouth daily.     polyethylene glycol (MIRALAX  / GLYCOLAX ) 17 g packet Take 17 g by mouth daily as needed (Constipation). Mix 17 g (1 capful) in to 8 ounces  of Juice/Water     pravastatin  (PRAVACHOL ) 40 MG tablet Take 40 mg by mouth at bedtime.      sevelamer  carbonate (RENVELA ) 800 MG tablet Take 2 tablets (1,600 mg total) by mouth 3 (three) times daily with meals. (Patient not taking: Reported on 02/28/2024) 180 tablet 0   No current facility-administered medications on file prior to visit.    There are no Patient Instructions on file for this visit. No follow-ups on file.   Duanna Runk E Hoa Deriso, NP

## 2024-03-30 ENCOUNTER — Telehealth (INDEPENDENT_AMBULATORY_CARE_PROVIDER_SITE_OTHER): Payer: Self-pay

## 2024-03-30 NOTE — Telephone Encounter (Signed)
 Spoke with the patients caregiver and he is scheduled with Dr. Prescilla Brod for a right brachialcephalic AVF and ligation of left brachialcephalic AVF on 04/29/24 at [facility]. Pre-op  will call patient to schedule a pre-op  appt at the [facility]. Pre-surgical instructions were discussed and will be mailed.

## 2024-04-05 ENCOUNTER — Other Ambulatory Visit

## 2024-04-07 ENCOUNTER — Other Ambulatory Visit

## 2024-04-07 DIAGNOSIS — R972 Elevated prostate specific antigen [PSA]: Secondary | ICD-10-CM

## 2024-04-08 LAB — PSA: Prostate Specific Ag, Serum: 7 ng/mL — ABNORMAL HIGH (ref 0.0–4.0)

## 2024-04-18 ENCOUNTER — Encounter
Admission: RE | Admit: 2024-04-18 | Discharge: 2024-04-18 | Disposition: A | Discharge status: Home / Self Care | Source: Ambulatory Visit | Attending: Vascular Surgery | Admitting: Vascular Surgery

## 2024-04-18 ENCOUNTER — Other Ambulatory Visit: Payer: Self-pay

## 2024-04-18 ENCOUNTER — Other Ambulatory Visit (INDEPENDENT_AMBULATORY_CARE_PROVIDER_SITE_OTHER): Payer: Self-pay | Admitting: Nurse Practitioner

## 2024-04-18 VITALS — BP 108/74 | HR 93 | Temp 97.3°F | Resp 18 | Ht 67.0 in | Wt 167.0 lb

## 2024-04-18 DIAGNOSIS — Z01812 Encounter for preprocedural laboratory examination: Secondary | ICD-10-CM

## 2024-04-18 DIAGNOSIS — N186 End stage renal disease: Secondary | ICD-10-CM

## 2024-04-18 DIAGNOSIS — E1122 Type 2 diabetes mellitus with diabetic chronic kidney disease: Secondary | ICD-10-CM

## 2024-04-18 DIAGNOSIS — Z01818 Encounter for other preprocedural examination: Secondary | ICD-10-CM | POA: Diagnosis present

## 2024-04-18 LAB — CBC WITH DIFFERENTIAL/PLATELET
Abs Immature Granulocytes: 0.01 10*3/uL (ref 0.00–0.07)
Basophils Absolute: 0 10*3/uL (ref 0.0–0.1)
Basophils Relative: 0 %
Eosinophils Absolute: 0.1 10*3/uL (ref 0.0–0.5)
Eosinophils Relative: 3 %
HCT: 30.6 % — ABNORMAL LOW (ref 39.0–52.0)
Hemoglobin: 9.9 g/dL — ABNORMAL LOW (ref 13.0–17.0)
Immature Granulocytes: 0 %
Lymphocytes Relative: 19 %
Lymphs Abs: 0.9 10*3/uL (ref 0.7–4.0)
MCH: 32.2 pg (ref 26.0–34.0)
MCHC: 32.4 g/dL (ref 30.0–36.0)
MCV: 99.7 fL (ref 80.0–100.0)
Monocytes Absolute: 0.4 10*3/uL (ref 0.1–1.0)
Monocytes Relative: 8 %
Neutro Abs: 3.4 10*3/uL (ref 1.7–7.7)
Neutrophils Relative %: 70 %
Platelets: 88 10*3/uL — ABNORMAL LOW (ref 150–400)
RBC: 3.07 MIL/uL — ABNORMAL LOW (ref 4.22–5.81)
RDW: 19.2 % — ABNORMAL HIGH (ref 11.5–15.5)
Smear Review: NORMAL
WBC: 4.8 10*3/uL (ref 4.0–10.5)
nRBC: 0 % (ref 0.0–0.2)

## 2024-04-18 LAB — BASIC METABOLIC PANEL WITH GFR
Anion gap: 15 (ref 5–15)
BUN: 59 mg/dL — ABNORMAL HIGH (ref 8–23)
CO2: 26 mmol/L (ref 22–32)
Calcium: 9 mg/dL (ref 8.9–10.3)
Chloride: 92 mmol/L — ABNORMAL LOW (ref 98–111)
Creatinine, Ser: 11.24 mg/dL — ABNORMAL HIGH (ref 0.61–1.24)
GFR, Estimated: 5 mL/min — ABNORMAL LOW (ref >60)
Glucose, Bld: 145 mg/dL — ABNORMAL HIGH (ref 70–99)
Potassium: 3.1 mmol/L — ABNORMAL LOW (ref 3.5–5.1)
Sodium: 133 mmol/L — ABNORMAL LOW (ref 135–145)

## 2024-04-18 LAB — TYPE AND SCREEN
ABO/RH(D): A POS
Antibody Screen: NEGATIVE

## 2024-04-18 LAB — SURGICAL PCR SCREEN
MRSA, PCR: NEGATIVE
Staphylococcus aureus: NEGATIVE

## 2024-04-18 NOTE — Pre-Procedure Instructions (Signed)
 Patient accompanied by Albin Altes. Caregiver at [facility]

## 2024-04-18 NOTE — Progress Notes (Signed)
  [facility] Perioperative Services: Pre-Admission/Anesthesia Testing  Abnormal Lab Notification and Treatment Plan of Care   Date: 04/18/24  Name: Lawrence [name] MRN:   [id number]  Re: Abnormal labs noted during PAT appointment   Notified:  Provider Name Provider Role Notification Mode  Sharla Davis, NP-C Vascular Surgery (APP) Routed and/or faxed via [facility]   Clinical Information and Notes:  ABNORMAL LAB VALUE(S): Lab Results  Component Value Date   K 3.1 (L) 04/18/2024   Lab Results  Component Value Date   PLT 88 (L) 04/18/2024   Lawrence [name] is scheduled for an elective ARTERIOVENOUS (AV) FISTULA CREATION (Right); LIGATION OF ARTERIOVENOUS  FISTULA (Left) on 04/29/2024.   Please note, in efforts to promote a safe and effective anesthetic course, per current guidelines/standards set by the [facility] anesthesia team, the minimal acceptable K+ level for the patient to proceed with general anesthesia is 3.0 mmol/L. With that being said, if the patient drops any lower, his elective procedure will need to be postponed until K+ is better optimized. Patient also has chronic thrombocytopenia, which is demonstrated again today by his platelet count of 88 K/uL.   In efforts to prevent case cancellation, and ultimately to promote the safety of this patient undergoing sedation/anesthesia, will send results to surgeon's office for review. Patient is on hemodialysis at this time. Will defer treatment decisions to office as they are in contact with the dialysis centers and can confer with them regarding needed changes in therapy to help better optimize patient for surgery.    Renate Caroline, MSN, APRN, FNP-C, CEN [facility]  Perioperative Services Nurse Practitioner Phone: [phone] 04/18/24 2:37 PM  NOTE: This note has been prepared using Dragon dictation software. Despite my best ability to proofread, there is always the potential that unintentional  transcriptional errors may still occur from this process.

## 2024-04-18 NOTE — Patient Instructions (Addendum)
 Your procedure is scheduled on: FRIDAY MAY 23  Report to the Registration Desk on the 1st floor of the [facility]. To find out your arrival time, please call [phone] between 1PM - 3PM on:   THURSDAY   MAY 22 If your arrival time is 6:00 am, do not arrive before that time as the [facility] entrance doors do not open until 6:00 am.  REMEMBER: Instructions that are not followed completely may result in serious medical risk, up to and including death; or upon the discretion of your surgeon and anesthesiologist your surgery may need to be rescheduled.  Do not eat food after midnight the night before surgery.  No gum chewing or hard candies.   One week prior to surgery:  Friday 16  Stop Anti-inflammatories (NSAIDS) such as Advil, Aleve, Ibuprofen, Motrin, Naproxen, Naprosyn and Aspirin  based products such as Excedrin, Goody's Powder, BC Powder. Stop ANY OVER THE COUNTER supplements until after surgery. cetirizine (ZYRTEC)  Cholecalciferol  (VITAMIN D3)  diphenhydrAMINE  (BENADRYL )  Multiple Vitamin (DAILY-VITE)   You may however, continue to take Tylenol  if needed for pain up until the day of surgery.  **Follow recommendations regarding stopping blood thinners.** aspirin  EC 81  hold 5 days last dose, Saturday May 17  clopidogrel  (PLAVIX ) hold 5 days last dose, Saturday May 17   Continue taking all of your other prescription medications up until the day of surgery.  ON THE DAY OF SURGERY ONLY TAKE THESE MEDICATIONS WITH SIPS OF WATER:  benztropine  (COGENTIN )   No Alcohol for 24 hours before or after surgery.  No Smoking including e-cigarettes for 24 hours before surgery.  No chewable tobacco products for at least 6 hours before surgery.  No nicotine patches on the day of surgery.  Do not use any "recreational" drugs for at least a week (preferably 2 weeks) before your surgery.  Please be advised that the combination of cocaine and anesthesia may have negative outcomes, up to  and including death. If you test positive for cocaine, your surgery will be cancelled.  On the morning of surgery brush your teeth with toothpaste and water, you may rinse your mouth with mouthwash if you wish. Do not swallow any toothpaste or mouthwash.  Use CHG Soap as directed on instruction sheet.  Do not wear jewelry, make-up, hairpins, clips or nail polish.  For welded (permanent) jewelry: bracelets, anklets, waist bands, etc.  Please have this removed prior to surgery.  If it is not removed, there is a chance that hospital personnel will need to cut it off on the day of surgery.  Do not wear lotions, powders, or perfumes.   Do not shave body hair from the neck down 48 hours before surgery.  Contact lenses, hearing aids and dentures may not be worn into surgery.  Do not bring valuables to the hospital. [facility] is not responsible for any missing/lost belongings or valuables.   Notify your doctor if there is any change in your medical condition (cold, fever, infection).  Wear comfortable clothing (specific to your surgery type) to the hospital.  After surgery, you can help prevent lung complications by doing breathing exercises.  Take deep breaths and cough every 1-2 hours. Your doctor may order a device called an Incentive Spirometer to help you take deep breaths.  If you are being discharged the day of surgery, you will not be allowed to drive home. You will need a responsible individual to drive you home and stay with you for 24 hours  after surgery.   If you are taking public transportation, you will need to have a responsible individual with you.  Please call the Pre-admissions Testing Dept. at [phone] if you have any questions about these instructions.  Surgery Visitation Policy:  Patients having surgery or a procedure may have two visitors.  Children under the age of 21 must have an adult with them who is not the patient.          Preparing for  Surgery with CHLORHEXIDINE  GLUCONATE (CHG) Soap  Chlorhexidine  Gluconate (CHG) Soap  o An antiseptic cleaner that kills germs and bonds with the skin to continue killing germs even after washing  o Used for showering the night before surgery and morning of surgery  Before surgery, you can play an important role by reducing the number of germs on your skin.  CHG (Chlorhexidine  gluconate) soap is an antiseptic cleanser which kills germs and bonds with the skin to continue killing germs even after washing.  Please do not use if you have an allergy to CHG or antibacterial soaps. If your skin becomes reddened/irritated stop using the CHG.  1. Shower the NIGHT BEFORE SURGERY and the MORNING OF SURGERY with CHG soap.  2. If you choose to wash your hair, wash your hair first as usual with your normal shampoo.  3. After shampooing, rinse your hair and body thoroughly to remove the shampoo.  4. Use CHG as you would any other liquid soap. You can apply CHG directly to the skin and wash gently with a scrungie or a clean washcloth.  5. Apply the CHG soap to your body only from the neck down. Do not use on open wounds or open sores. Avoid contact with your eyes, ears, mouth, and genitals (private parts). Wash face and genitals (private parts) with your normal soap.  6. Wash thoroughly, paying special attention to the area where your surgery will be performed.  7. Thoroughly rinse your body with warm water.  8. Do not shower/wash with your normal soap after using and rinsing off the CHG soap.  9. Pat yourself dry with a clean towel.  10. Wear clean pajamas to bed the night before surgery.  12. Place clean sheets on your bed the night of your first shower and do not sleep with pets.  13. Shower again with the CHG soap on the day of surgery prior to arriving at the hospital.  14. Do not apply any deodorants/lotions/powders.  15. Please wear clean clothes to the hospital.

## 2024-04-28 MED ORDER — CHLORHEXIDINE GLUCONATE 0.12 % MT SOLN
15.0000 mL | Freq: Once | OROMUCOSAL | Status: AC
  Administered 2024-04-29: 15 mL via OROMUCOSAL

## 2024-04-28 MED ORDER — ORAL CARE MOUTH RINSE: 15.0000 mL | Freq: Once | OROMUCOSAL | Status: AC

## 2024-04-28 MED ORDER — CHLORHEXIDINE GLUCONATE CLOTH 2 % EX PADS
6.0000 | MEDICATED_PAD | Freq: Once | CUTANEOUS | Status: AC
  Administered 2024-04-29: 6 via TOPICAL

## 2024-04-28 MED ORDER — CHLORHEXIDINE GLUCONATE CLOTH 2 % EX PADS
6.0000 | MEDICATED_PAD | Freq: Once | CUTANEOUS | Status: AC
  Administered 2024-04-28: 6 via TOPICAL

## 2024-04-28 MED ORDER — SODIUM CHLORIDE 0.9 % IV SOLN: INTRAVENOUS | Status: DC

## 2024-04-28 MED ORDER — CEFAZOLIN SODIUM-DEXTROSE 2-4 GM/100ML-% IV SOLN
2.0000 g | INTRAVENOUS | Status: AC
  Administered 2024-04-29: 2 g via INTRAVENOUS

## 2024-04-29 ENCOUNTER — Other Ambulatory Visit: Payer: Self-pay

## 2024-04-29 ENCOUNTER — Ambulatory Visit
Admission: RE | Admit: 2024-04-29 | Discharge: 2024-04-29 | Disposition: A | Discharge status: Home / Self Care | Source: Ambulatory Visit | Attending: Vascular Surgery | Admitting: Vascular Surgery

## 2024-04-29 ENCOUNTER — Encounter
Admission: RE | Disposition: A | Discharge status: Home / Self Care | Payer: Self-pay | Source: Ambulatory Visit | Attending: Vascular Surgery

## 2024-04-29 ENCOUNTER — Ambulatory Visit: Payer: Self-pay | Admitting: Urgent Care

## 2024-04-29 ENCOUNTER — Encounter: Payer: Self-pay | Admitting: Vascular Surgery

## 2024-04-29 DIAGNOSIS — T82898A Other specified complication of vascular prosthetic devices, implants and grafts, initial encounter: Secondary | ICD-10-CM

## 2024-04-29 DIAGNOSIS — Z7902 Long term (current) use of antithrombotics/antiplatelets: Secondary | ICD-10-CM | POA: Insufficient documentation

## 2024-04-29 DIAGNOSIS — E1122 Type 2 diabetes mellitus with diabetic chronic kidney disease: Secondary | ICD-10-CM | POA: Insufficient documentation

## 2024-04-29 DIAGNOSIS — N186 End stage renal disease: Secondary | ICD-10-CM | POA: Insufficient documentation

## 2024-04-29 DIAGNOSIS — I120 Hypertensive chronic kidney disease with stage 5 chronic kidney disease or end stage renal disease: Secondary | ICD-10-CM | POA: Insufficient documentation

## 2024-04-29 DIAGNOSIS — E1169 Type 2 diabetes mellitus with other specified complication: Secondary | ICD-10-CM | POA: Diagnosis not present

## 2024-04-29 DIAGNOSIS — Z992 Dependence on renal dialysis: Secondary | ICD-10-CM | POA: Insufficient documentation

## 2024-04-29 DIAGNOSIS — E785 Hyperlipidemia, unspecified: Secondary | ICD-10-CM | POA: Diagnosis not present

## 2024-04-29 DIAGNOSIS — N183 Chronic kidney disease, stage 3 unspecified: Secondary | ICD-10-CM

## 2024-04-29 DIAGNOSIS — Z01812 Encounter for preprocedural laboratory examination: Secondary | ICD-10-CM

## 2024-04-29 HISTORY — PX: LIGATION OF ARTERIOVENOUS  FISTULA: SHX5948

## 2024-04-29 HISTORY — PX: AV FISTULA PLACEMENT: SHX1204

## 2024-04-29 LAB — GLUCOSE, CAPILLARY: Glucose-Capillary: 101 mg/dL — ABNORMAL HIGH (ref 70–99)

## 2024-04-29 LAB — POCT I-STAT, CHEM 8
BUN: 23 mg/dL (ref 8–23)
Calcium, Ion: 1.02 mmol/L — ABNORMAL LOW (ref 1.15–1.40)
Chloride: 96 mmol/L — ABNORMAL LOW (ref 98–111)
Creatinine, Ser: 5.4 mg/dL — ABNORMAL HIGH (ref 0.61–1.24)
Glucose, Bld: 103 mg/dL — ABNORMAL HIGH (ref 70–99)
HCT: 32 % — ABNORMAL LOW (ref 39.0–52.0)
Hemoglobin: 10.9 g/dL — ABNORMAL LOW (ref 13.0–17.0)
Potassium: 3.6 mmol/L (ref 3.5–5.1)
Sodium: 135 mmol/L (ref 135–145)
TCO2: 30 mmol/L (ref 22–32)

## 2024-04-29 SURGERY — ARTERIOVENOUS (AV) FISTULA CREATION
Anesthesia: General | Site: Arm Upper | Laterality: Right

## 2024-04-29 MED ORDER — HEPARIN SODIUM (PORCINE) 5000 UNIT/ML IJ SOLN
INTRAMUSCULAR | Status: AC
  Filled 2024-04-29: qty 1

## 2024-04-29 MED ORDER — HYDROCODONE-ACETAMINOPHEN 5-325 MG PO TABS
1.0000 | ORAL_TABLET | Freq: Four times a day (QID) | ORAL | 0 refills | Status: AC | PRN
Start: 2024-04-29 — End: ?

## 2024-04-29 MED ORDER — ONDANSETRON HCL 4 MG/2ML IJ SOLN
INTRAMUSCULAR | Status: DC | PRN
  Administered 2024-04-29: 4 mg via INTRAVENOUS

## 2024-04-29 MED ORDER — HYDROMORPHONE HCL 1 MG/ML IJ SOLN
INTRAMUSCULAR | Status: AC
  Filled 2024-04-29: qty 1

## 2024-04-29 MED ORDER — KETAMINE HCL 50 MG/5ML IJ SOSY
PREFILLED_SYRINGE | INTRAMUSCULAR | Status: AC
  Filled 2024-04-29: qty 5

## 2024-04-29 MED ORDER — PHENYLEPHRINE HCL-NACL 20-0.9 MG/250ML-% IV SOLN
INTRAVENOUS | Status: AC
  Filled 2024-04-29: qty 250

## 2024-04-29 MED ORDER — CEFAZOLIN SODIUM-DEXTROSE 2-4 GM/100ML-% IV SOLN
INTRAVENOUS | Status: AC
  Filled 2024-04-29: qty 100

## 2024-04-29 MED ORDER — PROPOFOL 1000 MG/100ML IV EMUL
INTRAVENOUS | Status: AC
  Filled 2024-04-29: qty 100

## 2024-04-29 MED ORDER — ACETAMINOPHEN 10 MG/ML IV SOLN: 1000.0000 mg | Freq: Once | INTRAVENOUS | Status: DC | PRN

## 2024-04-29 MED ORDER — DROPERIDOL 2.5 MG/ML IJ SOLN: 0.6250 mg | Freq: Once | INTRAMUSCULAR | Status: DC | PRN

## 2024-04-29 MED ORDER — OXYCODONE HCL 5 MG/5ML PO SOLN: 5.0000 mg | Freq: Once | ORAL | Status: DC | PRN

## 2024-04-29 MED ORDER — BUPIVACAINE HCL (PF) 0.5 % IJ SOLN
INTRAMUSCULAR | Status: AC
  Filled 2024-04-29: qty 30

## 2024-04-29 MED ORDER — HYDROMORPHONE HCL 1 MG/ML IJ SOLN
INTRAMUSCULAR | Status: DC | PRN
  Administered 2024-04-29: 1 mg via INTRAVENOUS

## 2024-04-29 MED ORDER — CHLORHEXIDINE GLUCONATE 0.12 % MT SOLN
OROMUCOSAL | Status: AC
  Filled 2024-04-29: qty 15

## 2024-04-29 MED ORDER — KETAMINE HCL 50 MG/5ML IJ SOSY
PREFILLED_SYRINGE | INTRAMUSCULAR | Status: DC | PRN
Start: 2024-04-29 — End: 2024-04-29
  Administered 2024-04-29: 40 mg via INTRAVENOUS
  Administered 2024-04-29: 10 mg via INTRAVENOUS

## 2024-04-29 MED ORDER — FENTANYL CITRATE (PF) 100 MCG/2ML IJ SOLN: 25.0000 ug | INTRAMUSCULAR | Status: DC | PRN

## 2024-04-29 MED ORDER — PROPOFOL 10 MG/ML IV BOLUS
INTRAVENOUS | Status: AC
  Filled 2024-04-29: qty 20

## 2024-04-29 MED ORDER — SODIUM CHLORIDE 0.9 % IV SOLN: INTRAVENOUS | Status: DC | PRN

## 2024-04-29 MED ORDER — GLYCOPYRROLATE 0.2 MG/ML IJ SOLN
INTRAMUSCULAR | Status: DC | PRN
  Administered 2024-04-29: .2 mg via INTRAVENOUS

## 2024-04-29 MED ORDER — BUPIVACAINE LIPOSOME 1.3 % IJ SUSP
INTRAMUSCULAR | Status: AC
  Filled 2024-04-29: qty 20

## 2024-04-29 MED ORDER — MIDAZOLAM HCL 2 MG/2ML IJ SOLN
INTRAMUSCULAR | Status: AC
  Filled 2024-04-29: qty 2

## 2024-04-29 MED ORDER — SODIUM CHLORIDE 0.9 % IR SOLN
Status: DC | PRN
  Administered 2024-04-29: 501 mL

## 2024-04-29 MED ORDER — PHENYLEPHRINE HCL-NACL 20-0.9 MG/250ML-% IV SOLN
INTRAVENOUS | Status: DC | PRN
  Administered 2024-04-29: 70 ug/min via INTRAVENOUS

## 2024-04-29 MED ORDER — PHENYLEPHRINE 80 MCG/ML (10ML) SYRINGE FOR IV PUSH (FOR BLOOD PRESSURE SUPPORT)
PREFILLED_SYRINGE | INTRAVENOUS | Status: DC | PRN
  Administered 2024-04-29 (×3): 160 ug via INTRAVENOUS

## 2024-04-29 MED ORDER — LIDOCAINE HCL (CARDIAC) PF 100 MG/5ML IV SOSY
PREFILLED_SYRINGE | INTRAVENOUS | Status: DC | PRN
  Administered 2024-04-29: 100 mg via INTRAVENOUS

## 2024-04-29 MED ORDER — SEVOFLURANE IN SOLN
RESPIRATORY_TRACT | Status: AC
  Filled 2024-04-29: qty 250

## 2024-04-29 MED ORDER — MIDAZOLAM HCL 2 MG/2ML IJ SOLN
INTRAMUSCULAR | Status: DC | PRN
  Administered 2024-04-29: 2 mg via INTRAVENOUS

## 2024-04-29 MED ORDER — HEMOSTATIC AGENTS (NO CHARGE) OPTIME
TOPICAL | Status: DC | PRN
  Administered 2024-04-29: 1 via TOPICAL

## 2024-04-29 MED ORDER — ESMOLOL HCL 100 MG/10ML IV SOLN
INTRAVENOUS | Status: DC | PRN
  Administered 2024-04-29: 20 mg via INTRAVENOUS

## 2024-04-29 MED ORDER — OXYCODONE HCL 5 MG PO TABS: 5.0000 mg | ORAL_TABLET | Freq: Once | ORAL | Status: DC | PRN

## 2024-04-29 MED ORDER — PROPOFOL 10 MG/ML IV BOLUS
INTRAVENOUS | Status: DC | PRN
  Administered 2024-04-29: 30 mg via INTRAVENOUS
  Administered 2024-04-29: 100 mg via INTRAVENOUS
  Administered 2024-04-29: 20 mg via INTRAVENOUS

## 2024-04-29 MED ORDER — BUPIVACAINE LIPOSOME 1.3 % IJ SUSP
INTRAMUSCULAR | Status: DC | PRN
  Administered 2024-04-29: 30 mL

## 2024-04-29 SURGICAL SUPPLY — 47 items
BAG DECANTER FOR FLEXI CONT (MISCELLANEOUS) ×2 IMPLANT
BLADE SURG SZ11 CARB STEEL (BLADE) ×2 IMPLANT
BRUSH SCRUB EZ 4% CHG (MISCELLANEOUS) ×2 IMPLANT
CHLORAPREP W/TINT 26 (MISCELLANEOUS) ×2 IMPLANT
CLAMP SUTURE YELLOW 5 PAIRS (MISCELLANEOUS) ×2 IMPLANT
CLIP APPLIE 11 MED OPEN (CLIP) IMPLANT
CLIP APPLIE 9.375 SM OPEN (CLIP) IMPLANT
CLIP TI MEDIUM 6 (CLIP) IMPLANT
CLIP TI WIDE RED SMALL 6 (CLIP) IMPLANT
DERMABOND ADVANCED .7 DNX12 (GAUZE/BANDAGES/DRESSINGS) ×2 IMPLANT
DRAPE EXTREMITY 106X87X128.5 (DRAPES) IMPLANT
DRAPE SURG ORHT 6 SPLT 77X108 (DRAPES) IMPLANT
DRESSING SURGICEL FIBRLLR 1X2 (HEMOSTASIS) ×2 IMPLANT
ELECT CAUTERY BLADE 6.4 (BLADE) ×2 IMPLANT
ELECTRODE REM PT RTRN 9FT ADLT (ELECTROSURGICAL) ×2 IMPLANT
GEL ULTRASOUND 20GR AQUASONIC (MISCELLANEOUS) IMPLANT
GLOVE BIO SURGEON STRL SZ7 (GLOVE) ×2 IMPLANT
GLOVE SURG SYN 8.0 (GLOVE) ×2 IMPLANT
GLOVE SURG SYN 8.0 PF PI (GLOVE) ×2 IMPLANT
GOWN STRL REUS W/ TWL LRG LVL3 (GOWN DISPOSABLE) ×4 IMPLANT
GOWN STRL REUS W/ TWL XL LVL3 (GOWN DISPOSABLE) ×2 IMPLANT
IV NS 500ML BAXH (IV SOLUTION) ×2 IMPLANT
KIT TURNOVER KIT A (KITS) ×2 IMPLANT
LABEL OR SOLS (LABEL) ×2 IMPLANT
LOOP VESSEL MAXI 1X406 RED (MISCELLANEOUS) ×2 IMPLANT
LOOP VESSEL MINI 0.8X406 BLUE (MISCELLANEOUS) ×4 IMPLANT
MANIFOLD NEPTUNE II (INSTRUMENTS) ×2 IMPLANT
NDL FILTER BLUNT 18X1 1/2 (NEEDLE) ×2 IMPLANT
NDL HYPO 30X.5 LL (NEEDLE) IMPLANT
NEEDLE FILTER BLUNT 18X1 1/2 (NEEDLE) ×2 IMPLANT
NEEDLE HYPO 30X.5 LL (NEEDLE) IMPLANT
PACK EXTREMITY ARMC (MISCELLANEOUS) ×2 IMPLANT
PAD PREP OB/GYN DISP 24X41 (PERSONAL CARE ITEMS) ×2 IMPLANT
PENCIL SMOKE EVACUATOR (MISCELLANEOUS) ×2 IMPLANT
STOCKINETTE 48X4 2 PLY STRL (GAUZE/BANDAGES/DRESSINGS) ×2 IMPLANT
STOCKINETTE STRL 4IN 9604848 (GAUZE/BANDAGES/DRESSINGS) ×2 IMPLANT
SUT ETHIBOND 0 36 GRN (SUTURE) IMPLANT
SUT MNCRL+ 5-0 UNDYED PC-3 (SUTURE) ×2 IMPLANT
SUT PROLENE 6 0 BV (SUTURE) ×8 IMPLANT
SUT SILK 2-0 18XBRD TIE 12 (SUTURE) ×2 IMPLANT
SUT SILK 3-0 18XBRD TIE 12 (SUTURE) ×2 IMPLANT
SUT SILK 4-0 18XBRD TIE 12 (SUTURE) ×2 IMPLANT
SUT VIC AB 3-0 SH 27X BRD (SUTURE) ×2 IMPLANT
SYR 20ML LL LF (SYRINGE) ×2 IMPLANT
SYR 3ML LL SCALE MARK (SYRINGE) ×2 IMPLANT
TRAP FLUID SMOKE EVACUATOR (MISCELLANEOUS) ×2 IMPLANT
WATER STERILE IRR 500ML POUR (IV SOLUTION) ×2 IMPLANT

## 2024-04-29 NOTE — Anesthesia Procedure Notes (Signed)
 Procedure Name: LMA Insertion Date/Time: 04/29/2024 10:08 AM  Performed by: Jean Michaelis., CRNAPre-anesthesia Checklist: Patient identified, Patient being monitored, Timeout performed, Emergency Drugs available and Suction available Patient Re-evaluated:Patient Re-evaluated prior to induction Oxygen Delivery Method: Circle system utilized Preoxygenation: Pre-oxygenation with 100% oxygen Induction Type: IV induction Ventilation: Mask ventilation without difficulty LMA: LMA inserted LMA Size: 4.0 Tube type: Oral Number of attempts: 1 Placement Confirmation: positive ETCO2 and breath sounds checked- equal and bilateral Tube secured with: Tape Dental Injury: Teeth and Oropharynx as per pre-operative assessment

## 2024-04-29 NOTE — Anesthesia Preprocedure Evaluation (Addendum)
 Anesthesia Evaluation  Patient identified by MRN, date of birth, ID band Patient awake    Reviewed: Allergy & Precautions, H&P , NPO status , Patient's Chart, lab work & pertinent test results, reviewed documented beta blocker date and time   Airway Mallampati: III  TM Distance: >3 FB Neck ROM: full    Dental  (+) Edentulous Lower, Edentulous Upper   Pulmonary neg pulmonary ROS   Pulmonary exam normal        Cardiovascular hypertension, Pt. on home beta blockers and Pt. on medications Normal cardiovascular exam     Neuro/Psych  PSYCHIATRIC DISORDERS (Chronic schizoaffective disorder)    Schizophrenia  CVA (2012)    GI/Hepatic negative GI ROS, Neg liver ROS,,,  Endo/Other  diabetes    Renal/GU ESRF and DialysisRenal disease  negative genitourinary   Musculoskeletal   Abdominal Normal abdominal exam  (+)   Peds  Hematology negative hematology ROS (+)   Anesthesia Other Findings AV fistula occlusion last HD monday  Past Medical History: No date: Anemia 2012: Cerebral hemorrhage (HCC) No date: Chronic constipation No date: Chronic kidney disease No date: Diabetes mellitus without complication (HCC) No date: Hemodialysis patient (HCC) No date: Hyperlipidemia No date: Hypertension No date: Schizophrenia (HCC) No date: Stroke [facility])  Past Surgical History: 02/24/2017: A/V FISTULAGRAM; Left     Comment:  Procedure: A/V Fistulagram;  Surgeon: Jackquelyn Mass,              MD;  Location: [facility];  Service:               Cardiovascular;  Laterality: Left; 09/03/2020: A/V FISTULAGRAM; Left     Comment:  Procedure: A/V FISTULAGRAM;  Surgeon: Celso College, MD;               Location: [facility];  Service: Cardiovascular;              Laterality: Left; 06/27/2021: A/V FISTULAGRAM; Left     Comment:  Procedure: A/V FISTULAGRAM;  Surgeon: Celso College, MD;               Location: [facility];  Service: Cardiovascular;              Laterality: Left; 07/24/2021: A/V FISTULAGRAM; Left     Comment:  Procedure: A/V FISTULAGRAM;  Surgeon: Celso College, MD;               Location: [facility];  Service: Cardiovascular;              Laterality: Left; 03/25/2022: A/V FISTULAGRAM; Left     Comment:  Procedure: A/V Fistulagram;  Surgeon: Lesta Rater,               MD;  Location: [facility];  Service:               Cardiovascular;  Laterality: Left; 06/12/2022: A/V FISTULAGRAM; N/A     Comment:  Procedure: A/V Fistulagram;  Surgeon: Jackquelyn Mass, MD;  Location: [facility];  Service:               Cardiovascular;  Laterality: N/A; 12/03/2021: DIALYSIS/PERMA CATHETER INSERTION; N/A     Comment:  Procedure: DIALYSIS/PERMA CATHETER INSERTION;  Surgeon:               Celso College, MD;  Location: [facility];                Service: Cardiovascular;  Laterality: N/A; 03/27/2022: DIALYSIS/PERMA CATHETER INSERTION; N/A     Comment:  Procedure: DIALYSIS/PERMA CATHETER INSERTION;  Surgeon:               Celso College, MD;  Location: [facility];                Service: Cardiovascular;  Laterality: N/A; 03/13/2022: DIALYSIS/PERMA CATHETER REMOVAL; N/A     Comment:  Procedure: DIALYSIS/PERMA CATHETER REMOVAL;  Surgeon:               Celso College, MD;  Location: [facility];                Service: Cardiovascular;  Laterality: N/A; 04/17/2015: PERIPHERAL VASCULAR CATHETERIZATION; N/A     Comment:  Procedure: Dialysis/Perma Catheter Insertion;  Surgeon:               Jackquelyn Mass, MD;  Location: [facility];                Service: Cardiovascular;  Laterality: N/A; 07/30/2015: PERIPHERAL VASCULAR CATHETERIZATION; Left     Comment:  Procedure: A/V Shuntogram/Fistulagram;  Surgeon: Celso College, MD;  Location: [facility];  Service:               Cardiovascular;  Laterality: Left; 07/30/2015:  PERIPHERAL VASCULAR CATHETERIZATION; Left     Comment:  Procedure: A/V Shunt Intervention;  Surgeon: Celso College, MD;  Location: [facility];  Service:               Cardiovascular;  Laterality: Left; 08/20/2015: PERIPHERAL VASCULAR CATHETERIZATION; N/A     Comment:  Procedure: DIALYSIS/PERMA CATHETER REMOVAL;  Surgeon:               Celso College, MD;  Location: [facility];                Service: Cardiovascular;  Laterality: N/A; 03/27/2022: PERIPHERAL VASCULAR THROMBECTOMY; Left     Comment:  Procedure: PERIPHERAL VASCULAR THROMBECTOMY;  Surgeon:               Celso College, MD;  Location: [facility];                Service: Cardiovascular;  Laterality: Left; 08/04/2022: PERIPHERAL VASCULAR THROMBECTOMY; Left     Comment:  Procedure: PERIPHERAL VASCULAR THROMBECTOMY;  Surgeon:               Celso College, MD;  Location: [facility];                Service: Cardiovascular;  Laterality: Left; 09/01/2023: PERIPHERAL VASCULAR THROMBECTOMY; Left     Comment:  Procedure: PERIPHERAL VASCULAR THROMBECTOMY;  Surgeon:               Celso College, MD;  Location: [facility];                Service: Cardiovascular;  Laterality: Left; 12/04/2021: REVISON OF ARTERIOVENOUS FISTULA; Left     Comment:  Procedure: REVISON OF ARTERIOVENOUS FISTULA;  Surgeon:  Celso College, MD;  Location: [facility];  Service:               Vascular;  Laterality: Left; 06/11/2022: TEMPORARY DIALYSIS CATHETER; N/A     Comment:  Procedure: TEMPORARY DIALYSIS CATHETER;  Surgeon:               Jackquelyn Mass, MD;  Location: [facility];               Service: Cardiovascular;  Laterality: N/A; 12/04/2021: THROMBECTOMY W/ EMBOLECTOMY; Left     Comment:  Procedure: THROMBECTOMY ARTERIOVENOUS FISTULA;  Surgeon:              Celso College, MD;  Location: [facility];  Service:               Vascular;  Laterality: Left; No date: VASCULAR SURGERY     Comment:   Fistula placement  BMI    Body Mass Index: 24.58 kg/m      Reproductive/Obstetrics negative OB ROS                             Anesthesia Physical Anesthesia Plan  ASA: 3  Anesthesia Plan: General   Post-op Pain Management: Ofirmev  IV (intra-op)* and Regional block*   Induction: Intravenous  PONV Risk Score and Plan:   Airway Management Planned: LMA  Additional Equipment:   Intra-op Plan:   Post-operative Plan: Extubation in OR  Informed Consent: I have reviewed the patients History and Physical, chart, labs and discussed the procedure including the risks, benefits and alternatives for the proposed anesthesia with the patient or authorized representative who has indicated his/her understanding and acceptance.     Dental Advisory Given  Plan Discussed with: CRNA and Surgeon  Anesthesia Plan Comments:         Anesthesia Quick Evaluation

## 2024-04-29 NOTE — Op Note (Signed)
 OPERATIVE NOTE   PROCEDURE:  right brachial cephalic arteriovenous fistula placement. 2.  Ligation left arm AV graft  PRE-OPERATIVE DIAGNOSIS: End Stage Renal Disease  POST-OPERATIVE DIAGNOSIS: End Stage Renal Disease  SURGEON: Devon Fogo  ASSISTANT(S): none  ANESTHESIA: general  ESTIMATED BLOOD LOSS: <50 cc  FINDING(S): Vein was very scared into surrounding tissue  SPECIMEN(S):  none  INDICATIONS:   Lawrence [name] is a 68 y.o. male who presents with end stage renal disease.  The patient is scheduled for right brachiocephalic arteriovenous fistula placement.  The patient is aware the risks include but are not limited to: bleeding, infection, steal syndrome, nerve damage, ischemic monomelic neuropathy, failure to mature, and need for additional procedures.  The patient is aware of the risks of the procedure and elects to proceed forward.  DESCRIPTION: After full informed written consent was obtained from the patient, the patient was brought back to the operating room and placed supine upon the operating table.  Prior to induction, the patient received IV antibiotics.   After obtaining adequate anesthesia, the patient was then prepped and draped in the standard fashion for a right arm access procedure.   A curvilinear incision was then created midway between the radial impulse and the cephalic vein. The cephalic vein was then identified and dissected circumferentially. It was marked with a surgical marker.    Attention was then turned to the brachial artery which was exposed through the same incision and looped proximally and distally. Side branches were controlled with 4-0 silk ties.  The distal segment of the vein was ligated with a  2-0 silk, and the vein was transected.  The proximal segment was interrogated with serial dilators.  The vein accepted up to a 4 mm dilator without any difficulty. Heparinized saline was infused into the vein and clamped it with a small  bulldog.  At this point, I reset my exposure of the brachial artery and controlled the artery with vessel loops proximally and distally.  An arteriotomy was then made with a #11 blade, and extended with a Potts scissor.  Heparinized saline was injected proximal and distal into the radial artery.  The vein was then approximated to the artery while the artery was in its native bed and subsequently the vein was beveled using Potts scissors. The vein was then sewn to the artery in an end-to-side configuration with a running stitch of 6-0 Prolene.  Prior to completing this anastomosis Flushing maneuvers were performed and the artery was allowed to forward and back bleed.  There was no evidence of clot from any vessels.  I completed the anastomosis in the usual fashion and then released all vessel loops and clamps.    There was good  thrill in the venous outflow, and there was 1+ palpable radial pulse.  At this point, I irrigated out the surgical wound.  There was no further active bleeding.  The subcutaneous tissue was reapproximated with a running stitch of 3-0 Vicryl.  The skin was then reapproximated with a running subcuticular stitch of 4-0 Vicryl.  The skin was then cleaned, dried, and reinforced with Dermabond..  Attention was then turned to the left arm.  A small transverse incision was made overlying the AV graft.  Dissection was carried down to expose the graft right ankle was then passed posteriorly to the graft and two 2-0 silk ties were used to ligate the graft.  Wound was irrigated closed in layers using 3-0 Vicryl followed by 4-0 Monocryl  subcuticular and Dermabond.  The patient tolerated this procedure well.   COMPLICATIONS: None  CONDITION: [name]  Office: [phone]   04/29/2024, 10:30 AM

## 2024-04-29 NOTE — Progress Notes (Signed)
 MRN : [id number]  Lawrence [name] is a 68 y.o. ([birth year]) male who presents with chief complaint of check access.  History of Present Illness:   Patient presents to [facility] today for creation of an AV fistula and ligation of his 9 usable fistula.  Patient was last seen in the office March 24, 2024.  He has a left graft revision of the brachiocephalic AV fistula which has continued to have multiple issues despite revascularization.  He is currently maintained via PermCath.  His original left brachiocephalic AV fistula was placed in 2016 with a jump graft revision in 2022 goal     The patient denies redness or swelling at the access site. The patient denies fever or chills at home or while on dialysis.   No recent shortening of the patient's walking distance or new symptoms consistent with claudication.  No history of rest pain symptoms. No new ulcers or wounds of the lower extremities have occurred.   The patient denies amaurosis fugax or recent TIA symptoms. There are no recent neurological changes noted. There is no history of DVT, PE or superficial thrombophlebitis. No recent episodes of angina or shortness of breath documented.    Today noninvasive studies show adequate access for a right brachiocephalic AV fistula,  Current Meds  Medication Sig   acetaminophen  (TYLENOL ) 500 MG tablet Take 500 mg by mouth every 4 (four) hours as needed for moderate pain (pain score 4-6).   aspirin  EC 81 MG tablet Take 81 mg by mouth daily.    benztropine  (COGENTIN ) 0.5 MG tablet Take 0.5 mg by mouth 2 (two) times daily.   cetirizine (ZYRTEC) 10 MG tablet Take 10 mg by mouth every other day.   Cholecalciferol  (VITAMIN D3) 125 MCG (5000 UT) CAPS Take 5,000 Units by mouth once a week.   clopidogrel  (PLAVIX ) 75 MG tablet Take 1 tablet (75 mg total) by mouth daily.   diphenhydrAMINE  (BENADRYL ) 25 mg capsule Take 25 mg by mouth See admin instructions.  Take 1 capsule (25mg ) by mouth nightly as needed for sleep - may take 1 capsule (25mg ) by mouth during the daytime as needed for itching   docusate sodium  (COLACE) 100 MG capsule Take 100 mg by mouth 2 (two) times daily.   feeding supplement (BOOST HIGH PROTEIN) LIQD Take 1 Container by mouth See admin instructions. Take 1 container by mouth three times a week or as needed of Nutritional Supplement   fluticasone  (FLONASE ) 50 MCG/ACT nasal spray Place 2 sprays into both nostrils daily.   haloperidol  (HALDOL ) 10 MG tablet Take 5-10 mg by mouth See admin instructions. Take 5 mg in the morning and 10 mg at bedtime   mirtazapine  (REMERON ) 15 MG tablet Take 15 mg by mouth at bedtime.   Multiple Vitamin (DAILY-VITE) TABS Take 1 tablet by mouth daily.   niacin  (NIASPAN ) 1000 MG CR tablet Take 1,000 mg by mouth at bedtime.    patiromer  (VELTASSA ) 8.4 g packet Take 8.4 g by mouth daily.   polyethylene glycol (MIRALAX  / GLYCOLAX ) 17 g packet Take 17 g by mouth daily as needed (Constipation). Mix 17 g (1 capful) in to 8 ounces of Juice/Water   pravastatin  (PRAVACHOL ) 40 MG tablet Take 40 mg by mouth at bedtime.     Past Medical History:  Diagnosis Date   Anemia    Cerebral hemorrhage (HCC) 2012  Chronic constipation    Chronic kidney disease    Diabetes mellitus without complication (HCC)    Hemodialysis patient (HCC)    Hyperlipidemia    Hypertension    Schizophrenia (HCC)    Stroke [facility])     Past Surgical History:  Procedure Laterality Date   A/V FISTULAGRAM Left 02/24/2017   Procedure: A/V Fistulagram;  Surgeon: Jackquelyn Mass, MD;  Location: [facility];  Service: Cardiovascular;  Laterality: Left;   A/V FISTULAGRAM Left 09/03/2020   Procedure: A/V FISTULAGRAM;  Surgeon: Celso College, MD;  Location: [facility];  Service: Cardiovascular;  Laterality: Left;   A/V FISTULAGRAM Left 06/27/2021   Procedure: A/V FISTULAGRAM;  Surgeon: Celso College, MD;  Location: [facility];  Service: Cardiovascular;  Laterality: Left;   A/V FISTULAGRAM Left 07/24/2021   Procedure: A/V FISTULAGRAM;  Surgeon: Celso College, MD;  Location: [facility];  Service: Cardiovascular;  Laterality: Left;   A/V FISTULAGRAM Left 03/25/2022   Procedure: A/V Fistulagram;  Surgeon: Lesta Rater, MD;  Location: [facility];  Service: Cardiovascular;  Laterality: Left;   A/V FISTULAGRAM N/A 06/12/2022   Procedure: A/V Fistulagram;  Surgeon: Jackquelyn Mass, MD;  Location: [facility];  Service: Cardiovascular;  Laterality: N/A;   A/V SHUNT INTERVENTION N/A 02/29/2024   Procedure: A/V SHUNT INTERVENTION;  Surgeon: Celso College, MD;  Location: [facility];  Service: Cardiovascular;  Laterality: N/A;   DIALYSIS/PERMA CATHETER INSERTION N/A 12/03/2021   Procedure: DIALYSIS/PERMA CATHETER INSERTION;  Surgeon: Celso College, MD;  Location: [facility];  Service: Cardiovascular;  Laterality: N/A;   DIALYSIS/PERMA CATHETER INSERTION N/A 03/27/2022   Procedure: DIALYSIS/PERMA CATHETER INSERTION;  Surgeon: Celso College, MD;  Location: [facility];  Service: Cardiovascular;  Laterality: N/A;   DIALYSIS/PERMA CATHETER INSERTION N/A 03/09/2024   Procedure: DIALYSIS/PERMA CATHETER INSERTION;  Surgeon: Celso College, MD;  Location: [facility];  Service: Cardiovascular;  Laterality: N/A;   DIALYSIS/PERMA CATHETER REMOVAL N/A 03/13/2022   Procedure: DIALYSIS/PERMA CATHETER REMOVAL;  Surgeon: Celso College, MD;  Location: [facility];  Service: Cardiovascular;  Laterality: N/A;   INSERTION OF DIALYSIS CATHETER Right 02/27/2024   Procedure: INSERTION OF DIALYSIS CATHETER;  Surgeon: Arvil Lauber, MD;  Location: [facility];  Service: Vascular;  Laterality: Right;   PERIPHERAL VASCULAR CATHETERIZATION N/A 04/17/2015   Procedure: Dialysis/Perma Catheter Insertion;  Surgeon: Jackquelyn Mass, MD;  Location: [facility];  Service: Cardiovascular;   Laterality: N/A;   PERIPHERAL VASCULAR CATHETERIZATION Left 07/30/2015   Procedure: A/V Shuntogram/Fistulagram;  Surgeon: Celso College, MD;  Location: [facility];  Service: Cardiovascular;  Laterality: Left;   PERIPHERAL VASCULAR CATHETERIZATION Left 07/30/2015   Procedure: A/V Shunt Intervention;  Surgeon: Celso College, MD;  Location: [facility];  Service: Cardiovascular;  Laterality: Left;   PERIPHERAL VASCULAR CATHETERIZATION N/A 08/20/2015   Procedure: DIALYSIS/PERMA CATHETER REMOVAL;  Surgeon: Celso College, MD;  Location: [facility];  Service: Cardiovascular;  Laterality: N/A;   PERIPHERAL VASCULAR THROMBECTOMY Left 03/27/2022   Procedure: PERIPHERAL VASCULAR THROMBECTOMY;  Surgeon: Celso College, MD;  Location: [facility];  Service: Cardiovascular;  Laterality: Left;   PERIPHERAL VASCULAR THROMBECTOMY Left 08/04/2022   Procedure: PERIPHERAL VASCULAR THROMBECTOMY;  Surgeon: Celso College, MD;  Location: [facility];  Service: Cardiovascular;  Laterality: Left;  PERIPHERAL VASCULAR THROMBECTOMY Left 09/01/2023   Procedure: PERIPHERAL VASCULAR THROMBECTOMY;  Surgeon: Celso College, MD;  Location: [facility];  Service: Cardiovascular;  Laterality: Left;   REVISON OF ARTERIOVENOUS FISTULA Left 12/04/2021   Procedure: REVISON OF ARTERIOVENOUS FISTULA;  Surgeon: Celso College, MD;  Location: [facility];  Service: Vascular;  Laterality: Left;   TEMPORARY DIALYSIS CATHETER N/A 06/11/2022   Procedure: TEMPORARY DIALYSIS CATHETER;  Surgeon: Jackquelyn Mass, MD;  Location: [facility];  Service: Cardiovascular;  Laterality: N/A;   THROMBECTOMY W/ EMBOLECTOMY Left 12/04/2021   Procedure: THROMBECTOMY ARTERIOVENOUS FISTULA;  Surgeon: Celso College, MD;  Location: [facility];  Service: Vascular;  Laterality: Left;   VASCULAR SURGERY     Fistula placement    Social History Social History   Tobacco Use   Smoking status: Never   Smokeless tobacco:  Never  Vaping Use   Vaping status: Never Used  Substance Use Topics   Alcohol use: No   Drug use: No    Family History Family History  Problem Relation Age of Onset   Diabetes Mother    Kidney cancer Mother    Diabetes Sister    Stroke Brother    Prostate cancer Neg Hx    Bladder Cancer Neg Hx     Allergies  Allergen Reactions   Nsaids Other (See Comments)    Cannot take due to renal disease     REVIEW OF SYSTEMS (Negative unless checked)  Constitutional: [] Weight loss  [] Fever  [] Chills Cardiac: [] Chest pain   [] Chest pressure   [] Palpitations   [] Shortness of breath when laying flat   [] Shortness of breath with exertion. Vascular:  [] Pain in legs with walking   [] Pain in legs at rest  [] History of DVT   [] Phlebitis   [] Swelling in legs   [] Varicose veins   [] Non-healing ulcers Pulmonary:   [] Uses home oxygen   [] Productive cough   [] Hemoptysis   [] Wheeze  [] COPD   [] Asthma Neurologic:  [] Dizziness   [] Seizures   [] History of stroke   [] History of TIA  [] Aphasia   [] Vissual changes   [] Weakness or numbness in arm   [] Weakness or numbness in leg Musculoskeletal:   [] Joint swelling   [] Joint pain   [] Low back pain Hematologic:  [] Easy bruising  [] Easy bleeding   [] Hypercoagulable state   [] Anemic Gastrointestinal:  [] Diarrhea   [] Vomiting  [] Gastroesophageal reflux/heartburn   [] Difficulty swallowing. Genitourinary:  [x] Chronic kidney disease   [] Difficult urination  [] Frequent urination   [] Blood in urine Skin:  [] Rashes   [] Ulcers  Psychological:  [] History of anxiety   []  History of major depression.  Physical Examination  Vitals:   04/29/24 0632  BP: 132/73  Pulse: 86  Resp: 16  Temp: (!) 97.4 F (36.3 C)  TempSrc: Temporal  SpO2: 100%  Weight: 75.8 kg  Height: 5\' 7"  (1.702 m)   Body mass index is 26.16 kg/m. Gen: WD/WN, NAD Head: [place]/AT, No temporalis wasting.  Ear/Nose/Throat: Hearing grossly intact, nares w/o erythema or drainage Eyes: PER, EOMI, sclera  nonicteric.  Neck: Supple, no gross masses or lesions.  No JVD.  Pulmonary:  Good air movement, no audible wheezing, no use of accessory muscles.  Cardiac: RRR, precordium non-hyperdynamic. Vascular:   Left arm fistula positive thrill positive bruit Vessel Right Left  Radial Palpable Palpable  Brachial Palpable Palpable  Gastrointestinal: soft, non-distended. No guarding/no peritoneal signs.  Musculoskeletal: M/S 5/5 throughout.  No deformity.  Neurologic: CN 2-12  intact. Pain and light touch intact in extremities.  Symmetrical.  Speech is fluent. Motor exam as listed above. Psychiatric: Judgment intact, Mood & affect appropriate for pt's clinical situation. Dermatologic: No rashes or ulcers noted.  No changes consistent with cellulitis.   CBC Lab Results  Component Value Date   WBC 4.8 04/18/2024   HGB 10.9 (L) 04/29/2024   HCT 32.0 (L) 04/29/2024   MCV 99.7 04/18/2024   PLT 88 (L) 04/18/2024    BMET    Component Value Date/Time   NA 135 04/29/2024 0712   NA 140 03/21/2015 1512   K 3.6 04/29/2024 0712   K 4.0 03/21/2015 1512   CL 96 (L) 04/29/2024 0712   CL 95 (L) 03/21/2015 1512   CO2 26 04/18/2024 0901   CO2 36 (H) 03/21/2015 1512   GLUCOSE 103 (H) 04/29/2024 0712   GLUCOSE 117 (H) 03/21/2015 1512   BUN 23 04/29/2024 0712   BUN 64 (H) 03/21/2015 1512   CREATININE 5.40 (H) 04/29/2024 0712   CREATININE 7.71 (H) 03/21/2015 1512   CALCIUM  9.0 04/18/2024 0901   CALCIUM  8.5 (L) 03/21/2015 1512   GFRNONAA 5 (L) 04/18/2024 0901   GFRNONAA 7 (L) 03/21/2015 1512   GFRAA 21 (L) 03/08/2020 1756   GFRAA 8 (L) 03/21/2015 1512   Estimated Creatinine Clearance: 12.4 mL/min (A) (by C-G formula based on SCr of 5.4 mg/dL (H)).  COAG Lab Results  Component Value Date   INR 1.1 02/26/2024   INR 1.1 06/10/2022   INR 1.1 12/13/2014    Radiology No results found.   Assessment/Plan 1. ESRD on dialysis [facility]) (Primary) Recommend:   At this time the patient does not have  appropriate extremity access for dialysis   Patient should have a right brachial cephalic AV fistla created.  Left brachiocephalic AV fistula should be ligated   The risks, benefits and alternative therapies were reviewed in detail with the patient.  All questions were answered.  The patient agrees to proceed with surgery.    The patient will follow up with me in the office after the surgery.   2. Type 2 diabetes mellitus with hyperlipidemia (HCC) Continue hypoglycemic medications as already ordered, these medications have been reviewed and there are no changes at this time.   Hgb A1C to be monitored as already arranged by primary service   3. Essential hypertension Continue antihypertensive medications as already ordered, these medications have been reviewed and there are no changes at this time.     Devon Fogo, MD  04/29/2024 7:32 AM

## 2024-04-29 NOTE — H&P (View-Only) (Signed)
 MRN : [id number]  Lawrence [name] is a 68 y.o. (23-Sep-1956) male who presents with chief complaint of check access.  History of Present Illness:   Patient presents to [facility] today for creation of an AV fistula and ligation of his 9 usable fistula.  Patient was last seen in the office March 24, 2024.  He has a left graft revision of the brachiocephalic AV fistula which has continued to have multiple issues despite revascularization.  He is currently maintained via PermCath.  His original left brachiocephalic AV fistula was placed in 2016 with a jump graft revision in 2022 goal     The patient denies redness or swelling at the access site. The patient denies fever or chills at home or while on dialysis.   No recent shortening of the patient's walking distance or new symptoms consistent with claudication.  No history of rest pain symptoms. No new ulcers or wounds of the lower extremities have occurred.   The patient denies amaurosis fugax or recent TIA symptoms. There are no recent neurological changes noted. There is no history of DVT, PE or superficial thrombophlebitis. No recent episodes of angina or shortness of breath documented.    Today noninvasive studies show adequate access for a right brachiocephalic AV fistula,  Current Meds  Medication Sig   acetaminophen  (TYLENOL ) 500 MG tablet Take 500 mg by mouth every 4 (four) hours as needed for moderate pain (pain score 4-6).   aspirin  EC 81 MG tablet Take 81 mg by mouth daily.    benztropine  (COGENTIN ) 0.5 MG tablet Take 0.5 mg by mouth 2 (two) times daily.   cetirizine (ZYRTEC) 10 MG tablet Take 10 mg by mouth every other day.   Cholecalciferol  (VITAMIN D3) 125 MCG (5000 UT) CAPS Take 5,000 Units by mouth once a week.   clopidogrel  (PLAVIX ) 75 MG tablet Take 1 tablet (75 mg total) by mouth daily.   diphenhydrAMINE  (BENADRYL ) 25 mg capsule Take 25 mg by mouth See admin instructions.  Take 1 capsule (25mg ) by mouth nightly as needed for sleep - may take 1 capsule (25mg ) by mouth during the daytime as needed for itching   docusate sodium  (COLACE) 100 MG capsule Take 100 mg by mouth 2 (two) times daily.   feeding supplement (BOOST HIGH PROTEIN) LIQD Take 1 Container by mouth See admin instructions. Take 1 container by mouth three times a week or as needed of Nutritional Supplement   fluticasone  (FLONASE ) 50 MCG/ACT nasal spray Place 2 sprays into both nostrils daily.   haloperidol  (HALDOL ) 10 MG tablet Take 5-10 mg by mouth See admin instructions. Take 5 mg in the morning and 10 mg at bedtime   mirtazapine  (REMERON ) 15 MG tablet Take 15 mg by mouth at bedtime.   Multiple Vitamin (DAILY-VITE) TABS Take 1 tablet by mouth daily.   niacin  (NIASPAN ) 1000 MG CR tablet Take 1,000 mg by mouth at bedtime.    patiromer  (VELTASSA ) 8.4 g packet Take 8.4 g by mouth daily.   polyethylene glycol (MIRALAX  / GLYCOLAX ) 17 g packet Take 17 g by mouth daily as needed (Constipation). Mix 17 g (1 capful) in to 8 ounces of Juice/Water   pravastatin  (PRAVACHOL ) 40 MG tablet Take 40 mg by mouth at bedtime.     Past Medical History:  Diagnosis Date   Anemia    Cerebral hemorrhage (HCC) 2012  Chronic constipation    Chronic kidney disease    Diabetes mellitus without complication (HCC)    Hemodialysis patient (HCC)    Hyperlipidemia    Hypertension    Schizophrenia (HCC)    Stroke [facility])     Past Surgical History:  Procedure Laterality Date   A/V FISTULAGRAM Left 02/24/2017   Procedure: A/V Fistulagram;  Surgeon: Jackquelyn Mass, MD;  Location: [facility];  Service: Cardiovascular;  Laterality: Left;   A/V FISTULAGRAM Left 09/03/2020   Procedure: A/V FISTULAGRAM;  Surgeon: Celso College, MD;  Location: [facility];  Service: Cardiovascular;  Laterality: Left;   A/V FISTULAGRAM Left 06/27/2021   Procedure: A/V FISTULAGRAM;  Surgeon: Celso College, MD;  Location: [facility];  Service: Cardiovascular;  Laterality: Left;   A/V FISTULAGRAM Left 07/24/2021   Procedure: A/V FISTULAGRAM;  Surgeon: Celso College, MD;  Location: [facility];  Service: Cardiovascular;  Laterality: Left;   A/V FISTULAGRAM Left 03/25/2022   Procedure: A/V Fistulagram;  Surgeon: Lesta Rater, MD;  Location: [facility];  Service: Cardiovascular;  Laterality: Left;   A/V FISTULAGRAM N/A 06/12/2022   Procedure: A/V Fistulagram;  Surgeon: Jackquelyn Mass, MD;  Location: [facility];  Service: Cardiovascular;  Laterality: N/A;   A/V SHUNT INTERVENTION N/A 02/29/2024   Procedure: A/V SHUNT INTERVENTION;  Surgeon: Celso College, MD;  Location: [facility];  Service: Cardiovascular;  Laterality: N/A;   DIALYSIS/PERMA CATHETER INSERTION N/A 12/03/2021   Procedure: DIALYSIS/PERMA CATHETER INSERTION;  Surgeon: Celso College, MD;  Location: [facility];  Service: Cardiovascular;  Laterality: N/A;   DIALYSIS/PERMA CATHETER INSERTION N/A 03/27/2022   Procedure: DIALYSIS/PERMA CATHETER INSERTION;  Surgeon: Celso College, MD;  Location: [facility];  Service: Cardiovascular;  Laterality: N/A;   DIALYSIS/PERMA CATHETER INSERTION N/A 03/09/2024   Procedure: DIALYSIS/PERMA CATHETER INSERTION;  Surgeon: Celso College, MD;  Location: [facility];  Service: Cardiovascular;  Laterality: N/A;   DIALYSIS/PERMA CATHETER REMOVAL N/A 03/13/2022   Procedure: DIALYSIS/PERMA CATHETER REMOVAL;  Surgeon: Celso College, MD;  Location: [facility];  Service: Cardiovascular;  Laterality: N/A;   INSERTION OF DIALYSIS CATHETER Right 02/27/2024   Procedure: INSERTION OF DIALYSIS CATHETER;  Surgeon: Arvil Lauber, MD;  Location: [facility];  Service: Vascular;  Laterality: Right;   PERIPHERAL VASCULAR CATHETERIZATION N/A 04/17/2015   Procedure: Dialysis/Perma Catheter Insertion;  Surgeon: Jackquelyn Mass, MD;  Location: [facility];  Service: Cardiovascular;   Laterality: N/A;   PERIPHERAL VASCULAR CATHETERIZATION Left 07/30/2015   Procedure: A/V Shuntogram/Fistulagram;  Surgeon: Celso College, MD;  Location: [facility];  Service: Cardiovascular;  Laterality: Left;   PERIPHERAL VASCULAR CATHETERIZATION Left 07/30/2015   Procedure: A/V Shunt Intervention;  Surgeon: Celso College, MD;  Location: [facility];  Service: Cardiovascular;  Laterality: Left;   PERIPHERAL VASCULAR CATHETERIZATION N/A 08/20/2015   Procedure: DIALYSIS/PERMA CATHETER REMOVAL;  Surgeon: Celso College, MD;  Location: [facility];  Service: Cardiovascular;  Laterality: N/A;   PERIPHERAL VASCULAR THROMBECTOMY Left 03/27/2022   Procedure: PERIPHERAL VASCULAR THROMBECTOMY;  Surgeon: Celso College, MD;  Location: [facility];  Service: Cardiovascular;  Laterality: Left;   PERIPHERAL VASCULAR THROMBECTOMY Left 08/04/2022   Procedure: PERIPHERAL VASCULAR THROMBECTOMY;  Surgeon: Celso College, MD;  Location: [facility];  Service: Cardiovascular;  Laterality: Left;  PERIPHERAL VASCULAR THROMBECTOMY Left 09/01/2023   Procedure: PERIPHERAL VASCULAR THROMBECTOMY;  Surgeon: Celso College, MD;  Location: [facility];  Service: Cardiovascular;  Laterality: Left;   REVISON OF ARTERIOVENOUS FISTULA Left 12/04/2021   Procedure: REVISON OF ARTERIOVENOUS FISTULA;  Surgeon: Celso College, MD;  Location: [facility];  Service: Vascular;  Laterality: Left;   TEMPORARY DIALYSIS CATHETER N/A 06/11/2022   Procedure: TEMPORARY DIALYSIS CATHETER;  Surgeon: Jackquelyn Mass, MD;  Location: [facility];  Service: Cardiovascular;  Laterality: N/A;   THROMBECTOMY W/ EMBOLECTOMY Left 12/04/2021   Procedure: THROMBECTOMY ARTERIOVENOUS FISTULA;  Surgeon: Celso College, MD;  Location: [facility];  Service: Vascular;  Laterality: Left;   VASCULAR SURGERY     Fistula placement    Social History Social History   Tobacco Use   Smoking status: Never   Smokeless tobacco:  Never  Vaping Use   Vaping status: Never Used  Substance Use Topics   Alcohol use: No   Drug use: No    Family History Family History  Problem Relation Age of Onset   Diabetes Mother    Kidney cancer Mother    Diabetes Sister    Stroke Brother    Prostate cancer Neg Hx    Bladder Cancer Neg Hx     Allergies  Allergen Reactions   Nsaids Other (See Comments)    Cannot take due to renal disease     REVIEW OF SYSTEMS (Negative unless checked)  Constitutional: [] Weight loss  [] Fever  [] Chills Cardiac: [] Chest pain   [] Chest pressure   [] Palpitations   [] Shortness of breath when laying flat   [] Shortness of breath with exertion. Vascular:  [] Pain in legs with walking   [] Pain in legs at rest  [] History of DVT   [] Phlebitis   [] Swelling in legs   [] Varicose veins   [] Non-healing ulcers Pulmonary:   [] Uses home oxygen   [] Productive cough   [] Hemoptysis   [] Wheeze  [] COPD   [] Asthma Neurologic:  [] Dizziness   [] Seizures   [] History of stroke   [] History of TIA  [] Aphasia   [] Vissual changes   [] Weakness or numbness in arm   [] Weakness or numbness in leg Musculoskeletal:   [] Joint swelling   [] Joint pain   [] Low back pain Hematologic:  [] Easy bruising  [] Easy bleeding   [] Hypercoagulable state   [] Anemic Gastrointestinal:  [] Diarrhea   [] Vomiting  [] Gastroesophageal reflux/heartburn   [] Difficulty swallowing. Genitourinary:  [x] Chronic kidney disease   [] Difficult urination  [] Frequent urination   [] Blood in urine Skin:  [] Rashes   [] Ulcers  Psychological:  [] History of anxiety   []  History of major depression.  Physical Examination  Vitals:   04/29/24 0632  BP: 132/73  Pulse: 86  Resp: 16  Temp: (!) 97.4 F (36.3 C)  TempSrc: Temporal  SpO2: 100%  Weight: 75.8 kg  Height: 5\' 7"  (1.702 m)   Body mass index is 26.16 kg/m. Gen: WD/WN, NAD Head: [place]/AT, No temporalis wasting.  Ear/Nose/Throat: Hearing grossly intact, nares w/o erythema or drainage Eyes: PER, EOMI, sclera  nonicteric.  Neck: Supple, no gross masses or lesions.  No JVD.  Pulmonary:  Good air movement, no audible wheezing, no use of accessory muscles.  Cardiac: RRR, precordium non-hyperdynamic. Vascular:   Left arm fistula positive thrill positive bruit Vessel Right Left  Radial Palpable Palpable  Brachial Palpable Palpable  Gastrointestinal: soft, non-distended. No guarding/no peritoneal signs.  Musculoskeletal: M/S 5/5 throughout.  No deformity.  Neurologic: CN 2-12  intact. Pain and light touch intact in extremities.  Symmetrical.  Speech is fluent. Motor exam as listed above. Psychiatric: Judgment intact, Mood & affect appropriate for pt's clinical situation. Dermatologic: No rashes or ulcers noted.  No changes consistent with cellulitis.   CBC Lab Results  Component Value Date   WBC 4.8 04/18/2024   HGB 10.9 (L) 04/29/2024   HCT 32.0 (L) 04/29/2024   MCV 99.7 04/18/2024   PLT 88 (L) 04/18/2024    BMET    Component Value Date/Time   NA 135 04/29/2024 0712   NA 140 03/21/2015 1512   K 3.6 04/29/2024 0712   K 4.0 03/21/2015 1512   CL 96 (L) 04/29/2024 0712   CL 95 (L) 03/21/2015 1512   CO2 26 04/18/2024 0901   CO2 36 (H) 03/21/2015 1512   GLUCOSE 103 (H) 04/29/2024 0712   GLUCOSE 117 (H) 03/21/2015 1512   BUN 23 04/29/2024 0712   BUN 64 (H) 03/21/2015 1512   CREATININE 5.40 (H) 04/29/2024 0712   CREATININE 7.71 (H) 03/21/2015 1512   CALCIUM  9.0 04/18/2024 0901   CALCIUM  8.5 (L) 03/21/2015 1512   GFRNONAA 5 (L) 04/18/2024 0901   GFRNONAA 7 (L) 03/21/2015 1512   GFRAA 21 (L) 03/08/2020 1756   GFRAA 8 (L) 03/21/2015 1512   Estimated Creatinine Clearance: 12.4 mL/min (A) (by C-G formula based on SCr of 5.4 mg/dL (H)).  COAG Lab Results  Component Value Date   INR 1.1 02/26/2024   INR 1.1 06/10/2022   INR 1.1 12/13/2014    Radiology No results found.   Assessment/Plan 1. ESRD on dialysis [facility]) (Primary) Recommend:   At this time the patient does not have  appropriate extremity access for dialysis   Patient should have a right brachial cephalic AV fistla created.  Left brachiocephalic AV fistula should be ligated   The risks, benefits and alternative therapies were reviewed in detail with the patient.  All questions were answered.  The patient agrees to proceed with surgery.    The patient will follow up with me in the office after the surgery.   2. Type 2 diabetes mellitus with hyperlipidemia (HCC) Continue hypoglycemic medications as already ordered, these medications have been reviewed and there are no changes at this time.   Hgb A1C to be monitored as already arranged by primary service   3. Essential hypertension Continue antihypertensive medications as already ordered, these medications have been reviewed and there are no changes at this time.     Devon Fogo, MD  04/29/2024 7:32 AM

## 2024-04-29 NOTE — Transfer of Care (Signed)
  Immediate Anesthesia Transfer of Care Note  Patient: Lawrence [name]  Procedure(s) Performed: ARTERIOVENOUS (AV) FISTULA CREATION (Right: Arm Upper) LIGATION OF ARTERIOVENOUS  FISTULA (Left: Arm Upper)  Patient Location: PACU  Anesthesia Type:General  Level of Consciousness: drowsy  Airway & Oxygen Therapy: Patient Spontanous Breathing and Patient connected to face mask oxygen  Post-op Assessment: Report given to RN and Post -op Vital signs reviewed and stable  Post vital signs: stable  Last Vitals:  Vitals Value Taken Time  BP 125/73 04/29/24 1025  Temp    Pulse 89 04/29/24 1028  Resp 14 04/29/24 1028  SpO2 99 % 04/29/24 1028  Vitals shown include unfiled device data.  Last Pain:  Vitals:   04/29/24 9147  TempSrc: Temporal  PainSc: 0-No pain         Complications: No notable events documented.

## 2024-04-29 NOTE — Interval H&P Note (Signed)
 History and Physical Interval Note:  04/29/2024 7:34 AM  Lawrence [name]  has presented today for surgery, with the diagnosis of ESRD.  The various methods of treatment have been discussed with the patient and family. After consideration of risks, benefits and other options for treatment, the patient has consented to  Procedure(s) with comments: ARTERIOVENOUS (AV) FISTULA CREATION (Right) - BRACHIALCEPHALIC LIGATION OF ARTERIOVENOUS  FISTULA (Left) as a surgical intervention.  The patient's history has been reviewed, patient examined, no change in status, stable for surgery.  I have reviewed the patient's chart and labs.  Questions were answered to the patient's satisfaction.     Devon Fogo

## 2024-04-29 NOTE — Anesthesia Postprocedure Evaluation (Signed)
 Anesthesia Post Note  Patient: Lawrence [name]  Procedure(s) Performed: ARTERIOVENOUS (AV) FISTULA CREATION (Right: Arm Upper) LIGATION OF ARTERIOVENOUS  FISTULA (Left: Arm Upper)  Patient location during evaluation: PACU Anesthesia Type: General Level of consciousness: awake and alert Pain management: pain level controlled Vital Signs Assessment: post-procedure vital signs reviewed and stable Respiratory status: spontaneous breathing, nonlabored ventilation and respiratory function stable Cardiovascular status: blood pressure returned to baseline and stable Postop Assessment: no apparent nausea or vomiting Anesthetic complications: no   There were no known notable events for this encounter.   Last Vitals:  Vitals:   04/29/24 1100 04/29/24 1127  BP: (!) 144/85 (!) 146/82  Pulse: 84 79  Resp:  14  Temp: (!) 36.1 C (!) 36.2 C  SpO2: 100% 100%    Last Pain:  Vitals:   04/29/24 1127  TempSrc: Temporal  PainSc: 0-No pain                 Baltazar Bonier

## 2024-05-03 ENCOUNTER — Encounter: Payer: Self-pay | Admitting: Vascular Surgery

## 2024-05-18 ENCOUNTER — Other Ambulatory Visit (INDEPENDENT_AMBULATORY_CARE_PROVIDER_SITE_OTHER): Payer: Self-pay | Admitting: Vascular Surgery

## 2024-05-18 DIAGNOSIS — Z9889 Other specified postprocedural states: Secondary | ICD-10-CM

## 2024-05-18 DIAGNOSIS — N186 End stage renal disease: Secondary | ICD-10-CM

## 2024-05-19 ENCOUNTER — Ambulatory Visit (INDEPENDENT_AMBULATORY_CARE_PROVIDER_SITE_OTHER)

## 2024-05-19 DIAGNOSIS — Z9889 Other specified postprocedural states: Secondary | ICD-10-CM

## 2024-05-19 DIAGNOSIS — N186 End stage renal disease: Secondary | ICD-10-CM

## 2024-05-27 ENCOUNTER — Encounter: Payer: Self-pay | Admitting: Vascular Surgery

## 2024-07-28 ENCOUNTER — Other Ambulatory Visit (INDEPENDENT_AMBULATORY_CARE_PROVIDER_SITE_OTHER): Payer: Self-pay | Admitting: Vascular Surgery

## 2024-07-28 DIAGNOSIS — Z9889 Other specified postprocedural states: Secondary | ICD-10-CM

## 2024-07-28 DIAGNOSIS — N186 End stage renal disease: Secondary | ICD-10-CM

## 2024-08-04 ENCOUNTER — Ambulatory Visit (INDEPENDENT_AMBULATORY_CARE_PROVIDER_SITE_OTHER): Payer: Medicare Other

## 2024-08-04 ENCOUNTER — Encounter (INDEPENDENT_AMBULATORY_CARE_PROVIDER_SITE_OTHER): Payer: Self-pay | Admitting: Nurse Practitioner

## 2024-08-04 ENCOUNTER — Ambulatory Visit (INDEPENDENT_AMBULATORY_CARE_PROVIDER_SITE_OTHER): Payer: Medicare Other | Admitting: Nurse Practitioner

## 2024-08-04 VITALS — BP 130/84 | HR 88 | Ht 67.0 in | Wt 161.4 lb

## 2024-08-04 DIAGNOSIS — E1169 Type 2 diabetes mellitus with other specified complication: Secondary | ICD-10-CM

## 2024-08-04 DIAGNOSIS — I10 Essential (primary) hypertension: Secondary | ICD-10-CM

## 2024-08-04 DIAGNOSIS — E785 Hyperlipidemia, unspecified: Secondary | ICD-10-CM

## 2024-08-04 DIAGNOSIS — Z9889 Other specified postprocedural states: Secondary | ICD-10-CM | POA: Diagnosis not present

## 2024-08-04 DIAGNOSIS — N186 End stage renal disease: Secondary | ICD-10-CM | POA: Diagnosis not present

## 2024-08-09 ENCOUNTER — Encounter (INDEPENDENT_AMBULATORY_CARE_PROVIDER_SITE_OTHER): Payer: Self-pay | Admitting: Nurse Practitioner

## 2024-08-09 NOTE — Progress Notes (Signed)
 Subjective:    Patient ID: NICKHOLAS GOLDSTON, male    DOB: 06-Dec-1956, 68 y.o.   MRN: [id number] Chief Complaint  Patient presents with   Follow-up    fu 6 months + HDA see JD/FB     The patient returns today for follow-up and first look at his right brachiocephalic AV fistula.  The wound is well-healed.  It was created on 04/29/2024 but unfortunately the patient's follow-up has been somewhat delayed.  Appears to have healed well.  He currently has a 1475 medically growing bruit.  He notes that they attempted to access months but were not very successful.    Review of Systems  All other systems reviewed and are negative.      Objective:   Physical Exam Vitals reviewed.  HENT:     Head: Normocephalic.  Cardiovascular:     Rate and Rhythm: Normal rate.  Pulmonary:     Effort: Pulmonary effort is normal.  Neurological:     Mental Status: He is alert and oriented to person, place, and time. Mental status is at baseline.  Psychiatric:        Mood and Affect: Mood normal.        Behavior: Behavior normal.     BP 130/84   Pulse 88   Ht 5' 7 (1.702 m)   Wt 161 lb 6.4 oz (73.2 kg)   BMI 25.28 kg/m   Past Medical History:  Diagnosis Date   Anemia    Cerebral hemorrhage (HCC) 2012   Chronic constipation    Chronic kidney disease    Diabetes mellitus without complication (HCC)    Hemodialysis patient (HCC)    Hyperlipidemia    Hypertension    Schizophrenia (HCC)    Stroke [facility])     Social History   Socioeconomic History   Marital status: Single    Spouse name: Not on file   Number of children: Not on file   Years of education: Not on file   Highest education level: Not on file  Occupational History   Not on file  Tobacco Use   Smoking status: Never   Smokeless tobacco: Never  Vaping Use   Vaping status: Never Used  Substance and Sexual Activity   Alcohol use: No   Drug use: No   Sexual activity: Not Currently    Birth control/protection: None  Other  Topics Concern   Not on file  Social History Narrative    Lives at group home: [facility] with 6 other people.   Social Drivers of Corporate investment banker Strain: Not on file  Food Insecurity: No Food Insecurity (02/26/2024)   Hunger Vital Sign    Worried About Running Out of Food in the Last Year: Never true    Ran Out of Food in the Last Year: Never true  Transportation Needs: No Transportation Needs (02/26/2024)   PRAPARE - Administrator, Civil Service (Medical): No    Lack of Transportation (Non-Medical): No  Physical Activity: Not on file  Stress: Not on file  Social Connections: Unknown (02/26/2024)   Social Connection and Isolation Panel    Frequency of Communication with Friends and Family: More than three times a week    Frequency of Social Gatherings with Friends and Family: Twice a week    Attends Religious Services: 1 to 4 times per year    Active Member of [employer] or Organizations: No    Attends Banker  Meetings: Never    Marital Status: Patient declined  Intimate Partner Violence: Not At Risk (02/26/2024)   Humiliation, Afraid, Rape, and Kick questionnaire    Fear of Current or Ex-Partner: No    Emotionally Abused: No    Physically Abused: No    Sexually Abused: No    Past Surgical History:  Procedure Laterality Date   A/V FISTULAGRAM Left 02/24/2017   Procedure: A/V Fistulagram;  Surgeon: Cordella KANDICE Shawl, MD;  Location: [facility];  Service: Cardiovascular;  Laterality: Left;   A/V FISTULAGRAM Left 09/03/2020   Procedure: A/V FISTULAGRAM;  Surgeon: Marea Selinda RAMAN, MD;  Location: [facility];  Service: Cardiovascular;  Laterality: Left;   A/V FISTULAGRAM Left 06/27/2021   Procedure: A/V FISTULAGRAM;  Surgeon: Marea Selinda RAMAN, MD;  Location: [facility];  Service: Cardiovascular;  Laterality: Left;   A/V FISTULAGRAM Left 07/24/2021   Procedure: A/V FISTULAGRAM;  Surgeon: Marea Selinda RAMAN, MD;  Location: [facility];  Service: Cardiovascular;  Laterality: Left;   A/V FISTULAGRAM Left 03/25/2022   Procedure: A/V Fistulagram;  Surgeon: Tisa Curry LABOR, MD;  Location: [facility];  Service: Cardiovascular;  Laterality: Left;   A/V FISTULAGRAM N/A 06/12/2022   Procedure: A/V Fistulagram;  Surgeon: Shawl Cordella KANDICE, MD;  Location: [facility];  Service: Cardiovascular;  Laterality: N/A;   A/V SHUNT INTERVENTION N/A 02/29/2024   Procedure: A/V SHUNT INTERVENTION;  Surgeon: Marea Selinda RAMAN, MD;  Location: [facility];  Service: Cardiovascular;  Laterality: N/A;   AV FISTULA PLACEMENT Right 04/29/2024   Procedure: ARTERIOVENOUS (AV) FISTULA CREATION;  Surgeon: Shawl Cordella KANDICE, MD;  Location: [facility];  Service: Vascular;  Laterality: Right;  BRACHIALCEPHALIC   DIALYSIS/PERMA CATHETER INSERTION N/A 12/03/2021   Procedure: DIALYSIS/PERMA CATHETER INSERTION;  Surgeon: Marea Selinda RAMAN, MD;  Location: [facility];  Service: Cardiovascular;  Laterality: N/A;   DIALYSIS/PERMA CATHETER INSERTION N/A 03/27/2022   Procedure: DIALYSIS/PERMA CATHETER INSERTION;  Surgeon: Marea Selinda RAMAN, MD;  Location: [facility];  Service: Cardiovascular;  Laterality: N/A;   DIALYSIS/PERMA CATHETER INSERTION N/A 03/09/2024   Procedure: DIALYSIS/PERMA CATHETER INSERTION;  Surgeon: Marea Selinda RAMAN, MD;  Location: [facility];  Service: Cardiovascular;  Laterality: N/A;   DIALYSIS/PERMA CATHETER REMOVAL N/A 03/13/2022   Procedure: DIALYSIS/PERMA CATHETER REMOVAL;  Surgeon: Marea Selinda RAMAN, MD;  Location: [facility];  Service: Cardiovascular;  Laterality: N/A;   INSERTION OF DIALYSIS CATHETER Right 02/27/2024   Procedure: INSERTION OF DIALYSIS CATHETER;  Surgeon: Laurence Redell CROME, MD;  Location: [facility];  Service: Vascular;  Laterality: Right;   LIGATION OF ARTERIOVENOUS  FISTULA Left 04/29/2024   Procedure: LIGATION OF ARTERIOVENOUS  FISTULA;  Surgeon: Shawl Cordella KANDICE, MD;  Location:  [facility];  Service: Vascular;  Laterality: Left;   PERIPHERAL VASCULAR CATHETERIZATION N/A 04/17/2015   Procedure: Dialysis/Perma Catheter Insertion;  Surgeon: Cordella KANDICE Shawl, MD;  Location: [facility];  Service: Cardiovascular;  Laterality: N/A;   PERIPHERAL VASCULAR CATHETERIZATION Left 07/30/2015   Procedure: A/V Shuntogram/Fistulagram;  Surgeon: Selinda RAMAN Marea, MD;  Location: [facility];  Service: Cardiovascular;  Laterality: Left;   PERIPHERAL VASCULAR CATHETERIZATION Left 07/30/2015   Procedure: A/V Shunt Intervention;  Surgeon: Selinda RAMAN Marea, MD;  Location: [facility];  Service: Cardiovascular;  Laterality: Left;   PERIPHERAL VASCULAR CATHETERIZATION N/A 08/20/2015   Procedure: DIALYSIS/PERMA CATHETER REMOVAL;  Surgeon: Selinda RAMAN Marea, MD;  Location: [facility];  Service: Cardiovascular;  Laterality: N/A;   PERIPHERAL VASCULAR THROMBECTOMY Left 03/27/2022   Procedure: PERIPHERAL VASCULAR THROMBECTOMY;  Surgeon: Marea Selinda RAMAN, MD;  Location: [facility];  Service: Cardiovascular;  Laterality: Left;   PERIPHERAL VASCULAR THROMBECTOMY Left 08/04/2022   Procedure: PERIPHERAL VASCULAR THROMBECTOMY;  Surgeon: Marea Selinda RAMAN, MD;  Location: [facility];  Service: Cardiovascular;  Laterality: Left;   PERIPHERAL VASCULAR THROMBECTOMY Left 09/01/2023   Procedure: PERIPHERAL VASCULAR THROMBECTOMY;  Surgeon: Marea Selinda RAMAN, MD;  Location: [facility];  Service: Cardiovascular;  Laterality: Left;   REVISON OF ARTERIOVENOUS FISTULA Left 12/04/2021   Procedure: REVISON OF ARTERIOVENOUS FISTULA;  Surgeon: Marea Selinda RAMAN, MD;  Location: [facility];  Service: Vascular;  Laterality: Left;   TEMPORARY DIALYSIS CATHETER N/A 06/11/2022   Procedure: TEMPORARY DIALYSIS CATHETER;  Surgeon: Jama Cordella MATSU, MD;  Location: [facility];  Service: Cardiovascular;  Laterality: N/A;   THROMBECTOMY W/ EMBOLECTOMY Left 12/04/2021   Procedure: THROMBECTOMY  ARTERIOVENOUS FISTULA;  Surgeon: Marea Selinda RAMAN, MD;  Location: [facility];  Service: Vascular;  Laterality: Left;   VASCULAR SURGERY     Fistula placement    Family History  Problem Relation Age of Onset   Diabetes Mother    Kidney cancer Mother    Diabetes Sister    Stroke Brother    Prostate cancer Neg Hx    Bladder Cancer Neg Hx     Allergies  Allergen Reactions   Nsaids Other (See Comments)    Cannot take due to renal disease       Latest Ref Rng & Units 04/29/2024    7:12 AM 04/18/2024    9:01 AM 03/07/2024    5:12 PM  CBC  WBC 4.0 - 10.5 K/uL  4.8  6.3   Hemoglobin 13.0 - 17.0 g/dL 89.0  9.9  88.6   Hematocrit 39.0 - 52.0 % 32.0  30.6  35.3   Platelets 150 - 400 K/uL  88  140       CMP     Component Value Date/Time   NA 135 04/29/2024 0712   NA 140 03/21/2015 1512   K 3.6 04/29/2024 0712   K 4.0 03/21/2015 1512   CL 96 (L) 04/29/2024 0712   CL 95 (L) 03/21/2015 1512   CO2 26 04/18/2024 0901   CO2 36 (H) 03/21/2015 1512   GLUCOSE 103 (H) 04/29/2024 0712   GLUCOSE 117 (H) 03/21/2015 1512   BUN 23 04/29/2024 0712   BUN 64 (H) 03/21/2015 1512   CREATININE 5.40 (H) 04/29/2024 0712   CREATININE 7.71 (H) 03/21/2015 1512   CALCIUM  9.0 04/18/2024 0901   CALCIUM  8.5 (L) 03/21/2015 1512   PROT 6.8 08/01/2022 2129   PROT 5.8 (L) 12/13/2014 1354   ALBUMIN 3.2 (L) 02/27/2024 1635   ALBUMIN 2.5 (L) 12/13/2014 1354   AST 16 08/01/2022 2129   AST 17 12/13/2014 1354   ALT 14 08/01/2022 2129   ALT 21 12/13/2014 1354   ALKPHOS 149 (H) 08/01/2022 2129   ALKPHOS 50 12/13/2014 1354   BILITOT 0.8 08/01/2022 2129   BILITOT 0.2 12/13/2014 1354   GFRNONAA 5 (L) 04/18/2024 0901   GFRNONAA 7 (L) 03/21/2015 1512     No results found.     Assessment & Plan:   1. ESRD (end stage renal disease) (HCC) (Primary) During his visit we were able to speak directly with his dialysis center.  They noted that  they would attempt his fistula again and if they continue to have issues  they would reach out to contact us  to plan on a fistulogram.  His noninvasive studies do not indicate any significant issues that may preclude its dialysis use.  Barring any issues we will plan on having him return in 3 months with noninvasive studies.  2. Type 2 diabetes mellitus with hyperlipidemia (HCC) Continue hypoglycemic medications as already ordered, these medications have been reviewed and there are no changes at this time.  Hgb A1C to be monitored as already arranged by primary service  3. Essential hypertension Continue antihypertensive medications as already ordered, these medications have been reviewed and there are no changes at this time.   Current Outpatient Medications on File Prior to Visit  Medication Sig Dispense Refill   acetaminophen  (TYLENOL ) 500 MG tablet Take 500 mg by mouth every 4 (four) hours as needed for moderate pain (pain score 4-6).     aspirin  EC 81 MG tablet Take 81 mg by mouth daily.      benztropine  (COGENTIN ) 0.5 MG tablet Take 0.5 mg by mouth 2 (two) times daily.     cetirizine (ZYRTEC) 10 MG tablet Take 10 mg by mouth every other day.     Cholecalciferol  (VITAMIN D3) 125 MCG (5000 UT) CAPS Take 5,000 Units by mouth once a week.     clopidogrel  (PLAVIX ) 75 MG tablet Take 1 tablet (75 mg total) by mouth daily. 30 tablet 0   diphenhydrAMINE  (BENADRYL ) 25 mg capsule Take 25 mg by mouth See admin instructions. Take 1 capsule (25mg ) by mouth nightly as needed for sleep - may take 1 capsule (25mg ) by mouth during the daytime as needed for itching     docusate sodium  (COLACE) 100 MG capsule Take 100 mg by mouth 2 (two) times daily.     feeding supplement (BOOST HIGH PROTEIN) LIQD Take 1 Container by mouth See admin instructions. Take 1 container by mouth three times a week or as needed of Nutritional Supplement     fluticasone  (FLONASE ) 50 MCG/ACT nasal spray Place 2 sprays into both nostrils daily.     haloperidol  (HALDOL ) 10 MG tablet Take 5-10 mg by mouth  See admin instructions. Take 5 mg in the morning and 10 mg at bedtime     HYDROcodone -acetaminophen  (NORCO/VICODIN) 5-325 MG tablet Take 1 tablet by mouth every 6 (six) hours as needed for moderate pain (pain score 4-6) or severe pain (pain score 7-10). 20 tablet 0   mirtazapine  (REMERON ) 15 MG tablet Take 15 mg by mouth at bedtime.     Multiple Vitamin (DAILY-VITE) TABS Take 1 tablet by mouth daily.     niacin  (NIASPAN ) 1000 MG CR tablet Take 1,000 mg by mouth at bedtime.      patiromer  (VELTASSA ) 8.4 g packet Take 8.4 g by mouth daily.     polyethylene glycol (MIRALAX  / GLYCOLAX ) 17 g packet Take 17 g by mouth daily as needed (Constipation). Mix 17 g (1 capful) in to 8 ounces of Juice/Water     pravastatin  (PRAVACHOL ) 40 MG tablet Take 40 mg by mouth at bedtime.      No current facility-administered medications on file prior to visit.    There are no Patient Instructions on file for this visit. No follow-ups on file.   Calleen Alvis E Vian Fluegel, NP

## 2024-08-11 ENCOUNTER — Other Ambulatory Visit

## 2024-08-11 DIAGNOSIS — R972 Elevated prostate specific antigen [PSA]: Secondary | ICD-10-CM

## 2024-08-12 ENCOUNTER — Encounter: Payer: Self-pay | Admitting: *Deleted

## 2024-08-12 ENCOUNTER — Ambulatory Visit: Payer: Self-pay | Admitting: Urology

## 2024-08-12 DIAGNOSIS — R972 Elevated prostate specific antigen [PSA]: Secondary | ICD-10-CM

## 2024-08-12 LAB — PSA: Prostate Specific Ag, Serum: 17 ng/mL — ABNORMAL HIGH (ref 0.0–4.0)

## 2024-09-01 ENCOUNTER — Ambulatory Visit
Admission: RE | Admit: 2024-09-01 | Discharge: 2024-09-01 | Disposition: A | Discharge status: Home / Self Care | Source: Ambulatory Visit | Attending: Urology | Admitting: Urology

## 2024-09-01 DIAGNOSIS — R972 Elevated prostate specific antigen [PSA]: Secondary | ICD-10-CM | POA: Diagnosis present

## 2024-09-01 MED ORDER — GADOBUTROL 1 MMOL/ML IV SOLN
7.5000 mL | Freq: Once | INTRAVENOUS | Status: AC | PRN
  Administered 2024-09-01: 7.5 mL via INTRAVENOUS

## 2024-09-03 ENCOUNTER — Ambulatory Visit: Payer: Self-pay | Admitting: Urology

## 2024-10-10 ENCOUNTER — Telehealth (INDEPENDENT_AMBULATORY_CARE_PROVIDER_SITE_OTHER): Payer: Self-pay

## 2024-10-10 NOTE — Telephone Encounter (Signed)
 Spoke with the patient's caregiver and he is scheduled with Dr. Jama for a left arm fistulagram on 10/12/24 with a 1:30 pm arrival time to the [facility]. Pre-procedure instructions were discussed and the caregiver stated she wrote them down.

## 2024-10-12 ENCOUNTER — Encounter
Admission: RE | Disposition: A | Discharge status: Home / Self Care | Payer: Self-pay | Source: Home / Self Care | Attending: Vascular Surgery

## 2024-10-12 ENCOUNTER — Encounter: Payer: Self-pay | Admitting: Vascular Surgery

## 2024-10-12 ENCOUNTER — Ambulatory Visit
Admission: RE | Admit: 2024-10-12 | Discharge: 2024-10-12 | Disposition: A | Discharge status: Home / Self Care | Attending: Vascular Surgery | Admitting: Vascular Surgery

## 2024-10-12 DIAGNOSIS — E1169 Type 2 diabetes mellitus with other specified complication: Secondary | ICD-10-CM | POA: Insufficient documentation

## 2024-10-12 DIAGNOSIS — T82898A Other specified complication of vascular prosthetic devices, implants and grafts, initial encounter: Secondary | ICD-10-CM | POA: Diagnosis present

## 2024-10-12 DIAGNOSIS — E1122 Type 2 diabetes mellitus with diabetic chronic kidney disease: Secondary | ICD-10-CM | POA: Diagnosis not present

## 2024-10-12 DIAGNOSIS — I871 Compression of vein: Secondary | ICD-10-CM

## 2024-10-12 DIAGNOSIS — Z992 Dependence on renal dialysis: Secondary | ICD-10-CM | POA: Diagnosis not present

## 2024-10-12 DIAGNOSIS — E785 Hyperlipidemia, unspecified: Secondary | ICD-10-CM | POA: Diagnosis not present

## 2024-10-12 DIAGNOSIS — I120 Hypertensive chronic kidney disease with stage 5 chronic kidney disease or end stage renal disease: Secondary | ICD-10-CM | POA: Diagnosis not present

## 2024-10-12 DIAGNOSIS — Y832 Surgical operation with anastomosis, bypass or graft as the cause of abnormal reaction of the patient, or of later complication, without mention of misadventure at the time of the procedure: Secondary | ICD-10-CM | POA: Diagnosis not present

## 2024-10-12 DIAGNOSIS — N186 End stage renal disease: Secondary | ICD-10-CM | POA: Diagnosis not present

## 2024-10-12 HISTORY — PX: A/V FISTULAGRAM: CATH118298

## 2024-10-12 LAB — GLUCOSE, CAPILLARY
Glucose-Capillary: 72 mg/dL (ref 70–99)
Glucose-Capillary: 102 mg/dL — ABNORMAL HIGH (ref 70–99)
Glucose-Capillary: 70 mg/dL (ref 70–99)
Glucose-Capillary: 99 mg/dL (ref 70–99)

## 2024-10-12 LAB — POTASSIUM (ARMC VASCULAR LAB ONLY): Potassium (ARMC vascular lab): 5.4 mmol/L — ABNORMAL HIGH (ref 3.5–5.1)

## 2024-10-12 SURGERY — A/V FISTULAGRAM
Anesthesia: Moderate Sedation | Laterality: Right

## 2024-10-12 MED ORDER — MIDAZOLAM HCL 2 MG/2ML IJ SOLN
INTRAMUSCULAR | Status: AC
  Filled 2024-10-12: qty 2

## 2024-10-12 MED ORDER — IODIXANOL 320 MG/ML IV SOLN
INTRAVENOUS | Status: DC | PRN
Start: 2024-10-12 — End: 2024-10-12
  Administered 2024-10-12: 15 mL

## 2024-10-12 MED ORDER — HEPARIN SODIUM (PORCINE) 1000 UNIT/ML IJ SOLN
INTRAMUSCULAR | Status: AC
  Filled 2024-10-12: qty 10

## 2024-10-12 MED ORDER — DEXTROSE 50 % IV SOLN
25.0000 mL | Freq: Once | INTRAVENOUS | Status: AC
  Administered 2024-10-12: 25 mL via INTRAVENOUS

## 2024-10-12 MED ORDER — MIDAZOLAM HCL (PF) 2 MG/2ML IJ SOLN
INTRAMUSCULAR | Status: DC | PRN
  Administered 2024-10-12 (×2): 1 mg via INTRAVENOUS
  Administered 2024-10-12: 2 mg via INTRAVENOUS

## 2024-10-12 MED ORDER — ONDANSETRON HCL 4 MG/2ML IJ SOLN: 4.0000 mg | Freq: Four times a day (QID) | INTRAMUSCULAR | Status: DC | PRN

## 2024-10-12 MED ORDER — FENTANYL CITRATE (PF) 50 MCG/ML IJ SOSY
PREFILLED_SYRINGE | INTRAMUSCULAR | Status: AC
  Filled 2024-10-12: qty 1

## 2024-10-12 MED ORDER — METHYLPREDNISOLONE SODIUM SUCC 125 MG IJ SOLR: 125.0000 mg | Freq: Once | INTRAMUSCULAR | Status: DC | PRN

## 2024-10-12 MED ORDER — MIDAZOLAM HCL 2 MG/ML PO SYRP: 8.0000 mg | ORAL_SOLUTION | Freq: Once | ORAL | Status: DC | PRN

## 2024-10-12 MED ORDER — HYDROMORPHONE HCL 1 MG/ML IJ SOLN: 1.0000 mg | Freq: Once | INTRAMUSCULAR | Status: DC | PRN

## 2024-10-12 MED ORDER — CEFAZOLIN SODIUM-DEXTROSE 1-4 GM/50ML-% IV SOLN
1.0000 g | INTRAVENOUS | Status: AC
  Administered 2024-10-12: 1 g via INTRAVENOUS

## 2024-10-12 MED ORDER — HEPARIN (PORCINE) IN NACL 1000-0.9 UT/500ML-% IV SOLN
INTRAVENOUS | Status: DC | PRN
Start: 2024-10-12 — End: 2024-10-12
  Administered 2024-10-12: 500 mL

## 2024-10-12 MED ORDER — SODIUM CHLORIDE 0.9 % IV SOLN: INTRAVENOUS | Status: DC

## 2024-10-12 MED ORDER — FENTANYL CITRATE (PF) 100 MCG/2ML IJ SOLN
INTRAMUSCULAR | Status: DC | PRN
  Administered 2024-10-12: 25 ug via INTRAVENOUS
  Administered 2024-10-12 (×2): 50 ug via INTRAVENOUS

## 2024-10-12 MED ORDER — DEXTROSE 50 % IV SOLN
INTRAVENOUS | Status: AC
  Filled 2024-10-12: qty 50

## 2024-10-12 MED ORDER — MIDAZOLAM HCL 2 MG/2ML IJ SOLN
INTRAMUSCULAR | Status: AC
Start: 2024-10-12 — End: 2024-10-12
  Filled 2024-10-12: qty 4

## 2024-10-12 MED ORDER — DIPHENHYDRAMINE HCL 50 MG/ML IJ SOLN: 50.0000 mg | Freq: Once | INTRAMUSCULAR | Status: DC | PRN

## 2024-10-12 MED ORDER — FAMOTIDINE 20 MG PO TABS: 40.0000 mg | ORAL_TABLET | Freq: Once | ORAL | Status: DC | PRN

## 2024-10-12 MED ORDER — FENTANYL CITRATE (PF) 100 MCG/2ML IJ SOLN
INTRAMUSCULAR | Status: AC
  Filled 2024-10-12: qty 2

## 2024-10-12 MED ORDER — CEFAZOLIN SODIUM-DEXTROSE 1-4 GM/50ML-% IV SOLN
INTRAVENOUS | Status: AC
  Filled 2024-10-12: qty 50

## 2024-10-12 MED ORDER — LIDOCAINE HCL (PF) 1 % IJ SOLN
INTRAMUSCULAR | Status: DC | PRN
  Administered 2024-10-12: 5 mL

## 2024-10-12 SURGICAL SUPPLY — 14 items
CATH BEACON 5 .035 65 KMP TIP (CATHETERS) IMPLANT
CATH SEEKER .035X90 (CATHETERS) IMPLANT
COVER PROBE ULTRASOUND 5X96 (MISCELLANEOUS) IMPLANT
DRAPE BRACHIAL (DRAPES) IMPLANT
GLIDEWIRE ADV .035X180CM (WIRE) IMPLANT
GLIDEWIRE STIFF .35X180X3 HYDR (WIRE) IMPLANT
GOWN STRL REUS W/ TWL LRG LVL3 (GOWN DISPOSABLE) ×1 IMPLANT
NDL ENTRY 21GA 7CM ECHOTIP (NEEDLE) IMPLANT
NEEDLE ENTRY 21GA 7CM ECHOTIP (NEEDLE) ×1 IMPLANT
PACK ANGIOGRAPHY (CUSTOM PROCEDURE TRAY) ×1 IMPLANT
SET INTRO CAPELLA COAXIAL (SET/KITS/TRAYS/PACK) IMPLANT
SHEATH BRITE TIP 6FRX5.5 (SHEATH) IMPLANT
SUT MNCRL AB 4-0 PS2 18 (SUTURE) IMPLANT
WIRE G 018X200 V18 (WIRE) IMPLANT

## 2024-10-12 NOTE — Progress Notes (Signed)
 MRN : [id number]  Lawrence [name] is a 68 y.o. (Mar 29, 1956) male who presents with chief complaint of check access.  History of Present Illness:   Patient presents to [facility] today secondary to increasing problems with his access and therefore he has been scheduled for evaluation and potential intervention.  The patient's dialysis center is reporting a significant increase in problems with dialysis.  It is reported that adequate dialysis is not being achieved.    The patient denies hand pain or other symptoms consistent with steal phenomena.  No significant arm swelling.  The patient denies redness or swelling at the access site. The patient denies fever or chills at home or while on dialysis.  No recent shortening of the patient's walking distance or new symptoms consistent with claudication.  No history of rest pain symptoms. No new ulcers or wounds of the lower extremities have occurred.  The patient denies amaurosis fugax or recent TIA symptoms. There are no recent neurological changes noted. There is no history of DVT, PE or superficial thrombophlebitis. No recent episodes of angina or shortness of breath documented.   Current Meds  Medication Sig   aspirin  EC 81 MG tablet Take 81 mg by mouth daily.    benztropine  (COGENTIN ) 0.5 MG tablet Take 0.5 mg by mouth 2 (two) times daily.   clopidogrel  (PLAVIX ) 75 MG tablet Take 1 tablet (75 mg total) by mouth daily.   docusate sodium  (COLACE) 100 MG capsule Take 100 mg by mouth 2 (two) times daily.   fluticasone  (FLONASE ) 50 MCG/ACT nasal spray Place 2 sprays into both nostrils daily.   haloperidol  (HALDOL ) 10 MG tablet Take 5-10 mg by mouth See admin instructions. Take 5 mg in the morning and 10 mg at bedtime    Past Medical History:  Diagnosis Date   Anemia    Cerebral hemorrhage (HCC) 2012   Chronic constipation    Chronic kidney disease    Diabetes mellitus without complication  (HCC)    Hemodialysis patient    Hyperlipidemia    Hypertension    Schizophrenia (HCC)    Stroke [facility])     Past Surgical History:  Procedure Laterality Date   A/V FISTULAGRAM Left 02/24/2017   Procedure: A/V Fistulagram;  Surgeon: Cordella KANDICE Shawl, MD;  Location: [facility];  Service: Cardiovascular;  Laterality: Left;   A/V FISTULAGRAM Left 09/03/2020   Procedure: A/V FISTULAGRAM;  Surgeon: Marea Selinda RAMAN, MD;  Location: [facility];  Service: Cardiovascular;  Laterality: Left;   A/V FISTULAGRAM Left 06/27/2021   Procedure: A/V FISTULAGRAM;  Surgeon: Marea Selinda RAMAN, MD;  Location: [facility];  Service: Cardiovascular;  Laterality: Left;   A/V FISTULAGRAM Left 07/24/2021   Procedure: A/V FISTULAGRAM;  Surgeon: Marea Selinda RAMAN, MD;  Location: [facility];  Service: Cardiovascular;  Laterality: Left;   A/V FISTULAGRAM Left 03/25/2022   Procedure: A/V Fistulagram;  Surgeon: Tisa Curry DELENA, MD;  Location: [facility];  Service: Cardiovascular;  Laterality: Left;   A/V FISTULAGRAM N/A 06/12/2022   Procedure: A/V Fistulagram;  Surgeon: Shawl Cordella KANDICE, MD;  Location: [facility];  Service: Cardiovascular;  Laterality: N/A;   A/V SHUNT INTERVENTION N/A 02/29/2024   Procedure: A/V SHUNT INTERVENTION;  Surgeon: Marea Selinda RAMAN, MD;  Location: [facility];  Service: Cardiovascular;  Laterality: N/A;  AV FISTULA PLACEMENT Right 04/29/2024   Procedure: ARTERIOVENOUS (AV) FISTULA CREATION;  Surgeon: Jama Cordella MATSU, MD;  Location: [facility];  Service: Vascular;  Laterality: Right;  BRACHIALCEPHALIC   DIALYSIS/PERMA CATHETER INSERTION N/A 12/03/2021   Procedure: DIALYSIS/PERMA CATHETER INSERTION;  Surgeon: Marea Selinda RAMAN, MD;  Location: [facility];  Service: Cardiovascular;  Laterality: N/A;   DIALYSIS/PERMA CATHETER INSERTION N/A 03/27/2022   Procedure: DIALYSIS/PERMA CATHETER INSERTION;  Surgeon: Marea Selinda RAMAN, MD;  Location: [facility];  Service: Cardiovascular;  Laterality: N/A;   DIALYSIS/PERMA CATHETER INSERTION N/A 03/09/2024   Procedure: DIALYSIS/PERMA CATHETER INSERTION;  Surgeon: Marea Selinda RAMAN, MD;  Location: [facility];  Service: Cardiovascular;  Laterality: N/A;   DIALYSIS/PERMA CATHETER REMOVAL N/A 03/13/2022   Procedure: DIALYSIS/PERMA CATHETER REMOVAL;  Surgeon: Marea Selinda RAMAN, MD;  Location: [facility];  Service: Cardiovascular;  Laterality: N/A;   INSERTION OF DIALYSIS CATHETER Right 02/27/2024   Procedure: INSERTION OF DIALYSIS CATHETER;  Surgeon: Laurence Redell CROME, MD;  Location: [facility];  Service: Vascular;  Laterality: Right;   LIGATION OF ARTERIOVENOUS  FISTULA Left 04/29/2024   Procedure: LIGATION OF ARTERIOVENOUS  FISTULA;  Surgeon: Jama Cordella MATSU, MD;  Location: [facility];  Service: Vascular;  Laterality: Left;   PERIPHERAL VASCULAR CATHETERIZATION N/A 04/17/2015   Procedure: Dialysis/Perma Catheter Insertion;  Surgeon: Cordella MATSU Jama, MD;  Location: [facility];  Service: Cardiovascular;  Laterality: N/A;   PERIPHERAL VASCULAR CATHETERIZATION Left 07/30/2015   Procedure: A/V Shuntogram/Fistulagram;  Surgeon: Selinda RAMAN Marea, MD;  Location: [facility];  Service: Cardiovascular;  Laterality: Left;   PERIPHERAL VASCULAR CATHETERIZATION Left 07/30/2015   Procedure: A/V Shunt Intervention;  Surgeon: Selinda RAMAN Marea, MD;  Location: [facility];  Service: Cardiovascular;  Laterality: Left;   PERIPHERAL VASCULAR CATHETERIZATION N/A 08/20/2015   Procedure: DIALYSIS/PERMA CATHETER REMOVAL;  Surgeon: Selinda RAMAN Marea, MD;  Location: [facility];  Service: Cardiovascular;  Laterality: N/A;   PERIPHERAL VASCULAR THROMBECTOMY Left 03/27/2022   Procedure: PERIPHERAL VASCULAR THROMBECTOMY;  Surgeon: Marea Selinda RAMAN, MD;  Location: [facility];  Service: Cardiovascular;  Laterality: Left;   PERIPHERAL VASCULAR THROMBECTOMY Left 08/04/2022   Procedure: PERIPHERAL VASCULAR  THROMBECTOMY;  Surgeon: Marea Selinda RAMAN, MD;  Location: [facility];  Service: Cardiovascular;  Laterality: Left;   PERIPHERAL VASCULAR THROMBECTOMY Left 09/01/2023   Procedure: PERIPHERAL VASCULAR THROMBECTOMY;  Surgeon: Marea Selinda RAMAN, MD;  Location: [facility];  Service: Cardiovascular;  Laterality: Left;   REVISON OF ARTERIOVENOUS FISTULA Left 12/04/2021   Procedure: REVISON OF ARTERIOVENOUS FISTULA;  Surgeon: Marea Selinda RAMAN, MD;  Location: [facility];  Service: Vascular;  Laterality: Left;   TEMPORARY DIALYSIS CATHETER N/A 06/11/2022   Procedure: TEMPORARY DIALYSIS CATHETER;  Surgeon: Jama Cordella MATSU, MD;  Location: [facility];  Service: Cardiovascular;  Laterality: N/A;   THROMBECTOMY W/ EMBOLECTOMY Left 12/04/2021   Procedure: THROMBECTOMY ARTERIOVENOUS FISTULA;  Surgeon: Marea Selinda RAMAN, MD;  Location: [facility];  Service: Vascular;  Laterality: Left;   VASCULAR SURGERY     Fistula placement    Social History Social History   Tobacco Use   Smoking status: Never   Smokeless tobacco: Never  Vaping Use   Vaping status: Never Used  Substance Use Topics   Alcohol use: No   Drug use: No    Family History Family History  Problem Relation Age of Onset   Diabetes Mother  Kidney cancer Mother    Diabetes Sister    Stroke Brother    Prostate cancer Neg Hx    Bladder Cancer Neg Hx     Allergies  Allergen Reactions   Nsaids Other (See Comments)    Cannot take due to renal disease     REVIEW OF SYSTEMS (Negative unless checked)  Constitutional: [] Weight loss  [] Fever  [] Chills Cardiac: [] Chest pain   [] Chest pressure   [] Palpitations   [] Shortness of breath when laying flat   [] Shortness of breath with exertion. Vascular:  [] Pain in legs with walking   [] Pain in legs at rest  [] History of DVT   [] Phlebitis   [] Swelling in legs   [] Varicose veins   [] Non-healing ulcers Pulmonary:   [] Uses home oxygen   [] Productive cough   [] Hemoptysis   [] Wheeze  [] COPD    [] Asthma Neurologic:  [] Dizziness   [] Seizures   [] History of stroke   [] History of TIA  [] Aphasia   [] Vissual changes   [] Weakness or numbness in arm   [] Weakness or numbness in leg Musculoskeletal:   [] Joint swelling   [] Joint pain   [] Low back pain Hematologic:  [] Easy bruising  [] Easy bleeding   [] Hypercoagulable state   [] Anemic Gastrointestinal:  [] Diarrhea   [] Vomiting  [] Gastroesophageal reflux/heartburn   [] Difficulty swallowing. Genitourinary:  [x] Chronic kidney disease   [] Difficult urination  [] Frequent urination   [] Blood in urine Skin:  [] Rashes   [] Ulcers  Psychological:  [] History of anxiety   []  History of major depression.  Physical Examination  Vitals:   10/12/24 1409  BP: (!) 146/75  Pulse: 89  Resp: 18  Temp: (!) 97.3 F (36.3 C)  TempSrc: Temporal  SpO2: 100%  Weight: 73 kg  Height: 5' 7 (1.702 m)   Body mass index is 25.22 kg/m. Gen: WD/WN, NAD Head: Hardy/AT, No temporalis wasting.  Ear/Nose/Throat: Hearing grossly intact, nares w/o erythema or drainage Eyes: PER, EOMI, sclera nonicteric.  Neck: Supple, no gross masses or lesions.  No JVD.  Pulmonary:  Good air movement, no audible wheezing, no use of accessory muscles.  Cardiac: RRR, precordium non-hyperdynamic. Vascular:   Right brachiocephalic fistula with a weak thrill soft bruit.  Left arm previous AV graft no thrill no bruit Vessel Right Left  Radial Palpable Palpable  Brachial Palpable Palpable  Gastrointestinal: soft, non-distended. No guarding/no peritoneal signs.  Musculoskeletal: M/S 5/5 throughout.  No deformity.  Neurologic: CN 2-12 intact. Pain and light touch intact in extremities.  Symmetrical.  Speech is fluent. Motor exam as listed above. Psychiatric: Judgment intact, Mood & affect appropriate for pt's clinical situation. Dermatologic: No rashes or ulcers noted.  No changes consistent with cellulitis.   CBC Lab Results  Component Value Date   WBC 4.8 04/18/2024   HGB 10.9 (L)  04/29/2024   HCT 32.0 (L) 04/29/2024   MCV 99.7 04/18/2024   PLT 88 (L) 04/18/2024    BMET    Component Value Date/Time   NA 135 04/29/2024 0712   NA 140 03/21/2015 1512   K 3.6 04/29/2024 0712   K 4.0 03/21/2015 1512   CL 96 (L) 04/29/2024 0712   CL 95 (L) 03/21/2015 1512   CO2 26 04/18/2024 0901   CO2 36 (H) 03/21/2015 1512   GLUCOSE 103 (H) 04/29/2024 0712   GLUCOSE 117 (H) 03/21/2015 1512   BUN 23 04/29/2024 0712   BUN 64 (H) 03/21/2015 1512   CREATININE 5.40 (H) 04/29/2024 0712   CREATININE 7.71 (H) 03/21/2015 1512  CALCIUM  9.0 04/18/2024 0901   CALCIUM  8.5 (L) 03/21/2015 1512   GFRNONAA 5 (L) 04/18/2024 0901   GFRNONAA 7 (L) 03/21/2015 1512   GFRAA 21 (L) 03/08/2020 1756   GFRAA 8 (L) 03/21/2015 1512   CrCl cannot be calculated (Patient's most recent lab result is older than the maximum 21 days allowed.).  COAG Lab Results  Component Value Date   INR 1.1 02/26/2024   INR 1.1 06/10/2022   INR 1.1 12/13/2014    Radiology No results found.   Assessment/Plan 1. ESRD (end stage renal disease) (HCC) (Primary) Recommend:  The patient is experiencing increasing problems with their dialysis access.  Patient should have a fistulagram with the intention for intervention.  The intention for intervention is to restore appropriate flow and prevent thrombosis and possible loss of the access.  As well as improve the quality of dialysis therapy.  The risks, benefits and alternative therapies were reviewed in detail with the patient.  All questions were answered.  The patient agrees to proceed with angio/intervention.    The patient will follow up with me in the office after the procedure.    2. Type 2 diabetes mellitus with hyperlipidemia (HCC) Continue hypoglycemic medications as already ordered, these medications have been reviewed and there are no changes at this time.   Hgb A1C to be monitored as already arranged by primary service   3. Essential  hypertension Continue antihypertensive medications as already ordered, these medications have been reviewed and there are no changes at this time.   Cordella Shawl, MD  10/12/2024 3:34 PM

## 2024-10-12 NOTE — H&P (View-Only) (Signed)
 MRN : [id number]  Lawrence [name] is a 68 y.o. (Mar 29, 1956) male who presents with chief complaint of check access.  History of Present Illness:   Patient presents to [facility] today secondary to increasing problems with his access and therefore he has been scheduled for evaluation and potential intervention.  The patient's dialysis center is reporting a significant increase in problems with dialysis.  It is reported that adequate dialysis is not being achieved.    The patient denies hand pain or other symptoms consistent with steal phenomena.  No significant arm swelling.  The patient denies redness or swelling at the access site. The patient denies fever or chills at home or while on dialysis.  No recent shortening of the patient's walking distance or new symptoms consistent with claudication.  No history of rest pain symptoms. No new ulcers or wounds of the lower extremities have occurred.  The patient denies amaurosis fugax or recent TIA symptoms. There are no recent neurological changes noted. There is no history of DVT, PE or superficial thrombophlebitis. No recent episodes of angina or shortness of breath documented.   Current Meds  Medication Sig   aspirin  EC 81 MG tablet Take 81 mg by mouth daily.    benztropine  (COGENTIN ) 0.5 MG tablet Take 0.5 mg by mouth 2 (two) times daily.   clopidogrel  (PLAVIX ) 75 MG tablet Take 1 tablet (75 mg total) by mouth daily.   docusate sodium  (COLACE) 100 MG capsule Take 100 mg by mouth 2 (two) times daily.   fluticasone  (FLONASE ) 50 MCG/ACT nasal spray Place 2 sprays into both nostrils daily.   haloperidol  (HALDOL ) 10 MG tablet Take 5-10 mg by mouth See admin instructions. Take 5 mg in the morning and 10 mg at bedtime    Past Medical History:  Diagnosis Date   Anemia    Cerebral hemorrhage (HCC) 2012   Chronic constipation    Chronic kidney disease    Diabetes mellitus without complication  (HCC)    Hemodialysis patient    Hyperlipidemia    Hypertension    Schizophrenia (HCC)    Stroke [facility])     Past Surgical History:  Procedure Laterality Date   A/V FISTULAGRAM Left 02/24/2017   Procedure: A/V Fistulagram;  Surgeon: Cordella KANDICE Shawl, MD;  Location: [facility];  Service: Cardiovascular;  Laterality: Left;   A/V FISTULAGRAM Left 09/03/2020   Procedure: A/V FISTULAGRAM;  Surgeon: Marea Selinda RAMAN, MD;  Location: [facility];  Service: Cardiovascular;  Laterality: Left;   A/V FISTULAGRAM Left 06/27/2021   Procedure: A/V FISTULAGRAM;  Surgeon: Marea Selinda RAMAN, MD;  Location: [facility];  Service: Cardiovascular;  Laterality: Left;   A/V FISTULAGRAM Left 07/24/2021   Procedure: A/V FISTULAGRAM;  Surgeon: Marea Selinda RAMAN, MD;  Location: [facility];  Service: Cardiovascular;  Laterality: Left;   A/V FISTULAGRAM Left 03/25/2022   Procedure: A/V Fistulagram;  Surgeon: Tisa Curry DELENA, MD;  Location: [facility];  Service: Cardiovascular;  Laterality: Left;   A/V FISTULAGRAM N/A 06/12/2022   Procedure: A/V Fistulagram;  Surgeon: Shawl Cordella KANDICE, MD;  Location: [facility];  Service: Cardiovascular;  Laterality: N/A;   A/V SHUNT INTERVENTION N/A 02/29/2024   Procedure: A/V SHUNT INTERVENTION;  Surgeon: Marea Selinda RAMAN, MD;  Location: [facility];  Service: Cardiovascular;  Laterality: N/A;  AV FISTULA PLACEMENT Right 04/29/2024   Procedure: ARTERIOVENOUS (AV) FISTULA CREATION;  Surgeon: Jama Cordella MATSU, MD;  Location: [facility];  Service: Vascular;  Laterality: Right;  BRACHIALCEPHALIC   DIALYSIS/PERMA CATHETER INSERTION N/A 12/03/2021   Procedure: DIALYSIS/PERMA CATHETER INSERTION;  Surgeon: Marea Selinda RAMAN, MD;  Location: [facility];  Service: Cardiovascular;  Laterality: N/A;   DIALYSIS/PERMA CATHETER INSERTION N/A 03/27/2022   Procedure: DIALYSIS/PERMA CATHETER INSERTION;  Surgeon: Marea Selinda RAMAN, MD;  Location: [facility];  Service: Cardiovascular;  Laterality: N/A;   DIALYSIS/PERMA CATHETER INSERTION N/A 03/09/2024   Procedure: DIALYSIS/PERMA CATHETER INSERTION;  Surgeon: Marea Selinda RAMAN, MD;  Location: [facility];  Service: Cardiovascular;  Laterality: N/A;   DIALYSIS/PERMA CATHETER REMOVAL N/A 03/13/2022   Procedure: DIALYSIS/PERMA CATHETER REMOVAL;  Surgeon: Marea Selinda RAMAN, MD;  Location: [facility];  Service: Cardiovascular;  Laterality: N/A;   INSERTION OF DIALYSIS CATHETER Right 02/27/2024   Procedure: INSERTION OF DIALYSIS CATHETER;  Surgeon: Laurence Redell CROME, MD;  Location: [facility];  Service: Vascular;  Laterality: Right;   LIGATION OF ARTERIOVENOUS  FISTULA Left 04/29/2024   Procedure: LIGATION OF ARTERIOVENOUS  FISTULA;  Surgeon: Jama Cordella MATSU, MD;  Location: [facility];  Service: Vascular;  Laterality: Left;   PERIPHERAL VASCULAR CATHETERIZATION N/A 04/17/2015   Procedure: Dialysis/Perma Catheter Insertion;  Surgeon: Cordella MATSU Jama, MD;  Location: [facility];  Service: Cardiovascular;  Laterality: N/A;   PERIPHERAL VASCULAR CATHETERIZATION Left 07/30/2015   Procedure: A/V Shuntogram/Fistulagram;  Surgeon: Selinda RAMAN Marea, MD;  Location: [facility];  Service: Cardiovascular;  Laterality: Left;   PERIPHERAL VASCULAR CATHETERIZATION Left 07/30/2015   Procedure: A/V Shunt Intervention;  Surgeon: Selinda RAMAN Marea, MD;  Location: [facility];  Service: Cardiovascular;  Laterality: Left;   PERIPHERAL VASCULAR CATHETERIZATION N/A 08/20/2015   Procedure: DIALYSIS/PERMA CATHETER REMOVAL;  Surgeon: Selinda RAMAN Marea, MD;  Location: [facility];  Service: Cardiovascular;  Laterality: N/A;   PERIPHERAL VASCULAR THROMBECTOMY Left 03/27/2022   Procedure: PERIPHERAL VASCULAR THROMBECTOMY;  Surgeon: Marea Selinda RAMAN, MD;  Location: [facility];  Service: Cardiovascular;  Laterality: Left;   PERIPHERAL VASCULAR THROMBECTOMY Left 08/04/2022   Procedure: PERIPHERAL VASCULAR  THROMBECTOMY;  Surgeon: Marea Selinda RAMAN, MD;  Location: [facility];  Service: Cardiovascular;  Laterality: Left;   PERIPHERAL VASCULAR THROMBECTOMY Left 09/01/2023   Procedure: PERIPHERAL VASCULAR THROMBECTOMY;  Surgeon: Marea Selinda RAMAN, MD;  Location: [facility];  Service: Cardiovascular;  Laterality: Left;   REVISON OF ARTERIOVENOUS FISTULA Left 12/04/2021   Procedure: REVISON OF ARTERIOVENOUS FISTULA;  Surgeon: Marea Selinda RAMAN, MD;  Location: [facility];  Service: Vascular;  Laterality: Left;   TEMPORARY DIALYSIS CATHETER N/A 06/11/2022   Procedure: TEMPORARY DIALYSIS CATHETER;  Surgeon: Jama Cordella MATSU, MD;  Location: [facility];  Service: Cardiovascular;  Laterality: N/A;   THROMBECTOMY W/ EMBOLECTOMY Left 12/04/2021   Procedure: THROMBECTOMY ARTERIOVENOUS FISTULA;  Surgeon: Marea Selinda RAMAN, MD;  Location: [facility];  Service: Vascular;  Laterality: Left;   VASCULAR SURGERY     Fistula placement    Social History Social History   Tobacco Use   Smoking status: Never   Smokeless tobacco: Never  Vaping Use   Vaping status: Never Used  Substance Use Topics   Alcohol use: No   Drug use: No    Family History Family History  Problem Relation Age of Onset   Diabetes Mother  Kidney cancer Mother    Diabetes Sister    Stroke Brother    Prostate cancer Neg Hx    Bladder Cancer Neg Hx     Allergies  Allergen Reactions   Nsaids Other (See Comments)    Cannot take due to renal disease     REVIEW OF SYSTEMS (Negative unless checked)  Constitutional: [] Weight loss  [] Fever  [] Chills Cardiac: [] Chest pain   [] Chest pressure   [] Palpitations   [] Shortness of breath when laying flat   [] Shortness of breath with exertion. Vascular:  [] Pain in legs with walking   [] Pain in legs at rest  [] History of DVT   [] Phlebitis   [] Swelling in legs   [] Varicose veins   [] Non-healing ulcers Pulmonary:   [] Uses home oxygen   [] Productive cough   [] Hemoptysis   [] Wheeze  [] COPD    [] Asthma Neurologic:  [] Dizziness   [] Seizures   [] History of stroke   [] History of TIA  [] Aphasia   [] Vissual changes   [] Weakness or numbness in arm   [] Weakness or numbness in leg Musculoskeletal:   [] Joint swelling   [] Joint pain   [] Low back pain Hematologic:  [] Easy bruising  [] Easy bleeding   [] Hypercoagulable state   [] Anemic Gastrointestinal:  [] Diarrhea   [] Vomiting  [] Gastroesophageal reflux/heartburn   [] Difficulty swallowing. Genitourinary:  [x] Chronic kidney disease   [] Difficult urination  [] Frequent urination   [] Blood in urine Skin:  [] Rashes   [] Ulcers  Psychological:  [] History of anxiety   []  History of major depression.  Physical Examination  Vitals:   10/12/24 1409  BP: (!) 146/75  Pulse: 89  Resp: 18  Temp: (!) 97.3 F (36.3 C)  TempSrc: Temporal  SpO2: 100%  Weight: 73 kg  Height: 5' 7 (1.702 m)   Body mass index is 25.22 kg/m. Gen: WD/WN, NAD Head: Hardy/AT, No temporalis wasting.  Ear/Nose/Throat: Hearing grossly intact, nares w/o erythema or drainage Eyes: PER, EOMI, sclera nonicteric.  Neck: Supple, no gross masses or lesions.  No JVD.  Pulmonary:  Good air movement, no audible wheezing, no use of accessory muscles.  Cardiac: RRR, precordium non-hyperdynamic. Vascular:   Right brachiocephalic fistula with a weak thrill soft bruit.  Left arm previous AV graft no thrill no bruit Vessel Right Left  Radial Palpable Palpable  Brachial Palpable Palpable  Gastrointestinal: soft, non-distended. No guarding/no peritoneal signs.  Musculoskeletal: M/S 5/5 throughout.  No deformity.  Neurologic: CN 2-12 intact. Pain and light touch intact in extremities.  Symmetrical.  Speech is fluent. Motor exam as listed above. Psychiatric: Judgment intact, Mood & affect appropriate for pt's clinical situation. Dermatologic: No rashes or ulcers noted.  No changes consistent with cellulitis.   CBC Lab Results  Component Value Date   WBC 4.8 04/18/2024   HGB 10.9 (L)  04/29/2024   HCT 32.0 (L) 04/29/2024   MCV 99.7 04/18/2024   PLT 88 (L) 04/18/2024    BMET    Component Value Date/Time   NA 135 04/29/2024 0712   NA 140 03/21/2015 1512   K 3.6 04/29/2024 0712   K 4.0 03/21/2015 1512   CL 96 (L) 04/29/2024 0712   CL 95 (L) 03/21/2015 1512   CO2 26 04/18/2024 0901   CO2 36 (H) 03/21/2015 1512   GLUCOSE 103 (H) 04/29/2024 0712   GLUCOSE 117 (H) 03/21/2015 1512   BUN 23 04/29/2024 0712   BUN 64 (H) 03/21/2015 1512   CREATININE 5.40 (H) 04/29/2024 0712   CREATININE 7.71 (H) 03/21/2015 1512  CALCIUM  9.0 04/18/2024 0901   CALCIUM  8.5 (L) 03/21/2015 1512   GFRNONAA 5 (L) 04/18/2024 0901   GFRNONAA 7 (L) 03/21/2015 1512   GFRAA 21 (L) 03/08/2020 1756   GFRAA 8 (L) 03/21/2015 1512   CrCl cannot be calculated (Patient's most recent lab result is older than the maximum 21 days allowed.).  COAG Lab Results  Component Value Date   INR 1.1 02/26/2024   INR 1.1 06/10/2022   INR 1.1 12/13/2014    Radiology No results found.   Assessment/Plan 1. ESRD (end stage renal disease) (HCC) (Primary) Recommend:  The patient is experiencing increasing problems with their dialysis access.  Patient should have a fistulagram with the intention for intervention.  The intention for intervention is to restore appropriate flow and prevent thrombosis and possible loss of the access.  As well as improve the quality of dialysis therapy.  The risks, benefits and alternative therapies were reviewed in detail with the patient.  All questions were answered.  The patient agrees to proceed with angio/intervention.    The patient will follow up with me in the office after the procedure.    2. Type 2 diabetes mellitus with hyperlipidemia (HCC) Continue hypoglycemic medications as already ordered, these medications have been reviewed and there are no changes at this time.   Hgb A1C to be monitored as already arranged by primary service   3. Essential  hypertension Continue antihypertensive medications as already ordered, these medications have been reviewed and there are no changes at this time.   Cordella Shawl, MD  10/12/2024 3:34 PM

## 2024-10-12 NOTE — Op Note (Signed)
 OPERATIVE NOTE   PROCEDURE: Contrast injection right brachial cephalic fistula  PRE-OPERATIVE DIAGNOSIS: Complication of dialysis access                                                       End Stage Renal Disease  POST-OPERATIVE DIAGNOSIS: same as above   SURGEON: Cordella JUDITHANN Shawl, M.D.  ANESTHESIA: Conscious sedation was administered under my direct supervision by the interventional radiology RN.  IV Versed  plus fentanyl  were utilized. Continuous ECG, pulse oximetry and blood pressure was monitored throughout the entire procedure.  Conscious sedation was for a total of 34 minutes and 25 seconds.  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 male who  presents with malfunctioning right arm AV access.  The patient is scheduled for angiography with possible intervention of the AV access to prevent loss of the permanent access.  The patient is aware the risks include but are not limited to: bleeding, infection, thrombosis of the cannulated access, and possible anaphylactic reaction to the contrast.  The patient acknowledges if the access can not be salvaged a tunneled catheter will be needed and will be placed during this procedure.  The patient is aware of the risks of the procedure and elects to proceed with the angiogram and intervention.  DESCRIPTION: After full informed written consent was obtained, the patient was brought back to the Special Procedure suite and placed supine position.  Appropriate cardiopulmonary monitors were placed.  The right arm was prepped and draped in the standard fashion.  Appropriate timeout is called.   The right brachiocephalic access was cannulated with a micropuncture needle under ultrasound guidence.  Ultrasound was used to evaluate the right  brachiocephalic access.  It was echolucent and compressible indicating it is patent .  An ultrasound image was acquired for the permanent record.  A micropuncture needle was used to access the right brachiocephalic access under direct ultrasound guidance.  The microwire was then advanced under fluoroscopic guidance without difficulty followed by the micro-sheath.  The J-wire was then advanced and a 6 Fr sheath inserted.  Hand injections were completed to image the access from the arterial anastomosis through the entire access.  The central venous structures were also imaged by hand injections.  I did attempt to cross the innominate vein occlusion with both a stiff angled Glidewire a V18 wire as well as an advantage wire using both a seeker catheter and a Kumpe catheter this was not successful.  Based on the images, patient will require surgical revision.    A 4-0 Monocryl purse-string suture was sewn around the sheath.  The sheath was removed and light pressure was applied.  A sterile bandage was applied to the puncture site.  Summary: Patient will require conversion to a hybrid hero graft this should allow the fistula to be used in the area that has been accessed previously as this would be undisturbed with this revision.  COMPLICATIONS: None  CONDITION: Stable  Cordella JUDITHANN Shawl, M.D  [facility] Office: [phone]  10/12/2024 4:34 PM

## 2024-10-12 NOTE — Interval H&P Note (Signed)
 History and Physical Interval Note:  10/12/2024 3:37 PM  Lawrence [name]  has presented today for surgery, with the diagnosis of L Arm Fistulagram   End Stage Renal.  The various methods of treatment have been discussed with the patient and family. After consideration of risks, benefits and other options for treatment, the patient has consented to  Procedure(s): A/V Fistulagram (Left) as a surgical intervention.  The patient's history has been reviewed, patient examined, no change in status, stable for surgery.  I have reviewed the patient's chart and labs.  Questions were answered to the patient's satisfaction.     Cordella Shawl

## 2024-10-13 ENCOUNTER — Encounter: Payer: Self-pay | Admitting: Vascular Surgery

## 2024-10-20 ENCOUNTER — Encounter (INDEPENDENT_AMBULATORY_CARE_PROVIDER_SITE_OTHER): Payer: Self-pay | Admitting: Nurse Practitioner

## 2024-10-20 ENCOUNTER — Ambulatory Visit (INDEPENDENT_AMBULATORY_CARE_PROVIDER_SITE_OTHER): Admitting: Nurse Practitioner

## 2024-10-20 VITALS — BP 115/70 | HR 97 | Resp 18 | Ht 67.0 in | Wt 160.0 lb

## 2024-10-20 DIAGNOSIS — E785 Hyperlipidemia, unspecified: Secondary | ICD-10-CM

## 2024-10-20 DIAGNOSIS — E1169 Type 2 diabetes mellitus with other specified complication: Secondary | ICD-10-CM | POA: Diagnosis not present

## 2024-10-20 DIAGNOSIS — N186 End stage renal disease: Secondary | ICD-10-CM | POA: Diagnosis not present

## 2024-10-20 DIAGNOSIS — I10 Essential (primary) hypertension: Secondary | ICD-10-CM

## 2024-11-06 ENCOUNTER — Encounter (INDEPENDENT_AMBULATORY_CARE_PROVIDER_SITE_OTHER): Payer: Self-pay | Admitting: Nurse Practitioner

## 2024-11-06 NOTE — Progress Notes (Signed)
 Subjective:    Patient ID: Lawrence [name], male    DOB: 1956-01-27, 68 y.o.   MRN: [id number] Chief Complaint  Patient presents with   Follow-up    Vascular surgery no studies    The patient returns to the office for followup status post intervention of the dialysis access 10/12/2024.   Following the intervention the excess function was unchanged per the patient.  Intervention was unable to be completed. No recent shortening of the patient's walking distance or new symptoms consistent with claudication.  No history of rest pain symptoms. No new ulcers or wounds of the lower extremities have occurred.  The patient denies amaurosis fugax or recent TIA symptoms. There are no recent neurological changes noted. There is no history of DVT, PE or superficial thrombophlebitis. No recent episodes of angina or shortness of breath documented.  It was noted that surgical intervention will be required to repair access.      Review of Systems  All other systems reviewed and are negative.      Objective:   Physical Exam Vitals reviewed.  HENT:     Head: Normocephalic.  Cardiovascular:     Rate and Rhythm: Normal rate.     Pulses: Normal pulses.  Pulmonary:     Effort: Pulmonary effort is normal.  Skin:    General: Skin is warm and dry.  Neurological:     Mental Status: He is alert and oriented to person, place, and time.  Psychiatric:        Mood and Affect: Mood normal.        Behavior: Behavior normal.        Thought Content: Thought content normal.        Judgment: Judgment normal.    BP 115/70 (BP Location: Left Arm, Patient Position: Sitting, Cuff Size: Normal)   Pulse 97   Resp 18   Ht 5' 7 (1.702 m)   Wt 160 lb (72.6 kg)   BMI 25.06 kg/m   Past Medical History:  Diagnosis Date   Anemia    Cerebral hemorrhage (HCC) 2012   Chronic constipation    Chronic kidney disease    Diabetes mellitus without complication (HCC)    Hemodialysis patient    Hyperlipidemia     Hypertension    Schizophrenia (HCC)    Stroke [facility])     Social History   Socioeconomic History   Marital status: Single    Spouse name: Not on file   Number of children: Not on file   Years of education: Not on file   Highest education level: Not on file  Occupational History   Not on file  Tobacco Use   Smoking status: Never   Smokeless tobacco: Never  Vaping Use   Vaping status: Never Used  Substance and Sexual Activity   Alcohol use: No   Drug use: No   Sexual activity: Not Currently    Birth control/protection: None  Other Topics Concern   Not on file  Social History Narrative    Lives at group home: [facility] with 6 other people.   Social Drivers of Corporate Investment Banker Strain: Low Risk  (08/23/2024)   Received from [facility]   Overall Financial Resource Strain (CARDIA)    Difficulty of Paying Living Expenses: Not hard at all  Food Insecurity: No Food Insecurity (08/23/2024)   Received from [facility]   Hunger Vital Sign    Within the  past 12 months, you worried that your food would run out before you got the money to buy more.: Never true    Within the past 12 months, the food you bought just didn't last and you didn't have money to get more.: Never true  Transportation Needs: No Transportation Needs (08/23/2024)   Received from [facility] - Transportation    In the past 12 months, has lack of transportation kept you from medical appointments or from getting medications?: No    Lack of Transportation (Non-Medical): No  Physical Activity: Not on file  Stress: Not on file  Social Connections: Unknown (02/26/2024)   Social Connection and Isolation Panel    Frequency of Communication with Friends and Family: More than three times a week    Frequency of Social Gatherings with Friends and Family: Twice a week    Attends Religious Services: 1 to 4 times per year    Active  Member of [employer] or Organizations: No    Attends Banker Meetings: Never    Marital Status: Patient declined  Catering Manager Violence: Not At Risk (02/26/2024)   Humiliation, Afraid, Rape, and Kick questionnaire    Fear of Current or Ex-Partner: No    Emotionally Abused: No    Physically Abused: No    Sexually Abused: No    Past Surgical History:  Procedure Laterality Date   A/V FISTULAGRAM Left 02/24/2017   Procedure: A/V Fistulagram;  Surgeon: Cordella KANDICE Shawl, MD;  Location: [facility];  Service: Cardiovascular;  Laterality: Left;   A/V FISTULAGRAM Left 09/03/2020   Procedure: A/V FISTULAGRAM;  Surgeon: Marea Selinda RAMAN, MD;  Location: [facility];  Service: Cardiovascular;  Laterality: Left;   A/V FISTULAGRAM Left 06/27/2021   Procedure: A/V FISTULAGRAM;  Surgeon: Marea Selinda RAMAN, MD;  Location: [facility];  Service: Cardiovascular;  Laterality: Left;   A/V FISTULAGRAM Left 07/24/2021   Procedure: A/V FISTULAGRAM;  Surgeon: Marea Selinda RAMAN, MD;  Location: [facility];  Service: Cardiovascular;  Laterality: Left;   A/V FISTULAGRAM Left 03/25/2022   Procedure: A/V Fistulagram;  Surgeon: Tisa Curry LABOR, MD;  Location: [facility];  Service: Cardiovascular;  Laterality: Left;   A/V FISTULAGRAM N/A 06/12/2022   Procedure: A/V Fistulagram;  Surgeon: Shawl Cordella KANDICE, MD;  Location: [facility];  Service: Cardiovascular;  Laterality: N/A;   A/V FISTULAGRAM Right 10/12/2024   Procedure: A/V Fistulagram;  Surgeon: Shawl Cordella KANDICE, MD;  Location: [facility];  Service: Cardiovascular;  Laterality: Right;   A/V SHUNT INTERVENTION N/A 02/29/2024   Procedure: A/V SHUNT INTERVENTION;  Surgeon: Marea Selinda RAMAN, MD;  Location: [facility];  Service: Cardiovascular;  Laterality: N/A;   AV FISTULA PLACEMENT Right 04/29/2024   Procedure: ARTERIOVENOUS (AV) FISTULA CREATION;  Surgeon: Shawl Cordella KANDICE, MD;  Location: [facility];   Service: Vascular;  Laterality: Right;  BRACHIALCEPHALIC   DIALYSIS/PERMA CATHETER INSERTION N/A 12/03/2021   Procedure: DIALYSIS/PERMA CATHETER INSERTION;  Surgeon: Marea Selinda RAMAN, MD;  Location: [facility];  Service: Cardiovascular;  Laterality: N/A;   DIALYSIS/PERMA CATHETER INSERTION N/A 03/27/2022   Procedure: DIALYSIS/PERMA CATHETER INSERTION;  Surgeon: Marea Selinda RAMAN, MD;  Location: [facility];  Service: Cardiovascular;  Laterality: N/A;   DIALYSIS/PERMA CATHETER INSERTION N/A 03/09/2024   Procedure: DIALYSIS/PERMA CATHETER INSERTION;  Surgeon: Marea Selinda RAMAN, MD;  Location: [facility];  Service: Cardiovascular;  Laterality:  N/A;   DIALYSIS/PERMA CATHETER REMOVAL N/A 03/13/2022   Procedure: DIALYSIS/PERMA CATHETER REMOVAL;  Surgeon: Marea Selinda RAMAN, MD;  Location: [facility];  Service: Cardiovascular;  Laterality: N/A;   INSERTION OF DIALYSIS CATHETER Right 02/27/2024   Procedure: INSERTION OF DIALYSIS CATHETER;  Surgeon: Laurence Redell CROME, MD;  Location: [facility];  Service: Vascular;  Laterality: Right;   LIGATION OF ARTERIOVENOUS  FISTULA Left 04/29/2024   Procedure: LIGATION OF ARTERIOVENOUS  FISTULA;  Surgeon: Jama Cordella MATSU, MD;  Location: [facility];  Service: Vascular;  Laterality: Left;   PERIPHERAL VASCULAR CATHETERIZATION N/A 04/17/2015   Procedure: Dialysis/Perma Catheter Insertion;  Surgeon: Cordella MATSU Jama, MD;  Location: [facility];  Service: Cardiovascular;  Laterality: N/A;   PERIPHERAL VASCULAR CATHETERIZATION Left 07/30/2015   Procedure: A/V Shuntogram/Fistulagram;  Surgeon: Selinda RAMAN Marea, MD;  Location: [facility];  Service: Cardiovascular;  Laterality: Left;   PERIPHERAL VASCULAR CATHETERIZATION Left 07/30/2015   Procedure: A/V Shunt Intervention;  Surgeon: Selinda RAMAN Marea, MD;  Location: [facility];  Service: Cardiovascular;  Laterality: Left;   PERIPHERAL VASCULAR CATHETERIZATION N/A 08/20/2015   Procedure: DIALYSIS/PERMA  CATHETER REMOVAL;  Surgeon: Selinda RAMAN Marea, MD;  Location: [facility];  Service: Cardiovascular;  Laterality: N/A;   PERIPHERAL VASCULAR THROMBECTOMY Left 03/27/2022   Procedure: PERIPHERAL VASCULAR THROMBECTOMY;  Surgeon: Marea Selinda RAMAN, MD;  Location: [facility];  Service: Cardiovascular;  Laterality: Left;   PERIPHERAL VASCULAR THROMBECTOMY Left 08/04/2022   Procedure: PERIPHERAL VASCULAR THROMBECTOMY;  Surgeon: Marea Selinda RAMAN, MD;  Location: [facility];  Service: Cardiovascular;  Laterality: Left;   PERIPHERAL VASCULAR THROMBECTOMY Left 09/01/2023   Procedure: PERIPHERAL VASCULAR THROMBECTOMY;  Surgeon: Marea Selinda RAMAN, MD;  Location: [facility];  Service: Cardiovascular;  Laterality: Left;   REVISON OF ARTERIOVENOUS FISTULA Left 12/04/2021   Procedure: REVISON OF ARTERIOVENOUS FISTULA;  Surgeon: Marea Selinda RAMAN, MD;  Location: [facility];  Service: Vascular;  Laterality: Left;   TEMPORARY DIALYSIS CATHETER N/A 06/11/2022   Procedure: TEMPORARY DIALYSIS CATHETER;  Surgeon: Jama Cordella MATSU, MD;  Location: [facility];  Service: Cardiovascular;  Laterality: N/A;   THROMBECTOMY W/ EMBOLECTOMY Left 12/04/2021   Procedure: THROMBECTOMY ARTERIOVENOUS FISTULA;  Surgeon: Marea Selinda RAMAN, MD;  Location: [facility];  Service: Vascular;  Laterality: Left;   VASCULAR SURGERY     Fistula placement    Family History  Problem Relation Age of Onset   Diabetes Mother    Kidney cancer Mother    Diabetes Sister    Stroke Brother    Prostate cancer Neg Hx    Bladder Cancer Neg Hx     Allergies  Allergen Reactions   Nsaids Other (See Comments)    Cannot take due to renal disease       Latest Ref Rng & Units 04/29/2024    7:12 AM 04/18/2024    9:01 AM 03/07/2024    5:12 PM  CBC  WBC 4.0 - 10.5 K/uL  4.8  6.3   Hemoglobin 13.0 - 17.0 g/dL 89.0  9.9  88.6   Hematocrit 39.0 - 52.0 % 32.0  30.6  35.3   Platelets 150 - 400 K/uL  88  140       CMP     Component Value  Date/Time   NA 135 04/29/2024 0712   NA 140 03/21/2015 1512   K 3.6 04/29/2024 0712   K 4.0 03/21/2015 1512   CL  96 (L) 04/29/2024 0712   CL 95 (L) 03/21/2015 1512   CO2 26 04/18/2024 0901   CO2 36 (H) 03/21/2015 1512   GLUCOSE 103 (H) 04/29/2024 0712   GLUCOSE 117 (H) 03/21/2015 1512   BUN 23 04/29/2024 0712   BUN 64 (H) 03/21/2015 1512   CREATININE 5.40 (H) 04/29/2024 0712   CREATININE 7.71 (H) 03/21/2015 1512   CALCIUM  9.0 04/18/2024 0901   CALCIUM  8.5 (L) 03/21/2015 1512   PROT 6.8 08/01/2022 2129   PROT 5.8 (L) 12/13/2014 1354   ALBUMIN 3.2 (L) 02/27/2024 1635   ALBUMIN 2.5 (L) 12/13/2014 1354   AST 16 08/01/2022 2129   AST 17 12/13/2014 1354   ALT 14 08/01/2022 2129   ALT 21 12/13/2014 1354   ALKPHOS 149 (H) 08/01/2022 2129   ALKPHOS 50 12/13/2014 1354   BILITOT 0.8 08/01/2022 2129   BILITOT 0.2 12/13/2014 1354   GFRNONAA 5 (L) 04/18/2024 0901   GFRNONAA 7 (L) 03/21/2015 1512     No results found.     Assessment & Plan:   1. ESRD (end stage renal disease) (HCC) (Primary) Recommend:  At this time the patient does not have appropriate extremity access for dialysis  Patient should have a right upper extremity hybrid Hero graft created.  The risks, benefits and alternative therapies were reviewed in detail with the patient.  All questions were answered.  The patient agrees to proceed with surgery.   The patient will follow up with me in the office after the surgery.  2. Type 2 diabetes mellitus with hyperlipidemia (HCC) Continue hypoglycemic medications as already ordered, these medications have been reviewed and there are no changes at this time.  Hgb A1C to be monitored as already arranged by primary service  3. Essential hypertension Continue antihypertensive medications as already ordered, these medications have been reviewed and there are no changes at this time.   Current Outpatient Medications on File Prior to Visit  Medication Sig Dispense Refill    acetaminophen  (TYLENOL ) 500 MG tablet Take 500 mg by mouth every 4 (four) hours as needed for moderate pain (pain score 4-6).     aspirin  EC 81 MG tablet Take 81 mg by mouth daily.      benztropine  (COGENTIN ) 0.5 MG tablet Take 0.5 mg by mouth 2 (two) times daily.     cetirizine (ZYRTEC) 10 MG tablet Take 10 mg by mouth every other day.     Cholecalciferol  (VITAMIN D3) 125 MCG (5000 UT) CAPS Take 5,000 Units by mouth once a week.     clopidogrel  (PLAVIX ) 75 MG tablet Take 1 tablet (75 mg total) by mouth daily. 30 tablet 0   diphenhydrAMINE  (BENADRYL ) 25 mg capsule Take 25 mg by mouth See admin instructions. Take 1 capsule (25mg ) by mouth nightly as needed for sleep - may take 1 capsule (25mg ) by mouth during the daytime as needed for itching     docusate sodium  (COLACE) 100 MG capsule Take 100 mg by mouth 2 (two) times daily.     feeding supplement (BOOST HIGH PROTEIN) LIQD Take 1 Container by mouth See admin instructions. Take 1 container by mouth three times a week or as needed of Nutritional Supplement     fluticasone  (FLONASE ) 50 MCG/ACT nasal spray Place 2 sprays into both nostrils daily.     haloperidol  (HALDOL ) 10 MG tablet Take 5-10 mg by mouth See admin instructions. Take 5 mg in the morning and 10 mg at bedtime     HYDROcodone -acetaminophen  (NORCO/VICODIN)  5-325 MG tablet Take 1 tablet by mouth every 6 (six) hours as needed for moderate pain (pain score 4-6) or severe pain (pain score 7-10). 20 tablet 0   mirtazapine  (REMERON ) 15 MG tablet Take 15 mg by mouth at bedtime.     Multiple Vitamin (DAILY-VITE) TABS Take 1 tablet by mouth daily.     niacin  (NIASPAN ) 1000 MG CR tablet Take 1,000 mg by mouth at bedtime.      patiromer  (VELTASSA ) 8.4 g packet Take 8.4 g by mouth daily.     polyethylene glycol (MIRALAX  / GLYCOLAX ) 17 g packet Take 17 g by mouth daily as needed (Constipation). Mix 17 g (1 capful) in to 8 ounces of Juice/Water     pravastatin  (PRAVACHOL ) 40 MG tablet Take 40 mg by  mouth at bedtime.      No current facility-administered medications on file prior to visit.    There are no Patient Instructions on file for this visit. No follow-ups on file.   Antero Derosia E Eman Rynders, NP

## 2024-11-07 ENCOUNTER — Telehealth (INDEPENDENT_AMBULATORY_CARE_PROVIDER_SITE_OTHER): Payer: Self-pay

## 2024-11-07 NOTE — Telephone Encounter (Signed)
 I called the facility the patient resides at and the patient has been scheduled to have a right HERO hybrid revision with Dr. Jama on 11/11/24 at the [facility]. Pre-admit will call to scheduled pre-op  at the [facility]. Pre-surgical instructions were discussed and will be faxed to the facility.

## 2024-11-08 ENCOUNTER — Ambulatory Visit (INDEPENDENT_AMBULATORY_CARE_PROVIDER_SITE_OTHER): Admitting: Nurse Practitioner

## 2024-11-08 ENCOUNTER — Encounter (INDEPENDENT_AMBULATORY_CARE_PROVIDER_SITE_OTHER)

## 2024-11-09 ENCOUNTER — Other Ambulatory Visit (INDEPENDENT_AMBULATORY_CARE_PROVIDER_SITE_OTHER): Payer: Self-pay | Admitting: Nurse Practitioner

## 2024-11-09 DIAGNOSIS — N186 End stage renal disease: Secondary | ICD-10-CM

## 2024-11-09 NOTE — Progress Notes (Signed)
 Patient: Lawrence [name], Lawrence [name] [birth year], 68y, M Dialysis Location: [facility] Attending Nephrologist: Munsoor  Bailey Sherry  Service Date: 11/09/2024 Service Provider: Vernell Beams, NP I met face to face with the patient today.  OVERVIEW The patient presented with ESRD on dialysis Primary cause of renal failure: Type 2 diabetes mellitus with diabetic chronic kidney disease  Comments: 11/09/24: Seen on HD. Tolerating well. States he did not go to the vasc access appointment yesterday and is awaiting a reschedule. RAAVF is with good thrill. Has a small knot noted in the center of the access.   11/04/24: Seen on HD. Tolerating well. Fistulagram on 11/5 showed: Summary: Patient will require conversion to a hybrid hero graft this should allow the fistula to be used in the area that has been accessed previously as this would be undisturbed with this revision. Scheduled for 12/2 at 10:30. We will continue to use his current access for now. He also has a cvc in place.    10/28/24:  Patient seen and evaluated during HD rounds, has been doing well with treatments, no cramping.  It appears that he will need to have HERO graft placed.  Still using catheter for now.   10/24/24: Seen on HD. Tolerating well. Using CVC with no issues.  10/14/24: Seen on HD. Tolerating well. Hgb is stable. Fistulagram on 11/5 showed: Summary: Patient will require conversion to a hybrid hero graft this should allow the fistula to be used in the area that has been accessed previously as this would be undisturbed with this revision. Scheduled for 12/2 at 10:30. We will continue to use his current access for now. He also has a cvc in place.  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   BP Stand Pre   11/07/2024: 139/90   11/01/2024: 106/74  BP Sit Pre   11/07/2024: 140/78   11/04/2024: 121/65   11/01/2024: 103/67  BP Stand Post   11/07/2024: 109/67   11/04/2024: 127/70   11/01/2024: 118/92  BP Sit Post   11/07/2024: 105/73   11/04/2024: 110/63   11/01/2024: 97/67  Prescribed Tx time   11/07/2024: 3:30   11/04/2024: 3:30   11/01/2024: 3:30  Tx Duration   11/07/2024: 3:32   11/04/2024: 3:31   11/01/2024: 3:51  Missed Treatments 0 - last 30 days 0 - last 60 days  FLUID ASSESSMENT Comments: Currently EDW of 71 maintained.   EDW (kg)   11/07/2024: 71.0   11/04/2024: 71.0   11/01/2024: 71.0  Weight Pre (kg)   11/07/2024: 74.0   11/04/2024: 72.7   11/01/2024: 71.8  Weight Post (kg)   11/07/2024: 70.5   11/04/2024: 70.7   11/01/2024: 70.2  PWV (kg)   11/07/2024: -0.5   11/04/2024: -0.3   11/01/2024: -0.8  UF Rate (mL/kg/hr)   11/07/2024: 14.1   11/04/2024: 8   11/01/2024: 5.9  ADEQUACY ASSESSMENT Comments: KT/V at target at 1.48  spKt/V, URR   10/19/2024: 1.48, 72.5   09/14/2024: 1.71, 77.0   08/17/2024: 1.55, 73.0  ACCESS ASSESSMENT   Access Type: CVCatheter   Access SubType:  Tunneled   Access Status: Active (In Use) - 03/10/2024   Access Location: Chest   Placed: 03/09/2024  Comments: jump graft placed 12/28, good access flow.  Access declotted 09/01/23.  Access flow >2000 on 01/11/24. Access declotted 3/25  04/29/24: Left arm AV graft resection as well as creation of RUA AVF.     First cannulated 08/03/24  10/12/24: Fistulagram: Summary: Patient will require conversion to a hybrid hero graft this should allow the fistula to be used in the area that has been accessed previously as this would be undisturbed with this revision. Scheduled for 12/2 at  10:30.  ANEMIA ASSESSMENT Comments: Hgb at target at 10.9  HGB, TSAT   11/01/2024: 10.9, -   10/26/2024: 10.4, -   10/19/2024: 11.0, 31.0    Ferritin   10/19/2024: 946.0   09/14/2024: 1092.0   08/17/2024: 1368.0  Mircera, IVP (mcg)   10/30/2024: 30  Iron Sucrose (Venofer) (mg)   11/07/2024: 50   10/30/2024: 50   10/24/2024: 50  BMM ASSESSMENT Comments: PTH, Ca, Phos at target  PTH, Intact   10/19/2024: 376.0   09/14/2024: 231.0   08/17/2024: 372.0    Calcium , Phosphorus   10/19/2024: 9.7, 3.3   09/14/2024: 9.3, 3.6   08/17/2024: 9.8, 3.6  Vitamin D  (Calcitriol) Oral (mcg)   11/07/2024: 0.25   11/04/2024: 0.25   11/01/2024: 0.25  Cinacalcet (Sensipar) (mg)   11/07/2024: 60   11/04/2024: 60   11/01/2024: 60  NUTRITION ASSESSMENT Comments: Albumin and K both at target.   Potassium, Albumin   10/19/2024: 4.8, 4.1   09/14/2024: 5.0, 3.6   08/17/2024: 5.1, 4.1    eNPCR   09/14/2024: 0.93   08/17/2024: 0.94   07/20/2024: 0.9  PHYSICAL EXAM  Exam Performed. CV - Blood pressure noted. EXT - 3+ edema. AVF/AVG Positive thrill/bruit.  DIAGNOSIS Chief Complaint: N18.6 End stage renal disease   Patient data updated 11/09/2024 at 11:47 AM Signed By: Holli Millman, NP  on 11/09/2024 11:50:25 AM

## 2024-11-10 ENCOUNTER — Inpatient Hospital Stay
Admission: RE | Admit: 2024-11-10 | Discharge: 2024-11-10 | Disposition: A | Source: Ambulatory Visit | Attending: Vascular Surgery | Admitting: Vascular Surgery

## 2024-11-10 ENCOUNTER — Other Ambulatory Visit: Payer: Self-pay

## 2024-11-10 DIAGNOSIS — N186 End stage renal disease: Secondary | ICD-10-CM

## 2024-11-10 DIAGNOSIS — E119 Type 2 diabetes mellitus without complications: Secondary | ICD-10-CM

## 2024-11-10 NOTE — Patient Instructions (Signed)
 Your procedure is scheduled on: 11/11/24 - Friday Report to the Registration Desk on the 1st floor of the [facility]. To find out your arrival time, please call [phone] between 1PM - 3PM on: 11/10/24 - Thursday If your arrival time is 6:00 am, do not arrive before that time as the [facility] entrance doors do not open until 6:00 am.  REMEMBER: Instructions that are not followed completely may result in serious medical risk, up to and including death; or upon the discretion of your surgeon and anesthesiologist your surgery may need to be rescheduled.  Do not eat food or drink any liquids after midnight the night before surgery.  No gum chewing or hard candies.  One week prior to surgery: Stop Anti-inflammatories (NSAIDS) such as Advil, Aleve, Ibuprofen, Motrin, Naproxen, Naprosyn and Aspirin  based products such as Excedrin, Goody's Powder, BC Powder. You may continue to take Tylenol  if needed for pain up until the day of surgery.  Stop ANY OVER THE COUNTER supplements until after surgery.  Hold clopidogrel  (PLAVIX ) 5 days prior to surgery last dose 11/29.  ON THE DAY OF SURGERY ONLY TAKE THESE MEDICATIONS WITH SIPS OF WATER:  benztropine  (COGENTIN )  haloperidol  (HALDOL )    No Alcohol for 24 hours before or after surgery.  No Smoking including e-cigarettes for 24 hours before surgery.  No chewable tobacco products for at least 6 hours before surgery.  No nicotine patches on the day of surgery.  Do not use any recreational drugs for at least a week (preferably 2 weeks) before your surgery.  Please be advised that the combination of cocaine and anesthesia may have negative outcomes, up to and including death. If you test positive for cocaine, your surgery will be cancelled.  On the morning of surgery brush your teeth with toothpaste and water, you may rinse your mouth with mouthwash if you wish. Do not swallow any toothpaste or mouthwash.  Use CHG Soap or wipes as  directed on instruction sheet.  Do not wear jewelry, make-up, hairpins, clips or nail polish.  For welded (permanent) jewelry: bracelets, anklets, waist bands, etc.  Please have this removed prior to surgery.  If it is not removed, there is a chance that hospital personnel will need to cut it off on the day of surgery.  Do not wear lotions, powders, or perfumes.   Do not shave body hair from the neck down 48 hours before surgery.  Contact lenses, hearing aids and dentures may not be worn into surgery.  Do not bring valuables to the hospital. [facility] is not responsible for any missing/lost belongings or valuables.   Notify your doctor if there is any change in your medical condition (cold, fever, infection).  Wear comfortable clothing (specific to your surgery type) to the hospital.  After surgery, you can help prevent lung complications by doing breathing exercises.  Take deep breaths and cough every 1-2 hours. Your doctor may order a device called an Incentive Spirometer to help you take deep breaths. When coughing or sneezing, hold a pillow firmly against your incision with both hands. This is called "splinting." Doing this helps protect your incision. It also decreases belly discomfort.  If you are being admitted to the hospital overnight, leave your suitcase in the car. After surgery it may be brought to your room.  In case of increased patient census, it may be necessary for you, the patient, to continue your postoperative care in the Same Day Surgery department.  If you are  being discharged the day of surgery, you will not be allowed to drive home. You will need a responsible individual to drive you home and stay with you for 24 hours after surgery.   If you are taking public transportation, you will need to have a responsible individual with you.  Please call the Pre-admissions Testing Dept. at [phone] if you have any questions about these instructions.  Surgery  Visitation Policy:  Patients having surgery or a procedure may have two visitors.  Children under the age of 51 must have an adult with them who is not the patient.  Inpatient Visitation:    Visiting hours are 7 a.m. to 8 p.m. Up to four visitors are allowed at one time in a patient room. The visitors may rotate out with other people during the day.  One visitor age 35 or older may stay with the patient overnight and must be in the room by 8 p.m.   Merchandiser, Retail to address health-related social needs:  [email or web address] Preparing the Skin Before Surgery  To help prevent the risk of infection at your surgical site, we are now providing you with rinse-free Sage 2% Chlorhexidine  Gluconate (CHG) disposable wipes.  Chlorhexidine  Gluconate (CHG) Soap  o An antiseptic cleaner that kills germs and bonds with the skin to continue killing germs even after washing  o Used for showering the night before surgery and morning of surgery  The night before surgery: Shower or bathe with warm water. Do not apply perfume, lotions, powders. Wait one hour after shower. Skin should be dry and cool. Open Sage wipe package - use 6 disposable cloths. Wipe body using one cloth for the right arm, one cloth for the left arm, one cloth for the right leg, one cloth for the left leg, one cloth for the chest/abdomen area, and one cloth for the back. Do not use on open wounds or sores. Do not use on face or genitals (private parts). If you are breast feeding, do not use on breasts. 5. Do not rinse, allow to dry. 6. Skin may feel tacky for several minutes. 7. Dress in clean clothes. 8. Place clean sheets on your bed and do not sleep with pets.  REPEAT ABOVE ON THE MORNING OF SURGERY BEFORE ARRIVING TO THE HOSPITAL.

## 2024-11-10 NOTE — Pre-Procedure Instructions (Signed)
 PAT interview complete with group home care provider Graves,Alfreda (Other) [phone] [facility] Phone) , requested copy of patients most recent MAR be faxed to me, I will fax a copy of instructions to Alfreda, Instructions was reviewed. Verbal consent has been signed with a witness, Jerelene Kuklinski sister and legal guardian has given consent for patient to have procedure, per Alfreda patient has not stopped hid Plavix , V&V made aware, Alfreda is concerned about weather tomorrow 12/05, and most likely will reschedule.

## 2024-11-10 NOTE — Anesthesia Preprocedure Evaluation (Signed)
 Anesthesia Evaluation  Patient identified by MRN, date of birth, ID band Patient awake    Reviewed: Allergy & Precautions, H&P , NPO status , Patient's Chart, lab work & pertinent test results, reviewed documented beta blocker date and time   Airway Mallampati: III  TM Distance: >3 FB Neck ROM: full    Dental  (+) Edentulous Lower, Edentulous Upper   Pulmonary neg pulmonary ROS   Pulmonary exam normal        Cardiovascular hypertension, Pt. on home beta blockers and Pt. on medications Normal cardiovascular exam     Neuro/Psych  PSYCHIATRIC DISORDERS (Chronic schizoaffective disorder)    Schizophrenia  CVA (2012)    GI/Hepatic negative GI ROS, Neg liver ROS,,,  Endo/Other  diabetes    Renal/GU ESRF and DialysisRenal disease  negative genitourinary   Musculoskeletal   Abdominal Normal abdominal exam  (+)   Peds  Hematology negative hematology ROS (+)   Anesthesia Other Findings Past Medical History: No date: Anemia 2012: Cerebral hemorrhage (HCC) No date: Chronic constipation No date: Chronic kidney disease No date: Diabetes mellitus without complication (HCC) No date: Hemodialysis patient (HCC) No date: Hyperlipidemia No date: Hypertension No date: Schizophrenia (HCC) No date: Stroke [facility])  Past Surgical History: 02/24/2017: A/V FISTULAGRAM; Left     Comment:  Procedure: A/V Fistulagram;  Surgeon: Cordella KANDICE Shawl,              MD;  Location: [facility];  Service:               Cardiovascular;  Laterality: Left; 09/03/2020: A/V FISTULAGRAM; Left     Comment:  Procedure: A/V FISTULAGRAM;  Surgeon: Marea Selinda RAMAN, MD;               Location: [facility];  Service: Cardiovascular;              Laterality: Left; 06/27/2021: A/V FISTULAGRAM; Left     Comment:  Procedure: A/V FISTULAGRAM;  Surgeon: Marea Selinda RAMAN, MD;               Location: [facility];  Service: Cardiovascular;               Laterality: Left; 07/24/2021: A/V FISTULAGRAM; Left     Comment:  Procedure: A/V FISTULAGRAM;  Surgeon: Marea Selinda RAMAN, MD;               Location: [facility];  Service: Cardiovascular;              Laterality: Left; 03/25/2022: A/V FISTULAGRAM; Left     Comment:  Procedure: A/V Fistulagram;  Surgeon: Tisa Curry LABOR,               MD;  Location: [facility];  Service:               Cardiovascular;  Laterality: Left; 06/12/2022: A/V FISTULAGRAM; N/A     Comment:  Procedure: A/V Fistulagram;  Surgeon: Shawl Cordella KANDICE, MD;  Location: [facility];  Service:               Cardiovascular;  Laterality: N/A; 12/03/2021: DIALYSIS/PERMA CATHETER INSERTION; N/A     Comment:  Procedure: DIALYSIS/PERMA CATHETER INSERTION;  Surgeon:               Marea Selinda RAMAN, MD;  Location: [facility];  Service: Cardiovascular;  Laterality: N/A; 03/27/2022: DIALYSIS/PERMA CATHETER INSERTION; N/A     Comment:  Procedure: DIALYSIS/PERMA CATHETER INSERTION;  Surgeon:               Marea Selinda RAMAN, MD;  Location: [facility];                Service: Cardiovascular;  Laterality: N/A; 03/13/2022: DIALYSIS/PERMA CATHETER REMOVAL; N/A     Comment:  Procedure: DIALYSIS/PERMA CATHETER REMOVAL;  Surgeon:               Marea Selinda RAMAN, MD;  Location: [facility];                Service: Cardiovascular;  Laterality: N/A; 04/17/2015: PERIPHERAL VASCULAR CATHETERIZATION; N/A     Comment:  Procedure: Dialysis/Perma Catheter Insertion;  Surgeon:               Cordella KANDICE Shawl, MD;  Location: [facility];                Service: Cardiovascular;  Laterality: N/A; 07/30/2015: PERIPHERAL VASCULAR CATHETERIZATION; Left     Comment:  Procedure: A/V Shuntogram/Fistulagram;  Surgeon: Selinda RAMAN Marea, MD;  Location: [facility];  Service:               Cardiovascular;  Laterality: Left; 07/30/2015: PERIPHERAL VASCULAR CATHETERIZATION;  Left     Comment:  Procedure: A/V Shunt Intervention;  Surgeon: Selinda RAMAN Marea, MD;  Location: [facility];  Service:               Cardiovascular;  Laterality: Left; 08/20/2015: PERIPHERAL VASCULAR CATHETERIZATION; N/A     Comment:  Procedure: DIALYSIS/PERMA CATHETER REMOVAL;  Surgeon:               Selinda RAMAN Marea, MD;  Location: [facility];                Service: Cardiovascular;  Laterality: N/A; 03/27/2022: PERIPHERAL VASCULAR THROMBECTOMY; Left     Comment:  Procedure: PERIPHERAL VASCULAR THROMBECTOMY;  Surgeon:               Marea Selinda RAMAN, MD;  Location: [facility];                Service: Cardiovascular;  Laterality: Left; 08/04/2022: PERIPHERAL VASCULAR THROMBECTOMY; Left     Comment:  Procedure: PERIPHERAL VASCULAR THROMBECTOMY;  Surgeon:               Marea Selinda RAMAN, MD;  Location: [facility];                Service: Cardiovascular;  Laterality: Left; 09/01/2023: PERIPHERAL VASCULAR THROMBECTOMY; Left     Comment:  Procedure: PERIPHERAL VASCULAR THROMBECTOMY;  Surgeon:               Marea Selinda RAMAN, MD;  Location: [facility];                Service: Cardiovascular;  Laterality: Left; 12/04/2021: REVISON OF ARTERIOVENOUS FISTULA; Left     Comment:  Procedure: REVISON OF ARTERIOVENOUS FISTULA;  Surgeon:               Marea Selinda RAMAN, MD;  Location: [facility];  Service:  Vascular;  Laterality: Left; 06/11/2022: TEMPORARY DIALYSIS CATHETER; N/A     Comment:  Procedure: TEMPORARY DIALYSIS CATHETER;  Surgeon:               Jama Cordella MATSU, MD;  Location: [facility];               Service: Cardiovascular;  Laterality: N/A; 12/04/2021: THROMBECTOMY W/ EMBOLECTOMY; Left     Comment:  Procedure: THROMBECTOMY ARTERIOVENOUS FISTULA;  Surgeon:              Marea Selinda RAMAN, MD;  Location: [facility];  Service:               Vascular;  Laterality: Left; No date: VASCULAR SURGERY     Comment:  Fistula placement  BMI    Body  Mass Index: 24.58 kg/m      Reproductive/Obstetrics negative OB ROS                              Anesthesia Physical Anesthesia Plan  ASA: 3  Anesthesia Plan: General   Post-op Pain Management: Ofirmev  IV (intra-op)* and Regional block*   Induction: Intravenous  PONV Risk Score and Plan:   Airway Management Planned: LMA  Additional Equipment:   Intra-op Plan:   Post-operative Plan: Extubation in OR  Informed Consent:      Dental Advisory Given  Plan Discussed with: CRNA and Surgeon  Anesthesia Plan Comments:          Anesthesia Quick Evaluation

## 2024-11-11 ENCOUNTER — Encounter: Payer: Self-pay | Admitting: Anesthesiology

## 2024-11-11 ENCOUNTER — Encounter: Admission: RE | Payer: Self-pay

## 2024-11-11 ENCOUNTER — Ambulatory Visit: Admission: RE | Admit: 2024-11-11 | Admitting: Vascular Surgery

## 2024-11-11 SURGERY — REVISON OF ARTERIOVENOUS FISTULA
Anesthesia: General | Laterality: Right

## 2024-11-14 ENCOUNTER — Telehealth (INDEPENDENT_AMBULATORY_CARE_PROVIDER_SITE_OTHER): Payer: Self-pay

## 2024-11-14 NOTE — Telephone Encounter (Signed)
 Lawrence [name] called in reference to patient at this time stating she wasn't told to stop plavix  for patient and getting him to facility for pre-admit Friday was going to be a concern for her. Looking at patients chart at this time pre-admission for patient was cancelled on Friday.

## 2024-12-29 ENCOUNTER — Encounter (INDEPENDENT_AMBULATORY_CARE_PROVIDER_SITE_OTHER): Payer: Self-pay | Admitting: Nurse Practitioner

## 2024-12-29 ENCOUNTER — Encounter (INDEPENDENT_AMBULATORY_CARE_PROVIDER_SITE_OTHER)

## 2024-12-29 ENCOUNTER — Ambulatory Visit (INDEPENDENT_AMBULATORY_CARE_PROVIDER_SITE_OTHER): Admitting: Nurse Practitioner

## 2024-12-29 VITALS — BP 118/77 | HR 96 | Resp 18 | Ht 67.0 in | Wt 158.6 lb

## 2024-12-29 DIAGNOSIS — N186 End stage renal disease: Secondary | ICD-10-CM

## 2024-12-29 DIAGNOSIS — E785 Hyperlipidemia, unspecified: Secondary | ICD-10-CM | POA: Diagnosis not present

## 2024-12-29 DIAGNOSIS — E1169 Type 2 diabetes mellitus with other specified complication: Secondary | ICD-10-CM | POA: Diagnosis not present

## 2024-12-29 DIAGNOSIS — I10 Essential (primary) hypertension: Secondary | ICD-10-CM | POA: Diagnosis not present

## 2025-01-01 ENCOUNTER — Encounter (INDEPENDENT_AMBULATORY_CARE_PROVIDER_SITE_OTHER): Payer: Self-pay | Admitting: Nurse Practitioner

## 2025-01-01 NOTE — Progress Notes (Signed)
 "  Subjective:    Patient ID: Lawrence [name], male    DOB: 27-Dec-1955, 69 y.o.   MRN: [id number] Chief Complaint  Patient presents with   Follow-up    Follow up to discuss surgery     HPI  Discussed the use of AI scribe software for clinical note transcription with the patient, who gave verbal consent to proceed.  History of Present Illness Lawrence [name] is a 69 year old male with end-stage renal disease on hemodialysis who presents for follow-up regarding rescheduling vascular access surgery.  He was previously scheduled for vascular access surgery, but the procedure was canceled due to ongoing anticoagulation and inclement weather. He was advised that a five-day cessation of blood thinners is required prior to surgery, and the initial three-day window was insufficient.  Attempts to reschedule the procedure for the upcoming Friday were unsuccessful because he was contacted only three days prior, which did not allow for the necessary five-day anticoagulation hold. He communicated this to the scheduler, who agreed to identify a new date that accommodates medication management and surgical scheduling constraints.  He continues to undergo hemodialysis via catheter access. He has not experienced new symptoms or complications since the last visit.    Results     Review of Systems  All other systems reviewed and are negative.      Objective:   Physical Exam Vitals reviewed.  HENT:     Head: Normocephalic.  Cardiovascular:     Rate and Rhythm: Normal rate.     Pulses: Normal pulses.  Pulmonary:     Effort: Pulmonary effort is normal.  Skin:    General: Skin is warm and dry.  Neurological:     Mental Status: He is alert and oriented to person, place, and time.  Psychiatric:        Mood and Affect: Mood normal.        Behavior: Behavior normal.        Thought Content: Thought content normal.        Judgment: Judgment normal.     Physical Exam    BP 118/77 (BP  Location: Left Arm, Patient Position: Sitting, Cuff Size: Normal)   Pulse 96   Resp 18   Ht 5' 7 (1.702 m)   Wt 158 lb 9.6 oz (71.9 kg)   BMI 24.84 kg/m   Past Medical History:  Diagnosis Date   Anemia    Cerebral hemorrhage (HCC) 2012   Chronic constipation    Chronic kidney disease    Diabetes mellitus without complication (HCC)    Hemodialysis patient    Hyperlipidemia    Hypertension    Schizophrenia (HCC)    Stroke [facility])     Social History   Socioeconomic History   Marital status: Single    Spouse name: Not on file   Number of children: Not on file   Years of education: Not on file   Highest education level: Not on file  Occupational History   Not on file  Tobacco Use   Smoking status: Never   Smokeless tobacco: Never  Vaping Use   Vaping status: Never Used  Substance and Sexual Activity   Alcohol use: No   Drug use: No   Sexual activity: Not Currently    Birth control/protection: None  Other Topics Concern   Not on file  Social History Narrative    Lives at group home: [facility] with 6 other people.   Social Drivers of  Health   Tobacco Use: Low Risk (01/01/2025)   Patient History    Smoking Tobacco Use: Never    Smokeless Tobacco Use: Never    Passive Exposure: Not on file  Financial Resource Strain: Low Risk  (08/23/2024)   Received from [facility]   Overall Financial Resource Strain (CARDIA)    Difficulty of Paying Living Expenses: Not hard at all  Food Insecurity: No Food Insecurity (08/23/2024)   Received from [facility]   Epic    Within the past 12 months, you worried that your food would run out before you got the money to buy more.: Never true    Within the past 12 months, the food you bought just didn't last and you didn't have money to get more.: Never true  Transportation Needs: No Transportation Needs (08/23/2024)   Received from [facility] - Transportation     In the past 12 months, has lack of transportation kept you from medical appointments or from getting medications?: No    Lack of Transportation (Non-Medical): No  Physical Activity: Not on file  Stress: Not on file  Social Connections: Unknown (02/26/2024)   Social Connection and Isolation Panel    Frequency of Communication with Friends and Family: More than three times a week    Frequency of Social Gatherings with Friends and Family: Twice a week    Attends Religious Services: 1 to 4 times per year    Active Member of [employer] or Organizations: No    Attends Banker Meetings: Never    Marital Status: Patient declined  Catering Manager Violence: Not At Risk (02/26/2024)   Humiliation, Afraid, Rape, and Kick questionnaire    Fear of Current or Ex-Partner: No    Emotionally Abused: No    Physically Abused: No    Sexually Abused: No  Depression (PHQ2-9): Not on file  Alcohol Screen: Not on file  Housing: Unknown (08/23/2024)   Received from [facility]   Epic    In the last 12 months, was there a time when you were not able to pay the mortgage or rent on time?: No    Number of Times Moved in the Last Year: Not on file    At any time in the past 12 months, were you homeless or living in a shelter (including now)?: No  Utilities: Not At Risk (08/23/2024)   Received from [facility]   Epic    In the past 12 months has the electric, gas, oil, or water company threatened to shut off services in your home?: No  Health Literacy: Not on file    Past Surgical History:  Procedure Laterality Date   A/V FISTULAGRAM Left 02/24/2017   Procedure: A/V Fistulagram;  Surgeon: Cordella KANDICE Shawl, MD;  Location: [facility];  Service: Cardiovascular;  Laterality: Left;   A/V FISTULAGRAM Left 09/03/2020   Procedure: A/V FISTULAGRAM;  Surgeon: Marea Selinda RAMAN, MD;  Location: [facility];  Service: Cardiovascular;  Laterality: Left;   A/V  FISTULAGRAM Left 06/27/2021   Procedure: A/V FISTULAGRAM;  Surgeon: Marea Selinda RAMAN, MD;  Location: [facility];  Service: Cardiovascular;  Laterality: Left;   A/V FISTULAGRAM Left 07/24/2021   Procedure: A/V FISTULAGRAM;  Surgeon: Marea Selinda RAMAN, MD;  Location: [facility];  Service: Cardiovascular;  Laterality: Left;   A/V FISTULAGRAM Left 03/25/2022   Procedure: A/V Fistulagram;  Surgeon:  Esco, Curry LABOR, MD;  Location: [facility];  Service: Cardiovascular;  Laterality: Left;   A/V FISTULAGRAM N/A 06/12/2022   Procedure: A/V Fistulagram;  Surgeon: Jama Cordella MATSU, MD;  Location: [facility];  Service: Cardiovascular;  Laterality: N/A;   A/V FISTULAGRAM Right 10/12/2024   Procedure: A/V Fistulagram;  Surgeon: Jama Cordella MATSU, MD;  Location: [facility];  Service: Cardiovascular;  Laterality: Right;   A/V SHUNT INTERVENTION N/A 02/29/2024   Procedure: A/V SHUNT INTERVENTION;  Surgeon: Marea Selinda RAMAN, MD;  Location: [facility];  Service: Cardiovascular;  Laterality: N/A;   AV FISTULA PLACEMENT Right 04/29/2024   Procedure: ARTERIOVENOUS (AV) FISTULA CREATION;  Surgeon: Jama Cordella MATSU, MD;  Location: [facility];  Service: Vascular;  Laterality: Right;  BRACHIALCEPHALIC   DIALYSIS/PERMA CATHETER INSERTION N/A 12/03/2021   Procedure: DIALYSIS/PERMA CATHETER INSERTION;  Surgeon: Marea Selinda RAMAN, MD;  Location: [facility];  Service: Cardiovascular;  Laterality: N/A;   DIALYSIS/PERMA CATHETER INSERTION N/A 03/27/2022   Procedure: DIALYSIS/PERMA CATHETER INSERTION;  Surgeon: Marea Selinda RAMAN, MD;  Location: [facility];  Service: Cardiovascular;  Laterality: N/A;   DIALYSIS/PERMA CATHETER INSERTION N/A 03/09/2024   Procedure: DIALYSIS/PERMA CATHETER INSERTION;  Surgeon: Marea Selinda RAMAN, MD;  Location: [facility];  Service: Cardiovascular;  Laterality: N/A;   DIALYSIS/PERMA CATHETER REMOVAL N/A 03/13/2022   Procedure: DIALYSIS/PERMA CATHETER  REMOVAL;  Surgeon: Marea Selinda RAMAN, MD;  Location: [facility];  Service: Cardiovascular;  Laterality: N/A;   INSERTION OF DIALYSIS CATHETER Right 02/27/2024   Procedure: INSERTION OF DIALYSIS CATHETER;  Surgeon: Laurence Redell CROME, MD;  Location: [facility];  Service: Vascular;  Laterality: Right;   LIGATION OF ARTERIOVENOUS  FISTULA Left 04/29/2024   Procedure: LIGATION OF ARTERIOVENOUS  FISTULA;  Surgeon: Jama Cordella MATSU, MD;  Location: [facility];  Service: Vascular;  Laterality: Left;   PERIPHERAL VASCULAR CATHETERIZATION N/A 04/17/2015   Procedure: Dialysis/Perma Catheter Insertion;  Surgeon: Cordella MATSU Jama, MD;  Location: [facility];  Service: Cardiovascular;  Laterality: N/A;   PERIPHERAL VASCULAR CATHETERIZATION Left 07/30/2015   Procedure: A/V Shuntogram/Fistulagram;  Surgeon: Selinda RAMAN Marea, MD;  Location: [facility];  Service: Cardiovascular;  Laterality: Left;   PERIPHERAL VASCULAR CATHETERIZATION Left 07/30/2015   Procedure: A/V Shunt Intervention;  Surgeon: Selinda RAMAN Marea, MD;  Location: [facility];  Service: Cardiovascular;  Laterality: Left;   PERIPHERAL VASCULAR CATHETERIZATION N/A 08/20/2015   Procedure: DIALYSIS/PERMA CATHETER REMOVAL;  Surgeon: Selinda RAMAN Marea, MD;  Location: [facility];  Service: Cardiovascular;  Laterality: N/A;   PERIPHERAL VASCULAR THROMBECTOMY Left 03/27/2022   Procedure: PERIPHERAL VASCULAR THROMBECTOMY;  Surgeon: Marea Selinda RAMAN, MD;  Location: [facility];  Service: Cardiovascular;  Laterality: Left;   PERIPHERAL VASCULAR THROMBECTOMY Left 08/04/2022   Procedure: PERIPHERAL VASCULAR THROMBECTOMY;  Surgeon: Marea Selinda RAMAN, MD;  Location: [facility];  Service: Cardiovascular;  Laterality: Left;   PERIPHERAL VASCULAR THROMBECTOMY Left 09/01/2023   Procedure: PERIPHERAL VASCULAR THROMBECTOMY;  Surgeon: Marea Selinda RAMAN, MD;  Location: [facility];  Service: Cardiovascular;  Laterality: Left;   REVISON OF  ARTERIOVENOUS FISTULA Left 12/04/2021   Procedure: REVISON OF ARTERIOVENOUS FISTULA;  Surgeon: Marea Selinda RAMAN, MD;  Location: [facility];  Service: Vascular;  Laterality: Left;   TEMPORARY DIALYSIS CATHETER N/A 06/11/2022   Procedure: TEMPORARY DIALYSIS CATHETER;  Surgeon: Jama Cordella MATSU, MD;  Location: [facility];  Service: Cardiovascular;  Laterality: N/A;   THROMBECTOMY W/ EMBOLECTOMY Left 12/04/2021   Procedure: THROMBECTOMY ARTERIOVENOUS FISTULA;  Surgeon: Marea Selinda RAMAN, MD;  Location: [facility];  Service: Vascular;  Laterality: Left;   VASCULAR SURGERY     Fistula placement    Family History  Problem Relation Age of Onset   Diabetes Mother    Kidney cancer Mother    Diabetes Sister    Stroke Brother    Prostate cancer Neg Hx    Bladder Cancer Neg Hx     Allergies[1]     Latest Ref Rng & Units 04/29/2024    7:12 AM 04/18/2024    9:01 AM 03/07/2024    5:12 PM  CBC  WBC 4.0 - 10.5 K/uL  4.8  6.3   Hemoglobin 13.0 - 17.0 g/dL 89.0  9.9  88.6   Hematocrit 39.0 - 52.0 % 32.0  30.6  35.3   Platelets 150 - 400 K/uL  88  140       CMP     Component Value Date/Time   NA 135 04/29/2024 0712   NA 140 03/21/2015 1512   K 3.6 04/29/2024 0712   K 4.0 03/21/2015 1512   CL 96 (L) 04/29/2024 0712   CL 95 (L) 03/21/2015 1512   CO2 26 04/18/2024 0901   CO2 36 (H) 03/21/2015 1512   GLUCOSE 103 (H) 04/29/2024 0712   GLUCOSE 117 (H) 03/21/2015 1512   BUN 23 04/29/2024 0712   BUN 64 (H) 03/21/2015 1512   CREATININE 5.40 (H) 04/29/2024 0712   CREATININE 7.71 (H) 03/21/2015 1512   CALCIUM  9.0 04/18/2024 0901   CALCIUM  8.5 (L) 03/21/2015 1512   PROT 6.8 08/01/2022 2129   PROT 5.8 (L) 12/13/2014 1354   ALBUMIN 3.2 (L) 02/27/2024 1635   ALBUMIN 2.5 (L) 12/13/2014 1354   AST 16 08/01/2022 2129   AST 17 12/13/2014 1354   ALT 14 08/01/2022 2129   ALT 21 12/13/2014 1354   ALKPHOS 149 (H) 08/01/2022 2129   ALKPHOS 50 12/13/2014 1354   BILITOT 0.8 08/01/2022 2129   BILITOT 0.2  12/13/2014 1354   GFRNONAA 5 (L) 04/18/2024 0901   GFRNONAA 7 (L) 03/21/2015 1512     No results found.     Assessment & Plan:   1. ESRD (end stage renal disease) (HCC) (Primary) Hemodialysis access management Scheduled for HERO graft to optimize dialysis access. Surgery postponed due to anticoagulation and weather. Arteriovenous fistula functioning well for procedure. Intraoperative evaluation to determine fistula flow and catheter necessity. - Reschedule HERO graft procedure, ensure anticoagulation discontinued 5 days prior. - Coordinate scheduling for Friday. - Use existing arteriovenous fistula and catheter. - Assess intraoperative fistula flow; forego new catheter if adequate. - Plan dialysis resumption via fistula day after surgery if flow sufficient.  2. Type 2 diabetes mellitus with hyperlipidemia (HCC) Continue hypoglycemic medications as already ordered, these medications have been reviewed and there are no changes at this time.  Hgb A1C to be monitored as already arranged by primary service  3. Essential hypertension Continue antihypertensive medications as already ordered, these medications have been reviewed and there are no changes at this time.   Assessment and Plan Assessment & Plan      Medications Ordered Prior to Encounter[2]  There are no Patient Instructions on file for this visit. No follow-ups on file.   Kimmora Risenhoover E Lusero Nordlund, NP      [1]  Allergies Allergen Reactions   Nsaids Other (See Comments)  Cannot take due to renal disease  [2]  Current Outpatient Medications on File Prior to Visit  Medication Sig Dispense Refill   acetaminophen  (TYLENOL ) 500 MG tablet Take 500 mg by mouth every 4 (four) hours as needed for moderate pain (pain score 4-6).     aspirin  EC 81 MG tablet Take 81 mg by mouth daily.      benztropine  (COGENTIN ) 0.5 MG tablet Take 0.5 mg by mouth 2 (two) times daily.     cetirizine (ZYRTEC) 10 MG tablet Take 10 mg by mouth  every other day.     Cholecalciferol  (VITAMIN D3) 125 MCG (5000 UT) CAPS Take 5,000 Units by mouth once a week.     clopidogrel  (PLAVIX ) 75 MG tablet Take 1 tablet (75 mg total) by mouth daily. 30 tablet 0   diphenhydrAMINE  (BENADRYL ) 25 mg capsule Take 25 mg by mouth See admin instructions. Take 1 capsule (25mg ) by mouth nightly as needed for sleep - may take 1 capsule (25mg ) by mouth during the daytime as needed for itching     docusate sodium  (COLACE) 100 MG capsule Take 100 mg by mouth 2 (two) times daily.     feeding supplement (BOOST HIGH PROTEIN) LIQD Take 1 Container by mouth See admin instructions. Take 1 container by mouth three times a week or as needed of Nutritional Supplement     fluticasone  (FLONASE ) 50 MCG/ACT nasal spray Place 2 sprays into both nostrils daily.     haloperidol  (HALDOL ) 10 MG tablet Take 5-10 mg by mouth See admin instructions. Take 5 mg in the morning and 10 mg at bedtime     mirtazapine  (REMERON ) 15 MG tablet Take 15 mg by mouth at bedtime.     niacin  (NIASPAN ) 1000 MG CR tablet Take 1,000 mg by mouth at bedtime.      pravastatin  (PRAVACHOL ) 40 MG tablet Take 40 mg by mouth at bedtime.      HYDROcodone -acetaminophen  (NORCO/VICODIN) 5-325 MG tablet Take 1 tablet by mouth every 6 (six) hours as needed for moderate pain (pain score 4-6) or severe pain (pain score 7-10). (Patient not taking: Reported on 12/29/2024) 20 tablet 0   No current facility-administered medications on file prior to visit.   "

## 2025-01-11 ENCOUNTER — Telehealth (INDEPENDENT_AMBULATORY_CARE_PROVIDER_SITE_OTHER): Payer: Self-pay

## 2025-01-11 NOTE — Telephone Encounter (Signed)
 Spoke with the patient's caregiver and the patient is scheduled for a right arm revision AV fistula on 01/25/25 with Dr. Jama at [facility]. Pre-admit will call to schedule pre-op  at the [facility]. Pre-surgical instructions were discussed and will be mailed and faxed to [facility].

## 2025-01-17 ENCOUNTER — Inpatient Hospital Stay: Admission: RE | Admit: 2025-01-17

## 2025-01-25 ENCOUNTER — Ambulatory Visit: Admit: 2025-01-25 | Admitting: Vascular Surgery

## 2025-02-16 ENCOUNTER — Other Ambulatory Visit

## 2025-02-17 ENCOUNTER — Other Ambulatory Visit

## 2025-02-20 ENCOUNTER — Ambulatory Visit: Admitting: Urology

## 2025-02-21 ENCOUNTER — Ambulatory Visit: Admitting: Urology
